# Patient Record
Sex: Male | Born: 1950
Health system: Southern US, Community
[De-identification: ages and names within clinical notes are randomized; demographics above are authoritative.]

## PROBLEM LIST (undated history)

## (undated) DIAGNOSIS — F329 Major depressive disorder, single episode, unspecified: Secondary | ICD-10-CM

## (undated) DIAGNOSIS — I10 Essential (primary) hypertension: Secondary | ICD-10-CM

## (undated) DIAGNOSIS — C61 Malignant neoplasm of prostate: Secondary | ICD-10-CM

## (undated) DIAGNOSIS — F32A Depression, unspecified: Secondary | ICD-10-CM

## (undated) DIAGNOSIS — E7800 Pure hypercholesterolemia, unspecified: Secondary | ICD-10-CM

## (undated) DIAGNOSIS — G35 Multiple sclerosis: Secondary | ICD-10-CM

## (undated) HISTORY — DX: Essential (primary) hypertension: I10

## (undated) HISTORY — DX: Multiple sclerosis: G35

## (undated) HISTORY — DX: Pure hypercholesterolemia, unspecified: E78.00

## (undated) HISTORY — PX: KNEE SURGERY: SHX244

## (undated) HISTORY — DX: Malignant neoplasm of prostate: C61

## (undated) HISTORY — DX: Major depressive disorder, single episode, unspecified: F32.9

## (undated) HISTORY — DX: Depression, unspecified: F32.A

---

## 2006-07-31 ENCOUNTER — Ambulatory Visit: Payer: Self-pay | Admitting: Unknown Physician Specialty

## 2006-10-16 ENCOUNTER — Ambulatory Visit: Payer: Self-pay | Admitting: Unknown Physician Specialty

## 2007-06-02 ENCOUNTER — Emergency Department: Payer: Self-pay | Admitting: Emergency Medicine

## 2009-02-14 ENCOUNTER — Ambulatory Visit: Payer: Self-pay

## 2009-05-04 ENCOUNTER — Ambulatory Visit: Payer: Self-pay | Admitting: Urology

## 2009-05-24 ENCOUNTER — Ambulatory Visit: Payer: Self-pay | Admitting: Radiation Oncology

## 2009-06-10 ENCOUNTER — Ambulatory Visit: Payer: Self-pay | Admitting: Radiation Oncology

## 2009-06-24 ENCOUNTER — Ambulatory Visit: Payer: Self-pay | Admitting: Radiation Oncology

## 2009-07-05 ENCOUNTER — Ambulatory Visit: Payer: Self-pay | Admitting: Radiation Oncology

## 2009-07-12 ENCOUNTER — Ambulatory Visit: Payer: Self-pay | Admitting: Radiation Oncology

## 2009-07-24 ENCOUNTER — Ambulatory Visit: Payer: Self-pay | Admitting: Radiation Oncology

## 2009-07-26 ENCOUNTER — Ambulatory Visit: Payer: Self-pay | Admitting: Urology

## 2009-08-24 ENCOUNTER — Ambulatory Visit: Payer: Self-pay | Admitting: Radiation Oncology

## 2009-09-23 ENCOUNTER — Ambulatory Visit: Payer: Self-pay | Admitting: Radiation Oncology

## 2010-06-28 ENCOUNTER — Ambulatory Visit: Payer: Self-pay | Admitting: Internal Medicine

## 2010-12-07 DIAGNOSIS — M21969 Unspecified acquired deformity of unspecified lower leg: Secondary | ICD-10-CM | POA: Insufficient documentation

## 2011-03-01 ENCOUNTER — Ambulatory Visit: Payer: Self-pay | Admitting: Internal Medicine

## 2011-03-28 ENCOUNTER — Ambulatory Visit: Payer: Self-pay | Admitting: Internal Medicine

## 2011-03-28 DIAGNOSIS — C61 Malignant neoplasm of prostate: Secondary | ICD-10-CM | POA: Diagnosis not present

## 2011-04-04 ENCOUNTER — Ambulatory Visit: Payer: Self-pay | Admitting: Oncology

## 2011-04-10 ENCOUNTER — Ambulatory Visit: Payer: Self-pay | Admitting: Oncology

## 2011-04-10 DIAGNOSIS — M899 Disorder of bone, unspecified: Secondary | ICD-10-CM | POA: Diagnosis not present

## 2011-04-10 DIAGNOSIS — F329 Major depressive disorder, single episode, unspecified: Secondary | ICD-10-CM | POA: Diagnosis not present

## 2011-04-10 DIAGNOSIS — G35 Multiple sclerosis: Secondary | ICD-10-CM | POA: Diagnosis not present

## 2011-04-10 DIAGNOSIS — Z79899 Other long term (current) drug therapy: Secondary | ICD-10-CM | POA: Diagnosis not present

## 2011-04-10 DIAGNOSIS — I10 Essential (primary) hypertension: Secondary | ICD-10-CM | POA: Diagnosis not present

## 2011-04-10 DIAGNOSIS — Z8546 Personal history of malignant neoplasm of prostate: Secondary | ICD-10-CM | POA: Diagnosis not present

## 2011-04-10 LAB — COMPREHENSIVE METABOLIC PANEL
Alkaline Phosphatase: 101 U/L (ref 50–136)
Anion Gap: 7 (ref 7–16)
Bilirubin,Total: 0.5 mg/dL (ref 0.2–1.0)
Calcium, Total: 9.2 mg/dL (ref 8.5–10.1)
Chloride: 101 mmol/L (ref 98–107)
Co2: 31 mmol/L (ref 21–32)
Creatinine: 1.33 mg/dL — ABNORMAL HIGH (ref 0.60–1.30)
EGFR (African American): >60
EGFR (Non-African Amer.): 58 — ABNORMAL LOW
Glucose: 87 mg/dL (ref 65–99)
Osmolality: 277 (ref 275–301)
Sodium: 139 mmol/L (ref 136–145)
Total Protein: 7.1 g/dL (ref 6.4–8.2)

## 2011-04-10 LAB — CBC CANCER CENTER
Basophil #: 0 x10 3/mm (ref 0.0–0.1)
Eosinophil #: 0 x10 3/mm (ref 0.0–0.7)
Eosinophil %: 0.3 %
Lymphocyte #: 1 x10 3/mm (ref 1.0–3.6)
Lymphocyte %: 16.7 %
MCHC: 34.4 g/dL (ref 32.0–36.0)
MCV: 89 fL (ref 80–100)
Monocyte %: 10.7 %
Neutrophil %: 72.3 %
Platelet: 230 x10 3/mm (ref 150–440)
RBC: 4.78 10*6/uL (ref 4.40–5.90)
RDW: 13 % (ref 11.5–14.5)
WBC: 5.8 x10 3/mm (ref 3.8–10.6)

## 2011-04-11 LAB — PSA: PSA: 2.6 ng/mL (ref 0.0–4.0)

## 2011-04-27 ENCOUNTER — Ambulatory Visit: Payer: Self-pay | Admitting: Oncology

## 2011-05-02 DIAGNOSIS — E7800 Pure hypercholesterolemia, unspecified: Secondary | ICD-10-CM | POA: Diagnosis not present

## 2011-05-02 DIAGNOSIS — I10 Essential (primary) hypertension: Secondary | ICD-10-CM | POA: Diagnosis not present

## 2011-05-02 DIAGNOSIS — R634 Abnormal weight loss: Secondary | ICD-10-CM | POA: Diagnosis not present

## 2011-05-02 DIAGNOSIS — R5381 Other malaise: Secondary | ICD-10-CM | POA: Diagnosis not present

## 2011-05-02 DIAGNOSIS — D510 Vitamin B12 deficiency anemia due to intrinsic factor deficiency: Secondary | ICD-10-CM | POA: Diagnosis not present

## 2011-06-04 DIAGNOSIS — C61 Malignant neoplasm of prostate: Secondary | ICD-10-CM | POA: Diagnosis not present

## 2011-06-04 DIAGNOSIS — R339 Retention of urine, unspecified: Secondary | ICD-10-CM | POA: Diagnosis not present

## 2011-06-04 DIAGNOSIS — N318 Other neuromuscular dysfunction of bladder: Secondary | ICD-10-CM | POA: Diagnosis not present

## 2011-06-04 DIAGNOSIS — N139 Obstructive and reflux uropathy, unspecified: Secondary | ICD-10-CM | POA: Diagnosis not present

## 2011-08-15 ENCOUNTER — Ambulatory Visit: Payer: Self-pay | Admitting: Oncology

## 2011-08-15 DIAGNOSIS — Z8546 Personal history of malignant neoplasm of prostate: Secondary | ICD-10-CM | POA: Diagnosis not present

## 2011-08-15 DIAGNOSIS — Z79899 Other long term (current) drug therapy: Secondary | ICD-10-CM | POA: Diagnosis not present

## 2011-08-15 DIAGNOSIS — G35 Multiple sclerosis: Secondary | ICD-10-CM | POA: Diagnosis not present

## 2011-08-15 DIAGNOSIS — M899 Disorder of bone, unspecified: Secondary | ICD-10-CM | POA: Diagnosis not present

## 2011-08-15 DIAGNOSIS — M949 Disorder of cartilage, unspecified: Secondary | ICD-10-CM | POA: Diagnosis not present

## 2011-08-15 DIAGNOSIS — F329 Major depressive disorder, single episode, unspecified: Secondary | ICD-10-CM | POA: Diagnosis not present

## 2011-08-15 DIAGNOSIS — I10 Essential (primary) hypertension: Secondary | ICD-10-CM | POA: Diagnosis not present

## 2011-08-15 LAB — CBC CANCER CENTER
Basophil %: 0.5 %
Eosinophil %: 0.3 %
HCT: 42.8 % (ref 40.0–52.0)
HGB: 14.2 g/dL (ref 13.0–18.0)
Lymphocyte #: 0.8 x10 3/mm — ABNORMAL LOW (ref 1.0–3.6)
MCHC: 33.1 g/dL (ref 32.0–36.0)
MCV: 90 fL (ref 80–100)
Monocyte #: 0.6 x10 3/mm (ref 0.2–1.0)
Monocyte %: 9.8 %
Neutrophil #: 4.2 x10 3/mm (ref 1.4–6.5)
Neutrophil %: 74.7 %
Platelet: 220 x10 3/mm (ref 150–440)
RBC: 4.76 10*6/uL (ref 4.40–5.90)
RDW: 13 % (ref 11.5–14.5)
WBC: 5.7 x10 3/mm (ref 3.8–10.6)

## 2011-08-15 LAB — COMPREHENSIVE METABOLIC PANEL
Albumin: 3.5 g/dL (ref 3.4–5.0)
Alkaline Phosphatase: 92 U/L (ref 50–136)
Anion Gap: 9 (ref 7–16)
BUN: 14 mg/dL (ref 7–18)
Bilirubin,Total: 0.4 mg/dL (ref 0.2–1.0)
Calcium, Total: 9 mg/dL (ref 8.5–10.1)
Co2: 30 mmol/L (ref 21–32)
EGFR (African American): >60
Glucose: 69 mg/dL (ref 65–99)
Osmolality: 282 (ref 275–301)
Potassium: 3.3 mmol/L — ABNORMAL LOW (ref 3.5–5.1)
SGPT (ALT): 20 U/L
Sodium: 142 mmol/L (ref 136–145)
Total Protein: 7.1 g/dL (ref 6.4–8.2)

## 2011-08-16 LAB — PSA: PSA: 1.3 ng/mL (ref 0.0–4.0)

## 2011-08-22 DIAGNOSIS — Z79899 Other long term (current) drug therapy: Secondary | ICD-10-CM | POA: Diagnosis not present

## 2011-08-22 DIAGNOSIS — E7800 Pure hypercholesterolemia, unspecified: Secondary | ICD-10-CM | POA: Diagnosis not present

## 2011-08-22 DIAGNOSIS — I10 Essential (primary) hypertension: Secondary | ICD-10-CM | POA: Diagnosis not present

## 2011-08-25 ENCOUNTER — Ambulatory Visit: Payer: Self-pay | Admitting: Oncology

## 2011-08-29 DIAGNOSIS — E7800 Pure hypercholesterolemia, unspecified: Secondary | ICD-10-CM | POA: Diagnosis not present

## 2011-08-29 DIAGNOSIS — D510 Vitamin B12 deficiency anemia due to intrinsic factor deficiency: Secondary | ICD-10-CM | POA: Diagnosis not present

## 2011-08-29 DIAGNOSIS — I10 Essential (primary) hypertension: Secondary | ICD-10-CM | POA: Diagnosis not present

## 2011-10-09 ENCOUNTER — Emergency Department: Payer: Self-pay | Admitting: Internal Medicine

## 2011-10-09 DIAGNOSIS — R5383 Other fatigue: Secondary | ICD-10-CM | POA: Diagnosis not present

## 2011-10-09 DIAGNOSIS — T6591XA Toxic effect of unspecified substance, accidental (unintentional), initial encounter: Secondary | ICD-10-CM | POA: Diagnosis not present

## 2011-10-09 DIAGNOSIS — Z79899 Other long term (current) drug therapy: Secondary | ICD-10-CM | POA: Diagnosis not present

## 2011-10-09 DIAGNOSIS — I10 Essential (primary) hypertension: Secondary | ICD-10-CM | POA: Diagnosis not present

## 2011-10-09 DIAGNOSIS — G35 Multiple sclerosis: Secondary | ICD-10-CM | POA: Diagnosis not present

## 2011-10-09 DIAGNOSIS — R4789 Other speech disturbances: Secondary | ICD-10-CM | POA: Diagnosis not present

## 2011-10-09 DIAGNOSIS — R6889 Other general symptoms and signs: Secondary | ICD-10-CM | POA: Diagnosis not present

## 2011-10-09 DIAGNOSIS — T887XXA Unspecified adverse effect of drug or medicament, initial encounter: Secondary | ICD-10-CM | POA: Diagnosis not present

## 2011-10-09 LAB — URINALYSIS, COMPLETE
Bilirubin,UR: NEGATIVE
Blood: NEGATIVE
Glucose,UR: NEGATIVE mg/dL (ref 0–75)
Ketone: NEGATIVE
Nitrite: NEGATIVE
Ph: 7 (ref 4.5–8.0)
Specific Gravity: 1.016 (ref 1.003–1.030)
Squamous Epithelial: <1
WBC UR: <1 /HPF (ref 0–5)

## 2011-10-09 LAB — COMPREHENSIVE METABOLIC PANEL
Anion Gap: 7 (ref 7–16)
BUN: 12 mg/dL (ref 7–18)
Bilirubin,Total: 0.6 mg/dL (ref 0.2–1.0)
Calcium, Total: 9 mg/dL (ref 8.5–10.1)
Chloride: 101 mmol/L (ref 98–107)
Co2: 31 mmol/L (ref 21–32)
Creatinine: 1.2 mg/dL (ref 0.60–1.30)
Glucose: 85 mg/dL (ref 65–99)
Osmolality: 277 (ref 275–301)
Potassium: 3.9 mmol/L (ref 3.5–5.1)
Sodium: 139 mmol/L (ref 136–145)

## 2011-10-09 LAB — CBC
MCH: 32.5 pg (ref 26.0–34.0)
MCV: 93 fL (ref 80–100)
Platelet: 226 10*3/uL (ref 150–440)
RDW: 13.2 % (ref 11.5–14.5)
WBC: 5.1 10*3/uL (ref 3.8–10.6)

## 2011-10-09 LAB — DRUG SCREEN, URINE
Amphetamines, Ur Screen: NEGATIVE (ref ?–1000)
Barbiturates, Ur Screen: NEGATIVE (ref ?–200)
Cannabinoid 50 Ng, Ur ~~LOC~~: NEGATIVE (ref ?–50)
Cocaine Metabolite,Ur ~~LOC~~: NEGATIVE (ref ?–300)
Methadone, Ur Screen: NEGATIVE (ref ?–300)
Opiate, Ur Screen: NEGATIVE (ref ?–300)
Tricyclic, Ur Screen: NEGATIVE (ref ?–1000)

## 2011-10-09 LAB — CK TOTAL AND CKMB (NOT AT ARMC)
CK, Total: 107 U/L (ref 35–232)
CK-MB: 1.8 ng/mL (ref 0.5–3.6)

## 2011-10-09 LAB — TROPONIN I: Troponin-I: <0.02 ng/mL

## 2011-10-10 DIAGNOSIS — R339 Retention of urine, unspecified: Secondary | ICD-10-CM | POA: Diagnosis not present

## 2011-10-10 DIAGNOSIS — G35 Multiple sclerosis: Secondary | ICD-10-CM | POA: Diagnosis not present

## 2011-10-10 DIAGNOSIS — N318 Other neuromuscular dysfunction of bladder: Secondary | ICD-10-CM | POA: Diagnosis not present

## 2011-10-10 DIAGNOSIS — C61 Malignant neoplasm of prostate: Secondary | ICD-10-CM | POA: Diagnosis not present

## 2011-11-29 DIAGNOSIS — G35 Multiple sclerosis: Secondary | ICD-10-CM | POA: Diagnosis not present

## 2011-12-28 ENCOUNTER — Encounter: Payer: Self-pay | Admitting: Internal Medicine

## 2011-12-28 ENCOUNTER — Ambulatory Visit (INDEPENDENT_AMBULATORY_CARE_PROVIDER_SITE_OTHER): Payer: Medicare Other | Admitting: Internal Medicine

## 2011-12-28 VITALS — BP 141/88 | HR 80 | Temp 97.8°F | Ht 68.5 in | Wt 139.8 lb

## 2011-12-28 DIAGNOSIS — I10 Essential (primary) hypertension: Secondary | ICD-10-CM | POA: Diagnosis not present

## 2011-12-28 DIAGNOSIS — Z23 Encounter for immunization: Secondary | ICD-10-CM | POA: Diagnosis not present

## 2011-12-28 DIAGNOSIS — E7800 Pure hypercholesterolemia, unspecified: Secondary | ICD-10-CM | POA: Diagnosis not present

## 2011-12-28 DIAGNOSIS — C61 Malignant neoplasm of prostate: Secondary | ICD-10-CM | POA: Diagnosis not present

## 2011-12-28 DIAGNOSIS — E538 Deficiency of other specified B group vitamins: Secondary | ICD-10-CM | POA: Diagnosis not present

## 2011-12-28 DIAGNOSIS — G35 Multiple sclerosis: Secondary | ICD-10-CM | POA: Insufficient documentation

## 2011-12-28 MED ORDER — CYANOCOBALAMIN 1000 MCG/ML IJ SOLN
1000.0000 ug | Freq: Once | INTRAMUSCULAR | Status: AC
  Administered 2011-12-28: 1000 ug via INTRAMUSCULAR

## 2011-12-28 NOTE — Assessment & Plan Note (Signed)
Continues monthly B12 injections.    

## 2011-12-28 NOTE — Patient Instructions (Signed)
It was good seeing you today.  Blood pressure on recheck - looked great.  We will schedule your follow up labs with your next B12 shot.

## 2011-12-28 NOTE — Assessment & Plan Note (Signed)
Blood pressure on recheck - looks good.  Continue same meds.  Will check met b with his next labs.    

## 2011-12-28 NOTE — Assessment & Plan Note (Signed)
Sees Dr Emily Pharr.  Continues with physical therapy.  

## 2011-12-28 NOTE — Assessment & Plan Note (Signed)
Followed by Dr Achilles Dunk and Dr Doylene Canning.  Dr Achilles Dunk follows his PSA.  Has follow up this month.

## 2011-12-28 NOTE — Assessment & Plan Note (Signed)
Continue Zetia.  Unable to take statins.  Low cholesterol diet.  Check lipid profile and liver panel - next labs.   

## 2011-12-28 NOTE — Progress Notes (Signed)
  Subjective:    Patient ID: Philip Gordon, male    DOB: 22-May-1950, 61 y.o.   MRN: [id number]  HPI 61 year old male with past history of multiple sclerosis, hypertension and hypercholesterolemia.  He comes in today for a scheduled follow up.  He has been doing relatively well.  Eating better.  He reports his weight has increased.  Has some issues with right posterior shoulder pain.  Seeing neurology and going to PT.  Has muscle relaxer - takes regularly.  Never started the Prozac that his neurologist prescribed.  Desires not to start.  States she prescribed this for his pain.  He plans to discuss this with her at his next appt.    Past Medical History  Diagnosis Date  . Hypertension   . Hypercholesterolemia   . Multiple sclerosis     Sees Dr Harlen [facility])  . Prostate cancer     Followed by Dr Achilles Dunk and Dr Doylene Canning  . Depression     Review of Systems Patient denies any headache, lightheadedness or dizziness.  No chest pain, tightness or palpatations. No increased shortness of breath, cough or congestion.  No nausea or vomiting.  No abdominal pain or cramping.  No bowel change, such as diarrhea, constipation, BRBPR or melana.  No urine change. Only drinking one to two beers per week now.  Feels better.        Objective:   Physical Exam Filed Vitals:   12/28/11 1318  BP: 141/88  Pulse: 80  Temp: 97.8 F (36.6 C)   Blood pressure recheck:  105/13  61 year old male in no acute distress.   HEENT:  Nares - clear.  OP- without lesions or erythema.  NECK:  Supple, nontender.  No audible carotid bruit.   HEART:  Appears to be regular. LUNGS:  Without crackles or wheezing audible.  Respirations even and unlabored.   RADIAL PULSE:  Equal bilaterally.  ABDOMEN:  Soft, nontender.   EXTREMITIES:  No increased edema to be present.                    Assessment & Plan:

## 2012-01-02 ENCOUNTER — Ambulatory Visit: Payer: Self-pay | Admitting: Internal Medicine

## 2012-01-07 ENCOUNTER — Ambulatory Visit: Payer: Self-pay | Admitting: Unknown Physician Specialty

## 2012-01-07 DIAGNOSIS — Z8546 Personal history of malignant neoplasm of prostate: Secondary | ICD-10-CM | POA: Diagnosis not present

## 2012-01-07 DIAGNOSIS — F17200 Nicotine dependence, unspecified, uncomplicated: Secondary | ICD-10-CM | POA: Diagnosis not present

## 2012-01-07 DIAGNOSIS — Z860100 Personal history of colon polyps, unspecified: Secondary | ICD-10-CM | POA: Diagnosis not present

## 2012-01-07 DIAGNOSIS — D126 Benign neoplasm of colon, unspecified: Secondary | ICD-10-CM | POA: Diagnosis not present

## 2012-01-07 DIAGNOSIS — I10 Essential (primary) hypertension: Secondary | ICD-10-CM | POA: Diagnosis not present

## 2012-01-07 DIAGNOSIS — Z09 Encounter for follow-up examination after completed treatment for conditions other than malignant neoplasm: Secondary | ICD-10-CM | POA: Diagnosis not present

## 2012-01-07 DIAGNOSIS — Z79899 Other long term (current) drug therapy: Secondary | ICD-10-CM | POA: Diagnosis not present

## 2012-01-07 DIAGNOSIS — K648 Other hemorrhoids: Secondary | ICD-10-CM | POA: Diagnosis not present

## 2012-01-09 LAB — PATHOLOGY REPORT

## 2012-01-16 DIAGNOSIS — G35 Multiple sclerosis: Secondary | ICD-10-CM | POA: Insufficient documentation

## 2012-01-16 DIAGNOSIS — C61 Malignant neoplasm of prostate: Secondary | ICD-10-CM | POA: Insufficient documentation

## 2012-01-16 DIAGNOSIS — Z8546 Personal history of malignant neoplasm of prostate: Secondary | ICD-10-CM | POA: Insufficient documentation

## 2012-01-16 DIAGNOSIS — R339 Retention of urine, unspecified: Secondary | ICD-10-CM | POA: Insufficient documentation

## 2012-01-16 DIAGNOSIS — N319 Neuromuscular dysfunction of bladder, unspecified: Secondary | ICD-10-CM | POA: Insufficient documentation

## 2012-03-12 DIAGNOSIS — Z85828 Personal history of other malignant neoplasm of skin: Secondary | ICD-10-CM | POA: Diagnosis not present

## 2012-03-12 DIAGNOSIS — L723 Sebaceous cyst: Secondary | ICD-10-CM | POA: Diagnosis not present

## 2012-03-12 DIAGNOSIS — D485 Neoplasm of uncertain behavior of skin: Secondary | ICD-10-CM | POA: Diagnosis not present

## 2012-03-31 DIAGNOSIS — L570 Actinic keratosis: Secondary | ICD-10-CM | POA: Diagnosis not present

## 2012-03-31 DIAGNOSIS — L723 Sebaceous cyst: Secondary | ICD-10-CM | POA: Diagnosis not present

## 2012-03-31 DIAGNOSIS — Z0189 Encounter for other specified special examinations: Secondary | ICD-10-CM | POA: Diagnosis not present

## 2012-03-31 DIAGNOSIS — L819 Disorder of pigmentation, unspecified: Secondary | ICD-10-CM | POA: Diagnosis not present

## 2012-03-31 DIAGNOSIS — L821 Other seborrheic keratosis: Secondary | ICD-10-CM | POA: Diagnosis not present

## 2012-04-16 DIAGNOSIS — N139 Obstructive and reflux uropathy, unspecified: Secondary | ICD-10-CM | POA: Diagnosis not present

## 2012-04-16 DIAGNOSIS — N318 Other neuromuscular dysfunction of bladder: Secondary | ICD-10-CM | POA: Diagnosis not present

## 2012-04-16 DIAGNOSIS — C61 Malignant neoplasm of prostate: Secondary | ICD-10-CM | POA: Diagnosis not present

## 2012-04-16 DIAGNOSIS — R339 Retention of urine, unspecified: Secondary | ICD-10-CM | POA: Diagnosis not present

## 2012-04-28 ENCOUNTER — Other Ambulatory Visit: Payer: Self-pay | Admitting: Internal Medicine

## 2012-04-28 MED ORDER — EZETIMIBE 10 MG PO TABS: 10.0000 mg | ORAL_TABLET | Freq: Every day | ORAL | Status: DC

## 2012-04-28 MED ORDER — AMLODIPINE BESYLATE 5 MG PO TABS: 5.0000 mg | ORAL_TABLET | Freq: Every day | ORAL | Status: DC

## 2012-04-28 MED ORDER — HYDROCHLOROTHIAZIDE 12.5 MG PO CAPS: 12.5000 mg | ORAL_CAPSULE | Freq: Every day | ORAL | Status: DC

## 2012-04-28 NOTE — Telephone Encounter (Signed)
Sent in to pharmacy.  

## 2012-04-28 NOTE — Telephone Encounter (Signed)
Pt is needing refill on Amlodipine, Zetia and Hydrochlorot. He uses [employer] 3 month supply

## 2012-04-29 ENCOUNTER — Ambulatory Visit: Payer: Medicare Other | Admitting: Internal Medicine

## 2012-04-30 ENCOUNTER — Other Ambulatory Visit: Payer: Self-pay | Admitting: *Deleted

## 2012-04-30 NOTE — Telephone Encounter (Signed)
Refill Request  Hydrochlorot tab 12.5mg   Take 1 tablet once daily

## 2012-05-01 NOTE — Telephone Encounter (Signed)
Already sent in to pharmacy. 

## 2012-05-06 ENCOUNTER — Telehealth: Payer: Self-pay | Admitting: Internal Medicine

## 2012-05-06 NOTE — Telephone Encounter (Signed)
Pt would like to get meds today if possible

## 2012-05-06 NOTE — Telephone Encounter (Signed)
Pt called he would like the [facility]lopine [facility] to [facility] [address]  He needs enough till the mail order comes in.  Pt has 3 days left

## 2012-05-07 MED ORDER — AMLODIPINE BESYLATE 5 MG PO TABS: 5.0000 mg | ORAL_TABLET | Freq: Every day | ORAL | Status: DC

## 2012-05-07 NOTE — Telephone Encounter (Signed)
Sent in to pharmacy.  

## 2012-05-08 ENCOUNTER — Ambulatory Visit: Payer: Medicare Other | Admitting: Internal Medicine

## 2012-05-15 ENCOUNTER — Ambulatory Visit (INDEPENDENT_AMBULATORY_CARE_PROVIDER_SITE_OTHER): Payer: Medicare Other | Admitting: Internal Medicine

## 2012-05-15 ENCOUNTER — Encounter: Payer: Self-pay | Admitting: Internal Medicine

## 2012-05-15 VITALS — BP 140/80 | HR 59 | Temp 98.1°F | Ht 68.5 in | Wt 137.0 lb

## 2012-05-15 DIAGNOSIS — E538 Deficiency of other specified B group vitamins: Secondary | ICD-10-CM | POA: Diagnosis not present

## 2012-05-15 DIAGNOSIS — I10 Essential (primary) hypertension: Secondary | ICD-10-CM | POA: Diagnosis not present

## 2012-05-15 DIAGNOSIS — E7800 Pure hypercholesterolemia, unspecified: Secondary | ICD-10-CM | POA: Diagnosis not present

## 2012-05-15 DIAGNOSIS — C61 Malignant neoplasm of prostate: Secondary | ICD-10-CM

## 2012-05-15 DIAGNOSIS — G35 Multiple sclerosis: Secondary | ICD-10-CM

## 2012-05-15 LAB — LIPID PANEL
Cholesterol: 214 mg/dL — ABNORMAL HIGH (ref 0–200)
HDL: 56.4 mg/dL (ref >39.00)
Triglycerides: 132 mg/dL (ref 0.0–149.0)

## 2012-05-15 LAB — HEPATIC FUNCTION PANEL
ALT: 14 U/L (ref 0–53)
Albumin: 4.1 g/dL (ref 3.5–5.2)
Total Protein: 7.3 g/dL (ref 6.0–8.3)

## 2012-05-15 MED ORDER — AMLODIPINE BESYLATE 5 MG PO TABS: 5.0000 mg | ORAL_TABLET | Freq: Every day | ORAL | Status: DC

## 2012-05-16 ENCOUNTER — Encounter: Payer: Self-pay | Admitting: Internal Medicine

## 2012-05-16 LAB — BASIC METABOLIC PANEL WITH GFR
CO2: 28 mEq/L (ref 19–32)
Calcium: 9.5 mg/dL (ref 8.4–10.5)
Creat: 1.06 mg/dL (ref 0.50–1.35)
GFR, Est African American: 86 mL/min
Glucose, Bld: 76 mg/dL (ref 70–99)

## 2012-05-25 ENCOUNTER — Encounter: Payer: Self-pay | Admitting: Internal Medicine

## 2012-05-25 NOTE — Assessment & Plan Note (Signed)
Continues monthly B12 injections.

## 2012-05-25 NOTE — Assessment & Plan Note (Signed)
Blood pressure on recheck - looks good.  Continue same meds.  Will check met b with his next labs.

## 2012-05-25 NOTE — Assessment & Plan Note (Signed)
Sees Dr [facility].  Continues with physical therapy.

## 2012-05-25 NOTE — Assessment & Plan Note (Signed)
Continue Zetia.  Unable to take statins.  Low cholesterol diet.  Check lipid profile and liver panel - next labs.   

## 2012-05-25 NOTE — Assessment & Plan Note (Signed)
Followed by Dr Achilles Dunk and Dr Doylene Canning.  Dr Achilles Dunk follows his PSA.  Last psa .3.

## 2012-05-25 NOTE — Progress Notes (Signed)
  Subjective:    Patient ID: Philip Gordon, male    DOB: November 21, 1950, 62 y.o.   MRN: [id number]  HPI 62 year old male with past history of multiple sclerosis, hypertension and hypercholesterolemia.  He comes in today for a scheduled follow up.  He has been doing relatively well.  Eating better.  He reports his weight has increased.  Has some issues with right posterior shoulder pain.  Seeing neurology and going to PT.  Has muscle relaxer - takes regularly.  Seeing Dr Achilles Dunk.  PSA .3.  Breathing stable.  No drinking.  Bowels stable.    Past Medical History  Diagnosis Date  . Hypertension   . Hypercholesterolemia   . Multiple sclerosis     Sees Dr [facility]len [facility])  . Prostate cancer     Followed by Dr Achilles Dunk and Dr Doylene Canning  . Depression     Current Outpatient Prescriptions on File Prior to Visit  Medication Sig Dispense Refill  . ALPRAZolam (XANAX) 0.5 MG tablet Take 0.5 mg by mouth at bedtime as needed.      . dalfampridine (AMPYRA) 10 MG TB12 Take 10 mg by mouth 2 (two) times daily.       Marland Kitchen ezetimibe (ZETIA) 10 MG tablet Take 1 tablet (10 mg total) by mouth daily.  90 tablet  1  . finasteride (PROSCAR) 5 MG tablet Take 5 mg by mouth daily.      . hydrochlorothiazide (MICROZIDE) 12.5 MG capsule Take 1 capsule (12.5 mg total) by mouth daily.  90 capsule  1  . tiZANidine (ZANAFLEX) 4 MG capsule Take 4 mg by mouth at bedtime.       Marland Kitchen FLUoxetine (PROZAC) 20 MG tablet Take 20 mg by mouth daily.       No current facility-administered medications on file prior to visit.    Review of Systems Patient denies any headache, lightheadedness or dizziness.  No sinus or allergy symptoms.  No chest pain, tightness or palpitations. No increased shortness of breath, cough or congestion.  No nausea or vomiting.  No acid reflux. No abdominal pain or cramping.  No bowel change, such as diarrhea, constipation, BRBPR or melana.  No urine change.   Feels better.        Objective:   Physical Exam  Filed  Vitals:   05/15/12 1111  BP: 140/80  Pulse: 59  Temp: 98.1 F (36.7 C)   Blood pressure recheck:  98/1  62 year old male in no acute distress.   HEENT:  Nares - clear.  OP- without lesions or erythema.  NECK:  Supple, nontender.  No audible carotid bruit.   HEART:  Appears to be regular. LUNGS:  Without crackles or wheezing audible.  Respirations even and unlabored.   RADIAL PULSE:  Equal bilaterally.  ABDOMEN:  Soft, nontender.   EXTREMITIES:  No increased edema to be present.                   Assessment & Plan:  HEALTH MAINTENANCE.  Need to get outside records.  Keep up to date with his physicals.  Prostate (psa) being followed by Dr Achilles Dunk.

## 2012-07-02 DIAGNOSIS — H2510 Age-related nuclear cataract, unspecified eye: Secondary | ICD-10-CM | POA: Diagnosis not present

## 2012-07-09 NOTE — Telephone Encounter (Signed)
Please close encounter

## 2012-07-16 ENCOUNTER — Telehealth: Payer: Self-pay | Admitting: Internal Medicine

## 2012-07-16 NOTE — Telephone Encounter (Signed)
Pt dropped of [employer] forms to be filled out  In box

## 2012-07-21 ENCOUNTER — Telehealth: Payer: Self-pay | Admitting: *Deleted

## 2012-07-21 NOTE — Telephone Encounter (Signed)
Left message for patient to pick up forms at the front desk (there is also a note on forms for pt to have Neurologist complete the remainder of the forms)

## 2012-07-24 ENCOUNTER — Ambulatory Visit (INDEPENDENT_AMBULATORY_CARE_PROVIDER_SITE_OTHER): Payer: Medicare Other | Admitting: *Deleted

## 2012-07-24 DIAGNOSIS — E538 Deficiency of other specified B group vitamins: Secondary | ICD-10-CM

## 2012-07-24 MED ORDER — CYANOCOBALAMIN 1000 MCG/ML IJ SOLN
1000.0000 ug | Freq: Once | INTRAMUSCULAR | Status: AC
  Administered 2012-07-24: 1000 ug via INTRAMUSCULAR

## 2012-07-30 DIAGNOSIS — G35 Multiple sclerosis: Secondary | ICD-10-CM | POA: Diagnosis not present

## 2012-10-15 DIAGNOSIS — N139 Obstructive and reflux uropathy, unspecified: Secondary | ICD-10-CM | POA: Diagnosis not present

## 2012-10-15 DIAGNOSIS — N320 Bladder-neck obstruction: Secondary | ICD-10-CM | POA: Insufficient documentation

## 2012-10-15 DIAGNOSIS — C61 Malignant neoplasm of prostate: Secondary | ICD-10-CM | POA: Diagnosis not present

## 2012-10-15 DIAGNOSIS — R339 Retention of urine, unspecified: Secondary | ICD-10-CM | POA: Diagnosis not present

## 2012-10-15 DIAGNOSIS — N318 Other neuromuscular dysfunction of bladder: Secondary | ICD-10-CM | POA: Diagnosis not present

## 2012-10-27 ENCOUNTER — Other Ambulatory Visit: Payer: Self-pay | Admitting: Internal Medicine

## 2012-11-13 ENCOUNTER — Encounter: Payer: Medicare Other | Admitting: Internal Medicine

## 2012-12-01 ENCOUNTER — Other Ambulatory Visit: Payer: Self-pay | Admitting: *Deleted

## 2012-12-01 MED ORDER — HYDROCHLOROTHIAZIDE 12.5 MG PO CAPS: 12.5000 mg | ORAL_CAPSULE | Freq: Every day | ORAL | Status: DC

## 2012-12-01 NOTE — Telephone Encounter (Signed)
Spoke with pt- [facility] to [facility] [address].

## 2012-12-29 ENCOUNTER — Encounter: Payer: Self-pay | Admitting: Internal Medicine

## 2012-12-29 ENCOUNTER — Ambulatory Visit (INDEPENDENT_AMBULATORY_CARE_PROVIDER_SITE_OTHER): Payer: Medicare Other | Admitting: Internal Medicine

## 2012-12-29 VITALS — BP 120/80 | HR 73 | Temp 97.7°F | Ht 69.0 in | Wt 131.5 lb

## 2012-12-29 DIAGNOSIS — G35 Multiple sclerosis: Secondary | ICD-10-CM

## 2012-12-29 DIAGNOSIS — Z23 Encounter for immunization: Secondary | ICD-10-CM

## 2012-12-29 DIAGNOSIS — C61 Malignant neoplasm of prostate: Secondary | ICD-10-CM | POA: Diagnosis not present

## 2012-12-29 DIAGNOSIS — R634 Abnormal weight loss: Secondary | ICD-10-CM

## 2012-12-29 DIAGNOSIS — E7800 Pure hypercholesterolemia, unspecified: Secondary | ICD-10-CM

## 2012-12-29 DIAGNOSIS — E538 Deficiency of other specified B group vitamins: Secondary | ICD-10-CM | POA: Diagnosis not present

## 2012-12-29 DIAGNOSIS — I10 Essential (primary) hypertension: Secondary | ICD-10-CM

## 2012-12-29 MED ORDER — CYANOCOBALAMIN 1000 MCG/ML IJ SOLN
1000.0000 ug | Freq: Once | INTRAMUSCULAR | Status: AC
  Administered 2012-12-29: 1000 ug via INTRAMUSCULAR

## 2012-12-31 ENCOUNTER — Encounter: Payer: Self-pay | Admitting: Internal Medicine

## 2012-12-31 NOTE — Assessment & Plan Note (Signed)
Continue Zetia.  Unable to take statins.  Low cholesterol diet.  Check lipid profile and liver panel - next labs.   

## 2012-12-31 NOTE — Assessment & Plan Note (Signed)
Sees Dr [facility].  Continues with physical therapy.

## 2012-12-31 NOTE — Assessment & Plan Note (Signed)
Followed by Dr Achilles Dunk and Dr Doylene Canning.  Dr Achilles Dunk follows his PSA.

## 2012-12-31 NOTE — Assessment & Plan Note (Signed)
Blood pressure as outlined.  Doing well.  Same medication regimen.  Follow.   

## 2012-12-31 NOTE — Assessment & Plan Note (Signed)
Continue B12 injections.  Given today.  Stressed he needs to come regularly.     

## 2012-12-31 NOTE — Progress Notes (Signed)
Subjective:    Patient ID: Philip Gordon, male    DOB: January 25, 1951, 62 y.o.   MRN: [id number]  HPI 62 year old male with past history of multiple sclerosis, hypertension and hypercholesterolemia.  He comes in today to follow up on these issues as well as for a complete physical exam.  He has been doing relatively well.  Weight is down more.  States he eats regular meals.  Do not feel he is eating as well since his mother passed away.   Has some issues with right posterior shoulder pain.  Seeing neurology and going to PT.  Has muscle relaxer - takes regularly.  Seeing Dr Achilles Dunk.  He is following him regarding his history of prostate cancer.  He is following psa.   Breathing stable.  Denies any alcohol intake.   Bowels stable.    Past Medical History  Diagnosis Date  . Hypertension   . Hypercholesterolemia   . Multiple sclerosis     Sees Dr Harlen [facility])  . Prostate cancer     Followed by Dr Achilles Dunk and Dr Doylene Canning  . Depression     Current Outpatient Prescriptions on File Prior to Visit  Medication Sig Dispense Refill  . ALPRAZolam (XANAX) 0.5 MG tablet Take 0.5 mg by mouth 3 (three) times daily as needed.       Marland Kitchen amLODipine (NORVASC) 5 MG tablet Take 1 tablet (5 mg total) by mouth daily.  14 tablet  0  . dalfampridine (AMPYRA) 10 MG TB12 Take 10 mg by mouth 2 (two) times daily.       . finasteride (PROSCAR) 5 MG tablet Take 5 mg by mouth daily.      . hydrochlorothiazide (MICROZIDE) 12.5 MG capsule Take 1 capsule (12.5 mg total) by mouth daily.  90 capsule  1  . tiZANidine (ZANAFLEX) 4 MG capsule Take 4 mg by mouth at bedtime.       Marland Kitchen ZETIA 10 MG tablet TAKE 1 TABLET DAILY  90 tablet  1  . cyanocobalamin (,VITAMIN B-12,) 1000 MCG/ML injection Inject 1,000 mcg into the muscle once.       No current facility-administered medications on file prior to visit.    Review of Systems Patient denies any headache, lightheadedness or dizziness.  No sinus or allergy symptoms.  No chest pain,  tightness or palpitations. No increased shortness of breath, cough or congestion.  No nausea or vomiting.  No acid reflux. No abdominal pain or cramping.  No bowel change, such as diarrhea, constipation, BRBPR or melana.  No urine change.   Feels overall things are stable.  Seeing neurology.          Objective:   Physical Exam  Filed Vitals:   12/29/12 1003  BP: 120/80  Pulse: 73  Temp: 97.7 F (36.5 C)   Blood pressure recheck:  118/78, pulse 22  62 year old male in no acute distress.  HEENT:  Nares - clear.  Oropharynx - without lesions. NECK:  Supple.  Nontender.  No audible carotid bruit.  HEART:  Appears to be regular.   LUNGS:  No crackles or wheezing audible.  Respirations even and unlabored.   RADIAL PULSE:  Equal bilaterally.  ABDOMEN:  Soft.  Nontender.  Bowel sounds present and normal.  No audible abdominal bruit.  GU:  Performed by urology.  EXTREMITIES:  No increased edema present.  DP pulses palpable and equal bilaterally.        Assessment & Plan:  HEALTH MAINTENANCE.  Physical today.  Needs colonoscopy.   Prostate (psa) being followed by Dr Achilles Dunk.

## 2013-01-05 ENCOUNTER — Encounter: Payer: Self-pay | Admitting: Internal Medicine

## 2013-01-06 ENCOUNTER — Encounter: Payer: Self-pay | Admitting: Internal Medicine

## 2013-01-12 ENCOUNTER — Encounter: Payer: Self-pay | Admitting: Internal Medicine

## 2013-01-30 ENCOUNTER — Ambulatory Visit (INDEPENDENT_AMBULATORY_CARE_PROVIDER_SITE_OTHER): Payer: Medicare Other | Admitting: *Deleted

## 2013-01-30 ENCOUNTER — Other Ambulatory Visit (INDEPENDENT_AMBULATORY_CARE_PROVIDER_SITE_OTHER): Payer: Medicare Other

## 2013-01-30 DIAGNOSIS — E7800 Pure hypercholesterolemia, unspecified: Secondary | ICD-10-CM | POA: Diagnosis not present

## 2013-01-30 DIAGNOSIS — I10 Essential (primary) hypertension: Secondary | ICD-10-CM | POA: Diagnosis not present

## 2013-01-30 DIAGNOSIS — E538 Deficiency of other specified B group vitamins: Secondary | ICD-10-CM

## 2013-01-30 DIAGNOSIS — R634 Abnormal weight loss: Secondary | ICD-10-CM

## 2013-01-30 LAB — LIPID PANEL
Cholesterol: 205 mg/dL — ABNORMAL HIGH (ref 0–200)
Total CHOL/HDL Ratio: 4
Triglycerides: 96 mg/dL (ref 0.0–149.0)
VLDL: 19.2 mg/dL (ref 0.0–40.0)

## 2013-01-30 LAB — CBC WITH DIFFERENTIAL/PLATELET
Basophils Absolute: 0 10*3/uL (ref 0.0–0.1)
Basophils Relative: 0.5 % (ref 0.0–3.0)
Eosinophils Absolute: 0 10*3/uL (ref 0.0–0.7)
Eosinophils Relative: 0.6 % (ref 0.0–5.0)
Hemoglobin: 15.5 g/dL (ref 13.0–17.0)
MCHC: 34.2 g/dL (ref 30.0–36.0)
MCV: 90.2 fl (ref 78.0–100.0)
Monocytes Absolute: 0.8 10*3/uL (ref 0.1–1.0)
Neutro Abs: 4.1 10*3/uL (ref 1.4–7.7)
Neutrophils Relative %: 69.1 % (ref 43.0–77.0)
RBC: 5.02 Mil/uL (ref 4.22–5.81)
RDW: 12.5 % (ref 11.5–14.6)
WBC: 5.9 10*3/uL (ref 4.5–10.5)

## 2013-01-30 LAB — BASIC METABOLIC PANEL
CO2: 32 mEq/L (ref 19–32)
Calcium: 9.5 mg/dL (ref 8.4–10.5)
Creatinine, Ser: 1.2 mg/dL (ref 0.4–1.5)
Glucose, Bld: 78 mg/dL (ref 70–99)
Potassium: 3.6 mEq/L (ref 3.5–5.1)
Sodium: 137 mEq/L (ref 135–145)

## 2013-01-30 LAB — HEPATIC FUNCTION PANEL
Alkaline Phosphatase: 70 U/L (ref 39–117)
Bilirubin, Direct: 0.1 mg/dL (ref 0.0–0.3)
Total Bilirubin: 0.9 mg/dL (ref 0.3–1.2)
Total Protein: 7 g/dL (ref 6.0–8.3)

## 2013-01-30 LAB — TSH: TSH: 1.78 u[IU]/mL (ref 0.35–5.50)

## 2013-01-30 LAB — LDL CHOLESTEROL, DIRECT: Direct LDL: 129.6 mg/dL

## 2013-01-30 MED ORDER — CYANOCOBALAMIN 1000 MCG/ML IJ SOLN
1000.0000 ug | Freq: Once | INTRAMUSCULAR | Status: AC
  Administered 2013-01-30: 1000 ug via INTRAMUSCULAR

## 2013-02-02 ENCOUNTER — Encounter: Payer: Self-pay | Admitting: *Deleted

## 2013-02-25 ENCOUNTER — Other Ambulatory Visit: Payer: Self-pay | Admitting: *Deleted

## 2013-02-25 MED ORDER — HYDROCHLOROTHIAZIDE 12.5 MG PO CAPS: 12.5000 mg | ORAL_CAPSULE | Freq: Every day | ORAL | Status: DC

## 2013-04-02 ENCOUNTER — Ambulatory Visit: Payer: Medicare Other | Admitting: Internal Medicine

## 2013-04-20 DIAGNOSIS — G35 Multiple sclerosis: Secondary | ICD-10-CM | POA: Diagnosis not present

## 2013-05-21 ENCOUNTER — Ambulatory Visit: Payer: Medicare Other | Admitting: Internal Medicine

## 2013-05-22 ENCOUNTER — Telehealth: Payer: Self-pay | Admitting: Emergency Medicine

## 2013-05-22 NOTE — Telephone Encounter (Signed)
Please schedule for Monday at 10:00 - block 63minutes.

## 2013-05-22 NOTE — Telephone Encounter (Signed)
Spoke with pt he needs afternoon  appointment

## 2013-05-22 NOTE — Telephone Encounter (Signed)
Pt was scheduled for f/u apt yesterday but we were out. Where can we place him without it being April?

## 2013-05-23 NOTE — Telephone Encounter (Signed)
I can see him on 06/23/13 at 11:45.

## 2013-05-25 ENCOUNTER — Ambulatory Visit: Payer: Medicare Other | Admitting: Internal Medicine

## 2013-05-25 NOTE — Telephone Encounter (Signed)
Conflict with 6/50 appointment  R/s to 07/06/13  Pt aware of appointment date and time change

## 2013-05-28 ENCOUNTER — Other Ambulatory Visit: Payer: Self-pay | Admitting: Internal Medicine

## 2013-06-23 ENCOUNTER — Ambulatory Visit: Payer: Medicare Other | Admitting: Internal Medicine

## 2013-06-23 DIAGNOSIS — N318 Other neuromuscular dysfunction of bladder: Secondary | ICD-10-CM | POA: Diagnosis not present

## 2013-06-23 DIAGNOSIS — G35 Multiple sclerosis: Secondary | ICD-10-CM | POA: Diagnosis not present

## 2013-06-23 DIAGNOSIS — R339 Retention of urine, unspecified: Secondary | ICD-10-CM | POA: Diagnosis not present

## 2013-06-23 DIAGNOSIS — C61 Malignant neoplasm of prostate: Secondary | ICD-10-CM | POA: Diagnosis not present

## 2013-06-30 ENCOUNTER — Telehealth: Payer: Self-pay | Admitting: Internal Medicine

## 2013-06-30 NOTE — Telephone Encounter (Signed)
Pt called asking for prescription refills.  Pt does not know specific medication names.  States he just needs all of them.  States he ran out of his last pill today.  States he needs 90 day supplies.  [facility] [address]

## 2013-07-01 NOTE — Telephone Encounter (Signed)
Not sure which medications needs refills. Pt will need to contact his pharmacy to see what refills are needed. I am showing that he has refills available on some of them. Patient must also keep his upcoming appointment for further refills. Left detailed voicemail on home phone

## 2013-07-02 DIAGNOSIS — H2510 Age-related nuclear cataract, unspecified eye: Secondary | ICD-10-CM | POA: Diagnosis not present

## 2013-07-06 ENCOUNTER — Encounter: Payer: Self-pay | Admitting: Internal Medicine

## 2013-07-06 ENCOUNTER — Ambulatory Visit (INDEPENDENT_AMBULATORY_CARE_PROVIDER_SITE_OTHER): Payer: Medicare Other | Admitting: Internal Medicine

## 2013-07-06 VITALS — BP 132/82 | HR 60 | Temp 97.7°F | Wt 140.5 lb

## 2013-07-06 DIAGNOSIS — E538 Deficiency of other specified B group vitamins: Secondary | ICD-10-CM

## 2013-07-06 DIAGNOSIS — E7800 Pure hypercholesterolemia, unspecified: Secondary | ICD-10-CM | POA: Diagnosis not present

## 2013-07-06 DIAGNOSIS — C61 Malignant neoplasm of prostate: Secondary | ICD-10-CM | POA: Diagnosis not present

## 2013-07-06 DIAGNOSIS — I10 Essential (primary) hypertension: Secondary | ICD-10-CM | POA: Diagnosis not present

## 2013-07-06 DIAGNOSIS — G35 Multiple sclerosis: Secondary | ICD-10-CM

## 2013-07-06 MED ORDER — CYANOCOBALAMIN 1000 MCG/ML IJ SOLN
1000.0000 ug | Freq: Once | INTRAMUSCULAR | Status: AC
  Administered 2013-07-06: 1000 ug via INTRAMUSCULAR

## 2013-07-06 MED ORDER — EZETIMIBE 10 MG PO TABS: 10.0000 mg | ORAL_TABLET | Freq: Every day | ORAL | Status: DC

## 2013-07-06 MED ORDER — AMLODIPINE BESYLATE 5 MG PO TABS: 5.0000 mg | ORAL_TABLET | Freq: Every day | ORAL | Status: DC

## 2013-07-06 NOTE — Progress Notes (Signed)
Pre visit review using our clinic review tool, if applicable. No additional management support is needed unless otherwise documented below in the visit note. 

## 2013-07-12 ENCOUNTER — Encounter: Payer: Self-pay | Admitting: Internal Medicine

## 2013-07-12 NOTE — Assessment & Plan Note (Signed)
Sees Dr Ala Bent.  Continues with physical therapy exercises.

## 2013-07-12 NOTE — Assessment & Plan Note (Signed)
Followed by Dr [name] and Dr [name].  Dr [name] follows his PSA.  Just checked and stable.

## 2013-07-12 NOTE — Assessment & Plan Note (Signed)
Continue B12 injections.  Given today.  Stressed he needs to come regularly.

## 2013-07-12 NOTE — Assessment & Plan Note (Signed)
Blood pressure as outlined.  Doing well.  Same medication regimen.  Follow.

## 2013-07-12 NOTE — Progress Notes (Signed)
Subjective:    Patient ID: Philip Gordon, male    DOB: Jan 08, 1951, 63 y.o.   MRN: [id number]  HPI 63 year old male with past history of multiple sclerosis, hypertension and hypercholesterolemia.  He comes in today to follow up on these issues as well as for a complete physical exam.  He has been doing relatively well.  Weight is up from last check.  States he eats regular meals.   Has some issues with right posterior shoulder pain.  Seeing neurology and going to PT.  Has muscle relaxer - takes regularly.  Seeing Dr [name].  He is following him regarding his history of prostate cancer.  He is following psa.   Just evaluated.  Stable.  Breathing stable.  Denies any alcohol intake.   Bowels stable.  Taking his medication regularly.     Past Medical History  Diagnosis Date  . Hypertension   . Hypercholesterolemia   . Multiple sclerosis     Sees Dr Ala Bent [facility])  . Prostate cancer     Followed by Dr [name] and Dr [name]  . Depression     Current Outpatient Prescriptions on File Prior to Visit  Medication Sig Dispense Refill  . ALPRAZolam (XANAX) 0.5 MG tablet Take 0.5 mg by mouth 3 (three) times daily as needed.       . cyanocobalamin (,VITAMIN B-12,) 1000 MCG/ML injection Inject 1,000 mcg into the muscle once.      Marland Kitchen dalfampridine (AMPYRA) 10 MG TB12 Take 10 mg by mouth 2 (two) times daily.       . finasteride (PROSCAR) 5 MG tablet Take 5 mg by mouth daily.      . hydrochlorothiazide (MICROZIDE) 12.5 MG capsule TAKE 1 CAPSULE (12.5 MG TOTAL) BY MOUTH DAILY.  90 capsule  1  . tiZANidine (ZANAFLEX) 4 MG capsule Take 4 mg by mouth at bedtime.        No current facility-administered medications on file prior to visit.    Review of Systems Patient denies any headache, lightheadedness or dizziness.  No sinus or allergy symptoms.  No chest pain, tightness or palpitations. No increased shortness of breath, cough or congestion.  No nausea or vomiting.  No acid reflux. No abdominal pain or  cramping.  No bowel change, such as diarrhea, constipation, BRBPR or melana.  No urine change.   Feels overall things are stable.  Seeing neurology for his MS and urology for prostate cancer.           Objective:   Physical Exam  Filed Vitals:   07/06/13 1157  BP: 132/82  Pulse: 60  Temp: 97.7 F (79.35 C)   63 year old male in no acute distress.  HEENT:  Nares - clear.  Oropharynx - without lesions. NECK:  Supple.  Nontender.  No audible carotid bruit.  HEART:  Appears to be regular.   LUNGS:  No crackles or wheezing audible.  Respirations even and unlabored.   RADIAL PULSE:  Equal bilaterally.  ABDOMEN:  Soft.  Nontender.  Bowel sounds present and normal.  No audible abdominal bruit.  GU:  Performed by urology.  EXTREMITIES:  No increased edema present.  DP pulses palpable and equal bilaterally.        Assessment & Plan:  HEALTH MAINTENANCE.  Physical today.  Needs colonoscopy.   Prostate (psa) being followed by Dr [name].    I spent 25 minutes with the patient and more than 50% of the time was spent in consultation regarding  the above.

## 2013-07-12 NOTE — Assessment & Plan Note (Signed)
Continue Zetia.  Unable to take statins.  Low cholesterol diet.  Check lipid profile and liver panel - next labs.

## 2013-08-05 ENCOUNTER — Ambulatory Visit (INDEPENDENT_AMBULATORY_CARE_PROVIDER_SITE_OTHER): Payer: Medicare Other | Admitting: *Deleted

## 2013-08-05 ENCOUNTER — Other Ambulatory Visit (INDEPENDENT_AMBULATORY_CARE_PROVIDER_SITE_OTHER): Payer: Medicare Other

## 2013-08-05 DIAGNOSIS — E7800 Pure hypercholesterolemia, unspecified: Secondary | ICD-10-CM | POA: Diagnosis not present

## 2013-08-05 DIAGNOSIS — I10 Essential (primary) hypertension: Secondary | ICD-10-CM

## 2013-08-05 DIAGNOSIS — E538 Deficiency of other specified B group vitamins: Secondary | ICD-10-CM | POA: Diagnosis not present

## 2013-08-05 LAB — LIPID PANEL
CHOLESTEROL: 205 mg/dL — AB (ref 0–200)
HDL: 45.8 mg/dL (ref >39.00)
LDL Cholesterol: 134 mg/dL — ABNORMAL HIGH (ref 0–99)
Total CHOL/HDL Ratio: 4
Triglycerides: 128 mg/dL (ref 0.0–149.0)
VLDL: 25.6 mg/dL (ref 0.0–40.0)

## 2013-08-05 LAB — BASIC METABOLIC PANEL
BUN: 17 mg/dL (ref 6–23)
CHLORIDE: 102 meq/L (ref 96–112)
CO2: 29 mEq/L (ref 19–32)
Calcium: 9.4 mg/dL (ref 8.4–10.5)
Creatinine, Ser: 1.3 mg/dL (ref 0.4–1.5)
GFR: 61.61 mL/min (ref >60.00)
Glucose, Bld: 83 mg/dL (ref 70–99)
Potassium: 3.2 mEq/L — ABNORMAL LOW (ref 3.5–5.1)
SODIUM: 138 meq/L (ref 135–145)

## 2013-08-05 LAB — HEPATIC FUNCTION PANEL
ALT: 11 U/L (ref 0–53)
AST: 16 U/L (ref 0–37)
Albumin: 3.9 g/dL (ref 3.5–5.2)
Alkaline Phosphatase: 61 U/L (ref 39–117)
BILIRUBIN TOTAL: 0.9 mg/dL (ref 0.2–1.2)
Bilirubin, Direct: 0.1 mg/dL (ref 0.0–0.3)
TOTAL PROTEIN: 6.8 g/dL (ref 6.0–8.3)

## 2013-08-05 MED ORDER — CYANOCOBALAMIN 1000 MCG/ML IJ SOLN
1000.0000 ug | Freq: Once | INTRAMUSCULAR | Status: AC
  Administered 2013-08-05: 1000 ug via INTRAMUSCULAR

## 2013-08-06 ENCOUNTER — Other Ambulatory Visit: Payer: Self-pay | Admitting: Internal Medicine

## 2013-08-06 DIAGNOSIS — E876 Hypokalemia: Secondary | ICD-10-CM

## 2013-08-06 NOTE — Progress Notes (Signed)
Order placed for f/u labs.  

## 2013-08-12 ENCOUNTER — Encounter (INDEPENDENT_AMBULATORY_CARE_PROVIDER_SITE_OTHER): Payer: Self-pay

## 2013-08-12 ENCOUNTER — Other Ambulatory Visit (INDEPENDENT_AMBULATORY_CARE_PROVIDER_SITE_OTHER): Payer: Medicare Other

## 2013-08-12 DIAGNOSIS — E876 Hypokalemia: Secondary | ICD-10-CM | POA: Diagnosis not present

## 2013-08-13 LAB — POTASSIUM: POTASSIUM: 3.9 meq/L (ref 3.5–5.1)

## 2013-08-13 LAB — CREATININE, SERUM: CREATININE: 1.2 mg/dL (ref 0.4–1.5)

## 2013-08-14 ENCOUNTER — Encounter: Payer: Self-pay | Admitting: *Deleted

## 2013-10-28 DIAGNOSIS — G9389 Other specified disorders of brain: Secondary | ICD-10-CM | POA: Diagnosis not present

## 2013-10-28 DIAGNOSIS — G35 Multiple sclerosis: Secondary | ICD-10-CM | POA: Diagnosis not present

## 2013-10-28 DIAGNOSIS — G936 Cerebral edema: Secondary | ICD-10-CM | POA: Diagnosis not present

## 2013-10-30 ENCOUNTER — Telehealth: Payer: Self-pay | Admitting: Internal Medicine

## 2013-10-30 NOTE — Telephone Encounter (Signed)
Spoke with patient & notified him that Philip Gordon needs to be added to his DPR. Pt notified me of an abnormal MRI that was ordered by Dr. Ala Bent (Neurology) in [facility]. Pt would like to get a referral to see Dr. [name]. I notified pt that we will request his records from Dr. Shelia Media on Monday & work on the referral.

## 2013-10-30 NOTE — Telephone Encounter (Signed)
[name] as a contact person stopped by and would like Dr.Scott to give him a call. Stated pt had prostate cancer a few years ago and was wanting to get a referral to Dr. [name] but didn't know if he needed an appt.

## 2013-10-30 NOTE — Telephone Encounter (Signed)
Please advise 

## 2013-10-30 NOTE — Telephone Encounter (Signed)
I am not sure if this is someone we have permission to talk to.  It was my impression that Philip Gordon was being followed by Dr [name] and Dr [name].  Need to confirm with pt.  (he was being seen by both of them).

## 2013-10-30 NOTE — Telephone Encounter (Signed)
Agree.  Once we get records, I can send info for referral.

## 2013-11-02 NOTE — Telephone Encounter (Signed)
Records requested via fax 

## 2013-11-04 ENCOUNTER — Telehealth: Payer: Self-pay | Admitting: Internal Medicine

## 2013-11-04 DIAGNOSIS — R9089 Other abnormal findings on diagnostic imaging of central nervous system: Secondary | ICD-10-CM

## 2013-11-04 DIAGNOSIS — C61 Malignant neoplasm of prostate: Secondary | ICD-10-CM

## 2013-11-04 NOTE — Telephone Encounter (Signed)
Noted.  Will call cancer center.

## 2013-11-04 NOTE — Telephone Encounter (Signed)
Spoke to Dr [name].  Notified of MRI brain results and concern over possible metastatic lesion.  Schedule appt with Dr [name].  Order placed for referral.

## 2013-11-04 NOTE — Telephone Encounter (Signed)
Records received & placed in your folder for review

## 2013-11-05 ENCOUNTER — Ambulatory Visit: Payer: Self-pay | Admitting: Oncology

## 2013-11-10 ENCOUNTER — Ambulatory Visit: Payer: Self-pay | Admitting: Oncology

## 2013-11-10 DIAGNOSIS — Z79899 Other long term (current) drug therapy: Secondary | ICD-10-CM | POA: Diagnosis not present

## 2013-11-10 DIAGNOSIS — F329 Major depressive disorder, single episode, unspecified: Secondary | ICD-10-CM | POA: Diagnosis not present

## 2013-11-10 DIAGNOSIS — F3289 Other specified depressive episodes: Secondary | ICD-10-CM | POA: Diagnosis not present

## 2013-11-10 DIAGNOSIS — G35 Multiple sclerosis: Secondary | ICD-10-CM | POA: Diagnosis not present

## 2013-11-10 DIAGNOSIS — Z8546 Personal history of malignant neoplasm of prostate: Secondary | ICD-10-CM | POA: Diagnosis not present

## 2013-11-10 DIAGNOSIS — R978 Other abnormal tumor markers: Secondary | ICD-10-CM | POA: Diagnosis not present

## 2013-11-10 DIAGNOSIS — I10 Essential (primary) hypertension: Secondary | ICD-10-CM | POA: Diagnosis not present

## 2013-11-10 DIAGNOSIS — G9389 Other specified disorders of brain: Secondary | ICD-10-CM | POA: Diagnosis not present

## 2013-11-10 DIAGNOSIS — M949 Disorder of cartilage, unspecified: Secondary | ICD-10-CM | POA: Diagnosis not present

## 2013-11-10 DIAGNOSIS — M899 Disorder of bone, unspecified: Secondary | ICD-10-CM | POA: Diagnosis not present

## 2013-11-10 DIAGNOSIS — R911 Solitary pulmonary nodule: Secondary | ICD-10-CM | POA: Diagnosis not present

## 2013-11-10 LAB — CBC CANCER CENTER
Basophil #: 0 x10 3/mm (ref 0.0–0.1)
Basophil %: 0.4 %
EOS PCT: 0.7 %
Eosinophil #: 0 x10 3/mm (ref 0.0–0.7)
HCT: 46 % (ref 40.0–52.0)
HGB: 15.9 g/dL (ref 13.0–18.0)
LYMPHS ABS: 1.1 x10 3/mm (ref 1.0–3.6)
LYMPHS PCT: 18.7 %
MCH: 31.9 pg (ref 26.0–34.0)
MCHC: 34.4 g/dL (ref 32.0–36.0)
MCV: 93 fL (ref 80–100)
MONO ABS: 0.6 x10 3/mm (ref 0.2–1.0)
MONOS PCT: 10.7 %
NEUTROS ABS: 3.9 x10 3/mm (ref 1.4–6.5)
Neutrophil %: 69.5 %
PLATELETS: 236 x10 3/mm (ref 150–440)
RBC: 4.97 10*6/uL (ref 4.40–5.90)
RDW: 12.9 % (ref 11.5–14.5)
WBC: 5.6 x10 3/mm (ref 3.8–10.6)

## 2013-11-10 LAB — COMPREHENSIVE METABOLIC PANEL
ALT: 19 U/L
ANION GAP: 9 (ref 7–16)
Albumin: 3.9 g/dL (ref 3.4–5.0)
Alkaline Phosphatase: 87 U/L
BUN: 10 mg/dL (ref 7–18)
Bilirubin,Total: 0.6 mg/dL (ref 0.2–1.0)
CALCIUM: 8.9 mg/dL (ref 8.5–10.1)
CHLORIDE: 100 mmol/L (ref 98–107)
CREATININE: 1.32 mg/dL — AB (ref 0.60–1.30)
Co2: 29 mmol/L (ref 21–32)
GFR CALC NON AF AMER: 56 — AB
Glucose: 87 mg/dL (ref 65–99)
OSMOLALITY: 274 (ref 275–301)
Potassium: 4.4 mmol/L (ref 3.5–5.1)
SGOT(AST): 14 U/L — ABNORMAL LOW (ref 15–37)
Sodium: 138 mmol/L (ref 136–145)
TOTAL PROTEIN: 7.6 g/dL (ref 6.4–8.2)

## 2013-11-12 DIAGNOSIS — Z79899 Other long term (current) drug therapy: Secondary | ICD-10-CM | POA: Diagnosis not present

## 2013-11-12 DIAGNOSIS — M899 Disorder of bone, unspecified: Secondary | ICD-10-CM | POA: Diagnosis not present

## 2013-11-12 DIAGNOSIS — M949 Disorder of cartilage, unspecified: Secondary | ICD-10-CM | POA: Diagnosis not present

## 2013-11-12 DIAGNOSIS — Z8546 Personal history of malignant neoplasm of prostate: Secondary | ICD-10-CM | POA: Diagnosis not present

## 2013-11-12 DIAGNOSIS — I700 Atherosclerosis of aorta: Secondary | ICD-10-CM | POA: Diagnosis not present

## 2013-11-12 DIAGNOSIS — R911 Solitary pulmonary nodule: Secondary | ICD-10-CM | POA: Diagnosis not present

## 2013-11-12 DIAGNOSIS — E7889 Other lipoprotein metabolism disorders: Secondary | ICD-10-CM | POA: Diagnosis not present

## 2013-11-12 DIAGNOSIS — G9389 Other specified disorders of brain: Secondary | ICD-10-CM | POA: Diagnosis not present

## 2013-11-12 DIAGNOSIS — G35 Multiple sclerosis: Secondary | ICD-10-CM | POA: Diagnosis not present

## 2013-11-12 DIAGNOSIS — I10 Essential (primary) hypertension: Secondary | ICD-10-CM | POA: Diagnosis not present

## 2013-11-12 LAB — CEA: CEA: 1.5 ng/mL (ref 0.0–4.7)

## 2013-11-16 DIAGNOSIS — G9389 Other specified disorders of brain: Secondary | ICD-10-CM | POA: Diagnosis not present

## 2013-11-16 DIAGNOSIS — G35 Multiple sclerosis: Secondary | ICD-10-CM | POA: Diagnosis not present

## 2013-11-16 DIAGNOSIS — Z79899 Other long term (current) drug therapy: Secondary | ICD-10-CM | POA: Diagnosis not present

## 2013-11-16 DIAGNOSIS — I10 Essential (primary) hypertension: Secondary | ICD-10-CM | POA: Diagnosis not present

## 2013-11-16 DIAGNOSIS — M899 Disorder of bone, unspecified: Secondary | ICD-10-CM | POA: Diagnosis not present

## 2013-11-16 DIAGNOSIS — Z8546 Personal history of malignant neoplasm of prostate: Secondary | ICD-10-CM | POA: Diagnosis not present

## 2013-11-24 ENCOUNTER — Ambulatory Visit: Payer: Self-pay | Admitting: Oncology

## 2013-11-24 DIAGNOSIS — M899 Disorder of bone, unspecified: Secondary | ICD-10-CM | POA: Diagnosis not present

## 2013-11-24 DIAGNOSIS — Z79899 Other long term (current) drug therapy: Secondary | ICD-10-CM | POA: Diagnosis not present

## 2013-11-24 DIAGNOSIS — N289 Disorder of kidney and ureter, unspecified: Secondary | ICD-10-CM | POA: Diagnosis not present

## 2013-11-24 DIAGNOSIS — Z8546 Personal history of malignant neoplasm of prostate: Secondary | ICD-10-CM | POA: Diagnosis not present

## 2013-11-24 DIAGNOSIS — I10 Essential (primary) hypertension: Secondary | ICD-10-CM | POA: Diagnosis not present

## 2013-11-24 DIAGNOSIS — F329 Major depressive disorder, single episode, unspecified: Secondary | ICD-10-CM | POA: Diagnosis not present

## 2013-11-24 DIAGNOSIS — M949 Disorder of cartilage, unspecified: Secondary | ICD-10-CM | POA: Diagnosis not present

## 2013-11-24 DIAGNOSIS — F3289 Other specified depressive episodes: Secondary | ICD-10-CM | POA: Diagnosis not present

## 2013-11-24 DIAGNOSIS — G9389 Other specified disorders of brain: Secondary | ICD-10-CM | POA: Diagnosis not present

## 2013-11-24 DIAGNOSIS — G35 Multiple sclerosis: Secondary | ICD-10-CM | POA: Diagnosis not present

## 2013-12-17 DIAGNOSIS — C7931 Secondary malignant neoplasm of brain: Secondary | ICD-10-CM | POA: Diagnosis not present

## 2013-12-17 DIAGNOSIS — C61 Malignant neoplasm of prostate: Secondary | ICD-10-CM | POA: Diagnosis not present

## 2013-12-17 DIAGNOSIS — C7949 Secondary malignant neoplasm of other parts of nervous system: Secondary | ICD-10-CM | POA: Diagnosis not present

## 2013-12-17 DIAGNOSIS — N281 Cyst of kidney, acquired: Secondary | ICD-10-CM | POA: Diagnosis not present

## 2013-12-24 ENCOUNTER — Ambulatory Visit: Payer: Self-pay | Admitting: Oncology

## 2014-01-07 ENCOUNTER — Ambulatory Visit (INDEPENDENT_AMBULATORY_CARE_PROVIDER_SITE_OTHER): Payer: Medicare Other | Admitting: Internal Medicine

## 2014-01-07 ENCOUNTER — Encounter: Payer: Self-pay | Admitting: Internal Medicine

## 2014-01-07 VITALS — BP 120/70 | HR 55 | Temp 97.8°F | Wt 136.0 lb

## 2014-01-07 DIAGNOSIS — Z23 Encounter for immunization: Secondary | ICD-10-CM

## 2014-01-07 DIAGNOSIS — G35 Multiple sclerosis: Secondary | ICD-10-CM

## 2014-01-07 DIAGNOSIS — C61 Malignant neoplasm of prostate: Secondary | ICD-10-CM

## 2014-01-07 DIAGNOSIS — E538 Deficiency of other specified B group vitamins: Secondary | ICD-10-CM | POA: Diagnosis not present

## 2014-01-07 DIAGNOSIS — R2681 Unsteadiness on feet: Secondary | ICD-10-CM

## 2014-01-07 DIAGNOSIS — I10 Essential (primary) hypertension: Secondary | ICD-10-CM | POA: Diagnosis not present

## 2014-01-07 DIAGNOSIS — E7800 Pure hypercholesterolemia, unspecified: Secondary | ICD-10-CM

## 2014-01-07 DIAGNOSIS — G939 Disorder of brain, unspecified: Secondary | ICD-10-CM

## 2014-01-07 LAB — BASIC METABOLIC PANEL
BUN: 12 mg/dL (ref 6–23)
CALCIUM: 9.3 mg/dL (ref 8.4–10.5)
CO2: 32 meq/L (ref 19–32)
Chloride: 97 mEq/L (ref 96–112)
Creatinine, Ser: 1.2 mg/dL (ref 0.4–1.5)
GFR: 67.74 mL/min (ref >60.00)
Glucose, Bld: 78 mg/dL (ref 70–99)
Potassium: 3.7 mEq/L (ref 3.5–5.1)
SODIUM: 134 meq/L — AB (ref 135–145)

## 2014-01-07 LAB — HEPATIC FUNCTION PANEL
ALT: 14 U/L (ref 0–53)
AST: 15 U/L (ref 0–37)
Albumin: 3.6 g/dL (ref 3.5–5.2)
Alkaline Phosphatase: 68 U/L (ref 39–117)
BILIRUBIN DIRECT: 0.2 mg/dL (ref 0.0–0.3)
BILIRUBIN TOTAL: 1.1 mg/dL (ref 0.2–1.2)
Total Protein: 7.1 g/dL (ref 6.0–8.3)

## 2014-01-07 LAB — PSA, MEDICARE: PSA: 0.08 ng/ml — ABNORMAL LOW (ref 0.10–4.00)

## 2014-01-07 MED ORDER — CYANOCOBALAMIN 1000 MCG/ML IJ SOLN
1000.0000 ug | Freq: Once | INTRAMUSCULAR | Status: AC
  Administered 2014-01-07: 1000 ug via INTRAMUSCULAR

## 2014-01-07 NOTE — Progress Notes (Signed)
Pre visit review using our clinic review tool, if applicable. No additional management support is needed unless otherwise documented below in the visit note. 

## 2014-01-08 ENCOUNTER — Other Ambulatory Visit: Payer: Self-pay | Admitting: *Deleted

## 2014-01-08 ENCOUNTER — Other Ambulatory Visit: Payer: Self-pay | Admitting: Internal Medicine

## 2014-01-08 DIAGNOSIS — E871 Hypo-osmolality and hyponatremia: Secondary | ICD-10-CM

## 2014-01-08 MED ORDER — HYDROCHLOROTHIAZIDE 12.5 MG PO CAPS: ORAL_CAPSULE | ORAL | Status: DC

## 2014-01-08 NOTE — Progress Notes (Signed)
Order placed for f/u sodium.  ?

## 2014-01-15 ENCOUNTER — Other Ambulatory Visit (INDEPENDENT_AMBULATORY_CARE_PROVIDER_SITE_OTHER): Payer: Medicare Other

## 2014-01-15 DIAGNOSIS — E871 Hypo-osmolality and hyponatremia: Secondary | ICD-10-CM

## 2014-01-15 LAB — SODIUM: SODIUM: 138 meq/L (ref 135–145)

## 2014-01-16 ENCOUNTER — Encounter: Payer: Self-pay | Admitting: Internal Medicine

## 2014-01-16 NOTE — Progress Notes (Signed)
Subjective:    Patient ID: Philip Gordon, male    DOB: 24-Jan-1951, 63 y.o.   MRN: [id number]  HPI 63 year old male with past history of multiple sclerosis, hypertension and hypercholesterolemia.  He comes in today to follow up on these issues as well as for a complete physical exam.   Has previously had some issues with right posterior shoulder pain.  Seeing neurology.  Not able to get out and go to PT now.  Needs physical therapy.  Needs for his shoulder and also for his gait.   More unsteady.  Seeing Dr [name].  He is following him regarding his history of prostate cancer.  He is following psa.   Just evaluated.   Breathing stable.  Denies any alcohol intake.   Bowels stable.  Taking his medication regularly. Recenlyt found to have a brain tumor.  Seeing Dr [name].  See his note for details.  Primary brain tumur not ruled out.  Had questions about his medication.  zetia too expensive.    Past Medical History  Diagnosis Date  . Hypertension   . Hypercholesterolemia   . Multiple sclerosis     Sees Dr Ala Bent [facility])  . Prostate cancer     Followed by Dr [name] and Dr [name]  . Depression     Current Outpatient Prescriptions on File Prior to Visit  Medication Sig Dispense Refill  . ALPRAZolam (XANAX) 0.5 MG tablet Take 0.5 mg by mouth 3 (three) times daily as needed.       Marland Kitchen amLODipine (NORVASC) 5 MG tablet Take 1 tablet (5 mg total) by mouth daily.  90 tablet  3  . cyanocobalamin (,VITAMIN B-12,) 1000 MCG/ML injection Inject 1,000 mcg into the muscle once.      Marland Kitchen dalfampridine (AMPYRA) 10 MG TB12 Take 10 mg by mouth 2 (two) times daily.       Marland Kitchen ezetimibe (ZETIA) 10 MG tablet Take 1 tablet (10 mg total) by mouth daily.  90 tablet  1  . finasteride (PROSCAR) 5 MG tablet Take 5 mg by mouth daily.      Marland Kitchen tiZANidine (ZANAFLEX) 4 MG capsule Take 4 mg by mouth at bedtime.        No current facility-administered medications on file prior to visit.    Review of Systems Patient denies any  headache, lightheadedness or dizziness.  No sinus or allergy symptoms.  No chest pain, tightness or palpitations. No increased shortness of breath, cough or congestion.  No nausea or vomiting.  No acid reflux. No abdominal pain or cramping.  No bowel change, such as diarrhea, constipation, BRBPR or melana.  No urine change.  Seeing neurology for his MS and urology for prostate cancer.  Now seeing Dr [name] for evaluation of a recent brain lesion.  Contemplating treatment.           Objective:   Physical Exam  Filed Vitals:   01/07/14 1344  BP: 120/70  Pulse: 55  Temp: 97.8 F (36.6 C)   blood pressure recheck:  63/51  63 year old male in no acute distress.  HEENT:  Nares - clear.  Oropharynx - without lesions. NECK:  Supple.  Nontender.  No audible carotid bruit.  HEART:  Appears to be regular.   LUNGS:  No crackles or wheezing audible.  Respirations even and unlabored.   RADIAL PULSE:  Equal bilaterally.  ABDOMEN:  Soft.  Nontender.  Bowel sounds present and normal.  No audible abdominal bruit.  GU:  Performed by urology.   EXTREMITIES:  No increased edema present.  DP pulses palpable and equal bilaterally.        Assessment & Plan:  HEALTH MAINTENANCE.  Physical today.   Prostate (psa) being followed by Dr [name].    DISPOSITION.  He is living at home by himself.   Niece and her husband are involved.  Will arrange Home Health for help with ADLs and with physiical therapy and for evaluation of other needs.    Problem List Items Addressed This Visit   B12 deficiency     Continue B12 injections.      Relevant Medications      cyanocobalamin ((VITAMIN B-12)) injection 1,000 mcg (Completed)   Brain lesion     Seeing Dr [name].  W/up in progress.  Unclear if primary brain lesion.       Hypercholesteremia     On Zetia.  Unable to take statins (per neurology).   Low cholesterol diet.  Check lipid profile with next fasting labs.  Stop zetia.        Hypertension     Blood  pressure under good control.  Same medication regimen.  Follow metabolic panel.       Relevant Orders      Basic metabolic panel (Completed)   Multiple sclerosis     Sees Dr Ala Bent.   Gait - more unsteady.  Needs therapy.  Arrange home health physical therapy.       Prostate cancer     Followed by Dr [name] and Dr [name].  Dr [name] follows his PSA.  His POA Georgina Quint accompanies him today.  Wants psa checked today and sent to Dr [name].  Spero Curb (POA) [phone] and [name] [facility]).         Relevant Orders      PSA, Medicare (Completed)    Other Visit Diagnoses   Hypercholesterolemia    -  Primary    Relevant Orders       Hepatic function panel (Completed)    Encounter for immunization          I spent 25 minutes with the patient and more than 50% of the time was spent in consultation regarding the above.

## 2014-01-17 ENCOUNTER — Encounter: Payer: Self-pay | Admitting: Internal Medicine

## 2014-01-17 DIAGNOSIS — G939 Disorder of brain, unspecified: Secondary | ICD-10-CM | POA: Insufficient documentation

## 2014-01-17 NOTE — Assessment & Plan Note (Signed)
Seeing Dr [name].  W/up in progress.  Unclear if primary brain lesion.

## 2014-01-17 NOTE — Assessment & Plan Note (Signed)
Blood pressure under good control.  Same medication regimen.  Follow metabolic panel.   

## 2014-01-17 NOTE — Assessment & Plan Note (Signed)
On Zetia.  Unable to take statins (per neurology).   Low cholesterol diet.  Check lipid profile with next fasting labs.  Stop zetia.

## 2014-01-17 NOTE — Assessment & Plan Note (Signed)
Followed by Dr [name] and Dr [name].  Dr [name] follows his PSA.  His POA Georgina Quint accompanies him today.  Wants psa checked today and sent to Dr [name].  Spero Curb (POA) [phone] and [name] [facility]).

## 2014-01-17 NOTE — Assessment & Plan Note (Signed)
Sees Dr Ala Bent.   Gait - more unsteady.  Needs therapy.  Arrange home health physical therapy.

## 2014-01-17 NOTE — Assessment & Plan Note (Signed)
Continue B12 injections.   

## 2014-01-18 ENCOUNTER — Encounter: Payer: Self-pay | Admitting: *Deleted

## 2014-01-20 DIAGNOSIS — F329 Major depressive disorder, single episode, unspecified: Secondary | ICD-10-CM | POA: Diagnosis not present

## 2014-01-20 DIAGNOSIS — C61 Malignant neoplasm of prostate: Secondary | ICD-10-CM | POA: Diagnosis not present

## 2014-01-20 DIAGNOSIS — I10 Essential (primary) hypertension: Secondary | ICD-10-CM | POA: Diagnosis not present

## 2014-01-20 DIAGNOSIS — Z9181 History of falling: Secondary | ICD-10-CM | POA: Diagnosis not present

## 2014-01-20 DIAGNOSIS — G939 Disorder of brain, unspecified: Secondary | ICD-10-CM | POA: Diagnosis not present

## 2014-01-20 DIAGNOSIS — G35 Multiple sclerosis: Secondary | ICD-10-CM | POA: Diagnosis not present

## 2014-01-20 DIAGNOSIS — G9389 Other specified disorders of brain: Secondary | ICD-10-CM | POA: Diagnosis not present

## 2014-01-24 DIAGNOSIS — Z7689 Persons encountering health services in other specified circumstances: Secondary | ICD-10-CM

## 2014-01-25 ENCOUNTER — Telehealth: Payer: Self-pay | Admitting: Internal Medicine

## 2014-01-25 DIAGNOSIS — G35 Multiple sclerosis: Secondary | ICD-10-CM | POA: Diagnosis not present

## 2014-01-25 DIAGNOSIS — I10 Essential (primary) hypertension: Secondary | ICD-10-CM | POA: Diagnosis not present

## 2014-01-25 DIAGNOSIS — F329 Major depressive disorder, single episode, unspecified: Secondary | ICD-10-CM | POA: Diagnosis not present

## 2014-01-25 DIAGNOSIS — G939 Disorder of brain, unspecified: Secondary | ICD-10-CM | POA: Diagnosis not present

## 2014-01-25 DIAGNOSIS — Z9181 History of falling: Secondary | ICD-10-CM | POA: Diagnosis not present

## 2014-01-25 DIAGNOSIS — C61 Malignant neoplasm of prostate: Secondary | ICD-10-CM | POA: Diagnosis not present

## 2014-01-25 NOTE — Telephone Encounter (Signed)
Please advise on status

## 2014-01-25 NOTE — Telephone Encounter (Signed)
Philip Gordon called wondering if Dr. Nicki Reaper has filled out the paperwork he dropped off last week. He said he dropped off paperwork that needs to be filled out by Dr. Nicki Reaper so he can continue driving. He has to take it to the [facility]. Philip Gordon said there are two other doctors he has to send the paperwork to within the next two weeks. Please call the patient. Pt ph# [phone] Thank you.

## 2014-01-26 DIAGNOSIS — Z9181 History of falling: Secondary | ICD-10-CM | POA: Diagnosis not present

## 2014-01-26 DIAGNOSIS — G939 Disorder of brain, unspecified: Secondary | ICD-10-CM | POA: Diagnosis not present

## 2014-01-26 DIAGNOSIS — I10 Essential (primary) hypertension: Secondary | ICD-10-CM | POA: Diagnosis not present

## 2014-01-26 DIAGNOSIS — C61 Malignant neoplasm of prostate: Secondary | ICD-10-CM | POA: Diagnosis not present

## 2014-01-26 DIAGNOSIS — G35 Multiple sclerosis: Secondary | ICD-10-CM | POA: Diagnosis not present

## 2014-01-26 DIAGNOSIS — F329 Major depressive disorder, single episode, unspecified: Secondary | ICD-10-CM | POA: Diagnosis not present

## 2014-01-26 NOTE — Telephone Encounter (Signed)
Duplicate. See other note.

## 2014-01-26 NOTE — Telephone Encounter (Signed)
I have completed my portion of the driving form.  The disability form and the driving form will have to be completed by his neurologist.  There is also a section for opthalmology.  In your basket.

## 2014-01-26 NOTE — Telephone Encounter (Signed)
Pt notified ready for pickup. Copy sent to scan and to billing

## 2014-01-28 DIAGNOSIS — G35 Multiple sclerosis: Secondary | ICD-10-CM | POA: Diagnosis not present

## 2014-01-28 DIAGNOSIS — Z9181 History of falling: Secondary | ICD-10-CM | POA: Diagnosis not present

## 2014-01-28 DIAGNOSIS — I10 Essential (primary) hypertension: Secondary | ICD-10-CM | POA: Diagnosis not present

## 2014-01-28 DIAGNOSIS — F329 Major depressive disorder, single episode, unspecified: Secondary | ICD-10-CM | POA: Diagnosis not present

## 2014-01-28 DIAGNOSIS — G939 Disorder of brain, unspecified: Secondary | ICD-10-CM | POA: Diagnosis not present

## 2014-01-28 DIAGNOSIS — C61 Malignant neoplasm of prostate: Secondary | ICD-10-CM | POA: Diagnosis not present

## 2014-01-29 DIAGNOSIS — Z9181 History of falling: Secondary | ICD-10-CM | POA: Diagnosis not present

## 2014-01-29 DIAGNOSIS — F329 Major depressive disorder, single episode, unspecified: Secondary | ICD-10-CM | POA: Diagnosis not present

## 2014-01-29 DIAGNOSIS — I10 Essential (primary) hypertension: Secondary | ICD-10-CM | POA: Diagnosis not present

## 2014-01-29 DIAGNOSIS — G35 Multiple sclerosis: Secondary | ICD-10-CM | POA: Diagnosis not present

## 2014-01-29 DIAGNOSIS — G939 Disorder of brain, unspecified: Secondary | ICD-10-CM | POA: Diagnosis not present

## 2014-01-29 DIAGNOSIS — C61 Malignant neoplasm of prostate: Secondary | ICD-10-CM | POA: Diagnosis not present

## 2014-02-01 DIAGNOSIS — F329 Major depressive disorder, single episode, unspecified: Secondary | ICD-10-CM | POA: Diagnosis not present

## 2014-02-01 DIAGNOSIS — C61 Malignant neoplasm of prostate: Secondary | ICD-10-CM | POA: Diagnosis not present

## 2014-02-01 DIAGNOSIS — Z9181 History of falling: Secondary | ICD-10-CM | POA: Diagnosis not present

## 2014-02-01 DIAGNOSIS — G35 Multiple sclerosis: Secondary | ICD-10-CM | POA: Diagnosis not present

## 2014-02-01 DIAGNOSIS — I10 Essential (primary) hypertension: Secondary | ICD-10-CM | POA: Diagnosis not present

## 2014-02-01 DIAGNOSIS — G939 Disorder of brain, unspecified: Secondary | ICD-10-CM | POA: Diagnosis not present

## 2014-02-02 DIAGNOSIS — G939 Disorder of brain, unspecified: Secondary | ICD-10-CM | POA: Diagnosis not present

## 2014-02-02 DIAGNOSIS — F329 Major depressive disorder, single episode, unspecified: Secondary | ICD-10-CM | POA: Diagnosis not present

## 2014-02-02 DIAGNOSIS — C61 Malignant neoplasm of prostate: Secondary | ICD-10-CM | POA: Diagnosis not present

## 2014-02-02 DIAGNOSIS — G35 Multiple sclerosis: Secondary | ICD-10-CM | POA: Diagnosis not present

## 2014-02-02 DIAGNOSIS — I10 Essential (primary) hypertension: Secondary | ICD-10-CM | POA: Diagnosis not present

## 2014-02-02 DIAGNOSIS — Z9181 History of falling: Secondary | ICD-10-CM | POA: Diagnosis not present

## 2014-02-03 DIAGNOSIS — G939 Disorder of brain, unspecified: Secondary | ICD-10-CM | POA: Diagnosis not present

## 2014-02-03 DIAGNOSIS — G35 Multiple sclerosis: Secondary | ICD-10-CM | POA: Diagnosis not present

## 2014-02-03 DIAGNOSIS — I10 Essential (primary) hypertension: Secondary | ICD-10-CM | POA: Diagnosis not present

## 2014-02-03 DIAGNOSIS — Z9181 History of falling: Secondary | ICD-10-CM | POA: Diagnosis not present

## 2014-02-03 DIAGNOSIS — C61 Malignant neoplasm of prostate: Secondary | ICD-10-CM | POA: Diagnosis not present

## 2014-02-03 DIAGNOSIS — F329 Major depressive disorder, single episode, unspecified: Secondary | ICD-10-CM | POA: Diagnosis not present

## 2014-02-04 DIAGNOSIS — Z9181 History of falling: Secondary | ICD-10-CM | POA: Diagnosis not present

## 2014-02-04 DIAGNOSIS — G939 Disorder of brain, unspecified: Secondary | ICD-10-CM | POA: Diagnosis not present

## 2014-02-04 DIAGNOSIS — I10 Essential (primary) hypertension: Secondary | ICD-10-CM | POA: Diagnosis not present

## 2014-02-04 DIAGNOSIS — G35 Multiple sclerosis: Secondary | ICD-10-CM | POA: Diagnosis not present

## 2014-02-04 DIAGNOSIS — C61 Malignant neoplasm of prostate: Secondary | ICD-10-CM | POA: Diagnosis not present

## 2014-02-04 DIAGNOSIS — F329 Major depressive disorder, single episode, unspecified: Secondary | ICD-10-CM | POA: Diagnosis not present

## 2014-02-09 DIAGNOSIS — Z9181 History of falling: Secondary | ICD-10-CM | POA: Diagnosis not present

## 2014-02-09 DIAGNOSIS — I10 Essential (primary) hypertension: Secondary | ICD-10-CM | POA: Diagnosis not present

## 2014-02-09 DIAGNOSIS — C61 Malignant neoplasm of prostate: Secondary | ICD-10-CM | POA: Diagnosis not present

## 2014-02-09 DIAGNOSIS — F329 Major depressive disorder, single episode, unspecified: Secondary | ICD-10-CM | POA: Diagnosis not present

## 2014-02-09 DIAGNOSIS — G939 Disorder of brain, unspecified: Secondary | ICD-10-CM | POA: Diagnosis not present

## 2014-02-09 DIAGNOSIS — G35 Multiple sclerosis: Secondary | ICD-10-CM | POA: Diagnosis not present

## 2014-02-11 DIAGNOSIS — G939 Disorder of brain, unspecified: Secondary | ICD-10-CM | POA: Diagnosis not present

## 2014-02-11 DIAGNOSIS — C61 Malignant neoplasm of prostate: Secondary | ICD-10-CM | POA: Diagnosis not present

## 2014-02-11 DIAGNOSIS — I10 Essential (primary) hypertension: Secondary | ICD-10-CM | POA: Diagnosis not present

## 2014-02-11 DIAGNOSIS — F329 Major depressive disorder, single episode, unspecified: Secondary | ICD-10-CM | POA: Diagnosis not present

## 2014-02-11 DIAGNOSIS — Z9181 History of falling: Secondary | ICD-10-CM | POA: Diagnosis not present

## 2014-02-11 DIAGNOSIS — G35 Multiple sclerosis: Secondary | ICD-10-CM | POA: Diagnosis not present

## 2014-02-15 ENCOUNTER — Ambulatory Visit: Payer: Self-pay | Admitting: Oncology

## 2014-02-15 DIAGNOSIS — D432 Neoplasm of uncertain behavior of brain, unspecified: Secondary | ICD-10-CM | POA: Diagnosis not present

## 2014-02-15 DIAGNOSIS — C61 Malignant neoplasm of prostate: Secondary | ICD-10-CM | POA: Diagnosis not present

## 2014-02-17 DIAGNOSIS — C61 Malignant neoplasm of prostate: Secondary | ICD-10-CM

## 2014-02-17 DIAGNOSIS — G35 Multiple sclerosis: Secondary | ICD-10-CM

## 2014-02-17 DIAGNOSIS — G939 Disorder of brain, unspecified: Secondary | ICD-10-CM

## 2014-02-17 DIAGNOSIS — I110 Hypertensive heart disease with heart failure: Secondary | ICD-10-CM

## 2014-02-22 ENCOUNTER — Ambulatory Visit: Payer: Self-pay | Admitting: Oncology

## 2014-02-22 ENCOUNTER — Ambulatory Visit: Payer: Medicare Other

## 2014-02-22 DIAGNOSIS — G35 Multiple sclerosis: Secondary | ICD-10-CM | POA: Diagnosis not present

## 2014-02-22 DIAGNOSIS — M899 Disorder of bone, unspecified: Secondary | ICD-10-CM | POA: Diagnosis not present

## 2014-02-22 DIAGNOSIS — I10 Essential (primary) hypertension: Secondary | ICD-10-CM | POA: Diagnosis not present

## 2014-02-22 DIAGNOSIS — D72829 Elevated white blood cell count, unspecified: Secondary | ICD-10-CM | POA: Diagnosis not present

## 2014-02-22 DIAGNOSIS — Z79899 Other long term (current) drug therapy: Secondary | ICD-10-CM | POA: Diagnosis not present

## 2014-02-22 DIAGNOSIS — C61 Malignant neoplasm of prostate: Secondary | ICD-10-CM | POA: Diagnosis not present

## 2014-02-22 LAB — CBC CANCER CENTER
BASOS ABS: 0.1 x10 3/mm (ref 0.0–0.1)
Basophil %: 0.7 %
EOS ABS: 0 x10 3/mm (ref 0.0–0.7)
Eosinophil %: 0 %
HCT: 43.8 % (ref 40.0–52.0)
HGB: 14.5 g/dL (ref 13.0–18.0)
LYMPHS PCT: 7.8 %
Lymphocyte #: 1.1 x10 3/mm (ref 1.0–3.6)
MCH: 30.2 pg (ref 26.0–34.0)
MCHC: 33.2 g/dL (ref 32.0–36.0)
MCV: 91 fL (ref 80–100)
Monocyte #: 1.8 x10 3/mm — ABNORMAL HIGH (ref 0.2–1.0)
Monocyte %: 13 %
Neutrophil #: 10.9 x10 3/mm — ABNORMAL HIGH (ref 1.4–6.5)
Neutrophil %: 78.5 %
Platelet: 208 x10 3/mm (ref 150–440)
RBC: 4.8 10*6/uL (ref 4.40–5.90)
RDW: 13.3 % (ref 11.5–14.5)
WBC: 13.9 x10 3/mm — ABNORMAL HIGH (ref 3.8–10.6)

## 2014-02-22 LAB — BASIC METABOLIC PANEL
ANION GAP: 13 (ref 7–16)
BUN: 25 mg/dL — AB (ref 7–18)
CALCIUM: 9.3 mg/dL (ref 8.5–10.1)
CO2: 28 mmol/L (ref 21–32)
Chloride: 101 mmol/L (ref 98–107)
Creatinine: 2.25 mg/dL — ABNORMAL HIGH (ref 0.60–1.30)
EGFR (African American): 38 — ABNORMAL LOW
GFR CALC NON AF AMER: 31 — AB
Glucose: 95 mg/dL (ref 65–99)
OSMOLALITY: 287 (ref 275–301)
POTASSIUM: 3.5 mmol/L (ref 3.5–5.1)
Sodium: 142 mmol/L (ref 136–145)

## 2014-02-23 ENCOUNTER — Ambulatory Visit: Payer: Self-pay | Admitting: Oncology

## 2014-02-23 ENCOUNTER — Ambulatory Visit: Payer: Medicare Other

## 2014-02-23 DIAGNOSIS — Z8546 Personal history of malignant neoplasm of prostate: Secondary | ICD-10-CM | POA: Diagnosis not present

## 2014-02-23 DIAGNOSIS — Z79899 Other long term (current) drug therapy: Secondary | ICD-10-CM | POA: Diagnosis not present

## 2014-02-23 DIAGNOSIS — I10 Essential (primary) hypertension: Secondary | ICD-10-CM | POA: Diagnosis not present

## 2014-02-23 DIAGNOSIS — G35 Multiple sclerosis: Secondary | ICD-10-CM | POA: Diagnosis not present

## 2014-02-25 DIAGNOSIS — R296 Repeated falls: Secondary | ICD-10-CM | POA: Diagnosis not present

## 2014-02-25 DIAGNOSIS — E780 Pure hypercholesterolemia: Secondary | ICD-10-CM | POA: Diagnosis not present

## 2014-02-25 DIAGNOSIS — D519 Vitamin B12 deficiency anemia, unspecified: Secondary | ICD-10-CM | POA: Diagnosis not present

## 2014-02-25 DIAGNOSIS — Z87891 Personal history of nicotine dependence: Secondary | ICD-10-CM | POA: Diagnosis not present

## 2014-02-25 DIAGNOSIS — G35 Multiple sclerosis: Secondary | ICD-10-CM | POA: Diagnosis not present

## 2014-02-25 DIAGNOSIS — Z8546 Personal history of malignant neoplasm of prostate: Secondary | ICD-10-CM | POA: Diagnosis not present

## 2014-02-25 DIAGNOSIS — G939 Disorder of brain, unspecified: Secondary | ICD-10-CM | POA: Diagnosis not present

## 2014-02-25 DIAGNOSIS — I10 Essential (primary) hypertension: Secondary | ICD-10-CM | POA: Diagnosis not present

## 2014-02-26 DIAGNOSIS — Z8546 Personal history of malignant neoplasm of prostate: Secondary | ICD-10-CM | POA: Diagnosis not present

## 2014-02-26 DIAGNOSIS — G939 Disorder of brain, unspecified: Secondary | ICD-10-CM | POA: Diagnosis not present

## 2014-02-26 DIAGNOSIS — R296 Repeated falls: Secondary | ICD-10-CM | POA: Diagnosis not present

## 2014-02-26 DIAGNOSIS — E780 Pure hypercholesterolemia: Secondary | ICD-10-CM | POA: Diagnosis not present

## 2014-02-26 DIAGNOSIS — G35 Multiple sclerosis: Secondary | ICD-10-CM | POA: Diagnosis not present

## 2014-02-26 DIAGNOSIS — I10 Essential (primary) hypertension: Secondary | ICD-10-CM | POA: Diagnosis not present

## 2014-03-01 DIAGNOSIS — Z8546 Personal history of malignant neoplasm of prostate: Secondary | ICD-10-CM | POA: Diagnosis not present

## 2014-03-01 DIAGNOSIS — Z79899 Other long term (current) drug therapy: Secondary | ICD-10-CM | POA: Diagnosis not present

## 2014-03-01 DIAGNOSIS — G35 Multiple sclerosis: Secondary | ICD-10-CM | POA: Diagnosis not present

## 2014-03-01 LAB — CBC CANCER CENTER
BASOS ABS: 0 x10 3/mm (ref 0.0–0.1)
BASOS PCT: 0.2 %
Eosinophil #: 0 x10 3/mm (ref 0.0–0.7)
Eosinophil %: 0.1 %
HCT: 42.2 % (ref 40.0–52.0)
HGB: 14.3 g/dL (ref 13.0–18.0)
LYMPHS ABS: 1.1 x10 3/mm (ref 1.0–3.6)
Lymphocyte %: 7.6 %
MCH: 30.4 pg (ref 26.0–34.0)
MCHC: 33.9 g/dL (ref 32.0–36.0)
MCV: 90 fL (ref 80–100)
MONO ABS: 0.7 x10 3/mm (ref 0.2–1.0)
MONOS PCT: 5 %
Neutrophil #: 12.5 x10 3/mm — ABNORMAL HIGH (ref 1.4–6.5)
Neutrophil %: 87.1 %
Platelet: 328 x10 3/mm (ref 150–440)
RBC: 4.71 10*6/uL (ref 4.40–5.90)
RDW: 12.9 % (ref 11.5–14.5)
WBC: 14.4 x10 3/mm — ABNORMAL HIGH (ref 3.8–10.6)

## 2014-03-01 LAB — COMPREHENSIVE METABOLIC PANEL
ALBUMIN: 3.1 g/dL — AB (ref 3.4–5.0)
ALK PHOS: 86 U/L
ALT: 38 U/L
ANION GAP: 7 (ref 7–16)
BILIRUBIN TOTAL: 0.3 mg/dL (ref 0.2–1.0)
BUN: 23 mg/dL — ABNORMAL HIGH (ref 7–18)
CALCIUM: 8.9 mg/dL (ref 8.5–10.1)
CO2: 30 mmol/L (ref 21–32)
CREATININE: 1.45 mg/dL — AB (ref 0.60–1.30)
Chloride: 101 mmol/L (ref 98–107)
EGFR (Non-African Amer.): 52 — ABNORMAL LOW
Glucose: 99 mg/dL (ref 65–99)
Osmolality: 279 (ref 275–301)
Potassium: 3.3 mmol/L — ABNORMAL LOW (ref 3.5–5.1)
SGOT(AST): 26 U/L (ref 15–37)
Sodium: 138 mmol/L (ref 136–145)
TOTAL PROTEIN: 6.6 g/dL (ref 6.4–8.2)

## 2014-03-02 DIAGNOSIS — G939 Disorder of brain, unspecified: Secondary | ICD-10-CM | POA: Diagnosis not present

## 2014-03-02 DIAGNOSIS — E780 Pure hypercholesterolemia: Secondary | ICD-10-CM | POA: Diagnosis not present

## 2014-03-02 DIAGNOSIS — R296 Repeated falls: Secondary | ICD-10-CM | POA: Diagnosis not present

## 2014-03-02 DIAGNOSIS — I10 Essential (primary) hypertension: Secondary | ICD-10-CM | POA: Diagnosis not present

## 2014-03-02 DIAGNOSIS — G35 Multiple sclerosis: Secondary | ICD-10-CM | POA: Diagnosis not present

## 2014-03-02 DIAGNOSIS — Z8546 Personal history of malignant neoplasm of prostate: Secondary | ICD-10-CM | POA: Diagnosis not present

## 2014-03-03 DIAGNOSIS — I10 Essential (primary) hypertension: Secondary | ICD-10-CM | POA: Diagnosis not present

## 2014-03-03 DIAGNOSIS — E780 Pure hypercholesterolemia: Secondary | ICD-10-CM | POA: Diagnosis not present

## 2014-03-03 DIAGNOSIS — G35 Multiple sclerosis: Secondary | ICD-10-CM | POA: Diagnosis not present

## 2014-03-03 DIAGNOSIS — R296 Repeated falls: Secondary | ICD-10-CM | POA: Diagnosis not present

## 2014-03-03 DIAGNOSIS — C61 Malignant neoplasm of prostate: Secondary | ICD-10-CM | POA: Diagnosis not present

## 2014-03-03 DIAGNOSIS — G939 Disorder of brain, unspecified: Secondary | ICD-10-CM | POA: Diagnosis not present

## 2014-03-03 DIAGNOSIS — Z8546 Personal history of malignant neoplasm of prostate: Secondary | ICD-10-CM | POA: Diagnosis not present

## 2014-03-04 DIAGNOSIS — Z8546 Personal history of malignant neoplasm of prostate: Secondary | ICD-10-CM | POA: Diagnosis not present

## 2014-03-04 DIAGNOSIS — E780 Pure hypercholesterolemia: Secondary | ICD-10-CM | POA: Diagnosis not present

## 2014-03-04 DIAGNOSIS — I10 Essential (primary) hypertension: Secondary | ICD-10-CM | POA: Diagnosis not present

## 2014-03-04 DIAGNOSIS — G35 Multiple sclerosis: Secondary | ICD-10-CM | POA: Diagnosis not present

## 2014-03-04 DIAGNOSIS — G939 Disorder of brain, unspecified: Secondary | ICD-10-CM | POA: Diagnosis not present

## 2014-03-04 DIAGNOSIS — R296 Repeated falls: Secondary | ICD-10-CM | POA: Diagnosis not present

## 2014-03-05 DIAGNOSIS — G35 Multiple sclerosis: Secondary | ICD-10-CM | POA: Diagnosis not present

## 2014-03-05 DIAGNOSIS — E780 Pure hypercholesterolemia: Secondary | ICD-10-CM | POA: Diagnosis not present

## 2014-03-05 DIAGNOSIS — Z8546 Personal history of malignant neoplasm of prostate: Secondary | ICD-10-CM | POA: Diagnosis not present

## 2014-03-05 DIAGNOSIS — G939 Disorder of brain, unspecified: Secondary | ICD-10-CM | POA: Diagnosis not present

## 2014-03-05 DIAGNOSIS — R296 Repeated falls: Secondary | ICD-10-CM | POA: Diagnosis not present

## 2014-03-05 DIAGNOSIS — I10 Essential (primary) hypertension: Secondary | ICD-10-CM | POA: Diagnosis not present

## 2014-03-08 DIAGNOSIS — Z8546 Personal history of malignant neoplasm of prostate: Secondary | ICD-10-CM | POA: Diagnosis not present

## 2014-03-08 DIAGNOSIS — E780 Pure hypercholesterolemia: Secondary | ICD-10-CM | POA: Diagnosis not present

## 2014-03-08 DIAGNOSIS — I10 Essential (primary) hypertension: Secondary | ICD-10-CM | POA: Diagnosis not present

## 2014-03-08 DIAGNOSIS — G35 Multiple sclerosis: Secondary | ICD-10-CM | POA: Diagnosis not present

## 2014-03-08 DIAGNOSIS — R296 Repeated falls: Secondary | ICD-10-CM | POA: Diagnosis not present

## 2014-03-08 DIAGNOSIS — G939 Disorder of brain, unspecified: Secondary | ICD-10-CM | POA: Diagnosis not present

## 2014-03-09 DIAGNOSIS — G35 Multiple sclerosis: Secondary | ICD-10-CM | POA: Diagnosis not present

## 2014-03-09 DIAGNOSIS — R296 Repeated falls: Secondary | ICD-10-CM | POA: Diagnosis not present

## 2014-03-09 DIAGNOSIS — G939 Disorder of brain, unspecified: Secondary | ICD-10-CM | POA: Diagnosis not present

## 2014-03-09 DIAGNOSIS — Z8546 Personal history of malignant neoplasm of prostate: Secondary | ICD-10-CM | POA: Diagnosis not present

## 2014-03-09 DIAGNOSIS — E780 Pure hypercholesterolemia: Secondary | ICD-10-CM | POA: Diagnosis not present

## 2014-03-09 DIAGNOSIS — I10 Essential (primary) hypertension: Secondary | ICD-10-CM | POA: Diagnosis not present

## 2014-03-10 DIAGNOSIS — R296 Repeated falls: Secondary | ICD-10-CM | POA: Diagnosis not present

## 2014-03-10 DIAGNOSIS — Z8546 Personal history of malignant neoplasm of prostate: Secondary | ICD-10-CM | POA: Diagnosis not present

## 2014-03-10 DIAGNOSIS — G939 Disorder of brain, unspecified: Secondary | ICD-10-CM | POA: Diagnosis not present

## 2014-03-10 DIAGNOSIS — G35 Multiple sclerosis: Secondary | ICD-10-CM | POA: Diagnosis not present

## 2014-03-10 DIAGNOSIS — I10 Essential (primary) hypertension: Secondary | ICD-10-CM | POA: Diagnosis not present

## 2014-03-10 DIAGNOSIS — E780 Pure hypercholesterolemia: Secondary | ICD-10-CM | POA: Diagnosis not present

## 2014-03-11 DIAGNOSIS — Z8546 Personal history of malignant neoplasm of prostate: Secondary | ICD-10-CM | POA: Diagnosis not present

## 2014-03-11 DIAGNOSIS — G35 Multiple sclerosis: Secondary | ICD-10-CM | POA: Diagnosis not present

## 2014-03-11 DIAGNOSIS — R296 Repeated falls: Secondary | ICD-10-CM | POA: Diagnosis not present

## 2014-03-11 DIAGNOSIS — I10 Essential (primary) hypertension: Secondary | ICD-10-CM | POA: Diagnosis not present

## 2014-03-11 DIAGNOSIS — E780 Pure hypercholesterolemia: Secondary | ICD-10-CM | POA: Diagnosis not present

## 2014-03-11 DIAGNOSIS — G939 Disorder of brain, unspecified: Secondary | ICD-10-CM | POA: Diagnosis not present

## 2014-03-12 DIAGNOSIS — Z8546 Personal history of malignant neoplasm of prostate: Secondary | ICD-10-CM | POA: Diagnosis not present

## 2014-03-12 DIAGNOSIS — E780 Pure hypercholesterolemia: Secondary | ICD-10-CM | POA: Diagnosis not present

## 2014-03-12 DIAGNOSIS — I10 Essential (primary) hypertension: Secondary | ICD-10-CM | POA: Diagnosis not present

## 2014-03-12 DIAGNOSIS — G939 Disorder of brain, unspecified: Secondary | ICD-10-CM | POA: Diagnosis not present

## 2014-03-12 DIAGNOSIS — G35 Multiple sclerosis: Secondary | ICD-10-CM | POA: Diagnosis not present

## 2014-03-12 DIAGNOSIS — R296 Repeated falls: Secondary | ICD-10-CM | POA: Diagnosis not present

## 2014-03-15 DIAGNOSIS — I10 Essential (primary) hypertension: Secondary | ICD-10-CM | POA: Diagnosis not present

## 2014-03-15 DIAGNOSIS — E780 Pure hypercholesterolemia: Secondary | ICD-10-CM | POA: Diagnosis not present

## 2014-03-15 DIAGNOSIS — Z8546 Personal history of malignant neoplasm of prostate: Secondary | ICD-10-CM | POA: Diagnosis not present

## 2014-03-15 DIAGNOSIS — R296 Repeated falls: Secondary | ICD-10-CM | POA: Diagnosis not present

## 2014-03-15 DIAGNOSIS — G35 Multiple sclerosis: Secondary | ICD-10-CM | POA: Diagnosis not present

## 2014-03-15 DIAGNOSIS — G939 Disorder of brain, unspecified: Secondary | ICD-10-CM | POA: Diagnosis not present

## 2014-03-17 DIAGNOSIS — R296 Repeated falls: Secondary | ICD-10-CM | POA: Diagnosis not present

## 2014-03-17 DIAGNOSIS — I10 Essential (primary) hypertension: Secondary | ICD-10-CM | POA: Diagnosis not present

## 2014-03-17 DIAGNOSIS — G35 Multiple sclerosis: Secondary | ICD-10-CM | POA: Diagnosis not present

## 2014-03-17 DIAGNOSIS — G939 Disorder of brain, unspecified: Secondary | ICD-10-CM | POA: Diagnosis not present

## 2014-03-17 DIAGNOSIS — Z8546 Personal history of malignant neoplasm of prostate: Secondary | ICD-10-CM | POA: Diagnosis not present

## 2014-03-17 DIAGNOSIS — E780 Pure hypercholesterolemia: Secondary | ICD-10-CM | POA: Diagnosis not present

## 2014-03-18 ENCOUNTER — Encounter: Payer: Self-pay | Admitting: Internal Medicine

## 2014-03-23 DIAGNOSIS — E780 Pure hypercholesterolemia: Secondary | ICD-10-CM | POA: Diagnosis not present

## 2014-03-23 DIAGNOSIS — G35 Multiple sclerosis: Secondary | ICD-10-CM | POA: Diagnosis not present

## 2014-03-23 DIAGNOSIS — Z8546 Personal history of malignant neoplasm of prostate: Secondary | ICD-10-CM | POA: Diagnosis not present

## 2014-03-23 DIAGNOSIS — G939 Disorder of brain, unspecified: Secondary | ICD-10-CM | POA: Diagnosis not present

## 2014-03-23 DIAGNOSIS — R296 Repeated falls: Secondary | ICD-10-CM | POA: Diagnosis not present

## 2014-03-23 DIAGNOSIS — I10 Essential (primary) hypertension: Secondary | ICD-10-CM | POA: Diagnosis not present

## 2014-03-24 DIAGNOSIS — Z8546 Personal history of malignant neoplasm of prostate: Secondary | ICD-10-CM | POA: Diagnosis not present

## 2014-03-24 DIAGNOSIS — R296 Repeated falls: Secondary | ICD-10-CM | POA: Diagnosis not present

## 2014-03-24 DIAGNOSIS — E780 Pure hypercholesterolemia: Secondary | ICD-10-CM | POA: Diagnosis not present

## 2014-03-24 DIAGNOSIS — G939 Disorder of brain, unspecified: Secondary | ICD-10-CM | POA: Diagnosis not present

## 2014-03-24 DIAGNOSIS — G35 Multiple sclerosis: Secondary | ICD-10-CM | POA: Diagnosis not present

## 2014-03-24 DIAGNOSIS — I10 Essential (primary) hypertension: Secondary | ICD-10-CM | POA: Diagnosis not present

## 2014-03-26 ENCOUNTER — Ambulatory Visit: Payer: Self-pay | Admitting: Oncology

## 2014-03-26 DIAGNOSIS — Z87891 Personal history of nicotine dependence: Secondary | ICD-10-CM | POA: Diagnosis not present

## 2014-03-26 DIAGNOSIS — E780 Pure hypercholesterolemia: Secondary | ICD-10-CM | POA: Diagnosis not present

## 2014-03-26 DIAGNOSIS — G939 Disorder of brain, unspecified: Secondary | ICD-10-CM | POA: Diagnosis not present

## 2014-03-26 DIAGNOSIS — D519 Vitamin B12 deficiency anemia, unspecified: Secondary | ICD-10-CM | POA: Diagnosis not present

## 2014-03-26 DIAGNOSIS — I10 Essential (primary) hypertension: Secondary | ICD-10-CM | POA: Diagnosis not present

## 2014-03-26 DIAGNOSIS — Z8546 Personal history of malignant neoplasm of prostate: Secondary | ICD-10-CM | POA: Diagnosis not present

## 2014-03-26 DIAGNOSIS — R296 Repeated falls: Secondary | ICD-10-CM | POA: Diagnosis not present

## 2014-03-26 DIAGNOSIS — G35 Multiple sclerosis: Secondary | ICD-10-CM | POA: Diagnosis not present

## 2014-03-29 DIAGNOSIS — R296 Repeated falls: Secondary | ICD-10-CM | POA: Diagnosis not present

## 2014-03-29 DIAGNOSIS — Z8546 Personal history of malignant neoplasm of prostate: Secondary | ICD-10-CM | POA: Diagnosis not present

## 2014-03-29 DIAGNOSIS — G939 Disorder of brain, unspecified: Secondary | ICD-10-CM | POA: Diagnosis not present

## 2014-03-29 DIAGNOSIS — I10 Essential (primary) hypertension: Secondary | ICD-10-CM | POA: Diagnosis not present

## 2014-03-29 DIAGNOSIS — G35 Multiple sclerosis: Secondary | ICD-10-CM | POA: Diagnosis not present

## 2014-03-29 DIAGNOSIS — E780 Pure hypercholesterolemia: Secondary | ICD-10-CM | POA: Diagnosis not present

## 2014-03-30 DIAGNOSIS — I10 Essential (primary) hypertension: Secondary | ICD-10-CM | POA: Diagnosis not present

## 2014-03-30 DIAGNOSIS — Z8546 Personal history of malignant neoplasm of prostate: Secondary | ICD-10-CM | POA: Diagnosis not present

## 2014-03-30 DIAGNOSIS — G35 Multiple sclerosis: Secondary | ICD-10-CM | POA: Diagnosis not present

## 2014-03-30 DIAGNOSIS — E780 Pure hypercholesterolemia: Secondary | ICD-10-CM | POA: Diagnosis not present

## 2014-03-30 DIAGNOSIS — R296 Repeated falls: Secondary | ICD-10-CM | POA: Diagnosis not present

## 2014-03-30 DIAGNOSIS — G939 Disorder of brain, unspecified: Secondary | ICD-10-CM | POA: Diagnosis not present

## 2014-03-31 DIAGNOSIS — Z8546 Personal history of malignant neoplasm of prostate: Secondary | ICD-10-CM | POA: Diagnosis not present

## 2014-03-31 DIAGNOSIS — I10 Essential (primary) hypertension: Secondary | ICD-10-CM | POA: Diagnosis not present

## 2014-03-31 DIAGNOSIS — G939 Disorder of brain, unspecified: Secondary | ICD-10-CM | POA: Diagnosis not present

## 2014-03-31 DIAGNOSIS — E780 Pure hypercholesterolemia: Secondary | ICD-10-CM | POA: Diagnosis not present

## 2014-03-31 DIAGNOSIS — R296 Repeated falls: Secondary | ICD-10-CM | POA: Diagnosis not present

## 2014-03-31 DIAGNOSIS — G35 Multiple sclerosis: Secondary | ICD-10-CM | POA: Diagnosis not present

## 2014-04-01 DIAGNOSIS — I10 Essential (primary) hypertension: Secondary | ICD-10-CM | POA: Diagnosis not present

## 2014-04-01 DIAGNOSIS — R296 Repeated falls: Secondary | ICD-10-CM | POA: Diagnosis not present

## 2014-04-01 DIAGNOSIS — Z8546 Personal history of malignant neoplasm of prostate: Secondary | ICD-10-CM | POA: Diagnosis not present

## 2014-04-01 DIAGNOSIS — G939 Disorder of brain, unspecified: Secondary | ICD-10-CM | POA: Diagnosis not present

## 2014-04-01 DIAGNOSIS — G35 Multiple sclerosis: Secondary | ICD-10-CM | POA: Diagnosis not present

## 2014-04-01 DIAGNOSIS — E780 Pure hypercholesterolemia: Secondary | ICD-10-CM | POA: Diagnosis not present

## 2014-04-02 DIAGNOSIS — Z8546 Personal history of malignant neoplasm of prostate: Secondary | ICD-10-CM | POA: Diagnosis not present

## 2014-04-02 DIAGNOSIS — I10 Essential (primary) hypertension: Secondary | ICD-10-CM | POA: Diagnosis not present

## 2014-04-02 DIAGNOSIS — G35 Multiple sclerosis: Secondary | ICD-10-CM | POA: Diagnosis not present

## 2014-04-02 DIAGNOSIS — R296 Repeated falls: Secondary | ICD-10-CM | POA: Diagnosis not present

## 2014-04-02 DIAGNOSIS — G939 Disorder of brain, unspecified: Secondary | ICD-10-CM | POA: Diagnosis not present

## 2014-04-02 DIAGNOSIS — E780 Pure hypercholesterolemia: Secondary | ICD-10-CM | POA: Diagnosis not present

## 2014-04-05 DIAGNOSIS — G939 Disorder of brain, unspecified: Secondary | ICD-10-CM | POA: Diagnosis not present

## 2014-04-05 DIAGNOSIS — I10 Essential (primary) hypertension: Secondary | ICD-10-CM | POA: Diagnosis not present

## 2014-04-05 DIAGNOSIS — E780 Pure hypercholesterolemia: Secondary | ICD-10-CM | POA: Diagnosis not present

## 2014-04-05 DIAGNOSIS — Z8546 Personal history of malignant neoplasm of prostate: Secondary | ICD-10-CM | POA: Diagnosis not present

## 2014-04-05 DIAGNOSIS — R296 Repeated falls: Secondary | ICD-10-CM | POA: Diagnosis not present

## 2014-04-05 DIAGNOSIS — G35 Multiple sclerosis: Secondary | ICD-10-CM | POA: Diagnosis not present

## 2014-04-07 DIAGNOSIS — E780 Pure hypercholesterolemia: Secondary | ICD-10-CM | POA: Diagnosis not present

## 2014-04-07 DIAGNOSIS — Z8546 Personal history of malignant neoplasm of prostate: Secondary | ICD-10-CM | POA: Diagnosis not present

## 2014-04-07 DIAGNOSIS — I10 Essential (primary) hypertension: Secondary | ICD-10-CM | POA: Diagnosis not present

## 2014-04-07 DIAGNOSIS — R296 Repeated falls: Secondary | ICD-10-CM | POA: Diagnosis not present

## 2014-04-07 DIAGNOSIS — G939 Disorder of brain, unspecified: Secondary | ICD-10-CM | POA: Diagnosis not present

## 2014-04-07 DIAGNOSIS — G35 Multiple sclerosis: Secondary | ICD-10-CM | POA: Diagnosis not present

## 2014-04-08 DIAGNOSIS — G939 Disorder of brain, unspecified: Secondary | ICD-10-CM | POA: Diagnosis not present

## 2014-04-08 DIAGNOSIS — G35 Multiple sclerosis: Secondary | ICD-10-CM | POA: Diagnosis not present

## 2014-04-08 DIAGNOSIS — R296 Repeated falls: Secondary | ICD-10-CM | POA: Diagnosis not present

## 2014-04-08 DIAGNOSIS — E780 Pure hypercholesterolemia: Secondary | ICD-10-CM | POA: Diagnosis not present

## 2014-04-08 DIAGNOSIS — I10 Essential (primary) hypertension: Secondary | ICD-10-CM | POA: Diagnosis not present

## 2014-04-08 DIAGNOSIS — Z8546 Personal history of malignant neoplasm of prostate: Secondary | ICD-10-CM | POA: Diagnosis not present

## 2014-04-09 DIAGNOSIS — E780 Pure hypercholesterolemia: Secondary | ICD-10-CM | POA: Diagnosis not present

## 2014-04-09 DIAGNOSIS — G939 Disorder of brain, unspecified: Secondary | ICD-10-CM | POA: Diagnosis not present

## 2014-04-09 DIAGNOSIS — R296 Repeated falls: Secondary | ICD-10-CM | POA: Diagnosis not present

## 2014-04-09 DIAGNOSIS — Z8546 Personal history of malignant neoplasm of prostate: Secondary | ICD-10-CM | POA: Diagnosis not present

## 2014-04-09 DIAGNOSIS — G35 Multiple sclerosis: Secondary | ICD-10-CM | POA: Diagnosis not present

## 2014-04-09 DIAGNOSIS — I10 Essential (primary) hypertension: Secondary | ICD-10-CM | POA: Diagnosis not present

## 2014-04-12 DIAGNOSIS — R296 Repeated falls: Secondary | ICD-10-CM | POA: Diagnosis not present

## 2014-04-12 DIAGNOSIS — G939 Disorder of brain, unspecified: Secondary | ICD-10-CM | POA: Diagnosis not present

## 2014-04-12 DIAGNOSIS — E780 Pure hypercholesterolemia: Secondary | ICD-10-CM | POA: Diagnosis not present

## 2014-04-12 DIAGNOSIS — Z8546 Personal history of malignant neoplasm of prostate: Secondary | ICD-10-CM | POA: Diagnosis not present

## 2014-04-12 DIAGNOSIS — G35 Multiple sclerosis: Secondary | ICD-10-CM | POA: Diagnosis not present

## 2014-04-12 DIAGNOSIS — I10 Essential (primary) hypertension: Secondary | ICD-10-CM | POA: Diagnosis not present

## 2014-04-13 DIAGNOSIS — Z8546 Personal history of malignant neoplasm of prostate: Secondary | ICD-10-CM | POA: Diagnosis not present

## 2014-04-13 DIAGNOSIS — E780 Pure hypercholesterolemia: Secondary | ICD-10-CM | POA: Diagnosis not present

## 2014-04-13 DIAGNOSIS — G35 Multiple sclerosis: Secondary | ICD-10-CM | POA: Diagnosis not present

## 2014-04-13 DIAGNOSIS — R296 Repeated falls: Secondary | ICD-10-CM | POA: Diagnosis not present

## 2014-04-13 DIAGNOSIS — I10 Essential (primary) hypertension: Secondary | ICD-10-CM | POA: Diagnosis not present

## 2014-04-13 DIAGNOSIS — G939 Disorder of brain, unspecified: Secondary | ICD-10-CM | POA: Diagnosis not present

## 2014-04-14 DIAGNOSIS — I10 Essential (primary) hypertension: Secondary | ICD-10-CM | POA: Diagnosis not present

## 2014-04-14 DIAGNOSIS — E780 Pure hypercholesterolemia: Secondary | ICD-10-CM | POA: Diagnosis not present

## 2014-04-14 DIAGNOSIS — R296 Repeated falls: Secondary | ICD-10-CM | POA: Diagnosis not present

## 2014-04-14 DIAGNOSIS — Z8546 Personal history of malignant neoplasm of prostate: Secondary | ICD-10-CM | POA: Diagnosis not present

## 2014-04-14 DIAGNOSIS — G35 Multiple sclerosis: Secondary | ICD-10-CM | POA: Diagnosis not present

## 2014-04-14 DIAGNOSIS — G939 Disorder of brain, unspecified: Secondary | ICD-10-CM | POA: Diagnosis not present

## 2014-04-14 DIAGNOSIS — R948 Abnormal results of function studies of other organs and systems: Secondary | ICD-10-CM | POA: Diagnosis not present

## 2014-04-14 LAB — COMPREHENSIVE METABOLIC PANEL
ALK PHOS: 80 U/L
ALT: 24 U/L
Albumin: 3.4 g/dL (ref 3.4–5.0)
Anion Gap: 8 (ref 7–16)
BILIRUBIN TOTAL: 0.6 mg/dL (ref 0.2–1.0)
BUN: 19 mg/dL — AB (ref 7–18)
CHLORIDE: 99 mmol/L (ref 98–107)
CO2: 28 mmol/L (ref 21–32)
CREATININE: 1.29 mg/dL (ref 0.60–1.30)
Calcium, Total: 8.5 mg/dL (ref 8.5–10.1)
EGFR (African American): >60
EGFR (Non-African Amer.): 59 — ABNORMAL LOW
GLUCOSE: 109 mg/dL — AB (ref 65–99)
OSMOLALITY: 273 (ref 275–301)
POTASSIUM: 3.5 mmol/L (ref 3.5–5.1)
SGOT(AST): 12 U/L — ABNORMAL LOW (ref 15–37)
Sodium: 135 mmol/L — ABNORMAL LOW (ref 136–145)
TOTAL PROTEIN: 6.5 g/dL (ref 6.4–8.2)

## 2014-04-14 LAB — CBC CANCER CENTER
BASOS ABS: 0.1 x10 3/mm (ref 0.0–0.1)
Basophil %: 0.5 %
EOS ABS: 0 x10 3/mm (ref 0.0–0.7)
Eosinophil %: 0 %
HCT: 43.1 % (ref 40.0–52.0)
HGB: 14.4 g/dL (ref 13.0–18.0)
LYMPHS ABS: 0.8 x10 3/mm — AB (ref 1.0–3.6)
Lymphocyte %: 6.8 %
MCH: 30.5 pg (ref 26.0–34.0)
MCHC: 33.3 g/dL (ref 32.0–36.0)
MCV: 92 fL (ref 80–100)
Monocyte #: 0.3 x10 3/mm (ref 0.2–1.0)
Monocyte %: 2.8 %
Neutrophil #: 10.2 x10 3/mm — ABNORMAL HIGH (ref 1.4–6.5)
Neutrophil %: 89.9 %
Platelet: 230 x10 3/mm (ref 150–440)
RBC: 4.71 10*6/uL (ref 4.40–5.90)
RDW: 14.9 % — AB (ref 11.5–14.5)
WBC: 11.4 x10 3/mm — ABNORMAL HIGH (ref 3.8–10.6)

## 2014-04-19 DIAGNOSIS — G939 Disorder of brain, unspecified: Secondary | ICD-10-CM | POA: Diagnosis not present

## 2014-04-19 DIAGNOSIS — E780 Pure hypercholesterolemia: Secondary | ICD-10-CM | POA: Diagnosis not present

## 2014-04-19 DIAGNOSIS — G35 Multiple sclerosis: Secondary | ICD-10-CM | POA: Diagnosis not present

## 2014-04-19 DIAGNOSIS — R296 Repeated falls: Secondary | ICD-10-CM | POA: Diagnosis not present

## 2014-04-19 DIAGNOSIS — I10 Essential (primary) hypertension: Secondary | ICD-10-CM | POA: Diagnosis not present

## 2014-04-19 DIAGNOSIS — Z8546 Personal history of malignant neoplasm of prostate: Secondary | ICD-10-CM | POA: Diagnosis not present

## 2014-04-20 DIAGNOSIS — I10 Essential (primary) hypertension: Secondary | ICD-10-CM | POA: Diagnosis not present

## 2014-04-20 DIAGNOSIS — G939 Disorder of brain, unspecified: Secondary | ICD-10-CM | POA: Diagnosis not present

## 2014-04-20 DIAGNOSIS — E780 Pure hypercholesterolemia: Secondary | ICD-10-CM | POA: Diagnosis not present

## 2014-04-20 DIAGNOSIS — Z8546 Personal history of malignant neoplasm of prostate: Secondary | ICD-10-CM | POA: Diagnosis not present

## 2014-04-20 DIAGNOSIS — R296 Repeated falls: Secondary | ICD-10-CM | POA: Diagnosis not present

## 2014-04-20 DIAGNOSIS — G35 Multiple sclerosis: Secondary | ICD-10-CM | POA: Diagnosis not present

## 2014-04-21 DIAGNOSIS — R296 Repeated falls: Secondary | ICD-10-CM | POA: Diagnosis not present

## 2014-04-21 DIAGNOSIS — G939 Disorder of brain, unspecified: Secondary | ICD-10-CM | POA: Diagnosis not present

## 2014-04-21 DIAGNOSIS — G35 Multiple sclerosis: Secondary | ICD-10-CM | POA: Diagnosis not present

## 2014-04-21 DIAGNOSIS — Z8546 Personal history of malignant neoplasm of prostate: Secondary | ICD-10-CM | POA: Diagnosis not present

## 2014-04-21 DIAGNOSIS — I10 Essential (primary) hypertension: Secondary | ICD-10-CM | POA: Diagnosis not present

## 2014-04-21 DIAGNOSIS — E780 Pure hypercholesterolemia: Secondary | ICD-10-CM | POA: Diagnosis not present

## 2014-04-23 DIAGNOSIS — I10 Essential (primary) hypertension: Secondary | ICD-10-CM | POA: Diagnosis not present

## 2014-04-23 DIAGNOSIS — G939 Disorder of brain, unspecified: Secondary | ICD-10-CM | POA: Diagnosis not present

## 2014-04-23 DIAGNOSIS — G35 Multiple sclerosis: Secondary | ICD-10-CM | POA: Diagnosis not present

## 2014-04-23 DIAGNOSIS — Z8546 Personal history of malignant neoplasm of prostate: Secondary | ICD-10-CM | POA: Diagnosis not present

## 2014-04-23 DIAGNOSIS — R296 Repeated falls: Secondary | ICD-10-CM | POA: Diagnosis not present

## 2014-04-23 DIAGNOSIS — E780 Pure hypercholesterolemia: Secondary | ICD-10-CM | POA: Diagnosis not present

## 2014-04-26 ENCOUNTER — Ambulatory Visit: Payer: Self-pay | Admitting: Oncology

## 2014-04-26 ENCOUNTER — Ambulatory Visit: Payer: Self-pay | Admitting: Internal Medicine

## 2014-04-26 DIAGNOSIS — D519 Vitamin B12 deficiency anemia, unspecified: Secondary | ICD-10-CM | POA: Diagnosis not present

## 2014-04-26 DIAGNOSIS — G939 Disorder of brain, unspecified: Secondary | ICD-10-CM | POA: Diagnosis not present

## 2014-04-26 DIAGNOSIS — I10 Essential (primary) hypertension: Secondary | ICD-10-CM | POA: Diagnosis not present

## 2014-04-26 DIAGNOSIS — Z87891 Personal history of nicotine dependence: Secondary | ICD-10-CM | POA: Diagnosis not present

## 2014-04-26 DIAGNOSIS — R296 Repeated falls: Secondary | ICD-10-CM | POA: Diagnosis not present

## 2014-04-26 DIAGNOSIS — E780 Pure hypercholesterolemia: Secondary | ICD-10-CM | POA: Diagnosis not present

## 2014-04-26 DIAGNOSIS — G35 Multiple sclerosis: Secondary | ICD-10-CM | POA: Diagnosis not present

## 2014-04-26 DIAGNOSIS — Z8546 Personal history of malignant neoplasm of prostate: Secondary | ICD-10-CM | POA: Diagnosis not present

## 2014-04-27 DIAGNOSIS — I10 Essential (primary) hypertension: Secondary | ICD-10-CM | POA: Diagnosis not present

## 2014-04-27 DIAGNOSIS — R296 Repeated falls: Secondary | ICD-10-CM | POA: Diagnosis not present

## 2014-04-27 DIAGNOSIS — E780 Pure hypercholesterolemia: Secondary | ICD-10-CM | POA: Diagnosis not present

## 2014-04-27 DIAGNOSIS — G939 Disorder of brain, unspecified: Secondary | ICD-10-CM | POA: Diagnosis not present

## 2014-04-27 DIAGNOSIS — Z8546 Personal history of malignant neoplasm of prostate: Secondary | ICD-10-CM | POA: Diagnosis not present

## 2014-04-27 DIAGNOSIS — G35 Multiple sclerosis: Secondary | ICD-10-CM | POA: Diagnosis not present

## 2014-04-28 DIAGNOSIS — Z8546 Personal history of malignant neoplasm of prostate: Secondary | ICD-10-CM | POA: Diagnosis not present

## 2014-04-28 DIAGNOSIS — R296 Repeated falls: Secondary | ICD-10-CM | POA: Diagnosis not present

## 2014-04-28 DIAGNOSIS — I10 Essential (primary) hypertension: Secondary | ICD-10-CM | POA: Diagnosis not present

## 2014-04-28 DIAGNOSIS — E780 Pure hypercholesterolemia: Secondary | ICD-10-CM | POA: Diagnosis not present

## 2014-04-28 DIAGNOSIS — G939 Disorder of brain, unspecified: Secondary | ICD-10-CM | POA: Diagnosis not present

## 2014-04-28 DIAGNOSIS — G35 Multiple sclerosis: Secondary | ICD-10-CM | POA: Diagnosis not present

## 2014-04-30 DIAGNOSIS — I10 Essential (primary) hypertension: Secondary | ICD-10-CM | POA: Diagnosis not present

## 2014-04-30 DIAGNOSIS — Z8546 Personal history of malignant neoplasm of prostate: Secondary | ICD-10-CM | POA: Diagnosis not present

## 2014-04-30 DIAGNOSIS — R296 Repeated falls: Secondary | ICD-10-CM | POA: Diagnosis not present

## 2014-04-30 DIAGNOSIS — G939 Disorder of brain, unspecified: Secondary | ICD-10-CM | POA: Diagnosis not present

## 2014-04-30 DIAGNOSIS — E780 Pure hypercholesterolemia: Secondary | ICD-10-CM | POA: Diagnosis not present

## 2014-04-30 DIAGNOSIS — G35 Multiple sclerosis: Secondary | ICD-10-CM | POA: Diagnosis not present

## 2014-05-03 DIAGNOSIS — G35 Multiple sclerosis: Secondary | ICD-10-CM | POA: Diagnosis not present

## 2014-05-03 DIAGNOSIS — R296 Repeated falls: Secondary | ICD-10-CM | POA: Diagnosis not present

## 2014-05-03 DIAGNOSIS — E780 Pure hypercholesterolemia: Secondary | ICD-10-CM | POA: Diagnosis not present

## 2014-05-03 DIAGNOSIS — Z8546 Personal history of malignant neoplasm of prostate: Secondary | ICD-10-CM | POA: Diagnosis not present

## 2014-05-03 DIAGNOSIS — G939 Disorder of brain, unspecified: Secondary | ICD-10-CM | POA: Diagnosis not present

## 2014-05-03 DIAGNOSIS — I10 Essential (primary) hypertension: Secondary | ICD-10-CM | POA: Diagnosis not present

## 2014-05-04 ENCOUNTER — Telehealth: Payer: Self-pay | Admitting: *Deleted

## 2014-05-04 DIAGNOSIS — I10 Essential (primary) hypertension: Secondary | ICD-10-CM | POA: Diagnosis not present

## 2014-05-04 DIAGNOSIS — G939 Disorder of brain, unspecified: Secondary | ICD-10-CM | POA: Diagnosis not present

## 2014-05-04 DIAGNOSIS — R296 Repeated falls: Secondary | ICD-10-CM | POA: Diagnosis not present

## 2014-05-04 DIAGNOSIS — G35 Multiple sclerosis: Secondary | ICD-10-CM | POA: Diagnosis not present

## 2014-05-04 DIAGNOSIS — Z8546 Personal history of malignant neoplasm of prostate: Secondary | ICD-10-CM | POA: Diagnosis not present

## 2014-05-04 DIAGNOSIS — E780 Pure hypercholesterolemia: Secondary | ICD-10-CM | POA: Diagnosis not present

## 2014-05-04 MED ORDER — CYANOCOBALAMIN 1000 MCG/ML IJ SOLN
1000.0000 ug | Freq: Once | INTRAMUSCULAR | Status: DC
Start: 2014-05-04 — End: 2014-09-21

## 2014-05-04 NOTE — Telephone Encounter (Signed)
Spoke with Seth Bake, she states she is an Therapist, sports with [facility].  Please advise

## 2014-05-04 NOTE — Telephone Encounter (Signed)
Philip Gordon called states spt is getting weaker and is unable to come into the office for B12 injections.  Philip Gordon is requesting a Rx be sent to [facility] on [address] to be administered by her in the home.  Please advise

## 2014-05-04 NOTE — Telephone Encounter (Signed)
I do not mind sending in rx for B12, but need to make sure she knows proper way to give B12 injection.

## 2014-05-04 NOTE — Telephone Encounter (Signed)
Sent in rx for B12.  Please notify.

## 2014-05-05 DIAGNOSIS — G939 Disorder of brain, unspecified: Secondary | ICD-10-CM | POA: Diagnosis not present

## 2014-05-05 DIAGNOSIS — R296 Repeated falls: Secondary | ICD-10-CM | POA: Diagnosis not present

## 2014-05-05 DIAGNOSIS — G35 Multiple sclerosis: Secondary | ICD-10-CM | POA: Diagnosis not present

## 2014-05-05 DIAGNOSIS — Z8546 Personal history of malignant neoplasm of prostate: Secondary | ICD-10-CM | POA: Diagnosis not present

## 2014-05-05 DIAGNOSIS — E780 Pure hypercholesterolemia: Secondary | ICD-10-CM | POA: Diagnosis not present

## 2014-05-05 DIAGNOSIS — I10 Essential (primary) hypertension: Secondary | ICD-10-CM | POA: Diagnosis not present

## 2014-05-05 NOTE — Telephone Encounter (Signed)
Spoke with Seth Bake, advised her Rx sent to pharmacy

## 2014-05-07 DIAGNOSIS — R296 Repeated falls: Secondary | ICD-10-CM | POA: Diagnosis not present

## 2014-05-07 DIAGNOSIS — I10 Essential (primary) hypertension: Secondary | ICD-10-CM | POA: Diagnosis not present

## 2014-05-07 DIAGNOSIS — Z8546 Personal history of malignant neoplasm of prostate: Secondary | ICD-10-CM | POA: Diagnosis not present

## 2014-05-07 DIAGNOSIS — E780 Pure hypercholesterolemia: Secondary | ICD-10-CM | POA: Diagnosis not present

## 2014-05-07 DIAGNOSIS — G939 Disorder of brain, unspecified: Secondary | ICD-10-CM | POA: Diagnosis not present

## 2014-05-07 DIAGNOSIS — G35 Multiple sclerosis: Secondary | ICD-10-CM | POA: Diagnosis not present

## 2014-05-10 ENCOUNTER — Ambulatory Visit: Payer: Medicare Other | Admitting: Internal Medicine

## 2014-05-10 DIAGNOSIS — Z8546 Personal history of malignant neoplasm of prostate: Secondary | ICD-10-CM | POA: Diagnosis not present

## 2014-05-10 DIAGNOSIS — R296 Repeated falls: Secondary | ICD-10-CM | POA: Diagnosis not present

## 2014-05-10 DIAGNOSIS — I10 Essential (primary) hypertension: Secondary | ICD-10-CM | POA: Diagnosis not present

## 2014-05-10 DIAGNOSIS — G35 Multiple sclerosis: Secondary | ICD-10-CM | POA: Diagnosis not present

## 2014-05-10 DIAGNOSIS — E780 Pure hypercholesterolemia: Secondary | ICD-10-CM | POA: Diagnosis not present

## 2014-05-10 DIAGNOSIS — G939 Disorder of brain, unspecified: Secondary | ICD-10-CM | POA: Diagnosis not present

## 2014-05-11 DIAGNOSIS — G35 Multiple sclerosis: Secondary | ICD-10-CM | POA: Diagnosis not present

## 2014-05-11 DIAGNOSIS — R296 Repeated falls: Secondary | ICD-10-CM | POA: Diagnosis not present

## 2014-05-11 DIAGNOSIS — Z8546 Personal history of malignant neoplasm of prostate: Secondary | ICD-10-CM | POA: Diagnosis not present

## 2014-05-11 DIAGNOSIS — G939 Disorder of brain, unspecified: Secondary | ICD-10-CM | POA: Diagnosis not present

## 2014-05-11 DIAGNOSIS — I10 Essential (primary) hypertension: Secondary | ICD-10-CM | POA: Diagnosis not present

## 2014-05-11 DIAGNOSIS — E780 Pure hypercholesterolemia: Secondary | ICD-10-CM | POA: Diagnosis not present

## 2014-05-12 DIAGNOSIS — R296 Repeated falls: Secondary | ICD-10-CM | POA: Diagnosis not present

## 2014-05-12 DIAGNOSIS — G35 Multiple sclerosis: Secondary | ICD-10-CM | POA: Diagnosis not present

## 2014-05-12 DIAGNOSIS — Z8546 Personal history of malignant neoplasm of prostate: Secondary | ICD-10-CM | POA: Diagnosis not present

## 2014-05-12 DIAGNOSIS — E780 Pure hypercholesterolemia: Secondary | ICD-10-CM | POA: Diagnosis not present

## 2014-05-12 DIAGNOSIS — G939 Disorder of brain, unspecified: Secondary | ICD-10-CM | POA: Diagnosis not present

## 2014-05-12 DIAGNOSIS — I10 Essential (primary) hypertension: Secondary | ICD-10-CM | POA: Diagnosis not present

## 2014-05-14 DIAGNOSIS — I10 Essential (primary) hypertension: Secondary | ICD-10-CM | POA: Diagnosis not present

## 2014-05-14 DIAGNOSIS — R296 Repeated falls: Secondary | ICD-10-CM | POA: Diagnosis not present

## 2014-05-14 DIAGNOSIS — G939 Disorder of brain, unspecified: Secondary | ICD-10-CM | POA: Diagnosis not present

## 2014-05-14 DIAGNOSIS — G35 Multiple sclerosis: Secondary | ICD-10-CM | POA: Diagnosis not present

## 2014-05-14 DIAGNOSIS — Z8546 Personal history of malignant neoplasm of prostate: Secondary | ICD-10-CM | POA: Diagnosis not present

## 2014-05-14 DIAGNOSIS — E780 Pure hypercholesterolemia: Secondary | ICD-10-CM | POA: Diagnosis not present

## 2014-05-15 DIAGNOSIS — G35 Multiple sclerosis: Secondary | ICD-10-CM | POA: Diagnosis not present

## 2014-05-15 DIAGNOSIS — G939 Disorder of brain, unspecified: Secondary | ICD-10-CM | POA: Diagnosis not present

## 2014-05-15 DIAGNOSIS — I10 Essential (primary) hypertension: Secondary | ICD-10-CM | POA: Diagnosis not present

## 2014-05-15 DIAGNOSIS — Z8546 Personal history of malignant neoplasm of prostate: Secondary | ICD-10-CM | POA: Diagnosis not present

## 2014-05-15 DIAGNOSIS — E780 Pure hypercholesterolemia: Secondary | ICD-10-CM | POA: Diagnosis not present

## 2014-05-15 DIAGNOSIS — R296 Repeated falls: Secondary | ICD-10-CM | POA: Diagnosis not present

## 2014-05-17 DIAGNOSIS — G35 Multiple sclerosis: Secondary | ICD-10-CM | POA: Diagnosis not present

## 2014-05-17 DIAGNOSIS — E780 Pure hypercholesterolemia: Secondary | ICD-10-CM | POA: Diagnosis not present

## 2014-05-17 DIAGNOSIS — I10 Essential (primary) hypertension: Secondary | ICD-10-CM | POA: Diagnosis not present

## 2014-05-17 DIAGNOSIS — R296 Repeated falls: Secondary | ICD-10-CM | POA: Diagnosis not present

## 2014-05-17 DIAGNOSIS — G939 Disorder of brain, unspecified: Secondary | ICD-10-CM | POA: Diagnosis not present

## 2014-05-17 DIAGNOSIS — Z8546 Personal history of malignant neoplasm of prostate: Secondary | ICD-10-CM | POA: Diagnosis not present

## 2014-05-18 DIAGNOSIS — Z8546 Personal history of malignant neoplasm of prostate: Secondary | ICD-10-CM | POA: Diagnosis not present

## 2014-05-18 DIAGNOSIS — G35 Multiple sclerosis: Secondary | ICD-10-CM | POA: Diagnosis not present

## 2014-05-18 DIAGNOSIS — E780 Pure hypercholesterolemia: Secondary | ICD-10-CM | POA: Diagnosis not present

## 2014-05-18 DIAGNOSIS — R296 Repeated falls: Secondary | ICD-10-CM | POA: Diagnosis not present

## 2014-05-18 DIAGNOSIS — I10 Essential (primary) hypertension: Secondary | ICD-10-CM | POA: Diagnosis not present

## 2014-05-18 DIAGNOSIS — G939 Disorder of brain, unspecified: Secondary | ICD-10-CM | POA: Diagnosis not present

## 2014-05-19 DIAGNOSIS — E780 Pure hypercholesterolemia: Secondary | ICD-10-CM | POA: Diagnosis not present

## 2014-05-19 DIAGNOSIS — G939 Disorder of brain, unspecified: Secondary | ICD-10-CM | POA: Diagnosis not present

## 2014-05-19 DIAGNOSIS — I10 Essential (primary) hypertension: Secondary | ICD-10-CM | POA: Diagnosis not present

## 2014-05-19 DIAGNOSIS — R296 Repeated falls: Secondary | ICD-10-CM | POA: Diagnosis not present

## 2014-05-19 DIAGNOSIS — Z8546 Personal history of malignant neoplasm of prostate: Secondary | ICD-10-CM | POA: Diagnosis not present

## 2014-05-19 DIAGNOSIS — G35 Multiple sclerosis: Secondary | ICD-10-CM | POA: Diagnosis not present

## 2014-05-21 DIAGNOSIS — G939 Disorder of brain, unspecified: Secondary | ICD-10-CM | POA: Diagnosis not present

## 2014-05-21 DIAGNOSIS — Z8546 Personal history of malignant neoplasm of prostate: Secondary | ICD-10-CM | POA: Diagnosis not present

## 2014-05-21 DIAGNOSIS — E780 Pure hypercholesterolemia: Secondary | ICD-10-CM | POA: Diagnosis not present

## 2014-05-21 DIAGNOSIS — R296 Repeated falls: Secondary | ICD-10-CM | POA: Diagnosis not present

## 2014-05-21 DIAGNOSIS — G35 Multiple sclerosis: Secondary | ICD-10-CM | POA: Diagnosis not present

## 2014-05-21 DIAGNOSIS — I10 Essential (primary) hypertension: Secondary | ICD-10-CM | POA: Diagnosis not present

## 2014-05-24 DIAGNOSIS — R296 Repeated falls: Secondary | ICD-10-CM | POA: Diagnosis not present

## 2014-05-24 DIAGNOSIS — G35 Multiple sclerosis: Secondary | ICD-10-CM | POA: Diagnosis not present

## 2014-05-24 DIAGNOSIS — I10 Essential (primary) hypertension: Secondary | ICD-10-CM | POA: Diagnosis not present

## 2014-05-24 DIAGNOSIS — G939 Disorder of brain, unspecified: Secondary | ICD-10-CM | POA: Diagnosis not present

## 2014-05-24 DIAGNOSIS — E780 Pure hypercholesterolemia: Secondary | ICD-10-CM | POA: Diagnosis not present

## 2014-05-24 DIAGNOSIS — Z8546 Personal history of malignant neoplasm of prostate: Secondary | ICD-10-CM | POA: Diagnosis not present

## 2014-05-25 DIAGNOSIS — G35 Multiple sclerosis: Secondary | ICD-10-CM | POA: Diagnosis not present

## 2014-05-25 DIAGNOSIS — D519 Vitamin B12 deficiency anemia, unspecified: Secondary | ICD-10-CM | POA: Diagnosis not present

## 2014-05-25 DIAGNOSIS — E780 Pure hypercholesterolemia: Secondary | ICD-10-CM | POA: Diagnosis not present

## 2014-05-25 DIAGNOSIS — I10 Essential (primary) hypertension: Secondary | ICD-10-CM | POA: Diagnosis not present

## 2014-05-25 DIAGNOSIS — Z87891 Personal history of nicotine dependence: Secondary | ICD-10-CM | POA: Diagnosis not present

## 2014-05-25 DIAGNOSIS — G939 Disorder of brain, unspecified: Secondary | ICD-10-CM | POA: Diagnosis not present

## 2014-05-25 DIAGNOSIS — R296 Repeated falls: Secondary | ICD-10-CM | POA: Diagnosis not present

## 2014-05-25 DIAGNOSIS — Z8546 Personal history of malignant neoplasm of prostate: Secondary | ICD-10-CM | POA: Diagnosis not present

## 2014-05-26 DIAGNOSIS — Z8546 Personal history of malignant neoplasm of prostate: Secondary | ICD-10-CM | POA: Diagnosis not present

## 2014-05-26 DIAGNOSIS — I10 Essential (primary) hypertension: Secondary | ICD-10-CM | POA: Diagnosis not present

## 2014-05-26 DIAGNOSIS — R296 Repeated falls: Secondary | ICD-10-CM | POA: Diagnosis not present

## 2014-05-26 DIAGNOSIS — G939 Disorder of brain, unspecified: Secondary | ICD-10-CM | POA: Diagnosis not present

## 2014-05-26 DIAGNOSIS — G35 Multiple sclerosis: Secondary | ICD-10-CM | POA: Diagnosis not present

## 2014-05-26 DIAGNOSIS — E780 Pure hypercholesterolemia: Secondary | ICD-10-CM | POA: Diagnosis not present

## 2014-05-28 DIAGNOSIS — G939 Disorder of brain, unspecified: Secondary | ICD-10-CM | POA: Diagnosis not present

## 2014-05-28 DIAGNOSIS — R296 Repeated falls: Secondary | ICD-10-CM | POA: Diagnosis not present

## 2014-05-28 DIAGNOSIS — I10 Essential (primary) hypertension: Secondary | ICD-10-CM | POA: Diagnosis not present

## 2014-05-28 DIAGNOSIS — Z8546 Personal history of malignant neoplasm of prostate: Secondary | ICD-10-CM | POA: Diagnosis not present

## 2014-05-28 DIAGNOSIS — G35 Multiple sclerosis: Secondary | ICD-10-CM | POA: Diagnosis not present

## 2014-05-28 DIAGNOSIS — E780 Pure hypercholesterolemia: Secondary | ICD-10-CM | POA: Diagnosis not present

## 2014-06-01 DIAGNOSIS — G939 Disorder of brain, unspecified: Secondary | ICD-10-CM | POA: Diagnosis not present

## 2014-06-01 DIAGNOSIS — E780 Pure hypercholesterolemia: Secondary | ICD-10-CM | POA: Diagnosis not present

## 2014-06-01 DIAGNOSIS — Z8546 Personal history of malignant neoplasm of prostate: Secondary | ICD-10-CM | POA: Diagnosis not present

## 2014-06-01 DIAGNOSIS — R296 Repeated falls: Secondary | ICD-10-CM | POA: Diagnosis not present

## 2014-06-01 DIAGNOSIS — I10 Essential (primary) hypertension: Secondary | ICD-10-CM | POA: Diagnosis not present

## 2014-06-01 DIAGNOSIS — G35 Multiple sclerosis: Secondary | ICD-10-CM | POA: Diagnosis not present

## 2014-06-02 DIAGNOSIS — G35 Multiple sclerosis: Secondary | ICD-10-CM | POA: Diagnosis not present

## 2014-06-02 DIAGNOSIS — E780 Pure hypercholesterolemia: Secondary | ICD-10-CM | POA: Diagnosis not present

## 2014-06-02 DIAGNOSIS — R296 Repeated falls: Secondary | ICD-10-CM | POA: Diagnosis not present

## 2014-06-02 DIAGNOSIS — Z8546 Personal history of malignant neoplasm of prostate: Secondary | ICD-10-CM | POA: Diagnosis not present

## 2014-06-02 DIAGNOSIS — I10 Essential (primary) hypertension: Secondary | ICD-10-CM | POA: Diagnosis not present

## 2014-06-02 DIAGNOSIS — G939 Disorder of brain, unspecified: Secondary | ICD-10-CM | POA: Diagnosis not present

## 2014-06-04 DIAGNOSIS — E780 Pure hypercholesterolemia: Secondary | ICD-10-CM | POA: Diagnosis not present

## 2014-06-04 DIAGNOSIS — G35 Multiple sclerosis: Secondary | ICD-10-CM | POA: Diagnosis not present

## 2014-06-04 DIAGNOSIS — G939 Disorder of brain, unspecified: Secondary | ICD-10-CM | POA: Diagnosis not present

## 2014-06-04 DIAGNOSIS — R296 Repeated falls: Secondary | ICD-10-CM | POA: Diagnosis not present

## 2014-06-04 DIAGNOSIS — Z8546 Personal history of malignant neoplasm of prostate: Secondary | ICD-10-CM | POA: Diagnosis not present

## 2014-06-04 DIAGNOSIS — I10 Essential (primary) hypertension: Secondary | ICD-10-CM | POA: Diagnosis not present

## 2014-06-07 DIAGNOSIS — G35 Multiple sclerosis: Secondary | ICD-10-CM | POA: Diagnosis not present

## 2014-06-07 DIAGNOSIS — Z8546 Personal history of malignant neoplasm of prostate: Secondary | ICD-10-CM | POA: Diagnosis not present

## 2014-06-07 DIAGNOSIS — I10 Essential (primary) hypertension: Secondary | ICD-10-CM | POA: Diagnosis not present

## 2014-06-07 DIAGNOSIS — R296 Repeated falls: Secondary | ICD-10-CM | POA: Diagnosis not present

## 2014-06-07 DIAGNOSIS — E780 Pure hypercholesterolemia: Secondary | ICD-10-CM | POA: Diagnosis not present

## 2014-06-07 DIAGNOSIS — G939 Disorder of brain, unspecified: Secondary | ICD-10-CM | POA: Diagnosis not present

## 2014-06-08 ENCOUNTER — Ambulatory Visit: Payer: Self-pay | Admitting: Oncology

## 2014-06-08 DIAGNOSIS — G35 Multiple sclerosis: Secondary | ICD-10-CM | POA: Diagnosis not present

## 2014-06-08 DIAGNOSIS — R296 Repeated falls: Secondary | ICD-10-CM | POA: Diagnosis not present

## 2014-06-08 DIAGNOSIS — E780 Pure hypercholesterolemia: Secondary | ICD-10-CM | POA: Diagnosis not present

## 2014-06-08 DIAGNOSIS — C61 Malignant neoplasm of prostate: Secondary | ICD-10-CM | POA: Diagnosis not present

## 2014-06-08 DIAGNOSIS — Z8546 Personal history of malignant neoplasm of prostate: Secondary | ICD-10-CM | POA: Diagnosis not present

## 2014-06-08 DIAGNOSIS — G9389 Other specified disorders of brain: Secondary | ICD-10-CM | POA: Diagnosis not present

## 2014-06-08 DIAGNOSIS — I10 Essential (primary) hypertension: Secondary | ICD-10-CM | POA: Diagnosis not present

## 2014-06-08 DIAGNOSIS — R4182 Altered mental status, unspecified: Secondary | ICD-10-CM | POA: Diagnosis not present

## 2014-06-08 DIAGNOSIS — G939 Disorder of brain, unspecified: Secondary | ICD-10-CM | POA: Diagnosis not present

## 2014-06-09 DIAGNOSIS — R296 Repeated falls: Secondary | ICD-10-CM | POA: Diagnosis not present

## 2014-06-09 DIAGNOSIS — G35 Multiple sclerosis: Secondary | ICD-10-CM | POA: Diagnosis not present

## 2014-06-09 DIAGNOSIS — I10 Essential (primary) hypertension: Secondary | ICD-10-CM | POA: Diagnosis not present

## 2014-06-09 DIAGNOSIS — G939 Disorder of brain, unspecified: Secondary | ICD-10-CM | POA: Diagnosis not present

## 2014-06-09 DIAGNOSIS — E780 Pure hypercholesterolemia: Secondary | ICD-10-CM | POA: Diagnosis not present

## 2014-06-09 DIAGNOSIS — Z8546 Personal history of malignant neoplasm of prostate: Secondary | ICD-10-CM | POA: Diagnosis not present

## 2014-06-10 ENCOUNTER — Ambulatory Visit
Admit: 2014-06-10 | Disposition: A | Discharge status: Home / Self Care | Payer: Self-pay | Attending: Oncology | Admitting: Oncology

## 2014-06-10 DIAGNOSIS — R262 Difficulty in walking, not elsewhere classified: Secondary | ICD-10-CM | POA: Diagnosis not present

## 2014-06-10 DIAGNOSIS — G939 Disorder of brain, unspecified: Secondary | ICD-10-CM | POA: Diagnosis not present

## 2014-06-10 DIAGNOSIS — Z8546 Personal history of malignant neoplasm of prostate: Secondary | ICD-10-CM | POA: Diagnosis not present

## 2014-06-10 DIAGNOSIS — R4781 Slurred speech: Secondary | ICD-10-CM | POA: Diagnosis not present

## 2014-06-11 DIAGNOSIS — E780 Pure hypercholesterolemia: Secondary | ICD-10-CM | POA: Diagnosis not present

## 2014-06-11 DIAGNOSIS — R296 Repeated falls: Secondary | ICD-10-CM | POA: Diagnosis not present

## 2014-06-11 DIAGNOSIS — I10 Essential (primary) hypertension: Secondary | ICD-10-CM | POA: Diagnosis not present

## 2014-06-11 DIAGNOSIS — G35 Multiple sclerosis: Secondary | ICD-10-CM | POA: Diagnosis not present

## 2014-06-11 DIAGNOSIS — Z8546 Personal history of malignant neoplasm of prostate: Secondary | ICD-10-CM | POA: Diagnosis not present

## 2014-06-11 DIAGNOSIS — G939 Disorder of brain, unspecified: Secondary | ICD-10-CM | POA: Diagnosis not present

## 2014-06-13 DIAGNOSIS — E780 Pure hypercholesterolemia: Secondary | ICD-10-CM | POA: Diagnosis not present

## 2014-06-13 DIAGNOSIS — R296 Repeated falls: Secondary | ICD-10-CM | POA: Diagnosis not present

## 2014-06-13 DIAGNOSIS — G939 Disorder of brain, unspecified: Secondary | ICD-10-CM | POA: Diagnosis not present

## 2014-06-13 DIAGNOSIS — G35 Multiple sclerosis: Secondary | ICD-10-CM | POA: Diagnosis not present

## 2014-06-13 DIAGNOSIS — I10 Essential (primary) hypertension: Secondary | ICD-10-CM | POA: Diagnosis not present

## 2014-06-13 DIAGNOSIS — Z8546 Personal history of malignant neoplasm of prostate: Secondary | ICD-10-CM | POA: Diagnosis not present

## 2014-06-14 DIAGNOSIS — E780 Pure hypercholesterolemia: Secondary | ICD-10-CM | POA: Diagnosis not present

## 2014-06-14 DIAGNOSIS — G939 Disorder of brain, unspecified: Secondary | ICD-10-CM | POA: Diagnosis not present

## 2014-06-14 DIAGNOSIS — Z8546 Personal history of malignant neoplasm of prostate: Secondary | ICD-10-CM | POA: Diagnosis not present

## 2014-06-14 DIAGNOSIS — G35 Multiple sclerosis: Secondary | ICD-10-CM | POA: Diagnosis not present

## 2014-06-14 DIAGNOSIS — R296 Repeated falls: Secondary | ICD-10-CM | POA: Diagnosis not present

## 2014-06-14 DIAGNOSIS — I10 Essential (primary) hypertension: Secondary | ICD-10-CM | POA: Diagnosis not present

## 2014-06-15 DIAGNOSIS — G939 Disorder of brain, unspecified: Secondary | ICD-10-CM | POA: Diagnosis not present

## 2014-06-15 DIAGNOSIS — I10 Essential (primary) hypertension: Secondary | ICD-10-CM | POA: Diagnosis not present

## 2014-06-15 DIAGNOSIS — E780 Pure hypercholesterolemia: Secondary | ICD-10-CM | POA: Diagnosis not present

## 2014-06-15 DIAGNOSIS — R296 Repeated falls: Secondary | ICD-10-CM | POA: Diagnosis not present

## 2014-06-15 DIAGNOSIS — G35 Multiple sclerosis: Secondary | ICD-10-CM | POA: Diagnosis not present

## 2014-06-15 DIAGNOSIS — Z8546 Personal history of malignant neoplasm of prostate: Secondary | ICD-10-CM | POA: Diagnosis not present

## 2014-06-21 DIAGNOSIS — I10 Essential (primary) hypertension: Secondary | ICD-10-CM | POA: Diagnosis not present

## 2014-06-21 DIAGNOSIS — G35 Multiple sclerosis: Secondary | ICD-10-CM | POA: Diagnosis not present

## 2014-06-21 DIAGNOSIS — G939 Disorder of brain, unspecified: Secondary | ICD-10-CM | POA: Diagnosis not present

## 2014-06-21 DIAGNOSIS — R296 Repeated falls: Secondary | ICD-10-CM | POA: Diagnosis not present

## 2014-06-21 DIAGNOSIS — E780 Pure hypercholesterolemia: Secondary | ICD-10-CM | POA: Diagnosis not present

## 2014-06-21 DIAGNOSIS — Z8546 Personal history of malignant neoplasm of prostate: Secondary | ICD-10-CM | POA: Diagnosis not present

## 2014-06-23 DIAGNOSIS — G35 Multiple sclerosis: Secondary | ICD-10-CM | POA: Diagnosis not present

## 2014-06-23 DIAGNOSIS — Z8546 Personal history of malignant neoplasm of prostate: Secondary | ICD-10-CM | POA: Diagnosis not present

## 2014-06-23 DIAGNOSIS — E780 Pure hypercholesterolemia: Secondary | ICD-10-CM | POA: Diagnosis not present

## 2014-06-23 DIAGNOSIS — G939 Disorder of brain, unspecified: Secondary | ICD-10-CM | POA: Diagnosis not present

## 2014-06-23 DIAGNOSIS — R296 Repeated falls: Secondary | ICD-10-CM | POA: Diagnosis not present

## 2014-06-23 DIAGNOSIS — I10 Essential (primary) hypertension: Secondary | ICD-10-CM | POA: Diagnosis not present

## 2014-06-24 DIAGNOSIS — R296 Repeated falls: Secondary | ICD-10-CM | POA: Diagnosis not present

## 2014-06-24 DIAGNOSIS — E780 Pure hypercholesterolemia: Secondary | ICD-10-CM | POA: Diagnosis not present

## 2014-06-24 DIAGNOSIS — Z8546 Personal history of malignant neoplasm of prostate: Secondary | ICD-10-CM | POA: Diagnosis not present

## 2014-06-24 DIAGNOSIS — G35 Multiple sclerosis: Secondary | ICD-10-CM | POA: Diagnosis not present

## 2014-06-24 DIAGNOSIS — I10 Essential (primary) hypertension: Secondary | ICD-10-CM | POA: Diagnosis not present

## 2014-06-24 DIAGNOSIS — G939 Disorder of brain, unspecified: Secondary | ICD-10-CM | POA: Diagnosis not present

## 2014-06-25 ENCOUNTER — Ambulatory Visit
Admit: 2014-06-25 | Disposition: A | Discharge status: Home / Self Care | Payer: Self-pay | Attending: Oncology | Admitting: Oncology

## 2014-06-25 DIAGNOSIS — Z8546 Personal history of malignant neoplasm of prostate: Secondary | ICD-10-CM | POA: Diagnosis not present

## 2014-06-25 DIAGNOSIS — D519 Vitamin B12 deficiency anemia, unspecified: Secondary | ICD-10-CM | POA: Diagnosis not present

## 2014-06-25 DIAGNOSIS — R296 Repeated falls: Secondary | ICD-10-CM | POA: Diagnosis not present

## 2014-06-25 DIAGNOSIS — I10 Essential (primary) hypertension: Secondary | ICD-10-CM | POA: Diagnosis not present

## 2014-06-25 DIAGNOSIS — G35 Multiple sclerosis: Secondary | ICD-10-CM | POA: Diagnosis not present

## 2014-06-25 DIAGNOSIS — Z87891 Personal history of nicotine dependence: Secondary | ICD-10-CM | POA: Diagnosis not present

## 2014-06-25 DIAGNOSIS — E780 Pure hypercholesterolemia: Secondary | ICD-10-CM | POA: Diagnosis not present

## 2014-06-25 DIAGNOSIS — G939 Disorder of brain, unspecified: Secondary | ICD-10-CM | POA: Diagnosis not present

## 2014-06-28 DIAGNOSIS — G35 Multiple sclerosis: Secondary | ICD-10-CM | POA: Diagnosis not present

## 2014-06-28 DIAGNOSIS — I10 Essential (primary) hypertension: Secondary | ICD-10-CM | POA: Diagnosis not present

## 2014-06-28 DIAGNOSIS — E780 Pure hypercholesterolemia: Secondary | ICD-10-CM | POA: Diagnosis not present

## 2014-06-28 DIAGNOSIS — G939 Disorder of brain, unspecified: Secondary | ICD-10-CM | POA: Diagnosis not present

## 2014-06-28 DIAGNOSIS — R296 Repeated falls: Secondary | ICD-10-CM | POA: Diagnosis not present

## 2014-06-28 DIAGNOSIS — Z8546 Personal history of malignant neoplasm of prostate: Secondary | ICD-10-CM | POA: Diagnosis not present

## 2014-06-29 DIAGNOSIS — I10 Essential (primary) hypertension: Secondary | ICD-10-CM | POA: Diagnosis not present

## 2014-06-29 DIAGNOSIS — G35 Multiple sclerosis: Secondary | ICD-10-CM | POA: Diagnosis not present

## 2014-06-29 DIAGNOSIS — Z8546 Personal history of malignant neoplasm of prostate: Secondary | ICD-10-CM | POA: Diagnosis not present

## 2014-06-29 DIAGNOSIS — E780 Pure hypercholesterolemia: Secondary | ICD-10-CM | POA: Diagnosis not present

## 2014-06-29 DIAGNOSIS — R296 Repeated falls: Secondary | ICD-10-CM | POA: Diagnosis not present

## 2014-06-29 DIAGNOSIS — G939 Disorder of brain, unspecified: Secondary | ICD-10-CM | POA: Diagnosis not present

## 2014-06-30 DIAGNOSIS — G35 Multiple sclerosis: Secondary | ICD-10-CM | POA: Diagnosis not present

## 2014-06-30 DIAGNOSIS — G939 Disorder of brain, unspecified: Secondary | ICD-10-CM | POA: Diagnosis not present

## 2014-06-30 DIAGNOSIS — Z8546 Personal history of malignant neoplasm of prostate: Secondary | ICD-10-CM | POA: Diagnosis not present

## 2014-06-30 DIAGNOSIS — E780 Pure hypercholesterolemia: Secondary | ICD-10-CM | POA: Diagnosis not present

## 2014-06-30 DIAGNOSIS — I10 Essential (primary) hypertension: Secondary | ICD-10-CM | POA: Diagnosis not present

## 2014-06-30 DIAGNOSIS — R296 Repeated falls: Secondary | ICD-10-CM | POA: Diagnosis not present

## 2014-07-02 DIAGNOSIS — R296 Repeated falls: Secondary | ICD-10-CM | POA: Diagnosis not present

## 2014-07-02 DIAGNOSIS — G939 Disorder of brain, unspecified: Secondary | ICD-10-CM | POA: Diagnosis not present

## 2014-07-02 DIAGNOSIS — E780 Pure hypercholesterolemia: Secondary | ICD-10-CM | POA: Diagnosis not present

## 2014-07-02 DIAGNOSIS — G35 Multiple sclerosis: Secondary | ICD-10-CM | POA: Diagnosis not present

## 2014-07-02 DIAGNOSIS — I10 Essential (primary) hypertension: Secondary | ICD-10-CM | POA: Diagnosis not present

## 2014-07-02 DIAGNOSIS — Z8546 Personal history of malignant neoplasm of prostate: Secondary | ICD-10-CM | POA: Diagnosis not present

## 2014-07-05 ENCOUNTER — Ambulatory Visit: Payer: Medicare Other | Admitting: Internal Medicine

## 2014-07-05 DIAGNOSIS — G35 Multiple sclerosis: Secondary | ICD-10-CM | POA: Diagnosis not present

## 2014-07-05 DIAGNOSIS — Z8546 Personal history of malignant neoplasm of prostate: Secondary | ICD-10-CM | POA: Diagnosis not present

## 2014-07-05 DIAGNOSIS — E780 Pure hypercholesterolemia: Secondary | ICD-10-CM | POA: Diagnosis not present

## 2014-07-05 DIAGNOSIS — G939 Disorder of brain, unspecified: Secondary | ICD-10-CM | POA: Diagnosis not present

## 2014-07-05 DIAGNOSIS — I10 Essential (primary) hypertension: Secondary | ICD-10-CM | POA: Diagnosis not present

## 2014-07-05 DIAGNOSIS — R296 Repeated falls: Secondary | ICD-10-CM | POA: Diagnosis not present

## 2014-07-06 DIAGNOSIS — Z8546 Personal history of malignant neoplasm of prostate: Secondary | ICD-10-CM | POA: Diagnosis not present

## 2014-07-06 DIAGNOSIS — R296 Repeated falls: Secondary | ICD-10-CM | POA: Diagnosis not present

## 2014-07-06 DIAGNOSIS — E780 Pure hypercholesterolemia: Secondary | ICD-10-CM | POA: Diagnosis not present

## 2014-07-06 DIAGNOSIS — I10 Essential (primary) hypertension: Secondary | ICD-10-CM | POA: Diagnosis not present

## 2014-07-06 DIAGNOSIS — G939 Disorder of brain, unspecified: Secondary | ICD-10-CM | POA: Diagnosis not present

## 2014-07-06 DIAGNOSIS — G35 Multiple sclerosis: Secondary | ICD-10-CM | POA: Diagnosis not present

## 2014-07-07 DIAGNOSIS — R296 Repeated falls: Secondary | ICD-10-CM | POA: Diagnosis not present

## 2014-07-07 DIAGNOSIS — G35 Multiple sclerosis: Secondary | ICD-10-CM | POA: Diagnosis not present

## 2014-07-07 DIAGNOSIS — Z8546 Personal history of malignant neoplasm of prostate: Secondary | ICD-10-CM | POA: Diagnosis not present

## 2014-07-07 DIAGNOSIS — I10 Essential (primary) hypertension: Secondary | ICD-10-CM | POA: Diagnosis not present

## 2014-07-07 DIAGNOSIS — E780 Pure hypercholesterolemia: Secondary | ICD-10-CM | POA: Diagnosis not present

## 2014-07-07 DIAGNOSIS — G939 Disorder of brain, unspecified: Secondary | ICD-10-CM | POA: Diagnosis not present

## 2014-07-09 DIAGNOSIS — G35 Multiple sclerosis: Secondary | ICD-10-CM | POA: Diagnosis not present

## 2014-07-09 DIAGNOSIS — E780 Pure hypercholesterolemia: Secondary | ICD-10-CM | POA: Diagnosis not present

## 2014-07-09 DIAGNOSIS — Z8546 Personal history of malignant neoplasm of prostate: Secondary | ICD-10-CM | POA: Diagnosis not present

## 2014-07-09 DIAGNOSIS — G939 Disorder of brain, unspecified: Secondary | ICD-10-CM | POA: Diagnosis not present

## 2014-07-09 DIAGNOSIS — I10 Essential (primary) hypertension: Secondary | ICD-10-CM | POA: Diagnosis not present

## 2014-07-09 DIAGNOSIS — R296 Repeated falls: Secondary | ICD-10-CM | POA: Diagnosis not present

## 2014-07-12 DIAGNOSIS — G939 Disorder of brain, unspecified: Secondary | ICD-10-CM | POA: Diagnosis not present

## 2014-07-12 DIAGNOSIS — Z8546 Personal history of malignant neoplasm of prostate: Secondary | ICD-10-CM | POA: Diagnosis not present

## 2014-07-12 DIAGNOSIS — G35 Multiple sclerosis: Secondary | ICD-10-CM | POA: Diagnosis not present

## 2014-07-12 DIAGNOSIS — I10 Essential (primary) hypertension: Secondary | ICD-10-CM | POA: Diagnosis not present

## 2014-07-12 DIAGNOSIS — R296 Repeated falls: Secondary | ICD-10-CM | POA: Diagnosis not present

## 2014-07-12 DIAGNOSIS — E780 Pure hypercholesterolemia: Secondary | ICD-10-CM | POA: Diagnosis not present

## 2014-07-17 NOTE — Consult Note (Signed)
Reason for Visit: This 64 year old Male patient presents to the clinic for initial evaluation of  possible brain metastases .   Referred by Dr. [name].  Diagnosis:  Chief Complaint/Diagnosis   64 year old male with known history of prostate cancer status post I-125 interstitial implant patient has multiple sclerosis progressing as well as an 8 mm enhancing lesion in the posterior parietal lobe of the brain with surrounding edema.  Imaging Report MRI scan and bone scans and CT scans reviewed   Referral Report clinical notes reviewed   Planned Treatment Regimen observation   HPI   patient is a well-known 64 year old male status post I-125 interstitial implant back in 2010 for a Gleason 6 adenocarcinoma. His PSAs in the 4.2 range. He has no multiple sclerosis. His performance status has been declining. He has had recent falls recent bone scan shows aleft hip increased uptake as well as right lateral ribs. He also had an MRI scan of his brain showing a lobulated posterior left hemisphere enhancing mass which might have originated from the dura. There is also vasogenic edema and regional mass effect. Differential diagnosis includes meningioma as well as metastasis.he has been started on a course of steroids and he is markedly improved his performance status. I been asked to evaluate him for possible palliative radiation therapy.  Past Hx:    Depression:    Hypertension:    Prostate Cancer:    Multiple Sclerosis:    Knee Surgery - Left:   Past, Family and Social History:  Past Medical History positive   Cardiovascular hypertension   Genitourinary prostate cancer with I-125 interstitial implant   Neurological/Psychiatric depression; multiple sclerosis   Family History positive   Family History Comments family history of prostate cancer lung cancer   Social History positive   Social History Comments chews tobacco occasional social EtOH use   Additional Past Medical and  Surgical History accompanied by multiple family members today   Allergies:   No Known Allergies:   Home Meds:  Home Medications: Medication Instructions Status  predniSONE 20 mg oral tablet 1 tab(s) orally once a day - to start in 5 days from today Active  dexamethasone 4 mg oral tablet 2 tab(s) orally once a day Active  amlodipine 5 mg oral tablet 1 tab(s) orally once a day Active  Ampyra 10 mg oral tablet, extended release 1 tab(s) orally every 12 hours (may take with or without food, swallow tablet whole, do not crush or chew, and store at room temperature) Active  hydrochlorothiazide 12.5 mg oral tablet 1 tab(s) orally once a day Active  Zetia 10 mg oral tablet 1 tab(s) orally once a day Active  tizanidine 4 mg oral tablet 1 tab(s) orally once a day (at bedtime) Active  mirtazapine 15 mg oral tablet  orally 1/2 tablet @ bedtime Active  lidoderm patch 5%   PRN Active   Review of Systems:  General negative   Performance Status (ECOG) 1   Skin negative   Breast negative   Ophthalmologic negative   ENMT negative   Respiratory and Thorax negative   Cardiovascular negative   Gastrointestinal negative   Genitourinary see HPI   Musculoskeletal see HPI   Neurological see HPI   Psychiatric negative   Hematology/Lymphatics negative   Endocrine negative   Allergic/Immunologic negative   Nursing Notes:  Nursing Vital Signs and Chemo Nursing Nursing Notes: *CC Vital Signs Flowsheet:   09-Dec-15 14:35  Temp Temperature 95.9  Pulse Pulse 66  Respirations Respirations  18  SBP SBP 139  DBP DBP 68  Pain Scale (0-10)  0  Current Weight (kg) (kg) 66.5   Physical Exam:  General/Skin/HEENT:  Additional PE well-developed wheelchair-bound male with obvious sequela of multiple sclerosis. No pain is elicited on range of motion of his bilateral hips. No pain is elicited on manipulation of his right and left rib cage. Crude visual fields within normal range cranial nerves II  through XII are grossly intact motor sensory and DTR levels appear equal and symmetric in upper and lower extremities. Proprioception is intact   Breasts/Resp/CV/GI/GU:  Respiratory and Thorax normal   Cardiovascular normal   Gastrointestinal normal   Genitourinary normal   MS/Neuro/Psych/Lymph:  Musculoskeletal normal   Neurological normal   Lymphatics normal   Other Results:  Radiology Results: LabUnknown:    20-Aug-15 13:36, CT Chest and Abd With Contrast  PACS Image   MRI:    23-Nov-15 11:50, MRI Brain  With/Without Contrast  MRI Brain  With/Without Contrast   REASON FOR EXAM:    brain mass FU  COMMENTS:       PROCEDURE: MR  - MR BRAIN WO/W CONTRAST  - Feb 15 2014 11:50AM     CLINICAL DATA:  64 year old male with abnormal balance. Initial  encounter. Current history of prostate cancer, patient reports  history of multiple sclerosis.    EXAM:  MRI HEAD WITHOUT AND WITH CONTRAST    TECHNIQUE:  Multiplanar, multiecho pulse sequences of the brain and surrounding  structures were obtained without and with intravenous contrast.  CONTRAST:  13 mL MultiHance    COMPARISON:  Brain MRI 02/14/2009.    FINDINGS:  Lobulated and enhancing posterior left para midline mass seems to be  arising from the posterior interhemispheric fissure. This  encompasses 36 x 18 x 37 mm (AP by transverse by CC). There is  associated widespread left parietal and occipital lobe T2 and FLAIR  hyperintensity in a pattern compatible with vasogenic edema. No  definite CSF plane visible between the enhancing mass and edematous  brain.    Associated mass effect on the left atrium of the lateral ventricle.  No midline shift. Lobular restricted diffusion along the margins of  the mass compatible with hypercellularity.    Bone marrow signal overlying the lesion is within normal limits.  There is mild posterior dural thickening tracking inferiorly from  the level of the above lesion (series 17,  image 40). The superior  sagittal sinus remains patent. No other abnormal intracranial  enhancement.    Bone marrow signal elsewhere about calvarium is within normal  limits, however, there is a 12 mm solidly enhancing scalp soft  tissue nodule along the right parietal convexity (series 17, image  47). Bone marrow signal underlying this area also is within normal  limits.    Stable anterior frontal lobe volume loss. No ventriculomegaly. No  restricted diffusion or evidence of acute infarction. No acute  intracranial hemorrhage identified. Outside of the right hemisphere,  patchy cerebral and basal ganglia white matter T2 and FLAIR  hyperintensity have not significantly changed. Brainstem and  cerebellum within normal limits. Visible internal auditory  structures appear normal. Trace right mastoid fluid. Mild to  moderate paranasal sinus mucosal thickening. Visualized orbit soft  tissues are within normal limits. Grossly negative visualized  cervical spine.     IMPRESSION:  1. Lobulated posterior left hemisphere enhancing mass, might have  originated from the dura of the interhemispheric fissure, but is  inseparable from the adjacent  medial brain where there is vasogenic  edema and regional mass effect. Differential considerations in this  setting include meningioma and metastasis. No overlying osseous  lesion.  2. No other evidence of brain metastasis.  3. Right parietal scalp 12 mm enhancing soft tissue nodule is  nonspecific. Also no bone metastasis underlying this lesion.      Electronically Signed    By: Lars Pinks M.D.    On: 02/15/2014 13:34         Verified By: Gwenyth Bender. HALL, M.D.,  CT:    20-Aug-15 13:36, CT Chest and Abd With Contrast  CT Chest and Abd With Contrast   REASON FOR EXAM:    Shortness of breath Cough Brain mets  COMMENTS:       PROCEDURE: CT  - CT CHEST AND ABDOMEN W  - Nov 12 2013  1:36PM     CLINICAL DATA:  History of prostate cancer with seed  implants in  2011. Evaluate for metastasis.    EXAM:  CTCHEST AND ABDOMEN WITH CONTRAST    TECHNIQUE:  Multidetector CT imaging of the chest and abdomen was performed  during bolus administration of intravenous contrast.  CONTRAST:  One hundred cc Isovue 370    COMPARISON:  Chest CT 03/01/2011. No prior abdominal pelvic imaging.  Bone scan of 03/28/2011.    FINDINGS:  CT CHEST FINDINGS    Lungs/Pleura: Superior segment left lower lobe 4 mm nodule on image  30. Not definitely present on the prior exam. Clear right lung. No  pleural fluid.    Heart/Mediastinum: No supraclavicular adenopathy. A probable  sebaceous cyst about the anterior right chest wall measures 1.2 cm  on image 23. This is enlarged since the prior exam. Pre sternal  presumed sebaceous cyst is similar at less than a cm.    Tortuous thoracic aorta with atherosclerosis within. Normal heart  size, without pericardial effusion. No central pulmonary embolism,  on this non-dedicated study. No mediastinal or hilar adenopathy.  Tiny hiatal hernia.    CT ABDOMEN FINDINGS    Abdomen: Focal steatosis adjacent the falciform ligament. Normal  spleen, distal stomach, pancreas. Partially contracted gallbladder,  without acute cholecystitis or biliary ductal dilatation.    Normal right adrenal gland. Mild left adrenal thickening is felt to  be similar.  Too small to characterize lesions in both kidneys.    Aortic and branch vessel atherosclerosis. No retroperitoneal or  retrocrural adenopathy. Normal abdominal bowel loops, without  ascites.    Dystrophic calcifications about the anterior abdominal wall.    Bones/Musculoskeletal: Remote posterior medial left eleventh rib  trauma. This is new since the prior CT. There is also more subtle  posterior lateral lower left rib remote trauma.    A right eleventh rib sclerotic focus is unchanged on image 52. Mild  L2 compression deformity. A sclerotic focus in the right-sided  L2  vertebral body is similar back to 2012.   IMPRESSION:  CT CHEST IMPRESSION    1. No evidence of extraosseous metastasis within the chest.  2. A nonspecific 4 mm left lower lobe lung nodule. Not definitely  present on the prior exam (some images are missing from that study).  Consider followup chest CT at 6-12 months.    CT ABDOMEN IMPRESSION    1. No acute process or evidence of extraosseous metastatic disease  in the abdomen.  2. Foci of sclerosis within the L2 vertebral body and right eleventh  rib. Given stability since 2012 and lack of  activity on the 2013  bone scan, favor bone islands.  3. Remote left rib trauma, likely the etiology of the bone scan  abnormality on 03/28/2011.      Electronically Signed    By: Abigail Miyamoto M.D.    On: 11/12/2013 15:18         Verified By: Areta Haber, M.D.,  Nuclear Med:    02-Jan-13 14:32, Bone Scan Whole Body (Part 2 of 2)  Bone Scan Whole Body (Part 2 of 2)   REASON FOR EXAM:    prostate ca abd chest CT  COMMENTS:       PROCEDURE: NM  - NM BONE WB 3 HR 2 OF 2  - Mar 28 2011  2:32PM     RESULT: Following intravenous administration of 22.43 mCi technetium 24m   MDP, whole-body bone scan was performed. Thereare multiple abnormal   focal areas of increased tracer activity involving the eighth through the   twelfth left ribs. The findings are not specific but are suspicious for   metastatic disease. There is also noted increased tracer activity at the   left femoral neck, suspicious for metastatic disease. The distribution of   tracer activity throughout the skeletal system otherwise is normal.   Tracer activity is seen in both kidneys and in the urinary bladder.    IMPRESSION:   There are focal areasof increased tracer activity involving the left   lower ribs and the left femoral neck. The findings are not specific but   are suspicious for metastatic disease.    Thank you for the opportunity to contribute to the care of  your patient.           Verified By: Dionne Ano WALL, M.D., MD   Relevent Results:   Relevant Scans and Labs MRI scan bone scan and CT scans reviewed   Assessment and Plan: Impression:   enhancing lesion of the posterior brain of unknown etiology in patient with known history of prostate cancer apparently in a good biochemical control with abnormal bone scan. Plan:   I believe the abnormal bone scan could be really related to his history of falls since the unilateral right rib lesions. There is also a lesion in the left hip greater greater trochanter region may need plain imaging. The lesion in his brain at this time I would plan on observing with a repeat MRI scan in 3-4 months since I'm not convinced based on his history of prostate cancer that this is brain metastasis. This certainly may be a meningioma. He is responded extremely well to steroid therapy and will remain on that under medical oncology's direction for the time being. I will reevaluate him in 3-4 months along with medical oncology should MRI scan show progression of disease. I believe at that tim may need to institute palliative radiation. Family and patient are all in agreement and her treatment plan. I discussed the case personally with medical oncology.  I would like to take this opportunity for allowing me to participate in the care of your patient..  Fax to Physician:  Phys[facility] To Recieve Fax: Einar Pheasant, MD - [phone].  Electronic Signatures: Denice Cardon, Roda Shutters (MD)  (Signed 09-Dec-15 15:31)  Authored: HPI, Diagnosis, Past Hx, PFSH, Allergies, Home Meds, ROS, Nursing Notes, Physical Exam, Other Results, Relevent Results, Encounter Assessment and Plan, Fax to Physician   Last Updated: 09-Dec-15 15:31 by Armstead Peaks (MD)

## 2014-07-21 ENCOUNTER — Other Ambulatory Visit: Payer: Self-pay | Admitting: Internal Medicine

## 2014-07-21 NOTE — Telephone Encounter (Signed)
Spoke to pt, states previously Rx'd by Dr. Shelia Media. Takes every night for sleep. Refill to mail order?

## 2014-07-21 NOTE — Telephone Encounter (Signed)
Sorry, I accidentally hit to refill.  If he is still seeing Dr Shelia Media (his neurologist) - he needs to get refilled through her office.  Thanks

## 2014-07-21 NOTE — Telephone Encounter (Signed)
Pt notified he would need to get Rx from Dr. Shelia Media. Called [facility] to cancel refill.

## 2014-07-22 ENCOUNTER — Other Ambulatory Visit: Payer: Self-pay | Admitting: *Deleted

## 2014-07-22 MED ORDER — AMLODIPINE BESYLATE 5 MG PO TABS: 5.0000 mg | ORAL_TABLET | Freq: Every day | ORAL | Status: DC

## 2014-09-15 ENCOUNTER — Other Ambulatory Visit: Payer: Medicare Other

## 2014-09-15 ENCOUNTER — Ambulatory Visit: Payer: Medicare Other | Admitting: Oncology

## 2014-09-20 ENCOUNTER — Ambulatory Visit (INDEPENDENT_AMBULATORY_CARE_PROVIDER_SITE_OTHER): Payer: Medicare Other | Admitting: Internal Medicine

## 2014-09-20 ENCOUNTER — Encounter: Payer: Self-pay | Admitting: Internal Medicine

## 2014-09-20 VITALS — BP 130/80 | HR 73 | Temp 97.7°F

## 2014-09-20 DIAGNOSIS — G35 Multiple sclerosis: Secondary | ICD-10-CM

## 2014-09-20 DIAGNOSIS — C61 Malignant neoplasm of prostate: Secondary | ICD-10-CM

## 2014-09-20 DIAGNOSIS — E538 Deficiency of other specified B group vitamins: Secondary | ICD-10-CM | POA: Diagnosis not present

## 2014-09-20 DIAGNOSIS — G939 Disorder of brain, unspecified: Secondary | ICD-10-CM

## 2014-09-20 DIAGNOSIS — E7800 Pure hypercholesterolemia, unspecified: Secondary | ICD-10-CM

## 2014-09-20 DIAGNOSIS — I10 Essential (primary) hypertension: Secondary | ICD-10-CM

## 2014-09-20 NOTE — Progress Notes (Signed)
Pre visit review using our clinic review tool, if applicable. No additional management support is needed unless otherwise documented below in the visit note. 

## 2014-09-20 NOTE — Progress Notes (Signed)
Patient ID: Philip Gordon, male   DOB: 08-17-1950, 64 y.o.   MRN: [id number]   Subjective:    Patient ID: Philip Gordon, male    DOB: 09-30-1950, 64 y.o.   MRN: [id number]  HPI  Patient here for a scheduled follow up.  Being followed by Dr [name] for prostate cancer and for brain mass.  On last mri - improved.  Continues follow up with oncology.  He is accompanied by his uncle.  History obtained from both of them.  Has been followed by Dr [name] for prostate cancer.  They wish to have his psa followed here.  States just having this followed now.  Also planning to follow up with Dr [name] for his prostate cancer.  Still having some shoulder pain.  Living in his apartment.  Has an aid.  Uncle makes sure he is eating better.  Strength is better.  On prednisone.  Currently sees neurology in [place].  Discussed being followed locally.  Prefers to change to local neurologist - for convenience.  Needs to restart B12 injections.  No nausea or vomiting.  Bowels stable.  Breathing stable.    Past Medical History  Diagnosis Date  . Hypertension   . Hypercholesterolemia   . Multiple sclerosis     Sees Dr Ala Bent [facility])  . Prostate cancer     Followed by Dr [name] and Dr [name]  . Depression     Current Outpatient Prescriptions on File Prior to Visit  Medication Sig Dispense Refill  . dalfampridine (AMPYRA) 10 MG TB12 Take 10 mg by mouth 2 (two) times daily.     Marland Kitchen ezetimibe (ZETIA) 10 MG tablet Take 1 tablet (10 mg total) by mouth daily. 90 tablet 1  . tiZANidine (ZANAFLEX) 4 MG tablet TAKE 1 BY MOUTH NIGHTLY 90 tablet 0  . ALPRAZolam (XANAX) 0.5 MG tablet Take 0.5 mg by mouth 3 (three) times daily as needed.      No current facility-administered medications on file prior to visit.    Review of Systems  Constitutional: Negative for appetite change (eating better.) and unexpected weight change (weight is better.  ).  HENT: Negative for congestion and sinus pressure.   Respiratory: Negative  for cough, chest tightness and shortness of breath.   Cardiovascular: Negative for chest pain, palpitations and leg swelling.  Gastrointestinal: Negative for nausea, vomiting, abdominal pain and diarrhea.  Genitourinary: Negative for dysuria and difficulty urinating.  Musculoskeletal:       Persistent right shoulder pain as outlined.    Skin: Negative for color change and rash.  Neurological: Negative for dizziness, light-headedness and headaches.  Psychiatric/Behavioral: Negative for dysphoric mood and agitation.       Objective:    Physical Exam  Constitutional: No distress.  HENT:  Nose: Nose normal.  Mouth/Throat: Oropharynx is clear and moist.  Neck: Neck supple. No thyromegaly present.  Cardiovascular: Normal rate and regular rhythm.   Pulmonary/Chest: Effort normal and breath sounds normal. No respiratory distress.  Abdominal: Soft. Bowel sounds are normal. There is no tenderness.  Musculoskeletal: He exhibits no edema or tenderness.  Lymphadenopathy:    He has no cervical adenopathy.  Skin: No rash noted. No erythema.  Psychiatric: He has a normal mood and affect. His behavior is normal.    BP 130/80 mmHg  Pulse 73  Temp(Src) 97.7 F (36.5 C) (Oral)  Ht   Wt   SpO2 98% Wt Readings from Last 3 Encounters:  01/07/14 136 lb (61.689 kg)  07/06/13 140 lb 8 oz (63.73 kg)  12/29/12 131 lb 8 oz (59.648 kg)     Lab Results  Component Value Date   WBC 11.4* 04/14/2014   HGB 14.4 04/14/2014   HCT 43.1 04/14/2014   PLT 230 04/14/2014   GLUCOSE 109* 04/14/2014   CHOL 205* 08/05/2013   TRIG 128.0 08/05/2013   HDL 45.80 08/05/2013   LDLDIRECT 129.6 01/30/2013   LDLCALC 134* 08/05/2013   ALT 24 04/14/2014   AST 12* 04/14/2014   NA 135* 04/14/2014   K 3.5 04/14/2014   CL 99 04/14/2014   CREATININE 1.29 04/14/2014   BUN 19* 04/14/2014   CO2 28 04/14/2014   TSH 1.78 01/30/2013   PSA 0.08* 01/07/2014      Assessment & Plan:   Problem List Items Addressed This  Visit    B12 deficiency    Continue B12 injections.       Brain lesion    Improved on last check.  Continue to f/u with Dr [name].        Hypercholesteremia    Off zetia.  Low cholesterol diet and exercsie.  Follow lipid panel.        Relevant Medications   amLODipine (NORVASC) 5 MG tablet   hydrochlorothiazide (MICROZIDE) 12.5 MG capsule   Hypertension - Primary    Blood pressure doing well on current regimen.  Follow pressures.  Will get labs through oncology.  rx for labs given to pt.        Relevant Medications   amLODipine (NORVASC) 5 MG tablet   hydrochlorothiazide (MICROZIDE) 12.5 MG capsule   Multiple sclerosis    Has been seeing Dr Ala Bent.  Desires local neurologist for convenience.  Schedule appt with Dr Manuella Ghazi.  He desires local neurology to take over following him for his MS and his current medications and treatment regimen.        Prostate cancer    Will check psa with next lab at Dr [name].        Relevant Medications   LYRICA 25 MG capsule     I spent 25 minutes with the patient and more than 50% of the time was spent in consultation regarding the above.     Philip Pheasant, MD

## 2014-09-21 ENCOUNTER — Encounter: Payer: Self-pay | Admitting: Internal Medicine

## 2014-09-21 MED ORDER — AMLODIPINE BESYLATE 5 MG PO TABS: 5.0000 mg | ORAL_TABLET | Freq: Every day | ORAL | Status: DC

## 2014-09-21 MED ORDER — CYANOCOBALAMIN 1000 MCG/ML IJ SOLN: 1000.0000 ug | Freq: Once | INTRAMUSCULAR | Status: DC

## 2014-09-21 MED ORDER — FINASTERIDE 5 MG PO TABS: 5.0000 mg | ORAL_TABLET | Freq: Every day | ORAL | Status: DC

## 2014-09-21 MED ORDER — HYDROCHLOROTHIAZIDE 12.5 MG PO CAPS: ORAL_CAPSULE | ORAL | Status: DC

## 2014-09-21 NOTE — Assessment & Plan Note (Signed)
Off zetia.  Low cholesterol diet and exercsie.  Follow lipid panel.

## 2014-09-21 NOTE — Assessment & Plan Note (Signed)
Improved on last check.  Continue to f/u with Dr [name].

## 2014-09-21 NOTE — Assessment & Plan Note (Signed)
Will check psa with next lab at Dr [name].

## 2014-09-21 NOTE — Assessment & Plan Note (Signed)
Continue B12 injections.   

## 2014-09-21 NOTE — Assessment & Plan Note (Signed)
Blood pressure doing well on current regimen.  Follow pressures.  Will get labs through oncology.  rx for labs given to pt.

## 2014-09-21 NOTE — Assessment & Plan Note (Signed)
Has been seeing Dr Ala Bent.  Desires local neurologist for convenience.  Schedule appt with Dr Manuella Ghazi.  He desires local neurology to take over following him for his MS and his current medications and treatment regimen.

## 2014-10-08 ENCOUNTER — Other Ambulatory Visit: Payer: Self-pay | Admitting: *Deleted

## 2014-10-08 DIAGNOSIS — G939 Disorder of brain, unspecified: Secondary | ICD-10-CM

## 2014-10-12 ENCOUNTER — Inpatient Hospital Stay: Payer: Medicare Other

## 2014-10-12 ENCOUNTER — Inpatient Hospital Stay: Payer: Medicare Other | Attending: Oncology | Admitting: Oncology

## 2014-10-12 ENCOUNTER — Encounter: Payer: Self-pay | Admitting: Oncology

## 2014-10-12 VITALS — BP 135/70 | HR 77 | Temp 95.7°F

## 2014-10-12 DIAGNOSIS — G35 Multiple sclerosis: Secondary | ICD-10-CM

## 2014-10-12 DIAGNOSIS — I10 Essential (primary) hypertension: Secondary | ICD-10-CM

## 2014-10-12 DIAGNOSIS — G939 Disorder of brain, unspecified: Secondary | ICD-10-CM

## 2014-10-12 DIAGNOSIS — E780 Pure hypercholesterolemia: Secondary | ICD-10-CM | POA: Diagnosis not present

## 2014-10-12 DIAGNOSIS — E876 Hypokalemia: Secondary | ICD-10-CM

## 2014-10-12 DIAGNOSIS — C61 Malignant neoplasm of prostate: Secondary | ICD-10-CM

## 2014-10-12 DIAGNOSIS — Z1329 Encounter for screening for other suspected endocrine disorder: Secondary | ICD-10-CM

## 2014-10-12 DIAGNOSIS — Z8546 Personal history of malignant neoplasm of prostate: Secondary | ICD-10-CM | POA: Insufficient documentation

## 2014-10-12 DIAGNOSIS — Z79899 Other long term (current) drug therapy: Secondary | ICD-10-CM | POA: Insufficient documentation

## 2014-10-12 LAB — COMPREHENSIVE METABOLIC PANEL
ALT: 12 U/L — ABNORMAL LOW (ref 17–63)
AST: 20 U/L (ref 15–41)
Albumin: 4.1 g/dL (ref 3.5–5.0)
Alkaline Phosphatase: 65 U/L (ref 38–126)
Anion gap: 9 (ref 5–15)
BUN: 22 mg/dL — ABNORMAL HIGH (ref 6–20)
CALCIUM: 8.8 mg/dL — AB (ref 8.9–10.3)
CHLORIDE: 103 mmol/L (ref 101–111)
CO2: 26 mmol/L (ref 22–32)
CREATININE: 1.27 mg/dL — AB (ref 0.61–1.24)
GFR calc Af Amer: >60 mL/min (ref >60)
GFR calc non Af Amer: 57 mL/min — ABNORMAL LOW (ref >60)
GLUCOSE: 81 mg/dL (ref 65–99)
Potassium: 3 mmol/L — ABNORMAL LOW (ref 3.5–5.1)
Sodium: 138 mmol/L (ref 135–145)
Total Bilirubin: 1.1 mg/dL (ref 0.3–1.2)
Total Protein: 6.5 g/dL (ref 6.5–8.1)

## 2014-10-12 LAB — CBC WITH DIFFERENTIAL/PLATELET
Basophils Absolute: 0 10*3/uL (ref 0–0.1)
Basophils Relative: 1 %
EOS PCT: 1 %
Eosinophils Absolute: 0 10*3/uL (ref 0–0.7)
HCT: 43.3 % (ref 40.0–52.0)
HEMOGLOBIN: 14.5 g/dL (ref 13.0–18.0)
Lymphocytes Relative: 21 %
Lymphs Abs: 1.6 10*3/uL (ref 1.0–3.6)
MCH: 30.3 pg (ref 26.0–34.0)
MCHC: 33.5 g/dL (ref 32.0–36.0)
MCV: 90.5 fL (ref 80.0–100.0)
Monocytes Absolute: 1 10*3/uL (ref 0.2–1.0)
Monocytes Relative: 14 %
NEUTROS PCT: 63 %
Neutro Abs: 4.8 10*3/uL (ref 1.4–6.5)
Platelets: 243 10*3/uL (ref 150–440)
RBC: 4.78 MIL/uL (ref 4.40–5.90)
RDW: 13.6 % (ref 11.5–14.5)
WBC: 7.5 10*3/uL (ref 3.8–10.6)

## 2014-10-12 LAB — TSH: TSH: 1.071 u[IU]/mL (ref 0.350–4.500)

## 2014-10-12 LAB — PSA: PSA: 0.05 ng/mL (ref 0.00–4.00)

## 2014-10-12 MED ORDER — POTASSIUM CHLORIDE ER 10 MEQ PO TBCR
10.0000 meq | EXTENDED_RELEASE_TABLET | Freq: Every day | ORAL | Status: DC
Start: 2014-10-12 — End: 2015-06-07

## 2014-10-12 MED ORDER — PREDNISONE 5 MG PO TABS: 5.0000 mg | ORAL_TABLET | ORAL | Status: DC

## 2014-10-12 MED ORDER — CYANOCOBALAMIN 1000 MCG/ML IJ SOLN
1000.0000 ug | Freq: Once | INTRAMUSCULAR | Status: AC
  Administered 2014-10-12: 1000 ug via INTRAMUSCULAR
  Filled 2014-10-12: qty 1

## 2014-10-12 NOTE — Progress Notes (Signed)
[place] @ [facility] [phone]  Fax:([phone] [place]: [birth year]  MR#: [id number]  LKT#:[id number]  Patient Care Team: Einar Pheasant, MD as PCP - General (Internal Medicine)  CHIEF COMPLAINT:  Chief Complaint  Patient presents with  . Follow-up    Oncology History   64 year old male with known history of prostate cancer status post I-125 interstitial implant patient has multiple sclerosis progressing as well as an 8 mm enhancing lesion in the posterior parietal lobe of the brain with surrounding edema. HPI:        CA of prostate   01/16/2012 Initial Diagnosis CA of prostate    Oncology Flowsheet 07/24/2012 12/29/2012 01/30/2013 07/06/2013 08/05/2013 01/07/2014 10/12/2014  cyanocobalamin ((VITAMIN B-12)) IM 1,000 mcg 1,000 mcg 1,000 mcg 1,000 mcg 1,000 mcg 1,000 mcg 1,000 mcg    INTERVAL HISTORY:  64 year old gentleman with a previous history of multiple sclerosis patient is in wheelchair.  Patient had previous history of carcinoma prostate as well as abnormal brain scan Patient was on prednisone gradually improved MRI scan which was repeated revealed abnormality almost resolved. Appetite is improved.  Patient is gaining weight.  Right now prednisone 10 mg daily REVIEW OF SYSTEMS:   Gen. status: Patient is in wheelchair. Gaining weight.   Slurred Speech due to multiple sclerosis GI no nausea no vomiting no diarrhea Musculoskeletal system no bony pains. Neurological system received dual neurological deficit due to multiple sclerosis Lower extremity no edema Skin: No rash Cardiac no chest pain Lungs shortness of breath on exertion As per HPI. Otherwise, a complete review of systems is negatve.  PAST MEDICAL HISTORY: Past Medical History  Diagnosis Date  . Hypertension   . Hypercholesterolemia   . Multiple sclerosis     Sees Dr Ala Bent [facility])  . Prostate cancer     Followed by Dr [name] and Dr [name]  . Depression     PAST  SURGICAL HISTORY: Past Surgical History  Procedure Laterality Date  . Knee surgery      FAMILY HISTORY Family History  Problem Relation Age of Onset  . Hypertension Mother   . Stroke Father   . Diabetes Maternal Grandmother   . Diabetes Maternal Grandfather   . Leukemia Sister     died - age 64    ADVANCED DIRECTIVES:  No flowsheet data found.  HEALTH MAINTENANCE: History  Substance Use Topics  . Smoking status: Never Smoker   . Smokeless tobacco: Current User    Types: Chew     Comment: Is going to try and cut down on the amount.   . Alcohol Use: No      Not on File  Current Outpatient Prescriptions  Medication Sig Dispense Refill  . amLODipine (NORVASC) 5 MG tablet Take 1 tablet (5 mg total) by mouth daily. 90 tablet 1  . ezetimibe (ZETIA) 10 MG tablet Take 1 tablet (10 mg total) by mouth daily. 90 tablet 1  . finasteride (PROSCAR) 5 MG tablet Take 1 tablet (5 mg total) by mouth daily. 90 tablet 1  . hydrochlorothiazide (MICROZIDE) 12.5 MG capsule TAKE 1 CAPSULE (12.5 MG TOTAL) BY MOUTH DAILY. 90 capsule 1  . LYRICA 25 MG capsule Take 25 mg by mouth 2 (two) times daily.  4  . mirtazapine (REMERON) 15 MG tablet Take 7.5 mg by mouth at bedtime.    Marland Kitchen tiZANidine (ZANAFLEX) 4 MG tablet TAKE 1 BY MOUTH NIGHTLY 90 tablet 0  . ALPRAZolam (XANAX) 0.5 MG tablet Take  0.5 mg by mouth 3 (three) times daily as needed.     . cyanocobalamin (,VITAMIN B-12,) 1000 MCG/ML injection Inject 1 mL (1,000 mcg total) into the muscle once. q month (Patient not taking: Reported on 10/12/2014) 10 mL 1  . dalfampridine (AMPYRA) 10 MG TB12 Take 10 mg by mouth 2 (two) times daily.     . potassium chloride (K-DUR) 10 MEQ tablet Take 1 tablet (10 mEq total) by mouth daily. 30 tablet 3  . predniSONE (DELTASONE) 5 MG tablet Take 1 tablet (5 mg total) by mouth as directed. 60 tablet 0   No current facility-administered medications for this visit.    OBJECTIVE:  Filed Vitals:   10/12/14 1231  BP:  135/70  Pulse: 77  Temp: 95.7 F (35.4 C)     There is no weight on file to calculate BMI.    ECOG FS:2 - Symptomatic, <50% confined to bed  PHYSICAL EXAM: General  status: Performance status is 2 Patient has not lost significant weight recent is in wheelchair because of multiple sclerosis HEENT: No evidence of stomatitis. Sclera and conjunctivae :: No jaundice.   pale looking. Lungs: Air  entry equal on both sides.  No rhonchi.  No rales.  Cardiac: Heart sounds are normal.  No pericardial rub.  No murmur. Lymphatic system: Cervical, axillary, inguinal, lymph nodes not palpable GI: Abdomen is soft.  No ascites.  Liver spleen not palpable.  No tenderness.  Bowel sounds are within normal limit Lower extremity: No edema Neurological system: Higher functions, neurological deficit due to multiple sclerosis Skin: No rash.  No ecchymosis.Marland Kitchen  LAB RESULTS: All lab data has been reviewed     ASSESSMENT: Cancer of prostate PSA is pending.  Presently not on any active therapy 2.  Multiple sclerosissclerosis not on any active therapy patient has a significant residual effect   MEDICAL DECISION MAKING:  Abnormal MRI scan.  Repeat MRI scan has shown that abnormality is completely resolved. Suspected lymphoma versus multiple sclerosis Taper prednisone to 5 mg daily for one month followed by 5 mg every other day for a month and then discontinue. Patient and will be followed on regular basis by primary care physician I will be available to see patient intake there is any significant problem related to malignancy. Situation has been discussed with the patient and caregiver. Total duration of visit was 35 minutes.  50% or more time was spent in counseling patient and family regarding prognosis and options of treatment and available resources.    Pat ient expressed understanding and was in agreement with this plan. He also understands that He can call clinic at any time with any questions, concerns, or  complaints.    No matching staging information was found for the patient.  Forest Gleason, MD   10/12/2014 1:26 PM

## 2014-10-12 NOTE — Progress Notes (Signed)
Patient does have living will. Never smoked. 

## 2014-10-15 ENCOUNTER — Telehealth: Payer: Self-pay | Admitting: *Deleted

## 2014-10-15 NOTE — Telephone Encounter (Signed)
Noted  

## 2014-10-15 NOTE — Telephone Encounter (Signed)
Received a fax from cancer center with PSA results of 0.05 (in epic) & TSH of 1.071.  It was also noted that they were unable to draw a fasting lipid profile because he was not fasting at time of draw.

## 2014-11-05 ENCOUNTER — Emergency Department
Admission: EM | Admit: 2014-11-05 | Discharge: 2014-11-06 | Disposition: A | Discharge status: Home / Self Care | Payer: Medicare Other | Attending: Emergency Medicine | Admitting: Emergency Medicine

## 2014-11-05 ENCOUNTER — Telehealth: Payer: Self-pay

## 2014-11-05 ENCOUNTER — Other Ambulatory Visit: Payer: Self-pay

## 2014-11-05 ENCOUNTER — Encounter: Payer: Self-pay | Admitting: Emergency Medicine

## 2014-11-05 DIAGNOSIS — G35 Multiple sclerosis: Secondary | ICD-10-CM | POA: Diagnosis not present

## 2014-11-05 DIAGNOSIS — M6281 Muscle weakness (generalized): Secondary | ICD-10-CM | POA: Diagnosis present

## 2014-11-05 DIAGNOSIS — R0981 Nasal congestion: Secondary | ICD-10-CM | POA: Diagnosis not present

## 2014-11-05 DIAGNOSIS — Z79899 Other long term (current) drug therapy: Secondary | ICD-10-CM | POA: Insufficient documentation

## 2014-11-05 DIAGNOSIS — R531 Weakness: Secondary | ICD-10-CM

## 2014-11-05 DIAGNOSIS — I10 Essential (primary) hypertension: Secondary | ICD-10-CM | POA: Insufficient documentation

## 2014-11-05 DIAGNOSIS — M7989 Other specified soft tissue disorders: Secondary | ICD-10-CM | POA: Diagnosis not present

## 2014-11-05 LAB — COMPREHENSIVE METABOLIC PANEL
ALBUMIN: 3.8 g/dL (ref 3.5–5.0)
ALT: 12 U/L — ABNORMAL LOW (ref 17–63)
ANION GAP: 10 (ref 5–15)
AST: 21 U/L (ref 15–41)
Alkaline Phosphatase: 79 U/L (ref 38–126)
BILIRUBIN TOTAL: 1.1 mg/dL (ref 0.3–1.2)
BUN: 20 mg/dL (ref 6–20)
CALCIUM: 8.8 mg/dL — AB (ref 8.9–10.3)
CHLORIDE: 101 mmol/L (ref 101–111)
CO2: 26 mmol/L (ref 22–32)
CREATININE: 1.4 mg/dL — AB (ref 0.61–1.24)
GFR calc Af Amer: 59 mL/min — ABNORMAL LOW (ref >60)
GFR, EST NON AFRICAN AMERICAN: 51 mL/min — AB (ref >60)
Glucose, Bld: 99 mg/dL (ref 65–99)
Potassium: 3.5 mmol/L (ref 3.5–5.1)
SODIUM: 137 mmol/L (ref 135–145)
Total Protein: 7 g/dL (ref 6.5–8.1)

## 2014-11-05 LAB — CBC WITH DIFFERENTIAL/PLATELET
Basophils Absolute: 0.1 10*3/uL (ref 0–0.1)
Basophils Relative: 1 %
EOS ABS: 0 10*3/uL (ref 0–0.7)
Eosinophils Relative: 0 %
HCT: 41.1 % (ref 40.0–52.0)
HEMOGLOBIN: 13.9 g/dL (ref 13.0–18.0)
LYMPHS ABS: 1.1 10*3/uL (ref 1.0–3.6)
LYMPHS PCT: 9 %
MCH: 30.8 pg (ref 26.0–34.0)
MCHC: 33.9 g/dL (ref 32.0–36.0)
MCV: 91 fL (ref 80.0–100.0)
MONOS PCT: 13 %
Monocytes Absolute: 1.5 10*3/uL — ABNORMAL HIGH (ref 0.2–1.0)
NEUTROS PCT: 77 %
Neutro Abs: 9.3 10*3/uL — ABNORMAL HIGH (ref 1.4–6.5)
Platelets: 241 10*3/uL (ref 150–440)
RBC: 4.51 MIL/uL (ref 4.40–5.90)
RDW: 14.3 % (ref 11.5–14.5)
WBC: 12.1 10*3/uL — AB (ref 3.8–10.6)

## 2014-11-05 MED ORDER — SODIUM CHLORIDE 0.9 % IV BOLUS (SEPSIS)
500.0000 mL | Freq: Once | INTRAVENOUS | Status: AC
  Administered 2014-11-05: 500 mL via INTRAVENOUS

## 2014-11-05 NOTE — Telephone Encounter (Signed)
Pt was persistent that you send in meds for him all the time when something like this happens.  Will inform pt of your recommendation.   LVM for pt to call back as soon as possible here at the [facility] location.

## 2014-11-05 NOTE — ED Notes (Signed)
1L of NS (started by EMS) completed.

## 2014-11-05 NOTE — ED Provider Notes (Signed)
[facility] Emergency Department Provider Note  ____________________________________________  Time seen: 11:10 PM  I have reviewed the triage vital signs and the nursing notes.   HISTORY  Chief Complaint Extremity Weakness general weakness    HPI Philip Gordon is a 64 y.o. male with multiple sclerosis who reports that he started feeling weak earlier today and had an acute episode this evening where he felt he couldn't move his legs very well. He reports that he has had episodes like this in the past when he has dehydrated and he has felt better with IV fluids.  The patient reports she has not been drinking much water lately. He also reports he has a "sinus infection". He has taken Mucinex and a decongestant. He believes these medicines have raise his blood pressure and asked about this issue as well.  The symptoms of this "sinus infection" began 2 days ago. He reports drainage from his nose and congestion. This appears to continue in the emergency department.  He denies any fever, headache, chest pain, shortness of breath, or abdominal pain.  On exam, he has some mild edema in his legs, worse in the left leg. When asked about this, he denies awareness of any prior swelling in his legs. His daughter is present as well and she is unaware of any history of swelling.   Past Medical History  Diagnosis Date  . Hypertension   . Hypercholesterolemia   . Multiple sclerosis     Sees Dr Ala Bent [facility])  . Prostate cancer     Followed by Dr [name] and Dr [name]  . Depression     Patient Active Problem List   Diagnosis Date Noted  . Brain lesion 01/17/2014  . Obstruction of urinary tract 10/15/2012  . Disorder of bladder function 01/16/2012  . Incomplete bladder emptying 01/16/2012  . CA of prostate 01/16/2012  . DS (disseminated sclerosis) 01/16/2012  . Hypertension 12/28/2011  . Hypercholesteremia 12/28/2011  . Multiple sclerosis 12/28/2011  .  B12 deficiency 12/28/2011  . Acquired ankle/foot deformity 12/07/2010    Past Surgical History  Procedure Laterality Date  . Knee surgery      Current Outpatient Rx  Name  Route  Sig  Dispense  Refill  . amLODipine (NORVASC) 5 MG tablet   Oral   Take 1 tablet (5 mg total) by mouth daily.   90 tablet   1   . cyanocobalamin (,VITAMIN B-12,) 1000 MCG/ML injection   Intramuscular   Inject 1 mL (1,000 mcg total) into the muscle once. q month Patient taking differently: Inject 1,000 mcg into the muscle every 30 (thirty) days.    10 mL   1   . dalfampridine (AMPYRA) 10 MG TB12   Oral   Take 10 mg by mouth 2 (two) times daily.          . finasteride (PROSCAR) 5 MG tablet   Oral   Take 1 tablet (5 mg total) by mouth daily.   90 tablet   1   . hydrochlorothiazide (MICROZIDE) 12.5 MG capsule      TAKE 1 CAPSULE (12.5 MG TOTAL) BY MOUTH DAILY.   90 capsule   1   . mirtazapine (REMERON) 15 MG tablet   Oral   Take 15 mg by mouth at bedtime.         . predniSONE (DELTASONE) 10 MG tablet   Oral   Take 10 mg by mouth daily.         Marland Kitchen  pregabalin (LYRICA) 25 MG capsule   Oral   Take 25 mg by mouth 2 (two) times daily.         Marland Kitchen tiZANidine (ZANAFLEX) 4 MG tablet   Oral   Take 4 mg by mouth at bedtime.         . potassium chloride (K-DUR) 10 MEQ tablet   Oral   Take 1 tablet (10 mEq total) by mouth daily. Patient not taking: Reported on 11/05/2014   30 tablet   3   . predniSONE (DELTASONE) 20 MG tablet   Oral   Take 1 tablet (20 mg total) by mouth daily.   5 tablet   0   . predniSONE (DELTASONE) 5 MG tablet   Oral   Take 1 tablet (5 mg total) by mouth as directed. Patient not taking: Reported on 11/05/2014   60 tablet   0     Allergies Review of patient's allergies indicates no known allergies.  Family History  Problem Relation Age of Onset  . Hypertension Mother   . Stroke Father   . Diabetes Maternal Grandmother   . Diabetes Maternal  Grandfather   . Leukemia Sister     died - age 66    Social History Social History  Substance Use Topics  . Smoking status: Never Smoker   . Smokeless tobacco: Current User    Types: Chew     Comment: Is going to try and cut down on the amount.   . Alcohol Use: No    Review of Systems  Constitutional: Negative for fever. ENT: "sinus infection" for 2 days. See history of present illness Cardiovascular: Negative for chest pain. Respiratory: Negative for shortness of breath. Gastrointestinal: Negative for abdominal pain, vomiting and diarrhea. Genitourinary: Negative for dysuria. Musculoskeletal: weakness in his legs this evening. See history of present illness. Skin: Negative for rash. Neurological: history of multiple sclerosis. History of weakness in his legs this evening.   10-point ROS otherwise negative.  ____________________________________________   PHYSICAL EXAM:  VITAL SIGNS: ED Triage Vitals  Enc Vitals Group     BP 11/05/14 2149 166/89 mmHg     Pulse Rate 11/05/14 2149 90     Resp 11/05/14 2149 16     Temp 11/05/14 2149 98.3 F (36.8 C)     Temp Source 11/05/14 2149 Oral     SpO2 11/05/14 2149 100 %     Weight 11/05/14 2149 150 lb (68.04 kg)     Height 11/05/14 2149 5\' 9"  (1.753 m)     Head Cir --      Peak Flow --      Pain Score 11/05/14 2150 0     Pain Loc --      Pain Edu? --      Excl. in [place]? --     Constitutional: Alert and oriented. Has some congestion and nasal drainage noted. Appears slightly generally weak. ENT   Head: Normocephalic and atraumatic.   Nose:positive congestion/rhinnorhea.   Mouth/Throat: Mucous membranes are moist. Cardiovascular: Normal rate, regular rhythm, no murmur noted Respiratory:  Normal respiratory effort, no tachypnea.    Breath sounds are clear and equal bilaterally.  Gastrointestinal: Soft and nontender. No distention.  Back: No muscle spasm, no tenderness, no CVA tenderness. Musculoskeletal: No  deformity noted. Nontender with normal range of motion in all extremities. Noted 1-2+ pitting edema in both lower extremities, however the left leg is larger. Neurologic:  Normal speech and language. No gross focal neurologic deficits are  appreciated. 5 over 5 strength in all 4 extremities, although he appears to have less than full strength with little bit of a waiver in his legs as he raised some. Skin:  Skin is warm, dry. No rash noted. Psychiatric: Mood and affect are normal. Speech and behavior are normal.  ____________________________________________    LABS (pertinent positives/negatives)  Labs Reviewed  CBC WITH DIFFERENTIAL/PLATELET - Abnormal; Notable for the following:    WBC 12.1 (*)    Neutro Abs 9.3 (*)    Monocytes Absolute 1.5 (*)    All other components within normal limits  COMPREHENSIVE METABOLIC PANEL - Abnormal; Notable for the following:    Creatinine, Ser 1.40 (*)    Calcium 8.8 (*)    ALT 12 (*)    GFR calc non Af Amer 51 (*)    GFR calc Af Amer 59 (*)    All other components within normal limits  URINALYSIS COMPLETEWITH MICROSCOPIC ([facility] ONLY) - Abnormal; Notable for the following:    Color, Urine STRAW (*)    APPearance CLEAR (*)    Glucose, UA 50 (*)    Ketones, ur TRACE (*)    All other components within normal limits     ____________________________________________   EKG  ED ECG REPORT I, Latrena Benegas W, the attending physician, personally viewed and interpreted this ECG.   Date: 11/05/2014  EKG Time: 2151  Rate: 92  Rhythm: Normal sinus rhythm with left anterior fascicular block and incomplete right bundle branch block.  Axis: left axis deviation  Intervals: Normal  ST&T Change: None noted   ____________________________________________    RADIOLOGY  Ultrasound, left lower extremity. Impression: No evidence of deep vein thrombosis  ____________________________________________   INITIAL IMPRESSION / ASSESSMENT AND PLAN / ED  COURSE  Pertinent labs & imaging results that were available during my care of the patient were reviewed by me and considered in my medical decision making (see chart for details).  Evaluation of this patient feels a little bit limited. While he is alert and communicative, he is being a bit presumptuous saying that he thinks dehydration is his only problem. He then goes into the concern he has for his "sinus infection" and his use of cold medicines.  He says he is feeling a little bit better after having received IV fluids already, but I'm still a little concern for him. We will order blood tests. Due to the noted swelling of the left lower leg, we will do an ultrasound.   ----------------------------------------- 3:03 AM on 11/06/2014 -----------------------------------------  The patient's ultrasound does not show sign of deep vein thrombosis. His white count is slightly elevated. His electrolytes are overall unremarkable. Reassessment at this time finds the patient overall comfortable. I have offered admission hospital for possible multiple sclerosis flare. The patient prefers to go home. We've spoken about treating with steroids. He is on prednisone 10 mg a day currently. We will bump this to 40 mg this evening and then 20 mg a day for the next 4 days. He has a neurologist in [place]. I have asked him and his daughter to call the neurologist on Monday to arrange follow-up.  ____________________________________________   FINAL CLINICAL IMPRESSION(S) / ED DIAGNOSES  Final diagnoses:  Multiple sclerosis  Weakness      Ahmed Prima, MD 11/06/14 [phone]

## 2014-11-05 NOTE — Telephone Encounter (Signed)
Unsure what treating without him being seen.  He will need to be evaluated.  If getting a ride is a problem, acute care open until 7:00 and [place] urgent [facility] open until 8:00 - can see if someone can take him when get off work.  Need to make sure what is going on.

## 2014-11-05 NOTE — ED Notes (Signed)
Pt to rm 13 via EMS.  EMS reports pt c/o weakness/malaise x 2 days.  EMS pt was found slumped over in kitchen.  Pt hx of MS, reports this has happened before when he was dehydrated.  EMS gave NS about 250cc en route and pt reports feeling better.  Pt NAD upon arrival

## 2014-11-05 NOTE — Telephone Encounter (Signed)
Thanks.  With all of his issues, I do not recommend treatment without him being seen.

## 2014-11-05 NOTE — Telephone Encounter (Signed)
Pt called and stated that his head is hurting and his teeth hurt. Wanted to know if you could call in an abx. Offered an appt but pt stated that he did not have a way into the office.

## 2014-11-06 ENCOUNTER — Emergency Department: Payer: Medicare Other

## 2014-11-06 DIAGNOSIS — G35 Multiple sclerosis: Secondary | ICD-10-CM | POA: Diagnosis not present

## 2014-11-06 DIAGNOSIS — M7989 Other specified soft tissue disorders: Secondary | ICD-10-CM | POA: Diagnosis not present

## 2014-11-06 LAB — URINALYSIS COMPLETE WITH MICROSCOPIC (ARMC ONLY)
BACTERIA UA: NONE SEEN
Bilirubin Urine: NEGATIVE
GLUCOSE, UA: 50 mg/dL — AB
HGB URINE DIPSTICK: NEGATIVE
LEUKOCYTES UA: NEGATIVE
Nitrite: NEGATIVE
PH: 6 (ref 5.0–8.0)
Protein, ur: NEGATIVE mg/dL
SPECIFIC GRAVITY, URINE: 1.013 (ref 1.005–1.030)
Squamous Epithelial / LPF: NONE SEEN
WBC, UA: NONE SEEN WBC/hpf (ref 0–5)

## 2014-11-06 MED ORDER — PREDNISONE 20 MG PO TABS: 20.0000 mg | ORAL_TABLET | Freq: Every day | ORAL | Status: DC

## 2014-11-06 MED ORDER — PREDNISONE 20 MG PO TABS
40.0000 mg | ORAL_TABLET | Freq: Once | ORAL | Status: AC
Start: 2014-11-06 — End: 2014-11-06
  Administered 2014-11-06: 40 mg via ORAL
  Filled 2014-11-06: qty 2

## 2014-11-06 NOTE — Discharge Instructions (Signed)
Your blood tests are overall okay. An ultrasound of your left leg did not show any sign of blood clot. The weakness you have been experiencing may be a flare of your multiple sclerosis. Follow-up with your neurologist this coming week. Follow-up with your regular doctor as well. Increase your prednisone dose as discussed and prescribed. Return to the emergency department if you feel further weakness, or if you have other urgent concerns.  Multiple Sclerosis Multiple sclerosis (MS) is a disease of the central nervous system. It leads to the loss of the insulating covering of the nerves (myelin sheath) of your brain. When this happens, brain signals do not get sent properly or may not get sent at all. The age of onset of MS varies.  CAUSES The cause of MS is unknown. However, it is more common in the [place] than in the [place]. RISK FACTORS There is a higher number of women with MS than men. MS is not an illness that is passed down to you from your family members (inherited). However, your risk of MS is higher if you have a relative with MS. SIGNS AND SYMPTOMS  The symptoms of MS occur in episodes or attacks. These attacks may last weeks to months. There may be long periods of almost no symptoms between attacks. The symptoms of MS vary. This is because of the many different ways it affects the central nervous system. The main symptoms of MS include:  Vision problems and eye pain.  Numbness.  Weakness.  Inability to move your arms, hands, feet, or legs (paralysis).  Balance problems.  Tremors. DIAGNOSIS  Your health care provider can diagnose MS with the help of imaging exams and lab tests. These may include specialized X-ray exams and spinal fluid tests. The best imaging exam to confirm a diagnosis of MS is an MRI. TREATMENT  There is no known cure for MS, but there are medicines that can decrease the number and frequency of attacks. Steroids are often used for  short-term relief. Physical and occupational therapy may also help. There are also many new alternative or complementary treatments available to help control the symptoms of MS. Ask your health care provider if any of these other options are right for you. HOME CARE INSTRUCTIONS   Take medicines as directed by your health care provider.  Exercise as directed by your health care provider. SEEK MEDICAL CARE IF: You begin to feel depressed. SEEK IMMEDIATE MEDICAL CARE IF:  You develop paralysis.  You have problems with bladder, bowel, or sexual function.  You develop mental changes, such as forgetfulness or mood swings.  You have a period of uncontrolled movements (seizure). Document Released: 03/09/2000 Document Revised: 03/17/2013 Document Reviewed: 11/17/2012 [facility] Patient Information 2015 [place], [place]. This information is not intended to replace advice given to you by your health care provider. Make sure you discuss any questions you have with your health care provider.

## 2014-11-09 NOTE — Telephone Encounter (Signed)
Pt was seen in ED on 11/05/14

## 2014-11-15 ENCOUNTER — Ambulatory Visit (INDEPENDENT_AMBULATORY_CARE_PROVIDER_SITE_OTHER): Payer: Medicare Other | Admitting: Family Medicine

## 2014-11-15 ENCOUNTER — Ambulatory Visit
Admission: RE | Admit: 2014-11-15 | Discharge: 2014-11-15 | Disposition: A | Discharge status: Home / Self Care | Payer: Medicare Other | Source: Ambulatory Visit | Attending: Family Medicine | Admitting: Family Medicine

## 2014-11-15 ENCOUNTER — Encounter: Payer: Self-pay | Admitting: Family Medicine

## 2014-11-15 ENCOUNTER — Telehealth: Payer: Self-pay | Admitting: Family Medicine

## 2014-11-15 VITALS — BP 140/72 | HR 86 | Temp 97.7°F

## 2014-11-15 DIAGNOSIS — R51 Headache: Secondary | ICD-10-CM | POA: Insufficient documentation

## 2014-11-15 DIAGNOSIS — K047 Periapical abscess without sinus: Secondary | ICD-10-CM | POA: Diagnosis not present

## 2014-11-15 DIAGNOSIS — R220 Localized swelling, mass and lump, head: Secondary | ICD-10-CM

## 2014-11-15 DIAGNOSIS — S0990XA Unspecified injury of head, initial encounter: Secondary | ICD-10-CM | POA: Diagnosis not present

## 2014-11-15 DIAGNOSIS — J0110 Acute frontal sinusitis, unspecified: Secondary | ICD-10-CM | POA: Diagnosis not present

## 2014-11-15 DIAGNOSIS — R519 Headache, unspecified: Secondary | ICD-10-CM

## 2014-11-15 DIAGNOSIS — J320 Chronic maxillary sinusitis: Secondary | ICD-10-CM | POA: Diagnosis not present

## 2014-11-15 DIAGNOSIS — J329 Chronic sinusitis, unspecified: Secondary | ICD-10-CM | POA: Insufficient documentation

## 2014-11-15 DIAGNOSIS — S0993XA Unspecified injury of face, initial encounter: Secondary | ICD-10-CM | POA: Diagnosis not present

## 2014-11-15 MED ORDER — AMOXICILLIN-POT CLAVULANATE 500-125 MG PO TABS: 500.0000 mg | ORAL_TABLET | Freq: Two times a day (BID) | ORAL | Status: DC

## 2014-11-15 NOTE — Progress Notes (Addendum)
Patient ID: Philip Gordon, male   DOB: Dec 18, 1950, 64 y.o.   MRN: [id number]  Philip Rumps, MD Phone: [phone]  Philip Gordon is a 64 y.o. male who presents today for same day appointment.  Sinus pressure: patient reports 1-2 weeks for frontal and maxillary sinus congestion and pressure. He notes dull frontal HA with this. He notes a single episode of dull discomfort over his right eye yesterday. Notes rhinorrhea and sore throat with this as well. Denies fevers. No hearing changes, vision changes, photphobia, phonophobia, neck pain, numbness, or weakness. No sick contacts. Does not he fell on 11/05/14 due to leg weakness relating to his MS. Was evaluated in the ED for this. Denies hitting his head and denies injury to head or body. No pain noted in body. Not on blood thinners.  Right jaw swelling: reports onset of right mandibular swelling today. Notes no discomfort in the area. No erythema or fevers. No trouble swallowing. Notes he last saw his dentist 2 months ago. Has not had this swelling previously. States he feels well overall.  Past Medical History  Diagnosis Date  . Hypertension   . Hypercholesterolemia   . Multiple sclerosis     Sees Dr Ala Bent [facility])  . Prostate cancer     Followed by Dr [name] and Dr [name]  . Depression     ROS see HPI  Objective  Physical Exam Filed Vitals:   11/15/14 1310  BP: 140/72  Pulse: 86  Temp: 97.7 F (36.5 C)    Physical Exam  Constitutional: No distress.  HENT:  Head: Normocephalic and atraumatic.  Right Ear: External ear normal.  Left Ear: External ear normal.  Mouth/Throat: Oropharynx is clear and moist. No oropharyngeal exudate.  Right mid mandible with ~2 cm diameter area of swelling with no erythema, tenderness, or fluctuance, there is mild induration in the central portion, bony border of mandible palpable, no swelling of throat or neck, no palpable neck masses Poor dentition throughout, no tooth tenderness,  no masses palpated under tongue or in cheek, no elevation of floor of mouth, no drainage or gingival swelling or erythema No OP swelling or erythema, no swelling noted in mouth  Eyes: Conjunctivae are normal. Pupils are equal, round, and reactive to light.  Neck: Neck supple.  No submandibular swelling or LAD  Cardiovascular: Normal rate, regular rhythm and normal heart sounds.  Exam reveals no gallop and no friction rub.   No murmur heard. Pulmonary/Chest: Effort normal and breath sounds normal. No respiratory distress. He has no wheezes. He has no rales.  Lymphadenopathy:    He has no cervical adenopathy.  Neurological: He is alert.  CN 2-12 intact, 5/5 strength in bilateral biceps, triceps, grip, quads, hamstrings, plantar and dorsiflexion, sensation to light touch intact in bilateral UE and LE, 2+ patellar reflexes  Skin: Skin is warm and dry. He is not diaphoretic.     Assessment/Plan: Please see individual problem list.  Sinus infection Patients sinus pressure and congestion most consistent with sinus infection, though patient with recent fall and has noted headache since that time making other causes possible. He appears neurologically intact at this time and is not on anticoagulant making intracranial bleed unlikely, though discussed obtaining CT scan to rule out chronic subdural and evaluate for any lesions given history of possible brain mass (though they report this has resolved per the last MRI) and patients daughter and patient opted for this. Will additionally start patient on renally dosed augmentin (discussed dosing  with pharmacist at [facility]). Given return precautions.   Right mandibular swelling Patient with a small area of swelling in his right mandible with no fluctuance or erythema or tenderness. Given patients poor dentition, suspect this is related to a dental infection, though discussed with patient and daughter that it could relate to another cause and they opted for  further investigation with CT maxillofacial to fully characterize the lesion. Discussed that patient will need to follow-up with a dentist for this issue tomorrow. Patients vitals are stable and he has no systemic signs of illness, so patient is stable for treatment with PO augmentin renally dosed and follow-up with dentist as an outpatient for further evaluation of likely dental infection. Discussed that if he were to have increased swelling, fevers, trouble swallowing, trouble breathing, pain, redness, chills, or if he felt ill he needed to seek immediate medical attention.     Orders Placed This Encounter  Procedures  . CT Head Wo Contrast    Standing Status: Future     Number of Occurrences: 1     Standing Expiration Date: 02/15/2016    Order Specific Question:  Reason for Exam (SYMPTOM  OR DIAGNOSIS REQUIRED)    Answer:  headache, s/p fall    Order Specific Question:  Preferred imaging location?    Answer:  [facility]  . CT Maxillofacial WO CM    Standing Status: Future     Number of Occurrences: 1     Standing Expiration Date: 02/15/2016    Order Specific Question:  Reason for Exam (SYMPTOM  OR DIAGNOSIS REQUIRED)    Answer:  right mandibular mass    Order Specific Question:  Preferred imaging location?    Answer:  [facility] ordered this encounter  Medications  . amoxicillin-clavulanate (AUGMENTIN) 500-125 MG per tablet    Sig: Take 1 tablet (500 mg total) by mouth every 12 (twelve) hours.    Dispense:  14 tablet    Refill:  0    Philip Gordon

## 2014-11-15 NOTE — Progress Notes (Signed)
Pre visit review using our clinic review tool, if applicable. No additional management support is needed unless otherwise documented below in the visit note. 

## 2014-11-15 NOTE — Addendum Note (Signed)
Addended by: Leone Haven on: 11/15/2014 05:32 PM   Modules accepted: Orders

## 2014-11-15 NOTE — Patient Instructions (Signed)
Nice to meet you. Please go get the CT scan of your head.  We will treat you for a sinus infection with augmentin. This will cover possible dental infection as well.  You need to see a dentist as soon as possible.  If the area of swelling worsens, becomes red, becomes painful, you have difficulty swallowing, develop fevers, chills, nausea, vomiting, numbness, weakness, worsening headache, change in vision, or any neurological changes please seek medical attention.

## 2014-11-15 NOTE — Telephone Encounter (Signed)
Called and spoke with the patient regarding the results of his CT scans. Advised of findings on CT head. No acute findings. Discussed findings of dental and odontogenic abscess on CT scan. Advised patient that this would need to be drained. Patient did not have any signs of systemic infection in the office today and was well appearing. Given his stable vitals and well appearance we will continue treatment with PO augmentin renally dosed and have the patient follow-up with his dentist in the morning. Advised on the importance of follow-up with dentist for removal and drainage. Advised that if he develop fever, chills, pain, increased swelling, trouble swallowing, sweats, or begins to feel ill he should go to the ED for further evaluation. He voiced understanding.

## 2014-11-15 NOTE — Assessment & Plan Note (Addendum)
Patient with a small area of swelling in his right mandible with no fluctuance or erythema or tenderness. Given patients poor dentition, suspect this is related to a dental infection, though discussed with patient and daughter that it could relate to another cause and they opted for further investigation with CT maxillofacial to fully characterize the lesion. Discussed that patient will need to follow-up with a dentist for this issue tomorrow. Patients vitals are stable and he has no systemic signs of illness, so patient is stable for treatment with PO augmentin renally dosed and follow-up with dentist as an outpatient for further evaluation of likely dental infection. Discussed that if he were to have increased swelling, fevers, trouble swallowing, trouble breathing, pain, redness, chills, or if he felt ill he needed to seek immediate medical attention.

## 2014-11-15 NOTE — Assessment & Plan Note (Signed)
Patients sinus pressure and congestion most consistent with sinus infection, though patient with recent fall and has noted headache since that time making other causes possible. He appears neurologically intact at this time and is not on anticoagulant making intracranial bleed unlikely, though discussed obtaining CT scan to rule out chronic subdural and evaluate for any lesions given history of possible brain mass (though they report this has resolved per the last MRI) and patients daughter and patient opted for this. Will additionally start patient on renally dosed augmentin (discussed dosing with pharmacist at [facility]). Given return precautions.

## 2014-11-15 NOTE — Telephone Encounter (Signed)
Called patient back to discuss possible referral to OMFS given apical dental and odontogenic abscess, though patient has already spoken to his dentist who will see him in the morning to further evaluate this issue and patient would prefer to see his dentist first. Advised that if his dentist is unable to treat the issue to let us know and we will refer him to an oral surgeon for drainage. He voiced understanding.

## 2014-11-16 ENCOUNTER — Telehealth: Payer: Self-pay | Admitting: Family Medicine

## 2014-11-16 NOTE — Telephone Encounter (Signed)
Called and spoke with the patient. He states he has an appointment with the dentist at 12:30. He reports the area has not gotten any bigger. He denies fevers and chills. Will await dentist intervention. I asked that he let us know what steps the dentist takes to resolve this issue.

## 2014-11-16 NOTE — Telephone Encounter (Signed)
Attempted to call the patient to f/u on whether he was able to see his dentist this morning. No answer. Left message asking patient to call back to the office. Will have nursing place call this afternoon to follow-up on this.

## 2014-12-09 ENCOUNTER — Telehealth: Payer: Self-pay | Admitting: *Deleted

## 2014-12-09 DIAGNOSIS — G35 Multiple sclerosis: Secondary | ICD-10-CM

## 2014-12-09 NOTE — Telephone Encounter (Signed)
Pt called states he was to believe he was going to be referred to Dr Manuella Ghazi.  Please advise

## 2014-12-10 ENCOUNTER — Other Ambulatory Visit: Payer: Self-pay | Admitting: Oncology

## 2014-12-10 NOTE — Telephone Encounter (Signed)
Notify pt that I have placed the order.  Someone should be calling him with an appt.

## 2014-12-20 ENCOUNTER — Other Ambulatory Visit: Payer: Self-pay | Admitting: Oncology

## 2015-01-12 ENCOUNTER — Inpatient Hospital Stay (HOSPITAL_BASED_OUTPATIENT_CLINIC_OR_DEPARTMENT_OTHER): Payer: Medicare Other | Admitting: Oncology

## 2015-01-12 ENCOUNTER — Inpatient Hospital Stay: Payer: Medicare Other | Attending: Oncology

## 2015-01-12 VITALS — BP 110/57 | HR 62 | Temp 97.5°F | Ht 67.0 in | Wt 148.8 lb

## 2015-01-12 DIAGNOSIS — Z23 Encounter for immunization: Secondary | ICD-10-CM | POA: Diagnosis not present

## 2015-01-12 DIAGNOSIS — G35 Multiple sclerosis: Secondary | ICD-10-CM | POA: Diagnosis not present

## 2015-01-12 DIAGNOSIS — F329 Major depressive disorder, single episode, unspecified: Secondary | ICD-10-CM | POA: Insufficient documentation

## 2015-01-12 DIAGNOSIS — G939 Disorder of brain, unspecified: Secondary | ICD-10-CM | POA: Diagnosis not present

## 2015-01-12 DIAGNOSIS — I10 Essential (primary) hypertension: Secondary | ICD-10-CM | POA: Diagnosis not present

## 2015-01-12 DIAGNOSIS — Z8546 Personal history of malignant neoplasm of prostate: Secondary | ICD-10-CM | POA: Insufficient documentation

## 2015-01-12 DIAGNOSIS — Z79899 Other long term (current) drug therapy: Secondary | ICD-10-CM

## 2015-01-12 DIAGNOSIS — C61 Malignant neoplasm of prostate: Secondary | ICD-10-CM

## 2015-01-12 DIAGNOSIS — E7800 Pure hypercholesterolemia, unspecified: Secondary | ICD-10-CM | POA: Insufficient documentation

## 2015-01-12 DIAGNOSIS — E538 Deficiency of other specified B group vitamins: Secondary | ICD-10-CM | POA: Diagnosis not present

## 2015-01-12 LAB — COMPREHENSIVE METABOLIC PANEL
ALBUMIN: 4.1 g/dL (ref 3.5–5.0)
ALK PHOS: 77 U/L (ref 38–126)
ALT: 10 U/L — ABNORMAL LOW (ref 17–63)
ANION GAP: 5 (ref 5–15)
AST: 19 U/L (ref 15–41)
BILIRUBIN TOTAL: 1.1 mg/dL (ref 0.3–1.2)
BUN: 17 mg/dL (ref 6–20)
CO2: 28 mmol/L (ref 22–32)
Calcium: 8.6 mg/dL — ABNORMAL LOW (ref 8.9–10.3)
Chloride: 101 mmol/L (ref 101–111)
Creatinine, Ser: 1.42 mg/dL — ABNORMAL HIGH (ref 0.61–1.24)
GFR, EST AFRICAN AMERICAN: 58 mL/min — AB (ref >60)
GFR, EST NON AFRICAN AMERICAN: 50 mL/min — AB (ref >60)
Glucose, Bld: 82 mg/dL (ref 65–99)
POTASSIUM: 3.7 mmol/L (ref 3.5–5.1)
Sodium: 134 mmol/L — ABNORMAL LOW (ref 135–145)
TOTAL PROTEIN: 7.1 g/dL (ref 6.5–8.1)

## 2015-01-12 LAB — CBC WITH DIFFERENTIAL/PLATELET
BASOS PCT: 1 %
Basophils Absolute: 0 10*3/uL (ref 0–0.1)
Eosinophils Absolute: 0.1 10*3/uL (ref 0–0.7)
Eosinophils Relative: 1 %
HEMATOCRIT: 42.9 % (ref 40.0–52.0)
Hemoglobin: 14.8 g/dL (ref 13.0–18.0)
LYMPHS ABS: 0.8 10*3/uL — AB (ref 1.0–3.6)
Lymphocytes Relative: 17 %
MCH: 30.9 pg (ref 26.0–34.0)
MCHC: 34.5 g/dL (ref 32.0–36.0)
MCV: 89.6 fL (ref 80.0–100.0)
MONO ABS: 0.6 10*3/uL (ref 0.2–1.0)
MONOS PCT: 13 %
NEUTROS ABS: 3.3 10*3/uL (ref 1.4–6.5)
Neutrophils Relative %: 68 %
Platelets: 261 10*3/uL (ref 150–440)
RBC: 4.79 MIL/uL (ref 4.40–5.90)
RDW: 12.9 % (ref 11.5–14.5)
WBC: 4.8 10*3/uL (ref 3.8–10.6)

## 2015-01-12 LAB — PSA: PSA: 0.04 ng/mL (ref 0.00–4.00)

## 2015-01-12 MED ORDER — CYANOCOBALAMIN 1000 MCG/ML IJ SOLN
1000.0000 ug | Freq: Once | INTRAMUSCULAR | Status: AC
  Administered 2015-01-12: 1000 ug via INTRAMUSCULAR
  Filled 2015-01-12: qty 1

## 2015-01-12 MED ORDER — INFLUENZA VAC SPLIT QUAD 0.5 ML IM SUSY
0.5000 mL | PREFILLED_SYRINGE | Freq: Once | INTRAMUSCULAR | Status: AC
  Administered 2015-01-12: 0.5 mL via INTRAMUSCULAR
  Filled 2015-01-12: qty 0.5

## 2015-01-13 ENCOUNTER — Telehealth: Payer: Self-pay | Admitting: *Deleted

## 2015-01-13 NOTE — Telephone Encounter (Signed)
Attempted to call patient with PSA results.  No answer and no option to leave message.

## 2015-01-13 NOTE — Telephone Encounter (Signed)
-----   Message from Forest Gleason, MD sent at 01/13/2015  8:40 AM EDT ----- Regarding: psa Patient is interested in knowing that his PSA 0.4

## 2015-01-15 ENCOUNTER — Encounter: Payer: Self-pay | Admitting: Oncology

## 2015-01-15 NOTE — Progress Notes (Signed)
[facility] @ [facility] Telephone:([phone]  Fax:([phone]) [place]: Apr 10, 1950  MR#: [id number]  WHQ#:[id number]  Patient Care Team: Einar Pheasant, MD as PCP - General (Internal Medicine)  CHIEF COMPLAINT:  Chief Complaint  Patient presents with  . Multiple Myeloma    no complaints, follow up    Oncology History   63 year old male with known history of prostate cancer status post I-125 interstitial implant patient has multiple sclerosis progressing as well as an 8 mm enhancing lesion in the posterior parietal lobe of the brain with surrounding edema. HPI:        CA of prostate ([employer])   01/16/2012 Initial Diagnosis CA of prostate    Oncology Flowsheet 01/30/2013 07/06/2013 08/05/2013 01/07/2014 10/12/2014 11/06/2014 01/12/2015  cyanocobalamin ((VITAMIN B-12)) IM 1,000 mcg 1,000 mcg 1,000 mcg 1,000 mcg 1,000 mcg - 1,000 mcg  predniSONE (DELTASONE) PO - - - - - 40 mg -    INTERVAL HISTORY:  63 year old gentleman with a previous history of multiple sclerosis patient is in wheelchair.  Patient had previous history of carcinoma prostate as well as abnormal brain scan Patient was on prednisone gradually improved MRI scan which was repeated revealed abnormality almost resolved. Appetite is improved.  Patient is gaining weight.    Patient is off prednisone now for more than a month.  Appetite remains stable.  No headache.  Patient is here for ongoing evaluation regarding carcinoma prostate and abnormal CT scan of brain REVIEW OF SYSTEMS:   Gen. status: Patient is in wheelchair. Gaining weight.   Slurred Speech due to multiple sclerosis GI no nausea no vomiting no diarrhea Musculoskeletal system no bony pains. Neurological system received dual neurological deficit due to multiple sclerosis Lower extremity no edema Skin: No rash Cardiac no chest pain Lungs shortness of breath on exertion As per HPI. Otherwise, a complete review of systems is negatve.  PAST  MEDICAL HISTORY: Past Medical History  Diagnosis Date  . Hypertension   . Hypercholesterolemia   . Multiple sclerosis (HCC)     Sees Dr Ala Bent [facility])  . Prostate cancer [facility])     Followed by Dr [name] and Dr [name]  . Depression     PAST SURGICAL HISTORY: Past Surgical History  Procedure Laterality Date  . Knee surgery      FAMILY HISTORY Family History  Problem Relation Age of Onset  . Hypertension Mother   . Stroke Father   . Diabetes Maternal Grandmother   . Diabetes Maternal Grandfather   . Leukemia Sister     died - age 2    ADVANCED DIRECTIVES:  No flowsheet data found.  HEALTH MAINTENANCE: Social History  Substance Use Topics  . Smoking status: Never Smoker   . Smokeless tobacco: Current User    Types: Chew     Comment: Is going to try and cut down on the amount.   . Alcohol Use: No      No Known Allergies  Current Outpatient Prescriptions  Medication Sig Dispense Refill  . amLODipine (NORVASC) 5 MG tablet Take 1 tablet (5 mg total) by mouth daily. 90 tablet 1  . cyanocobalamin (,VITAMIN B-12,) 1000 MCG/ML injection Inject 1 mL (1,000 mcg total) into the muscle once. q month (Patient taking differently: Inject 1,000 mcg into the muscle every 30 (thirty) days. ) 10 mL 1  . finasteride (PROSCAR) 5 MG tablet Take 1 tablet (5 mg total) by mouth daily. 90 tablet 1  . hydrochlorothiazide (MICROZIDE) 12.5  MG capsule TAKE 1 CAPSULE (12.5 MG TOTAL) BY MOUTH DAILY. 90 capsule 1  . LYRICA 25 MG capsule TAKE ONE CAPSULE BY MOUTH TWICE A DAY 60 capsule 0  . mirtazapine (REMERON) 15 MG tablet Take 15 mg by mouth at bedtime.    . potassium chloride (K-DUR) 10 MEQ tablet Take 1 tablet (10 mEq total) by mouth daily. 30 tablet 3  . tiZANidine (ZANAFLEX) 4 MG tablet TAKE 1 TABLET BY MOUTH NIGHTLY 30 tablet 4   No current facility-administered medications for this visit.    OBJECTIVE:  Filed Vitals:   01/12/15 1136  BP: 110/57  Pulse: 62  Temp: 97.5 F (36.4  C)     Body mass index is 23.3 kg/(m^2).    ECOG FS:2 - Symptomatic, <50% confined to bed  PHYSICAL EXAM: General  status: Performance status is 2 Patient has not lost significant weight recent is in wheelchair because of multiple sclerosis HEENT: No evidence of stomatitis. Sclera and conjunctivae :: No jaundice.   pale looking. Lungs: Air  entry equal on both sides.  No rhonchi.  No rales.  Cardiac: Heart sounds are normal.  No pericardial rub.  No murmur. Lymphatic system: Cervical, axillary, inguinal, lymph nodes not palpable GI: Abdomen is soft.  No ascites.  Liver spleen not palpable.  No tenderness.  Bowel sounds are within normal limit Lower extremity: No edema Neurological system: Higher functions, neurological deficit due to multiple sclerosis Skin: No rash.  No ecchymosis.Marland Kitchen  LAB RESULTS: All lab data has been reviewed    PSA is 0.04 ASSESSMENT: Cancer of prostate PSA is pending.  Presently not on any active therapy 2.  Multiple sclerosissclerosis not on any active therapy patient has a significant residual effect   MEDICAL DECISION MAKING:  Abnormal MRI scan.  Repeat MRI scan has shown that abnormality is completely resolved. Suspected lymphoma versus multiple sclerosis Patient is off prednisone therapy doing very well without any significant neurological symptoms. Patient was advised to be off prednisone Patient has been discharged from my care will have MRI scan done once a year If that abnormality reoccurs then further evaluation would be planned.  Lymphoma remains is suspicious diagnosis. PSA is less than 0.04 which is stable no further intervention needed At this point in time patient will be followed by primary care physician with repeat MRI scan of brain once a year or before if patient has any new neurological symptoms  Pat ient expressed understanding and was in agreement with this plan. He also understands that He can call clinic at any time with any questions,  concerns, or complaints.    No matching staging information was found for the patient.  Forest Gleason, MD   01/15/2015 10:43 AM

## 2015-02-01 ENCOUNTER — Other Ambulatory Visit: Payer: Self-pay | Admitting: Oncology

## 2015-02-04 ENCOUNTER — Other Ambulatory Visit: Payer: Self-pay | Admitting: Oncology

## 2015-02-04 DIAGNOSIS — M21371 Foot drop, right foot: Secondary | ICD-10-CM | POA: Insufficient documentation

## 2015-02-04 DIAGNOSIS — M21372 Foot drop, left foot: Secondary | ICD-10-CM | POA: Diagnosis not present

## 2015-02-04 DIAGNOSIS — G35 Multiple sclerosis: Secondary | ICD-10-CM | POA: Diagnosis not present

## 2015-02-04 DIAGNOSIS — R270 Ataxia, unspecified: Secondary | ICD-10-CM | POA: Diagnosis not present

## 2015-02-04 DIAGNOSIS — R471 Dysarthria and anarthria: Secondary | ICD-10-CM | POA: Diagnosis not present

## 2015-02-07 ENCOUNTER — Other Ambulatory Visit: Payer: Self-pay | Admitting: *Deleted

## 2015-02-07 ENCOUNTER — Other Ambulatory Visit: Payer: Self-pay | Admitting: Oncology

## 2015-02-07 MED ORDER — PREGABALIN 25 MG PO CAPS: 25.0000 mg | ORAL_CAPSULE | Freq: Two times a day (BID) | ORAL | Status: DC

## 2015-02-07 NOTE — Telephone Encounter (Signed)
faxed

## 2015-02-10 ENCOUNTER — Other Ambulatory Visit: Payer: Self-pay | Admitting: Neurology

## 2015-02-10 DIAGNOSIS — C8589 Other specified types of non-Hodgkin lymphoma, extranodal and solid organ sites: Secondary | ICD-10-CM

## 2015-02-10 DIAGNOSIS — C8339 Primary central nervous system lymphoma: Secondary | ICD-10-CM

## 2015-03-03 ENCOUNTER — Ambulatory Visit
Admission: RE | Admit: 2015-03-03 | Discharge: 2015-03-03 | Disposition: A | Discharge status: Home / Self Care | Payer: Medicare Other | Source: Ambulatory Visit | Attending: Neurology | Admitting: Neurology

## 2015-03-03 DIAGNOSIS — C8589 Other specified types of non-Hodgkin lymphoma, extranodal and solid organ sites: Secondary | ICD-10-CM | POA: Diagnosis present

## 2015-03-03 DIAGNOSIS — R531 Weakness: Secondary | ICD-10-CM | POA: Insufficient documentation

## 2015-03-03 DIAGNOSIS — G35 Multiple sclerosis: Secondary | ICD-10-CM | POA: Diagnosis not present

## 2015-03-03 DIAGNOSIS — R200 Anesthesia of skin: Secondary | ICD-10-CM | POA: Diagnosis not present

## 2015-03-03 MED ORDER — GADOBENATE DIMEGLUMINE 529 MG/ML IV SOLN
15.0000 mL | Freq: Once | INTRAVENOUS | Status: AC | PRN
  Administered 2015-03-03: 13 mL via INTRAVENOUS

## 2015-03-23 ENCOUNTER — Encounter: Payer: Self-pay | Admitting: *Deleted

## 2015-03-24 ENCOUNTER — Ambulatory Visit: Payer: Medicare Other | Admitting: Internal Medicine

## 2015-04-05 ENCOUNTER — Ambulatory Visit: Payer: Medicare Other | Admitting: Internal Medicine

## 2015-05-11 ENCOUNTER — Other Ambulatory Visit: Payer: Self-pay | Admitting: Oncology

## 2015-05-14 ENCOUNTER — Other Ambulatory Visit: Payer: Self-pay | Admitting: Oncology

## 2015-05-17 ENCOUNTER — Telehealth: Payer: Self-pay | Admitting: *Deleted

## 2015-05-17 NOTE — Telephone Encounter (Signed)
Requesting refill for Lyrica.  Called Philip Gordon back and informed her that patient is no longer being followed by our clinic and all medications will have to be filled by patient's PCP.  Verbalized understanding.

## 2015-05-19 ENCOUNTER — Other Ambulatory Visit: Payer: Self-pay | Admitting: Oncology

## 2015-06-07 ENCOUNTER — Encounter: Payer: Self-pay | Admitting: Internal Medicine

## 2015-06-07 ENCOUNTER — Ambulatory Visit (INDEPENDENT_AMBULATORY_CARE_PROVIDER_SITE_OTHER): Payer: Medicare Other | Admitting: Internal Medicine

## 2015-06-07 ENCOUNTER — Telehealth: Payer: Self-pay | Admitting: Internal Medicine

## 2015-06-07 VITALS — BP 140/80 | HR 69 | Temp 97.6°F | Resp 18 | Ht 67.0 in | Wt 148.8 lb

## 2015-06-07 DIAGNOSIS — I10 Essential (primary) hypertension: Secondary | ICD-10-CM | POA: Diagnosis not present

## 2015-06-07 DIAGNOSIS — G35 Multiple sclerosis: Secondary | ICD-10-CM | POA: Diagnosis not present

## 2015-06-07 DIAGNOSIS — E538 Deficiency of other specified B group vitamins: Secondary | ICD-10-CM

## 2015-06-07 DIAGNOSIS — E7800 Pure hypercholesterolemia, unspecified: Secondary | ICD-10-CM | POA: Diagnosis not present

## 2015-06-07 DIAGNOSIS — G939 Disorder of brain, unspecified: Secondary | ICD-10-CM

## 2015-06-07 DIAGNOSIS — C61 Malignant neoplasm of prostate: Secondary | ICD-10-CM

## 2015-06-07 MED ORDER — HYDROCHLOROTHIAZIDE 12.5 MG PO CAPS: ORAL_CAPSULE | ORAL | Status: DC

## 2015-06-07 MED ORDER — CYANOCOBALAMIN 1000 MCG/ML IJ SOLN
1000.0000 ug | Freq: Once | INTRAMUSCULAR | Status: AC
  Administered 2015-06-07: 1000 ug via INTRAMUSCULAR

## 2015-06-07 MED ORDER — AMLODIPINE BESYLATE 5 MG PO TABS: 5.0000 mg | ORAL_TABLET | Freq: Every day | ORAL | Status: DC

## 2015-06-07 NOTE — Telephone Encounter (Signed)
Pt's POA called. Wanted Dr. Nicki Reaper to know that pt is taking aricept prescribed by [name].

## 2015-06-07 NOTE — Progress Notes (Signed)
Patient ID: Philip Gordon, male   DOB: 01/10/1951, 65 y.o.   MRN: [id number]   Subjective:    Patient ID: Philip Gordon, male    DOB: Jun 26, 1950, 65 y.o.   MRN: [id number]  HPI  Patient here for a scheduled follow up.  Has a history of hypercholesterolemia, hypertension, prostate cancer and MS.  He is accompanied by his friend.  History obtained from both of them.  He is going to the gym 3 days per week. Has someone come to his house three hours per day for three days per week.  Receives meals on wheels.  Is eating.  Saw neurology.  Started him on aricept.  No abdominal pain or cramping.  Bowels stable.     Past Medical History  Diagnosis Date  . Hypertension   . Hypercholesterolemia   . Multiple sclerosis (HCC)     Sees Dr Ala Bent [facility])  . Prostate cancer [facility])     Followed by Dr [name] and Dr [name]  . Depression    Past Surgical History  Procedure Laterality Date  . Knee surgery     Family History  Problem Relation Age of Onset  . Hypertension Mother   . Stroke Father   . Diabetes Maternal Grandmother   . Diabetes Maternal Grandfather   . Leukemia Sister     died - age 57   Social History   Social History  . Marital Status: Divorced    Spouse Name: N/A  . Number of Children: N/A  . Years of Education: N/A   Social History Main Topics  . Smoking status: Never Smoker   . Smokeless tobacco: Current User    Types: Chew     Comment: Is going to try and cut down on the amount.   . Alcohol Use: No  . Drug Use: No  . Sexual Activity: Not Asked   Other Topics Concern  . None   Social History Narrative    Outpatient Encounter Prescriptions as of 06/07/2015  Medication Sig  . amLODipine (NORVASC) 5 MG tablet Take 1 tablet (5 mg total) by mouth daily.  . cyanocobalamin (,VITAMIN B-12,) 1000 MCG/ML injection Inject 1 mL (1,000 mcg total) into the muscle once. q month (Patient taking differently: Inject 1,000 mcg into the muscle every 30 (thirty) days. )  .  donepezil (ARICEPT) 10 MG tablet TAKE 1/2 TABLET DAILY FOR THEN FIRST MONTH THEN TAKE ONE TABLET DAILY  . finasteride (PROSCAR) 5 MG tablet Take 1 tablet (5 mg total) by mouth daily.  . hydrochlorothiazide (MICROZIDE) 12.5 MG capsule TAKE 1 CAPSULE (12.5 MG TOTAL) BY MOUTH DAILY.  Marland Kitchen LYRICA 25 MG capsule TAKE ONE CAPSULE BY MOUTH TWICE DAILY  . mirtazapine (REMERON) 15 MG tablet TAKE 1/2 TABLET BY MOUTH AT BEDTIME  . tiZANidine (ZANAFLEX) 4 MG tablet TAKE 1 TABLET BY MOUTH NIGHTLY  . [DISCONTINUED] amLODipine (NORVASC) 5 MG tablet Take 1 tablet (5 mg total) by mouth daily.  . [DISCONTINUED] hydrochlorothiazide (MICROZIDE) 12.5 MG capsule TAKE 1 CAPSULE (12.5 MG TOTAL) BY MOUTH DAILY.  . [DISCONTINUED] potassium chloride (K-DUR) 10 MEQ tablet Take 1 tablet (10 mEq total) by mouth daily.  . [EXPIRED] cyanocobalamin ((VITAMIN B-12)) injection 1,000 mcg    No facility-administered encounter medications on file as of 06/07/2015.    Review of Systems  Constitutional: Negative for appetite change and unexpected weight change.  HENT: Negative for congestion and sinus pressure.   Respiratory: Negative for cough, chest tightness and shortness of breath.  Cardiovascular: Negative for chest pain, palpitations and leg swelling.  Gastrointestinal: Negative for nausea, vomiting, abdominal pain and diarrhea.  Genitourinary: Negative for dysuria and difficulty urinating.  Musculoskeletal: Negative for back pain and joint swelling.       Some posterior shoulder discomfort.  Stable.    Skin: Negative for color change and rash.  Neurological: Negative for dizziness, light-headedness and headaches.  Psychiatric/Behavioral: Negative for dysphoric mood and agitation.       Objective:    Physical Exam  Constitutional: He appears well-developed and well-nourished. No distress.  HENT:  Nose: Nose normal.  Mouth/Throat: Oropharynx is clear and moist.  Neck: Neck supple. No thyromegaly present.    Cardiovascular: Normal rate and regular rhythm.   Pulmonary/Chest: Effort normal and breath sounds normal. No respiratory distress.  Abdominal: Soft. Bowel sounds are normal. There is no tenderness.  Musculoskeletal: He exhibits no edema or tenderness.  Lymphadenopathy:    He has no cervical adenopathy.  Skin: No rash noted. No erythema.  Psychiatric: He has a normal mood and affect. His behavior is normal.    BP 140/80 mmHg  Pulse 69  Temp(Src) 97.6 F (36.4 C) (Oral)  Resp 18  Ht 5\' 7"  (1.702 m)  Wt 148 lb 12 oz (67.473 kg)  BMI 23.29 kg/m2  SpO2 99% Wt Readings from Last 3 Encounters:  06/07/15 148 lb 12 oz (67.473 kg)  01/12/15 148 lb 13 oz (67.5 kg)  11/05/14 150 lb (68.04 kg)     Lab Results  Component Value Date   WBC 5.9 06/08/2015   HGB 14.9 06/08/2015   HCT 30.0* 06/08/2015   PLT 316 06/08/2015   GLUCOSE 81 06/07/2015   CHOL 205* 08/05/2013   TRIG 128.0 08/05/2013   HDL 45.80 08/05/2013   LDLDIRECT 129.6 01/30/2013   LDLCALC 134* 08/05/2013   ALT 15 06/07/2015   AST 18 06/07/2015   NA 139 06/07/2015   K 3.8 06/07/2015   CL 101 06/07/2015   CREATININE 1.39 06/07/2015   BUN 13 06/07/2015   CO2 31 06/07/2015   TSH 1.30 06/07/2015   PSA 0.05* 06/07/2015    Mr Brain W Wo Contrast  03/03/2015  CLINICAL DATA:  CNS lymphoma.  Multiple sclerosis. EXAM: MRI HEAD WITHOUT AND WITH CONTRAST TECHNIQUE: Multiplanar, multiecho pulse sequences of the brain and surrounding structures were obtained without and with intravenous contrast. CONTRAST:  7mL MULTIHANCE GADOBENATE DIMEGLUMINE 529 MG/ML IV SOLN COMPARISON:  Head CT 11/15/2014 and MRI 06/08/2014 FINDINGS: There is no evidence of acute infarct, midline shift, or extra-axial fluid collection. Moderate cerebral atrophy is again seen. A focus of chronic microhemorrhage in the high posterior medial left frontal lobe is unchanged. Susceptibility artifacts/blood products along the medial left parietal lobe are much less  conspicuous. A tiny developmental venous anomaly in the high posterior medial right frontal lobe is unchanged. The residual enhancement in the medial left parietal lobe on the prior study has now completely resolved. There is unchanged, minimal residual cortical/ subcortical T2 hyperintensity in this region. No new enhancing brain lesions are identified. Foci of T2 hyperintensity elsewhere in the cerebral white matter bilaterally are unchanged, greatest in the periventricular regions and with involvement of the splenium of the corpus callosum, compatible with history of multiple sclerosis. Small focus of enhancement in the right parietal scalp is unchanged. Orbits are unremarkable on this nondedicated examination. Chronic left sphenoid sinusitis is noted. Mastoid air cells are clear. IMPRESSION: Complete resolution of enhancement in the medial left parietal lobe with  unchanged, minimal residual T2 hyperintensity/gliosis. No new intracranial abnormality. Electronically Signed   By: Logan Bores M.D.   On: 03/03/2015 14:47       Assessment & Plan:   Problem List Items Addressed This Visit    B12 deficiency    Continue b12 injections.        Relevant Medications   cyanocobalamin ((VITAMIN B-12)) injection 1,000 mcg (Completed)   Brain lesion    Resolution of brain enhancing lesion.  Off prednisone.  Dr [name] has released.  Recommended f/u MRI brain once a year.        CA of prostate ([place])   Relevant Orders   PSA (Completed)   Hypercholesteremia    Low cholesterol diet and exercise.  Follow lipid panel.        Relevant Medications   amLODipine (NORVASC) 5 MG tablet   hydrochlorothiazide (MICROZIDE) 12.5 MG capsule   Hypertension - Primary    Blood pressure under reasonable control.  Same medication regimen.  Follow metabolic panel.        Relevant Medications   amLODipine (NORVASC) 5 MG tablet   hydrochlorothiazide (MICROZIDE) 12.5 MG capsule   Other Relevant Orders   TSH (Completed)     Malignant neoplasm of prostate ([place])    Has been being followed at the cancer center.  Check psa.        Multiple sclerosis ([place])    Seeing Dr Manuella Ghazi.  See his note for details.  On aricept.  Follow.  Appears to be stable.        Relevant Orders   Comprehensive metabolic panel (Completed)   CBC with Differential/Platelet       Einar Pheasant, MD

## 2015-06-07 NOTE — Telephone Encounter (Signed)
Added medication to patients med list

## 2015-06-07 NOTE — Progress Notes (Signed)
Pre-visit discussion using our clinic review tool. No additional management support is needed unless otherwise documented below in the visit note.  

## 2015-06-08 ENCOUNTER — Encounter: Payer: Self-pay | Admitting: *Deleted

## 2015-06-08 ENCOUNTER — Other Ambulatory Visit: Payer: Medicare Other

## 2015-06-08 DIAGNOSIS — G35 Multiple sclerosis: Secondary | ICD-10-CM

## 2015-06-08 LAB — CBC WITH DIFFERENTIAL/PLATELET
Basophils Absolute: 0.1 10*3/uL (ref 0.0–0.1)
Basophils Relative: 1 % (ref 0–1)
Eosinophils Absolute: 0.1 10*3/uL (ref 0.0–0.7)
Eosinophils Relative: 2 % (ref 0–5)
HCT: 30 % — ABNORMAL LOW (ref 39.0–52.0)
Hemoglobin: 14.9 g/dL (ref 13.0–17.0)
Lymphocytes Relative: 23 % (ref 12–46)
Lymphs Abs: 1.4 10*3/uL (ref 0.7–4.0)
MCH: 46.4 pg — ABNORMAL HIGH (ref 26.0–34.0)
MCHC: 34.9 g/dL (ref 30.0–36.0)
MCV: 93.5 fL (ref 78.0–100.0)
MPV: 10.1 fL (ref 8.6–12.4)
Monocytes Absolute: 0.6 10*3/uL (ref 0.1–1.0)
Monocytes Relative: 11 % (ref 3–12)
Neutro Abs: 3.7 10*3/uL (ref 1.7–7.7)
Neutrophils Relative %: 63 % (ref 43–77)
Platelets: 316 10*3/uL (ref 150–400)
RBC: 3.21 MIL/uL — ABNORMAL LOW (ref 4.22–5.81)
RDW: 13.9 % (ref 11.5–15.5)
WBC: 5.9 10*3/uL (ref 4.0–10.5)

## 2015-06-08 LAB — COMPREHENSIVE METABOLIC PANEL
ALBUMIN: 4.1 g/dL (ref 3.5–5.2)
ALK PHOS: 108 U/L (ref 39–117)
ALT: 15 U/L (ref 0–53)
AST: 18 U/L (ref 0–37)
BUN: 13 mg/dL (ref 6–23)
CO2: 31 mEq/L (ref 19–32)
CREATININE: 1.39 mg/dL (ref 0.40–1.50)
Calcium: 9.5 mg/dL (ref 8.4–10.5)
Chloride: 101 mEq/L (ref 96–112)
GFR: 54.2 mL/min — ABNORMAL LOW (ref >60.00)
GLUCOSE: 81 mg/dL (ref 70–99)
Potassium: 3.8 mEq/L (ref 3.5–5.1)
SODIUM: 139 meq/L (ref 135–145)
TOTAL PROTEIN: 6.6 g/dL (ref 6.0–8.3)
Total Bilirubin: 0.6 mg/dL (ref 0.2–1.2)

## 2015-06-08 LAB — PSA: PSA: 0.05 ng/mL — ABNORMAL LOW (ref 0.10–4.00)

## 2015-06-08 LAB — TSH: TSH: 1.3 u[IU]/mL (ref 0.35–4.50)

## 2015-06-18 ENCOUNTER — Encounter: Payer: Self-pay | Admitting: Internal Medicine

## 2015-06-18 NOTE — Assessment & Plan Note (Signed)
Has been being followed at the cancer center.  Check psa.

## 2015-06-18 NOTE — Assessment & Plan Note (Signed)
Resolution of brain enhancing lesion.  Off prednisone.  Dr [name] has released.  Recommended f/u MRI brain once a year.

## 2015-06-18 NOTE — Assessment & Plan Note (Signed)
Low cholesterol diet and exercise.  Follow lipid panel.   

## 2015-06-18 NOTE — Assessment & Plan Note (Signed)
Blood pressure under reasonable control.  Same medication regimen.  Follow metabolic panel.

## 2015-06-18 NOTE — Assessment & Plan Note (Signed)
Seeing Dr Manuella Ghazi.  See his note for details.  On aricept.  Follow.  Appears to be stable.

## 2015-06-18 NOTE — Assessment & Plan Note (Signed)
Continue b12 injections.  

## 2015-07-18 DIAGNOSIS — G35 Multiple sclerosis: Secondary | ICD-10-CM | POA: Diagnosis not present

## 2015-08-10 ENCOUNTER — Other Ambulatory Visit: Payer: Self-pay | Admitting: Oncology

## 2015-08-31 ENCOUNTER — Telehealth: Payer: Self-pay | Admitting: Internal Medicine

## 2015-08-31 NOTE — Telephone Encounter (Signed)
Pt wants to speak to dr. Bary Leriche nurse. That is all he put on the vm. Please cb

## 2015-08-31 NOTE — Telephone Encounter (Signed)
Patient usually gets his refill of finasteride from Dr [name] (urologist) in [place].  He has not been there in over a year and can't make an appointment at this time to see him.  He would like to know if Dr Nicki Reaper can refill this medication for him?  He uses mail order for his refills.

## 2015-09-01 NOTE — Telephone Encounter (Signed)
This was sent back to me.  See my attached note.

## 2015-09-01 NOTE — Telephone Encounter (Signed)
Patient is aware and will call back if refill is needed.

## 2015-09-01 NOTE — Telephone Encounter (Signed)
He has a history of prostate cancer.  He needs to f/u with Dr [name].  Please see if he can schedule an appt in the future and see if they will refill until appt.  If a problem, let me know and will see about refill until he can get in.

## 2015-10-18 ENCOUNTER — Encounter: Payer: Self-pay | Admitting: Internal Medicine

## 2015-10-18 ENCOUNTER — Ambulatory Visit (INDEPENDENT_AMBULATORY_CARE_PROVIDER_SITE_OTHER): Payer: Medicare Other | Admitting: Internal Medicine

## 2015-10-18 VITALS — BP 118/72 | HR 52 | Wt 147.0 lb

## 2015-10-18 DIAGNOSIS — C61 Malignant neoplasm of prostate: Secondary | ICD-10-CM | POA: Diagnosis not present

## 2015-10-18 DIAGNOSIS — E538 Deficiency of other specified B group vitamins: Secondary | ICD-10-CM

## 2015-10-18 DIAGNOSIS — E7800 Pure hypercholesterolemia, unspecified: Secondary | ICD-10-CM

## 2015-10-18 DIAGNOSIS — I10 Essential (primary) hypertension: Secondary | ICD-10-CM

## 2015-10-18 DIAGNOSIS — G35 Multiple sclerosis: Secondary | ICD-10-CM

## 2015-10-18 LAB — BASIC METABOLIC PANEL
BUN: 15 mg/dL (ref 6–23)
CALCIUM: 9.8 mg/dL (ref 8.4–10.5)
CHLORIDE: 101 meq/L (ref 96–112)
CO2: 31 meq/L (ref 19–32)
Creatinine, Ser: 1.18 mg/dL (ref 0.40–1.50)
GFR: 65.4 mL/min (ref >60.00)
Glucose, Bld: 72 mg/dL (ref 70–99)
Potassium: 4.6 mEq/L (ref 3.5–5.1)
SODIUM: 138 meq/L (ref 135–145)

## 2015-10-18 LAB — LIPID PANEL
CHOL/HDL RATIO: 4
Cholesterol: 279 mg/dL — ABNORMAL HIGH (ref 0–200)
HDL: 62.4 mg/dL (ref >39.00)
LDL Cholesterol: 183 mg/dL — ABNORMAL HIGH (ref 0–99)
NONHDL: 216.54
Triglycerides: 166 mg/dL — ABNORMAL HIGH (ref 0.0–149.0)
VLDL: 33.2 mg/dL (ref 0.0–40.0)

## 2015-10-18 LAB — VITAMIN B12: VITAMIN B 12: 299 pg/mL (ref 211–911)

## 2015-10-18 LAB — HEPATIC FUNCTION PANEL
ALBUMIN: 4.4 g/dL (ref 3.5–5.2)
ALT: 21 U/L (ref 0–53)
AST: 19 U/L (ref 0–37)
Alkaline Phosphatase: 108 U/L (ref 39–117)
Bilirubin, Direct: 0.1 mg/dL (ref 0.0–0.3)
Total Bilirubin: 0.9 mg/dL (ref 0.2–1.2)
Total Protein: 6.7 g/dL (ref 6.0–8.3)

## 2015-10-18 LAB — PSA, MEDICARE: PSA: 0.04 ng/mL — AB (ref 0.10–4.00)

## 2015-10-18 MED ORDER — FINASTERIDE 5 MG PO TABS: 5.0000 mg | ORAL_TABLET | Freq: Every day | ORAL | 1 refills | Status: DC

## 2015-10-18 MED ORDER — AZELASTINE HCL 0.1 % NA SOLN: 1.0000 | Freq: Two times a day (BID) | NASAL | 1 refills | Status: DC

## 2015-10-18 NOTE — Progress Notes (Signed)
Patient ID: Philip Gordon, male   DOB: 11-28-1950, 65 y.o.   MRN: [id number]   Subjective:    Patient ID: Philip Gordon, male    DOB: November 11, 1950, 65 y.o.   MRN: [id number]  HPI  Patient with past history of hypercholesterolemia, prostate cancer and MS.  He is accompanied by his niece.  History obtained from both of them.  His MS limits his activity.  He has caretakers in his home now.  Gets meals on wheels.  Had a previous fall.  No residual problem from the fall.  Persistent pain posterior shoulder. Has tried therapy on multiple occasions.  Given medication by neurology.  Eating.  No nausea or vomiting.  No abdominal pain or cramping.  Bowels stable.   Needs form completed for [employer].    Past Medical History:  Diagnosis Date  . Depression   . Hypercholesterolemia   . Hypertension   . Multiple sclerosis (HCC)    Sees Dr Ala Bent [facility])  . Prostate cancer [facility])    Followed by Dr [name] and Dr [name]   Past Surgical History:  Procedure Laterality Date  . KNEE SURGERY     Family History  Problem Relation Age of Onset  . Hypertension Mother   . Stroke Father   . Diabetes Maternal Grandmother   . Diabetes Maternal Grandfather   . Leukemia Sister     died - age 79   Social History   Social History  . Marital status: Divorced    Spouse name: N/A  . Number of children: N/A  . Years of education: N/A   Social History Main Topics  . Smoking status: Never Smoker  . Smokeless tobacco: Current User    Types: Chew     Comment: Is going to try and cut down on the amount.   . Alcohol use No  . Drug use: No  . Sexual activity: Not Asked   Other Topics Concern  . None   Social History Narrative  . None    Outpatient Encounter Prescriptions as of 10/18/2015  Medication Sig  . amLODipine (NORVASC) 5 MG tablet Take 1 tablet (5 mg total) by mouth daily.  . cyanocobalamin (,VITAMIN B-12,) 1000 MCG/ML injection Inject 1 mL (1,000 mcg total) into the muscle once. q  month (Patient taking differently: Inject 1,000 mcg into the muscle every 30 (thirty) days. )  . donepezil (ARICEPT) 10 MG tablet TAKE 1/2 TABLET DAILY FOR THEN FIRST MONTH THEN TAKE ONE TABLET DAILY  . hydrochlorothiazide (MICROZIDE) 12.5 MG capsule TAKE 1 CAPSULE (12.5 MG TOTAL) BY MOUTH DAILY.  . mirtazapine (REMERON) 15 MG tablet TAKE 1/2 TABLET BY MOUTH AT BEDTIME  . tiZANidine (ZANAFLEX) 4 MG tablet TAKE 1 TABLET BY MOUTH NIGHTLY  . azelastine (ASTELIN) 0.1 % nasal spray Place 1 spray into both nostrils 2 (two) times daily. Use in each nostril as directed  . finasteride (PROSCAR) 5 MG tablet Take 1 tablet (5 mg total) by mouth daily.  . [DISCONTINUED] finasteride (PROSCAR) 5 MG tablet Take 1 tablet (5 mg total) by mouth daily. (Patient not taking: Reported on 10/18/2015)  . [DISCONTINUED] LYRICA 25 MG capsule TAKE ONE CAPSULE BY MOUTH TWICE DAILY (Patient not taking: Reported on 10/18/2015)   No facility-administered encounter medications on file as of 10/18/2015.     Review of Systems  Constitutional: Negative for appetite change and unexpected weight change.  HENT: Negative for congestion and sinus pressure.   Respiratory: Negative for cough, chest tightness  and shortness of breath.   Cardiovascular: Negative for chest pain, palpitations and leg swelling.  Gastrointestinal: Negative for abdominal pain, diarrhea, nausea and vomiting.  Genitourinary: Negative for difficulty urinating and dysuria.  Musculoskeletal: Negative for joint swelling.       Posterior shoulder pain as outlined.    Skin: Negative for color change and rash.  Neurological: Negative for dizziness, light-headedness and headaches.  Psychiatric/Behavioral: Negative for agitation and dysphoric mood.       Objective:    Physical Exam  Constitutional: He appears well-developed and well-nourished. No distress.  Neck: Neck supple. No thyromegaly present.  Cardiovascular: Normal rate and regular rhythm.     Pulmonary/Chest: Effort normal and breath sounds normal. No respiratory distress.  Abdominal: Soft. Bowel sounds are normal. There is no tenderness.  Musculoskeletal: He exhibits no edema or tenderness.  Some increased tenderness to palpation over his posterior shoulder - right.    Lymphadenopathy:    He has no cervical adenopathy.  Skin: No rash noted. No erythema.  Psychiatric: He has a normal mood and affect. His behavior is normal.    BP 118/72 (BP Location: Left Arm, Patient Position: Sitting)   Pulse (!) 52   Wt 147 lb (66.7 kg)   SpO2 99%   BMI 23.02 kg/m  Wt Readings from Last 3 Encounters:  10/18/15 147 lb (66.7 kg)  06/07/15 148 lb 12 oz (67.5 kg)  01/12/15 148 lb 13 oz (67.5 kg)     Lab Results  Component Value Date   WBC 5.9 06/08/2015   HGB 14.9 06/08/2015   HCT 30.0 (L) 06/08/2015   PLT 316 06/08/2015   GLUCOSE 72 10/18/2015   CHOL 279 (H) 10/18/2015   TRIG 166.0 (H) 10/18/2015   HDL 62.40 10/18/2015   LDLDIRECT 129.6 01/30/2013   LDLCALC 183 (H) 10/18/2015   ALT 21 10/18/2015   AST 19 10/18/2015   NA 138 10/18/2015   K 4.6 10/18/2015   CL 101 10/18/2015   CREATININE 1.18 10/18/2015   BUN 15 10/18/2015   CO2 31 10/18/2015   TSH 1.30 06/07/2015   PSA 0.04 (L) 10/18/2015    Mr Brain W Wo Contrast  Result Date: 03/03/2015 CLINICAL DATA:  CNS lymphoma.  Multiple sclerosis. EXAM: MRI HEAD WITHOUT AND WITH CONTRAST TECHNIQUE: Multiplanar, multiecho pulse sequences of the brain and surrounding structures were obtained without and with intravenous contrast. CONTRAST:  45mL MULTIHANCE GADOBENATE DIMEGLUMINE 529 MG/ML IV SOLN COMPARISON:  Head CT 11/15/2014 and MRI 06/08/2014 FINDINGS: There is no evidence of acute infarct, midline shift, or extra-axial fluid collection. Moderate cerebral atrophy is again seen. A focus of chronic microhemorrhage in the high posterior medial left frontal lobe is unchanged. Susceptibility artifacts/blood products along the medial  left parietal lobe are much less conspicuous. A tiny developmental venous anomaly in the high posterior medial right frontal lobe is unchanged. The residual enhancement in the medial left parietal lobe on the prior study has now completely resolved. There is unchanged, minimal residual cortical/ subcortical T2 hyperintensity in this region. No new enhancing brain lesions are identified. Foci of T2 hyperintensity elsewhere in the cerebral white matter bilaterally are unchanged, greatest in the periventricular regions and with involvement of the splenium of the corpus callosum, compatible with history of multiple sclerosis. Small focus of enhancement in the right parietal scalp is unchanged. Orbits are unremarkable on this nondedicated examination. Chronic left sphenoid sinusitis is noted. Mastoid air cells are clear. IMPRESSION: Complete resolution of enhancement in the medial left  parietal lobe with unchanged, minimal residual T2 hyperintensity/gliosis. No new intracranial abnormality. Electronically Signed   By: Logan Bores M.D.   On: 03/03/2015 14:47       Assessment & Plan:   Problem List Items Addressed This Visit    B12 deficiency    Has been receiving B12 injections intermittently.  Hard to get here.  Check B12 level.  Start oral B12 if level ok.       Relevant Orders   Vitamin B12 (Completed)   CA of prostate ([place])    Will check psa today.  Schedule f/u with Dr [name].        Hypercholesteremia    Was on zetia.  Off now.  Low cholesterol diet and exercise.  Check lipid panel.  Unable to take statins.        Relevant Orders   Lipid panel (Completed)   Hepatic function panel (Completed)   Hypertension - Primary    Blood pressure under good control.  Continue same medication regimen.  Follow pressures.  Follow metabolic panel.        Relevant Orders   Basic metabolic panel (Completed)   Malignant neoplasm of prostate ([place])   Relevant Orders   PSA, Medicare (Completed)   Multiple  sclerosis ([place])    Seeing Dr Manuella Ghazi.  Has caretakers coming in the home.  See neurology's note.         Other Visit Diagnoses   None.      Einar Pheasant, MD

## 2015-10-22 ENCOUNTER — Encounter: Payer: Self-pay | Admitting: Internal Medicine

## 2015-10-22 NOTE — Assessment & Plan Note (Signed)
Was on zetia.  Off now.  Low cholesterol diet and exercise.  Check lipid panel.  Unable to take statins.

## 2015-10-22 NOTE — Assessment & Plan Note (Signed)
Has been receiving B12 injections intermittently.  Hard to get here.  Check B12 level.  Start oral B12 if level ok.

## 2015-10-22 NOTE — Assessment & Plan Note (Signed)
Will check psa today.  Schedule f/u with Dr [name].

## 2015-10-22 NOTE — Assessment & Plan Note (Signed)
Seeing Dr Manuella Ghazi.  Has caretakers coming in the home.  See neurology's note.

## 2015-10-22 NOTE — Assessment & Plan Note (Signed)
Blood pressure under good control.  Continue same medication regimen.  Follow pressures.  Follow metabolic panel.   

## 2015-10-25 ENCOUNTER — Other Ambulatory Visit: Payer: Self-pay | Admitting: *Deleted

## 2015-10-25 ENCOUNTER — Telehealth: Payer: Self-pay | Admitting: *Deleted

## 2015-10-25 MED ORDER — VITAMIN B-12 1000 MCG PO TABS: 1000.0000 ug | ORAL_TABLET | Freq: Every day | ORAL | 3 refills | Status: AC

## 2015-10-25 MED ORDER — EZETIMIBE 10 MG PO TABS: 10.0000 mg | ORAL_TABLET | Freq: Every day | ORAL | 3 refills | Status: DC

## 2015-10-25 NOTE — Telephone Encounter (Signed)
Medications sent to pharmacy

## 2015-10-25 NOTE — Telephone Encounter (Signed)
Patient has requested to have his Zetia to be sent to [facility], due to cost.

## 2015-10-26 MED ORDER — EZETIMIBE 10 MG PO TABS: 10.0000 mg | ORAL_TABLET | Freq: Every day | ORAL | 3 refills | Status: DC

## 2015-10-26 NOTE — Telephone Encounter (Signed)
rx sent thanks 

## 2015-10-28 ENCOUNTER — Telehealth: Payer: Self-pay | Admitting: *Deleted

## 2015-10-28 MED ORDER — CYANOCOBALAMIN 1000 MCG/ML IJ SOLN: 1000.0000 ug | Freq: Once | INTRAMUSCULAR | 1 refills | Status: AC

## 2015-10-28 NOTE — Telephone Encounter (Signed)
Patient case manager called on behalf of patient and states that a RX was called in for vitamin b 12 and it was sent to [facility].  That Rx needs to be sent to [facility] mail order instead. Thanks

## 2015-10-28 NOTE — Telephone Encounter (Signed)
rx resent to [facility]

## 2015-12-20 DIAGNOSIS — G35 Multiple sclerosis: Secondary | ICD-10-CM | POA: Diagnosis not present

## 2015-12-20 DIAGNOSIS — N3281 Overactive bladder: Secondary | ICD-10-CM | POA: Diagnosis not present

## 2015-12-20 DIAGNOSIS — Z6820 Body mass index (BMI) 20.0-20.9, adult: Secondary | ICD-10-CM | POA: Diagnosis not present

## 2015-12-20 DIAGNOSIS — N320 Bladder-neck obstruction: Secondary | ICD-10-CM | POA: Diagnosis not present

## 2015-12-20 DIAGNOSIS — R339 Retention of urine, unspecified: Secondary | ICD-10-CM | POA: Diagnosis not present

## 2015-12-20 DIAGNOSIS — C61 Malignant neoplasm of prostate: Secondary | ICD-10-CM | POA: Diagnosis not present

## 2016-01-02 DIAGNOSIS — G35 Multiple sclerosis: Secondary | ICD-10-CM | POA: Diagnosis not present

## 2016-01-02 DIAGNOSIS — R296 Repeated falls: Secondary | ICD-10-CM | POA: Diagnosis not present

## 2016-01-02 DIAGNOSIS — Z23 Encounter for immunization: Secondary | ICD-10-CM | POA: Diagnosis not present

## 2016-01-19 DIAGNOSIS — I10 Essential (primary) hypertension: Secondary | ICD-10-CM | POA: Diagnosis not present

## 2016-01-19 DIAGNOSIS — R413 Other amnesia: Secondary | ICD-10-CM | POA: Diagnosis not present

## 2016-01-19 DIAGNOSIS — G35 Multiple sclerosis: Secondary | ICD-10-CM | POA: Diagnosis not present

## 2016-01-19 DIAGNOSIS — C61 Malignant neoplasm of prostate: Secondary | ICD-10-CM | POA: Diagnosis not present

## 2016-01-19 DIAGNOSIS — Z9181 History of falling: Secondary | ICD-10-CM | POA: Diagnosis not present

## 2016-01-24 DIAGNOSIS — R413 Other amnesia: Secondary | ICD-10-CM | POA: Diagnosis not present

## 2016-01-24 DIAGNOSIS — Z9181 History of falling: Secondary | ICD-10-CM | POA: Diagnosis not present

## 2016-01-24 DIAGNOSIS — I10 Essential (primary) hypertension: Secondary | ICD-10-CM | POA: Diagnosis not present

## 2016-01-24 DIAGNOSIS — C61 Malignant neoplasm of prostate: Secondary | ICD-10-CM | POA: Diagnosis not present

## 2016-01-24 DIAGNOSIS — G35 Multiple sclerosis: Secondary | ICD-10-CM | POA: Diagnosis not present

## 2016-01-26 DIAGNOSIS — R413 Other amnesia: Secondary | ICD-10-CM | POA: Diagnosis not present

## 2016-01-26 DIAGNOSIS — C61 Malignant neoplasm of prostate: Secondary | ICD-10-CM | POA: Diagnosis not present

## 2016-01-26 DIAGNOSIS — G35 Multiple sclerosis: Secondary | ICD-10-CM | POA: Diagnosis not present

## 2016-01-26 DIAGNOSIS — I10 Essential (primary) hypertension: Secondary | ICD-10-CM | POA: Diagnosis not present

## 2016-01-26 DIAGNOSIS — Z9181 History of falling: Secondary | ICD-10-CM | POA: Diagnosis not present

## 2016-01-31 DIAGNOSIS — Z9181 History of falling: Secondary | ICD-10-CM | POA: Diagnosis not present

## 2016-01-31 DIAGNOSIS — R413 Other amnesia: Secondary | ICD-10-CM | POA: Diagnosis not present

## 2016-01-31 DIAGNOSIS — I10 Essential (primary) hypertension: Secondary | ICD-10-CM | POA: Diagnosis not present

## 2016-01-31 DIAGNOSIS — G35 Multiple sclerosis: Secondary | ICD-10-CM | POA: Diagnosis not present

## 2016-01-31 DIAGNOSIS — C61 Malignant neoplasm of prostate: Secondary | ICD-10-CM | POA: Diagnosis not present

## 2016-02-02 DIAGNOSIS — C61 Malignant neoplasm of prostate: Secondary | ICD-10-CM | POA: Diagnosis not present

## 2016-02-02 DIAGNOSIS — Z9181 History of falling: Secondary | ICD-10-CM | POA: Diagnosis not present

## 2016-02-02 DIAGNOSIS — I10 Essential (primary) hypertension: Secondary | ICD-10-CM | POA: Diagnosis not present

## 2016-02-02 DIAGNOSIS — G35 Multiple sclerosis: Secondary | ICD-10-CM | POA: Diagnosis not present

## 2016-02-02 DIAGNOSIS — R413 Other amnesia: Secondary | ICD-10-CM | POA: Diagnosis not present

## 2016-02-07 DIAGNOSIS — C61 Malignant neoplasm of prostate: Secondary | ICD-10-CM | POA: Diagnosis not present

## 2016-02-07 DIAGNOSIS — R413 Other amnesia: Secondary | ICD-10-CM | POA: Diagnosis not present

## 2016-02-07 DIAGNOSIS — I10 Essential (primary) hypertension: Secondary | ICD-10-CM | POA: Diagnosis not present

## 2016-02-07 DIAGNOSIS — G35 Multiple sclerosis: Secondary | ICD-10-CM | POA: Diagnosis not present

## 2016-02-07 DIAGNOSIS — Z9181 History of falling: Secondary | ICD-10-CM | POA: Diagnosis not present

## 2016-02-09 DIAGNOSIS — C61 Malignant neoplasm of prostate: Secondary | ICD-10-CM | POA: Diagnosis not present

## 2016-02-09 DIAGNOSIS — I10 Essential (primary) hypertension: Secondary | ICD-10-CM | POA: Diagnosis not present

## 2016-02-09 DIAGNOSIS — Z9181 History of falling: Secondary | ICD-10-CM | POA: Diagnosis not present

## 2016-02-09 DIAGNOSIS — R413 Other amnesia: Secondary | ICD-10-CM | POA: Diagnosis not present

## 2016-02-09 DIAGNOSIS — G35 Multiple sclerosis: Secondary | ICD-10-CM | POA: Diagnosis not present

## 2016-02-14 DIAGNOSIS — Z9181 History of falling: Secondary | ICD-10-CM | POA: Diagnosis not present

## 2016-02-14 DIAGNOSIS — I10 Essential (primary) hypertension: Secondary | ICD-10-CM | POA: Diagnosis not present

## 2016-02-14 DIAGNOSIS — R413 Other amnesia: Secondary | ICD-10-CM | POA: Diagnosis not present

## 2016-02-14 DIAGNOSIS — G35 Multiple sclerosis: Secondary | ICD-10-CM | POA: Diagnosis not present

## 2016-02-14 DIAGNOSIS — C61 Malignant neoplasm of prostate: Secondary | ICD-10-CM | POA: Diagnosis not present

## 2016-02-15 DIAGNOSIS — C61 Malignant neoplasm of prostate: Secondary | ICD-10-CM | POA: Diagnosis not present

## 2016-02-15 DIAGNOSIS — R413 Other amnesia: Secondary | ICD-10-CM | POA: Diagnosis not present

## 2016-02-15 DIAGNOSIS — Z9181 History of falling: Secondary | ICD-10-CM | POA: Diagnosis not present

## 2016-02-15 DIAGNOSIS — G35 Multiple sclerosis: Secondary | ICD-10-CM | POA: Diagnosis not present

## 2016-02-15 DIAGNOSIS — I10 Essential (primary) hypertension: Secondary | ICD-10-CM | POA: Diagnosis not present

## 2016-02-21 ENCOUNTER — Telehealth: Payer: Self-pay | Admitting: Internal Medicine

## 2016-02-21 DIAGNOSIS — I10 Essential (primary) hypertension: Secondary | ICD-10-CM | POA: Diagnosis not present

## 2016-02-21 DIAGNOSIS — G35 Multiple sclerosis: Secondary | ICD-10-CM | POA: Diagnosis not present

## 2016-02-21 DIAGNOSIS — R413 Other amnesia: Secondary | ICD-10-CM | POA: Diagnosis not present

## 2016-02-21 DIAGNOSIS — C61 Malignant neoplasm of prostate: Secondary | ICD-10-CM | POA: Diagnosis not present

## 2016-02-21 DIAGNOSIS — Z9181 History of falling: Secondary | ICD-10-CM | POA: Diagnosis not present

## 2016-02-21 NOTE — Telephone Encounter (Signed)
Pt states it is hard for him to get to the AWV appt. Thank you!

## 2016-02-23 DIAGNOSIS — Z9181 History of falling: Secondary | ICD-10-CM | POA: Diagnosis not present

## 2016-02-23 DIAGNOSIS — G35 Multiple sclerosis: Secondary | ICD-10-CM | POA: Diagnosis not present

## 2016-02-23 DIAGNOSIS — C61 Malignant neoplasm of prostate: Secondary | ICD-10-CM | POA: Diagnosis not present

## 2016-02-23 DIAGNOSIS — R413 Other amnesia: Secondary | ICD-10-CM | POA: Diagnosis not present

## 2016-02-23 DIAGNOSIS — I10 Essential (primary) hypertension: Secondary | ICD-10-CM | POA: Diagnosis not present

## 2016-03-01 ENCOUNTER — Other Ambulatory Visit: Payer: Self-pay

## 2016-03-28 ENCOUNTER — Other Ambulatory Visit: Payer: Self-pay | Admitting: Surgical

## 2016-03-28 MED ORDER — FINASTERIDE 5 MG PO TABS: 5.0000 mg | ORAL_TABLET | Freq: Every day | ORAL | 0 refills | Status: DC

## 2016-04-19 ENCOUNTER — Ambulatory Visit: Payer: Medicare Other | Admitting: Internal Medicine

## 2016-05-30 IMAGING — CT CT MAXILLOFACIAL W/O CM
3 of 5 series · 15 of 47 positions shown, 18 images · non-contrast
Comparison: MR brain dated 06/08/2014

CLINICAL DATA: Fall 1 week ago, swelling lower right mandible

EXAM:
CT HEAD WITHOUT CONTRAST
CT MAXILLOFACIAL WITHOUT CONTRAST
TECHNIQUE: Multidetector CT imaging of the head and maxillofacial structures
were performed using the standard protocol without intravenous
contrast. Multiplanar CT image reconstructions of the maxillofacial
structures were also generated.

[Series 5: head bone · axial · 0.44mm/px · z∈[-82,+56]mm · 9 of 72 slices shown, 12 images]
[im 8/72  brain]
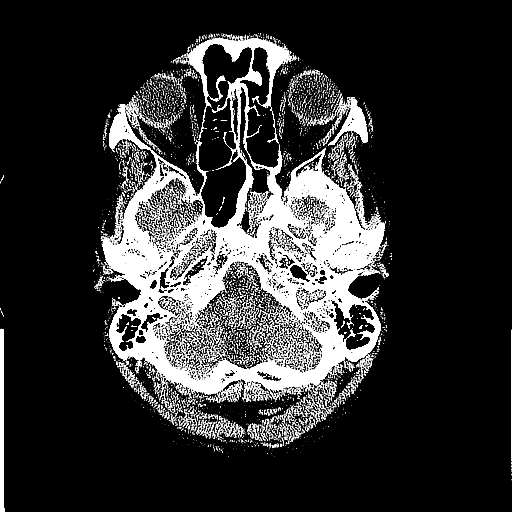
[im 8/72  bone]
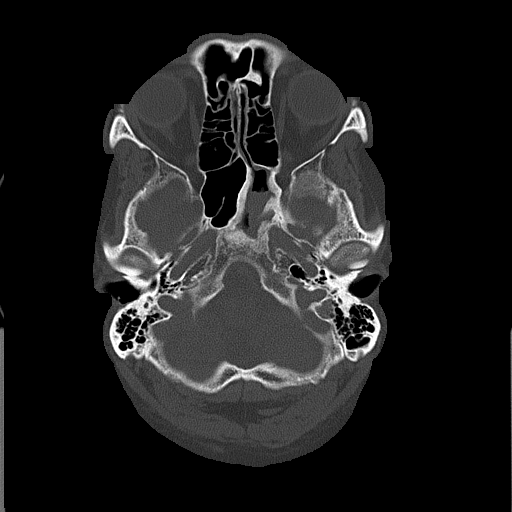
[im 15/72  bone]
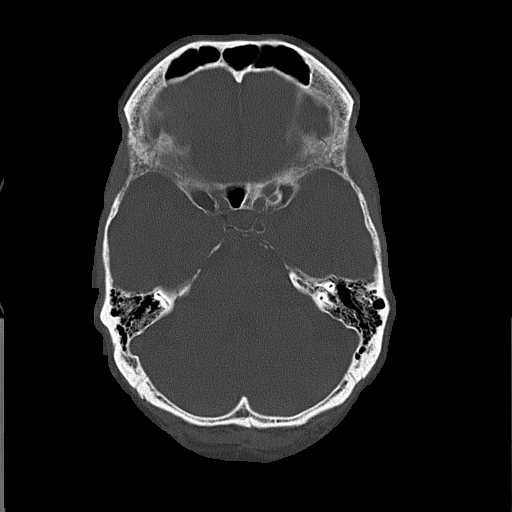
[im 22/72  bone]
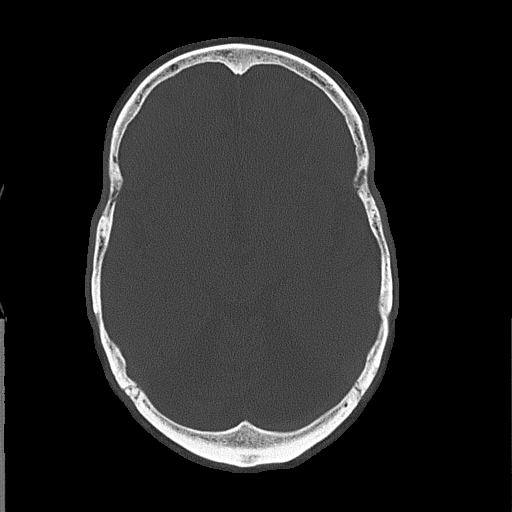
[im 29/72  bone]
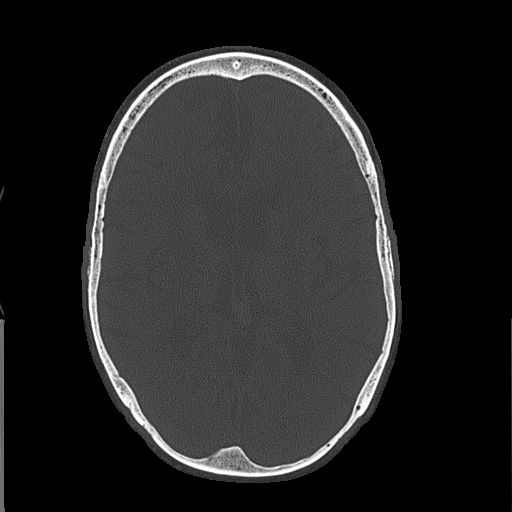
[im 36/72  brain]
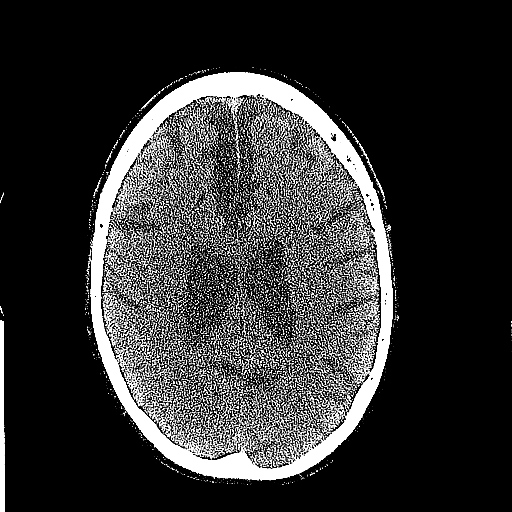
[im 36/72  bone]
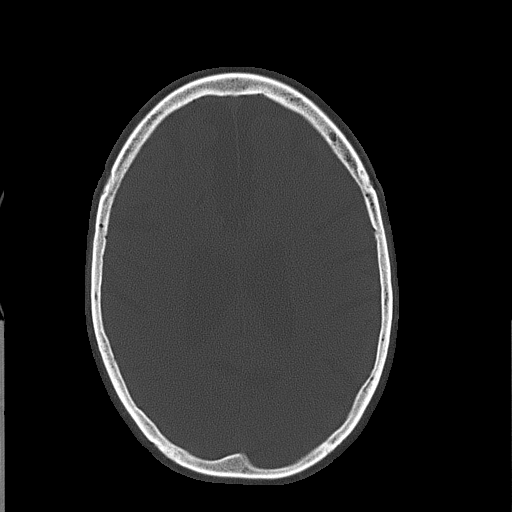
[im 43/72  bone]
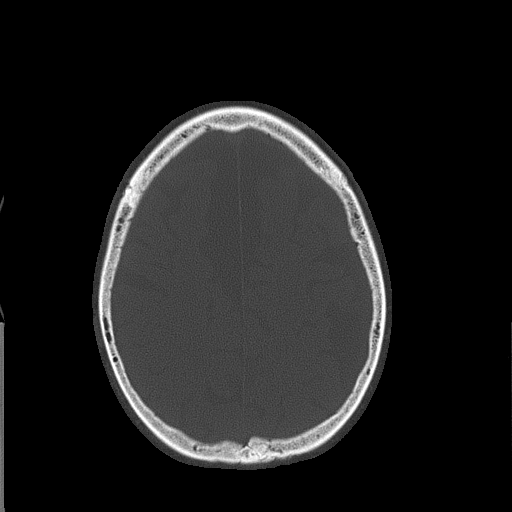
[im 50/72  bone]
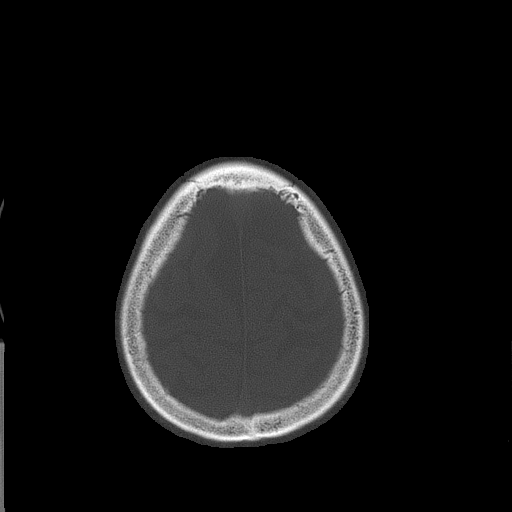
[im 57/72  bone]
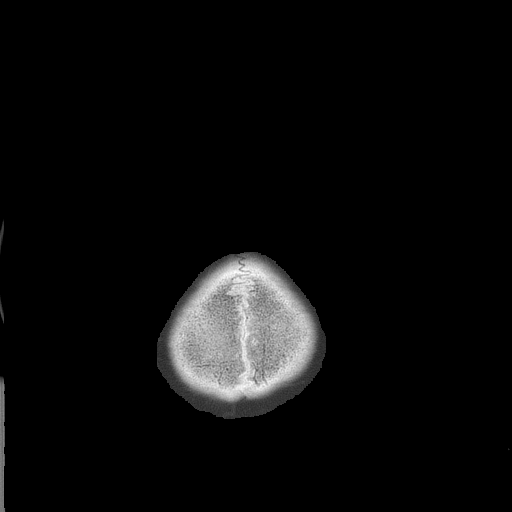
[im 64/72  brain]
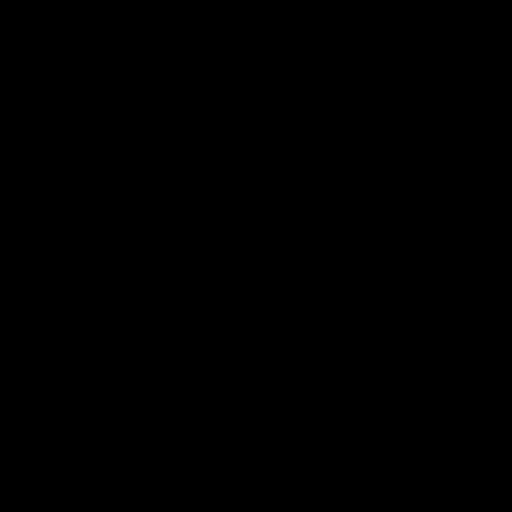
[im 64/72  bone]
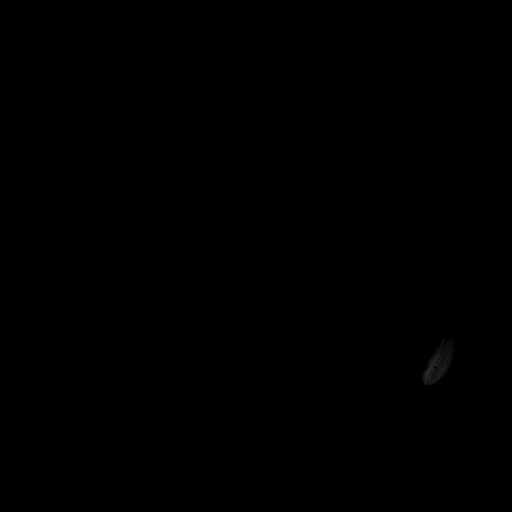

[Series 6: coronal soft · coronal · 0.34mm/px · 3 of 84 slices shown]
[im 28/84  bone]
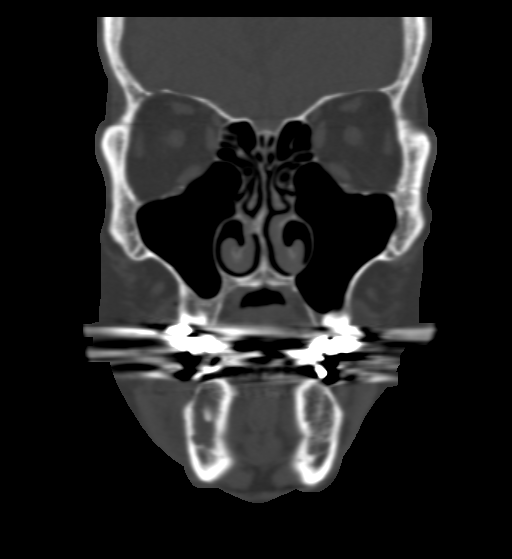
[im 37/84  bone]
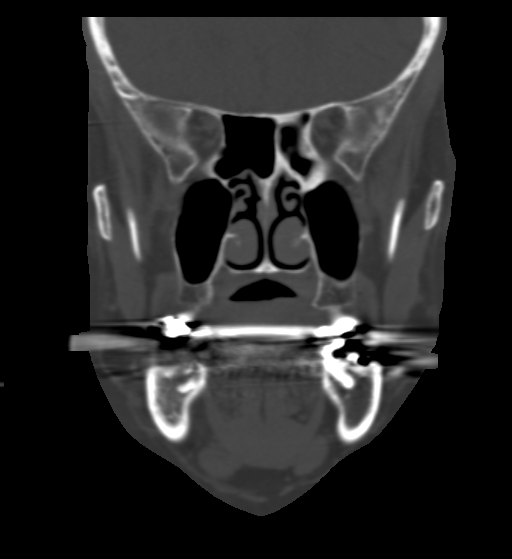
[im 47/84  bone]
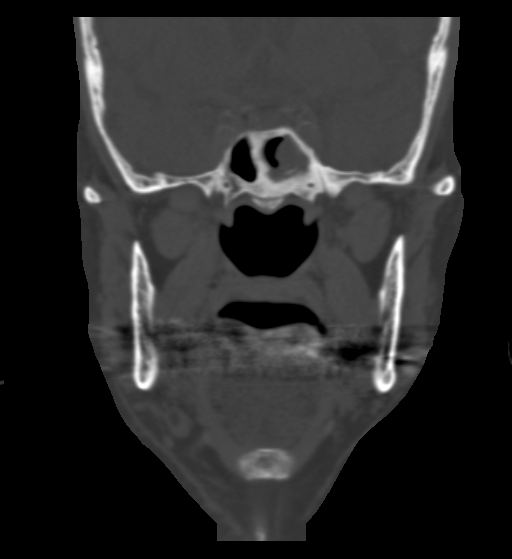

[Series 7: sagittal soft · sagittal · 0.36mm/px · 3 of 74 slices shown]
[im 25/74  bone]
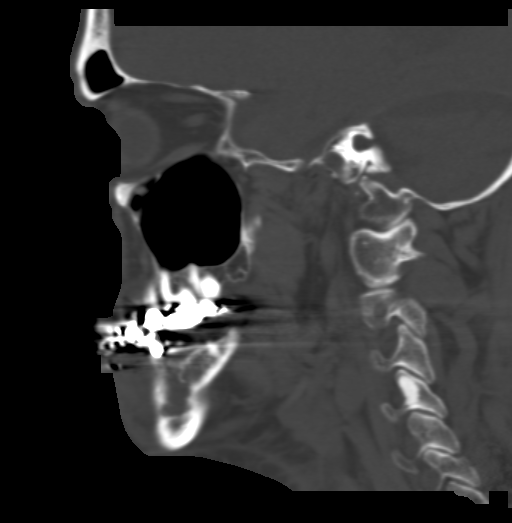
[im 37/74  bone]
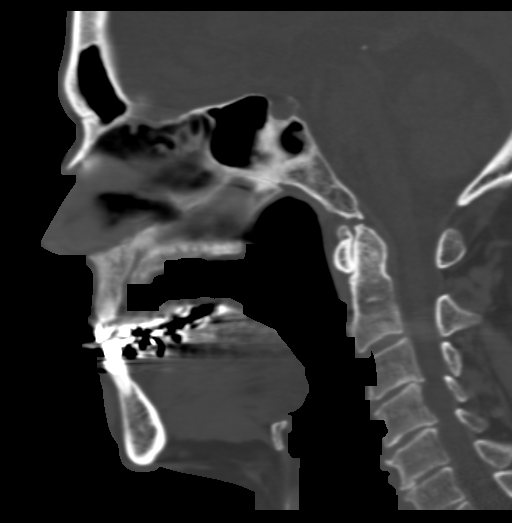
[im 49/74  bone]
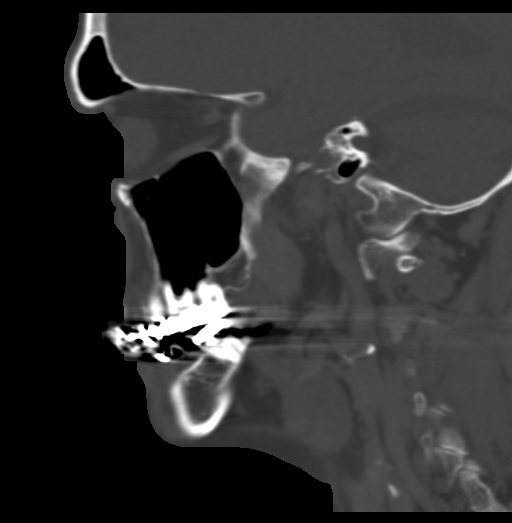

[15 of 47 positions shown; findings below may reference images not displayed]

FINDINGS: CT HEAD FINDINGS

No evidence of parenchymal hemorrhage or extra-axial fluid
collection. No mass lesion, mass effect, or midline shift.No CT
evidence of acute infarction.

Old bilateral basal ganglia lacunar infarcts.

Subcortical white matter and periventricular small vessel ischemic
changes.

Partial opacification of the left sphenoid sinus with associated
chronic mucosal thickening. Visualized paranasal sinuses are
otherwise clear. Bilateral mastoid air cells are clear.

Global cortical atrophy.  No ventriculomegaly.

No evidence of calvarial fracture.

CT MAXILLOFACIAL FINDINGS

Partial opacification of the left maxillary sinus with associated
chronic mucosal thickening. Visualized paranasal sinuses and mastoid
air cells are otherwise clear.

The bilateral orbits, including the globes and retroconal soft
tissues, are within normal limits.

No evidence of maxillofacial fracture.

The mandible is intact. Bilateral mandibular condyles are
well-seated in the TMJs.

Apical lucency/root abscess associated with the right lower 2nd
bicuspid (series 4/image 33; tooth #29). Overlying cortical
disruption along the lateral margin of the mandible.

Associated 8 x 19 mm odontogenic abscess (series 3/image 31).
Overlying soft tissue swelling/cellulitis.

Degenerative changes of the cervical spine to the bottom of C6.
IMPRESSION: Apical lucency/root abscess associated with the right lower 2nd
bicuspid (tooth #29). Associated 8 x 19 mm odontogenic abscess with
overlying soft tissue swelling/cellulitis.

No evidence of maxillofacial fracture. Chronic left maxillary sinus
disease.

No evidence of acute intracranial abnormality. Atrophy with small
vessel ischemic changes and old bilateral basal ganglia lacunar
infarcts.

## 2016-06-19 ENCOUNTER — Telehealth: Payer: Self-pay | Admitting: Internal Medicine

## 2016-06-19 NOTE — Telephone Encounter (Signed)
Pharmacy called requesting a refill on pt's finasteride (PROSCAR) 5 MG tablet. Please advise, thank you!  [place], [place]

## 2016-06-20 MED ORDER — FINASTERIDE 5 MG PO TABS: 5.0000 mg | ORAL_TABLET | Freq: Every day | ORAL | 0 refills | Status: DC

## 2016-06-28 ENCOUNTER — Ambulatory Visit: Payer: Medicare Other | Admitting: Internal Medicine

## 2016-07-17 DIAGNOSIS — G35 Multiple sclerosis: Secondary | ICD-10-CM | POA: Diagnosis not present

## 2016-07-17 DIAGNOSIS — G3184 Mild cognitive impairment, so stated: Secondary | ICD-10-CM | POA: Diagnosis not present

## 2016-07-19 DIAGNOSIS — I10 Essential (primary) hypertension: Secondary | ICD-10-CM | POA: Diagnosis not present

## 2016-07-19 DIAGNOSIS — F801 Expressive language disorder: Secondary | ICD-10-CM | POA: Diagnosis not present

## 2016-07-19 DIAGNOSIS — R498 Other voice and resonance disorders: Secondary | ICD-10-CM | POA: Diagnosis not present

## 2016-07-19 DIAGNOSIS — M6281 Muscle weakness (generalized): Secondary | ICD-10-CM | POA: Diagnosis not present

## 2016-07-19 DIAGNOSIS — G35 Multiple sclerosis: Secondary | ICD-10-CM | POA: Diagnosis not present

## 2016-07-19 DIAGNOSIS — R471 Dysarthria and anarthria: Secondary | ICD-10-CM | POA: Diagnosis not present

## 2016-07-24 ENCOUNTER — Other Ambulatory Visit: Payer: Self-pay

## 2016-07-24 DIAGNOSIS — R471 Dysarthria and anarthria: Secondary | ICD-10-CM | POA: Diagnosis not present

## 2016-07-24 DIAGNOSIS — R498 Other voice and resonance disorders: Secondary | ICD-10-CM | POA: Diagnosis not present

## 2016-07-24 DIAGNOSIS — G35 Multiple sclerosis: Secondary | ICD-10-CM | POA: Diagnosis not present

## 2016-07-24 DIAGNOSIS — F801 Expressive language disorder: Secondary | ICD-10-CM | POA: Diagnosis not present

## 2016-07-24 DIAGNOSIS — I10 Essential (primary) hypertension: Secondary | ICD-10-CM | POA: Diagnosis not present

## 2016-07-24 DIAGNOSIS — M6281 Muscle weakness (generalized): Secondary | ICD-10-CM | POA: Diagnosis not present

## 2016-07-24 MED ORDER — HYDROCHLOROTHIAZIDE 12.5 MG PO CAPS: ORAL_CAPSULE | ORAL | 0 refills | Status: DC

## 2016-07-24 MED ORDER — AMLODIPINE BESYLATE 5 MG PO TABS: 5.0000 mg | ORAL_TABLET | Freq: Every day | ORAL | 0 refills | Status: DC

## 2016-07-26 DIAGNOSIS — F801 Expressive language disorder: Secondary | ICD-10-CM | POA: Diagnosis not present

## 2016-07-26 DIAGNOSIS — R498 Other voice and resonance disorders: Secondary | ICD-10-CM | POA: Diagnosis not present

## 2016-07-26 DIAGNOSIS — I10 Essential (primary) hypertension: Secondary | ICD-10-CM | POA: Diagnosis not present

## 2016-07-26 DIAGNOSIS — G35 Multiple sclerosis: Secondary | ICD-10-CM | POA: Diagnosis not present

## 2016-07-26 DIAGNOSIS — M6281 Muscle weakness (generalized): Secondary | ICD-10-CM | POA: Diagnosis not present

## 2016-07-26 DIAGNOSIS — R471 Dysarthria and anarthria: Secondary | ICD-10-CM | POA: Diagnosis not present

## 2016-07-27 DIAGNOSIS — I10 Essential (primary) hypertension: Secondary | ICD-10-CM | POA: Diagnosis not present

## 2016-07-27 DIAGNOSIS — R498 Other voice and resonance disorders: Secondary | ICD-10-CM | POA: Diagnosis not present

## 2016-07-27 DIAGNOSIS — G35 Multiple sclerosis: Secondary | ICD-10-CM | POA: Diagnosis not present

## 2016-07-27 DIAGNOSIS — R471 Dysarthria and anarthria: Secondary | ICD-10-CM | POA: Diagnosis not present

## 2016-07-27 DIAGNOSIS — F801 Expressive language disorder: Secondary | ICD-10-CM | POA: Diagnosis not present

## 2016-07-27 DIAGNOSIS — M6281 Muscle weakness (generalized): Secondary | ICD-10-CM | POA: Diagnosis not present

## 2016-07-31 DIAGNOSIS — F801 Expressive language disorder: Secondary | ICD-10-CM | POA: Diagnosis not present

## 2016-07-31 DIAGNOSIS — R471 Dysarthria and anarthria: Secondary | ICD-10-CM | POA: Diagnosis not present

## 2016-07-31 DIAGNOSIS — I10 Essential (primary) hypertension: Secondary | ICD-10-CM | POA: Diagnosis not present

## 2016-07-31 DIAGNOSIS — R498 Other voice and resonance disorders: Secondary | ICD-10-CM | POA: Diagnosis not present

## 2016-07-31 DIAGNOSIS — G35 Multiple sclerosis: Secondary | ICD-10-CM | POA: Diagnosis not present

## 2016-07-31 DIAGNOSIS — M6281 Muscle weakness (generalized): Secondary | ICD-10-CM | POA: Diagnosis not present

## 2016-08-01 DIAGNOSIS — R471 Dysarthria and anarthria: Secondary | ICD-10-CM | POA: Diagnosis not present

## 2016-08-01 DIAGNOSIS — R498 Other voice and resonance disorders: Secondary | ICD-10-CM | POA: Diagnosis not present

## 2016-08-01 DIAGNOSIS — M6281 Muscle weakness (generalized): Secondary | ICD-10-CM | POA: Diagnosis not present

## 2016-08-01 DIAGNOSIS — F801 Expressive language disorder: Secondary | ICD-10-CM | POA: Diagnosis not present

## 2016-08-01 DIAGNOSIS — G35 Multiple sclerosis: Secondary | ICD-10-CM | POA: Diagnosis not present

## 2016-08-01 DIAGNOSIS — I10 Essential (primary) hypertension: Secondary | ICD-10-CM | POA: Diagnosis not present

## 2016-08-02 DIAGNOSIS — I10 Essential (primary) hypertension: Secondary | ICD-10-CM | POA: Diagnosis not present

## 2016-08-02 DIAGNOSIS — R471 Dysarthria and anarthria: Secondary | ICD-10-CM | POA: Diagnosis not present

## 2016-08-02 DIAGNOSIS — R498 Other voice and resonance disorders: Secondary | ICD-10-CM | POA: Diagnosis not present

## 2016-08-02 DIAGNOSIS — F801 Expressive language disorder: Secondary | ICD-10-CM | POA: Diagnosis not present

## 2016-08-02 DIAGNOSIS — M6281 Muscle weakness (generalized): Secondary | ICD-10-CM | POA: Diagnosis not present

## 2016-08-02 DIAGNOSIS — G35 Multiple sclerosis: Secondary | ICD-10-CM | POA: Diagnosis not present

## 2016-08-06 ENCOUNTER — Other Ambulatory Visit: Payer: Self-pay

## 2016-08-06 DIAGNOSIS — I10 Essential (primary) hypertension: Secondary | ICD-10-CM | POA: Diagnosis not present

## 2016-08-06 DIAGNOSIS — R471 Dysarthria and anarthria: Secondary | ICD-10-CM | POA: Diagnosis not present

## 2016-08-06 DIAGNOSIS — G35 Multiple sclerosis: Secondary | ICD-10-CM | POA: Diagnosis not present

## 2016-08-06 DIAGNOSIS — F801 Expressive language disorder: Secondary | ICD-10-CM | POA: Diagnosis not present

## 2016-08-06 DIAGNOSIS — R498 Other voice and resonance disorders: Secondary | ICD-10-CM | POA: Diagnosis not present

## 2016-08-06 DIAGNOSIS — M6281 Muscle weakness (generalized): Secondary | ICD-10-CM | POA: Diagnosis not present

## 2016-08-06 MED ORDER — EZETIMIBE 10 MG PO TABS: 10.0000 mg | ORAL_TABLET | Freq: Every day | ORAL | 0 refills | Status: DC

## 2016-08-08 DIAGNOSIS — R471 Dysarthria and anarthria: Secondary | ICD-10-CM | POA: Diagnosis not present

## 2016-08-08 DIAGNOSIS — M6281 Muscle weakness (generalized): Secondary | ICD-10-CM | POA: Diagnosis not present

## 2016-08-08 DIAGNOSIS — R498 Other voice and resonance disorders: Secondary | ICD-10-CM | POA: Diagnosis not present

## 2016-08-08 DIAGNOSIS — G35 Multiple sclerosis: Secondary | ICD-10-CM | POA: Diagnosis not present

## 2016-08-08 DIAGNOSIS — F801 Expressive language disorder: Secondary | ICD-10-CM | POA: Diagnosis not present

## 2016-08-08 DIAGNOSIS — I10 Essential (primary) hypertension: Secondary | ICD-10-CM | POA: Diagnosis not present

## 2016-08-09 DIAGNOSIS — R498 Other voice and resonance disorders: Secondary | ICD-10-CM | POA: Diagnosis not present

## 2016-08-09 DIAGNOSIS — G35 Multiple sclerosis: Secondary | ICD-10-CM | POA: Diagnosis not present

## 2016-08-09 DIAGNOSIS — R471 Dysarthria and anarthria: Secondary | ICD-10-CM | POA: Diagnosis not present

## 2016-08-09 DIAGNOSIS — F801 Expressive language disorder: Secondary | ICD-10-CM | POA: Diagnosis not present

## 2016-08-09 DIAGNOSIS — I10 Essential (primary) hypertension: Secondary | ICD-10-CM | POA: Diagnosis not present

## 2016-08-09 DIAGNOSIS — M6281 Muscle weakness (generalized): Secondary | ICD-10-CM | POA: Diagnosis not present

## 2016-08-10 DIAGNOSIS — G35 Multiple sclerosis: Secondary | ICD-10-CM | POA: Diagnosis not present

## 2016-08-10 DIAGNOSIS — F801 Expressive language disorder: Secondary | ICD-10-CM | POA: Diagnosis not present

## 2016-08-10 DIAGNOSIS — I10 Essential (primary) hypertension: Secondary | ICD-10-CM | POA: Diagnosis not present

## 2016-08-10 DIAGNOSIS — M6281 Muscle weakness (generalized): Secondary | ICD-10-CM | POA: Diagnosis not present

## 2016-08-10 DIAGNOSIS — R471 Dysarthria and anarthria: Secondary | ICD-10-CM | POA: Diagnosis not present

## 2016-08-10 DIAGNOSIS — R498 Other voice and resonance disorders: Secondary | ICD-10-CM | POA: Diagnosis not present

## 2016-08-14 DIAGNOSIS — G35 Multiple sclerosis: Secondary | ICD-10-CM | POA: Diagnosis not present

## 2016-08-14 DIAGNOSIS — F801 Expressive language disorder: Secondary | ICD-10-CM | POA: Diagnosis not present

## 2016-08-14 DIAGNOSIS — M6281 Muscle weakness (generalized): Secondary | ICD-10-CM | POA: Diagnosis not present

## 2016-08-14 DIAGNOSIS — R498 Other voice and resonance disorders: Secondary | ICD-10-CM | POA: Diagnosis not present

## 2016-08-14 DIAGNOSIS — I10 Essential (primary) hypertension: Secondary | ICD-10-CM | POA: Diagnosis not present

## 2016-08-14 DIAGNOSIS — R471 Dysarthria and anarthria: Secondary | ICD-10-CM | POA: Diagnosis not present

## 2016-08-16 DIAGNOSIS — G35 Multiple sclerosis: Secondary | ICD-10-CM | POA: Diagnosis not present

## 2016-08-16 DIAGNOSIS — F801 Expressive language disorder: Secondary | ICD-10-CM | POA: Diagnosis not present

## 2016-08-16 DIAGNOSIS — R471 Dysarthria and anarthria: Secondary | ICD-10-CM | POA: Diagnosis not present

## 2016-08-16 DIAGNOSIS — I10 Essential (primary) hypertension: Secondary | ICD-10-CM | POA: Diagnosis not present

## 2016-08-16 DIAGNOSIS — R498 Other voice and resonance disorders: Secondary | ICD-10-CM | POA: Diagnosis not present

## 2016-08-16 DIAGNOSIS — M6281 Muscle weakness (generalized): Secondary | ICD-10-CM | POA: Diagnosis not present

## 2016-08-21 DIAGNOSIS — G35 Multiple sclerosis: Secondary | ICD-10-CM | POA: Diagnosis not present

## 2016-08-21 DIAGNOSIS — M6281 Muscle weakness (generalized): Secondary | ICD-10-CM | POA: Diagnosis not present

## 2016-08-21 DIAGNOSIS — F801 Expressive language disorder: Secondary | ICD-10-CM | POA: Diagnosis not present

## 2016-08-21 DIAGNOSIS — R498 Other voice and resonance disorders: Secondary | ICD-10-CM | POA: Diagnosis not present

## 2016-08-21 DIAGNOSIS — R471 Dysarthria and anarthria: Secondary | ICD-10-CM | POA: Diagnosis not present

## 2016-08-21 DIAGNOSIS — I10 Essential (primary) hypertension: Secondary | ICD-10-CM | POA: Diagnosis not present

## 2016-08-23 DIAGNOSIS — R498 Other voice and resonance disorders: Secondary | ICD-10-CM | POA: Diagnosis not present

## 2016-08-23 DIAGNOSIS — G35 Multiple sclerosis: Secondary | ICD-10-CM | POA: Diagnosis not present

## 2016-08-23 DIAGNOSIS — I10 Essential (primary) hypertension: Secondary | ICD-10-CM | POA: Diagnosis not present

## 2016-08-23 DIAGNOSIS — R471 Dysarthria and anarthria: Secondary | ICD-10-CM | POA: Diagnosis not present

## 2016-08-23 DIAGNOSIS — F801 Expressive language disorder: Secondary | ICD-10-CM | POA: Diagnosis not present

## 2016-08-23 DIAGNOSIS — M6281 Muscle weakness (generalized): Secondary | ICD-10-CM | POA: Diagnosis not present

## 2016-08-29 ENCOUNTER — Telehealth: Payer: Self-pay

## 2016-08-29 DIAGNOSIS — R471 Dysarthria and anarthria: Secondary | ICD-10-CM | POA: Diagnosis not present

## 2016-08-29 DIAGNOSIS — I10 Essential (primary) hypertension: Secondary | ICD-10-CM | POA: Diagnosis not present

## 2016-08-29 DIAGNOSIS — G35 Multiple sclerosis: Secondary | ICD-10-CM | POA: Diagnosis not present

## 2016-08-29 DIAGNOSIS — R498 Other voice and resonance disorders: Secondary | ICD-10-CM | POA: Diagnosis not present

## 2016-08-29 DIAGNOSIS — M6281 Muscle weakness (generalized): Secondary | ICD-10-CM | POA: Diagnosis not present

## 2016-08-29 DIAGNOSIS — F801 Expressive language disorder: Secondary | ICD-10-CM | POA: Diagnosis not present

## 2016-08-29 NOTE — Telephone Encounter (Signed)
Left a message to call Philip Gordon and schedule AWV.  Ok to schedule on 09/03/16.

## 2016-08-31 DIAGNOSIS — I10 Essential (primary) hypertension: Secondary | ICD-10-CM | POA: Diagnosis not present

## 2016-08-31 DIAGNOSIS — G35 Multiple sclerosis: Secondary | ICD-10-CM | POA: Diagnosis not present

## 2016-08-31 DIAGNOSIS — R498 Other voice and resonance disorders: Secondary | ICD-10-CM | POA: Diagnosis not present

## 2016-08-31 DIAGNOSIS — F801 Expressive language disorder: Secondary | ICD-10-CM | POA: Diagnosis not present

## 2016-08-31 DIAGNOSIS — R471 Dysarthria and anarthria: Secondary | ICD-10-CM | POA: Diagnosis not present

## 2016-08-31 DIAGNOSIS — M6281 Muscle weakness (generalized): Secondary | ICD-10-CM | POA: Diagnosis not present

## 2016-09-03 DIAGNOSIS — R498 Other voice and resonance disorders: Secondary | ICD-10-CM | POA: Diagnosis not present

## 2016-09-03 DIAGNOSIS — F801 Expressive language disorder: Secondary | ICD-10-CM | POA: Diagnosis not present

## 2016-09-03 DIAGNOSIS — M6281 Muscle weakness (generalized): Secondary | ICD-10-CM | POA: Diagnosis not present

## 2016-09-03 DIAGNOSIS — R471 Dysarthria and anarthria: Secondary | ICD-10-CM | POA: Diagnosis not present

## 2016-09-03 DIAGNOSIS — I10 Essential (primary) hypertension: Secondary | ICD-10-CM | POA: Diagnosis not present

## 2016-09-03 DIAGNOSIS — G35 Multiple sclerosis: Secondary | ICD-10-CM | POA: Diagnosis not present

## 2016-09-04 ENCOUNTER — Ambulatory Visit (INDEPENDENT_AMBULATORY_CARE_PROVIDER_SITE_OTHER): Payer: Medicare Other | Admitting: Internal Medicine

## 2016-09-04 ENCOUNTER — Encounter: Payer: Self-pay | Admitting: Internal Medicine

## 2016-09-04 VITALS — BP 118/70 | HR 65 | Temp 98.6°F | Resp 12

## 2016-09-04 DIAGNOSIS — E538 Deficiency of other specified B group vitamins: Secondary | ICD-10-CM | POA: Diagnosis not present

## 2016-09-04 DIAGNOSIS — I10 Essential (primary) hypertension: Secondary | ICD-10-CM

## 2016-09-04 DIAGNOSIS — Z125 Encounter for screening for malignant neoplasm of prostate: Secondary | ICD-10-CM

## 2016-09-04 DIAGNOSIS — E7800 Pure hypercholesterolemia, unspecified: Secondary | ICD-10-CM | POA: Diagnosis not present

## 2016-09-04 DIAGNOSIS — G35 Multiple sclerosis: Secondary | ICD-10-CM

## 2016-09-04 DIAGNOSIS — G35D Multiple sclerosis, unspecified: Secondary | ICD-10-CM

## 2016-09-04 DIAGNOSIS — C61 Malignant neoplasm of prostate: Secondary | ICD-10-CM | POA: Diagnosis not present

## 2016-09-04 MED ORDER — HYDROCHLOROTHIAZIDE 12.5 MG PO CAPS: ORAL_CAPSULE | ORAL | 0 refills | Status: DC

## 2016-09-04 MED ORDER — AMLODIPINE BESYLATE 5 MG PO TABS: 5.0000 mg | ORAL_TABLET | Freq: Every day | ORAL | 0 refills | Status: DC

## 2016-09-04 NOTE — Progress Notes (Signed)
Patient ID: Philip Gordon, male   DOB: January 13, 1951, 66 y.o.   MRN: [id number]   Subjective:    Patient ID: Philip Gordon, male    DOB: [birth year], 66 y.o.   MRN: [id number]  HPI  Patient here for a scheduled follow up.  He is accompanied by his niece.  History obtained from both of them.  He sees Dr Manuella Ghazi now for MS.  Note reviewed.  Progressing.  Recently placed on prednisone taper.  Felt better on the prednisone.  Taking aricipt.  Physical therapy working with pt.  No chest pain.  Breathing stable. No acid reflux reported.  No abdominal pain.  Bowels moving.  Discussed assisted living.  He declines.  Sees Dr [name].  On finastaride.  Following psa.     Past Medical History:  Diagnosis Date  . Depression   . Hypercholesterolemia   . Hypertension   . Multiple sclerosis (HCC)    Sees Dr Ala [facility])  . Prostate cancer [facility])    Followed by Dr [name] and Dr [name]   Past Surgical History:  Procedure Laterality Date  . KNEE SURGERY     Family History  Problem Relation Age of Onset  . Hypertension Mother   . Stroke Father   . Diabetes Maternal Grandmother   . Diabetes Maternal Grandfather   . Leukemia Sister        died - age 67   Social History   Social History  . Marital status: Divorced    Spouse name: N/A  . Number of children: N/A  . Years of education: N/A   Social History Main Topics  . Smoking status: Never Smoker  . Smokeless tobacco: Current User    Types: Chew     Comment: Is going to try and cut down on the amount.   . Alcohol use No  . Drug use: No  . Sexual activity: Not Asked   Other Topics Concern  . None   Social History Narrative  . None    Outpatient Encounter Prescriptions as of 09/04/2016  Medication Sig  . amLODipine (NORVASC) 5 MG tablet Take 1 tablet (5 mg total) by mouth daily.  Marland Kitchen azelastine (ASTELIN) 0.1 % nasal spray Place 1 spray into both nostrils 2 (two) times daily. Use in each nostril as directed  . donepezil (ARICEPT) 10  MG tablet TAKE 1/2 TABLET DAILY FOR THEN FIRST MONTH THEN TAKE ONE TABLET DAILY  . ezetimibe (ZETIA) 10 MG tablet Take 1 tablet (10 mg total) by mouth daily.  . finasteride (PROSCAR) 5 MG tablet Take 1 tablet (5 mg total) by mouth daily.  . hydrochlorothiazide (MICROZIDE) 12.5 MG capsule TAKE 1 CAPSULE (12.5 MG TOTAL) BY MOUTH DAILY.  . mirtazapine (REMERON) 15 MG tablet TAKE 1/2 TABLET BY MOUTH AT BEDTIME  . tiZANidine (ZANAFLEX) 4 MG tablet TAKE 1 TABLET BY MOUTH NIGHTLY  . vitamin B-12 (CYANOCOBALAMIN) 1000 MCG tablet Take 1 tablet (1,000 mcg total) by mouth daily.  . [DISCONTINUED] amLODipine (NORVASC) 5 MG tablet Take 1 tablet (5 mg total) by mouth daily.  . [DISCONTINUED] hydrochlorothiazide (MICROZIDE) 12.5 MG capsule TAKE 1 CAPSULE (12.5 MG TOTAL) BY MOUTH DAILY.   No facility-administered encounter medications on file as of 09/04/2016.     Review of Systems  Constitutional: Negative for appetite change and unexpected weight change.  HENT: Negative for congestion and sinus pressure.   Respiratory: Negative for cough, chest tightness and shortness of breath.   Cardiovascular: Negative for chest pain,  palpitations and leg swelling.  Gastrointestinal: Negative for abdominal pain, diarrhea, nausea and vomiting.  Genitourinary: Negative for difficulty urinating and dysuria.  Musculoskeletal: Negative for joint swelling and myalgias.  Skin: Negative for color change and rash.  Neurological: Negative for dizziness, light-headedness and headaches.  Psychiatric/Behavioral: Negative for agitation and dysphoric mood.       Objective:    Physical Exam  Constitutional: He appears well-developed and well-nourished. No distress.  HENT:  Nose: Nose normal.  Mouth/Throat: Oropharynx is clear and moist.  Neck: Neck supple. No thyromegaly present.  Cardiovascular: Normal rate and regular rhythm.   Pulmonary/Chest: Effort normal and breath sounds normal. No respiratory distress.  Abdominal:  Soft. Bowel sounds are normal. There is no tenderness.  Musculoskeletal: He exhibits no edema or tenderness.  Braces in place.    Lymphadenopathy:    He has no cervical adenopathy.  Skin: No rash noted. No erythema.  Psychiatric: He has a normal mood and affect. His behavior is normal.    BP 118/70 (BP Location: Left Arm, Patient Position: Sitting, Cuff Size: Normal)   Pulse 65   Temp 98.6 F (37 C) (Oral)   Resp 12   SpO2 98%  Wt Readings from Last 3 Encounters:  10/18/15 147 lb (66.7 kg)  06/07/15 148 lb 12 oz (67.5 kg)  01/12/15 148 lb 13 oz (67.5 kg)     Lab Results  Component Value Date   WBC 7.9 09/04/2016   HGB 10.5 (L) 09/04/2016   HCT 30.8 (L) 09/04/2016   PLT 396 09/04/2016   GLUCOSE 91 09/04/2016   CHOL 279 (H) 10/18/2015   TRIG 166.0 (H) 10/18/2015   HDL 62.40 10/18/2015   LDLDIRECT 129.6 01/30/2013   LDLCALC 183 (H) 10/18/2015   ALT 14 09/04/2016   AST 14 09/04/2016   NA 138 09/04/2016   K 3.9 09/04/2016   CL 103 09/04/2016   CREATININE 1.18 09/04/2016   BUN 17 09/04/2016   CO2 28 09/04/2016   TSH 1.70 09/04/2016   PSA 0.01 (L) 09/04/2016    Mr Brain W Wo Contrast  Result Date: 03/03/2015 CLINICAL DATA:  CNS lymphoma.  Multiple sclerosis. EXAM: MRI HEAD WITHOUT AND WITH CONTRAST TECHNIQUE: Multiplanar, multiecho pulse sequences of the brain and surrounding structures were obtained without and with intravenous contrast. CONTRAST:  62mL MULTIHANCE GADOBENATE DIMEGLUMINE 529 MG/ML IV SOLN COMPARISON:  Head CT 11/15/2014 and MRI 06/08/2014 FINDINGS: There is no evidence of acute infarct, midline shift, or extra-axial fluid collection. Moderate cerebral atrophy is again seen. A focus of chronic microhemorrhage in the high posterior medial left frontal lobe is unchanged. Susceptibility artifacts/blood products along the medial left parietal lobe are much less conspicuous. A tiny developmental venous anomaly in the high posterior medial right frontal lobe is  unchanged. The residual enhancement in the medial left parietal lobe on the prior study has now completely resolved. There is unchanged, minimal residual cortical/ subcortical T2 hyperintensity in this region. No new enhancing brain lesions are identified. Foci of T2 hyperintensity elsewhere in the cerebral white matter bilaterally are unchanged, greatest in the periventricular regions and with involvement of the splenium of the corpus callosum, compatible with history of multiple sclerosis. Small focus of enhancement in the right parietal scalp is unchanged. Orbits are unremarkable on this nondedicated examination. Chronic left sphenoid sinusitis is noted. Mastoid air cells are clear. IMPRESSION: Complete resolution of enhancement in the medial left parietal lobe with unchanged, minimal residual T2 hyperintensity/gliosis. No new intracranial abnormality. Electronically Signed  By: Logan Bores M.D.   On: 03/03/2015 14:47       Assessment & Plan:   Problem List Items Addressed This Visit    B12 deficiency    Check B12 level.  Not receiving b12 injections.        Relevant Orders   Vitamin B12 (Completed)   CA of prostate ([place])    Followed by Dr [name].        Hypercholesteremia    Still taking zetia.  Unable to take statin medication.  Follow lipid panel.        Relevant Medications   hydrochlorothiazide (MICROZIDE) 12.5 MG capsule   amLODipine (NORVASC) 5 MG tablet   Other Relevant Orders   Hepatic function panel (Completed)   Hypertension - Primary   Relevant Medications   hydrochlorothiazide (MICROZIDE) 12.5 MG capsule   amLODipine (NORVASC) 5 MG tablet   Other Relevant Orders   Basic metabolic panel (Completed)   TSH (Completed)   CBC w/Diff (Completed)   Multiple sclerosis ([place])    Followed by Dr Manuella Ghazi.  Note reviewed.  Recent prednisone taper.  Helped.  Working with physical therapy.         Other Visit Diagnoses    Prostate cancer screening       Relevant Orders   PSA,  Medicare (Completed)       Einar Pheasant, MD

## 2016-09-04 NOTE — Progress Notes (Signed)
Pre-visit discussion using our clinic review tool. No additional management support is needed unless otherwise documented below in the visit note.  

## 2016-09-05 DIAGNOSIS — G35 Multiple sclerosis: Secondary | ICD-10-CM | POA: Diagnosis not present

## 2016-09-05 DIAGNOSIS — I10 Essential (primary) hypertension: Secondary | ICD-10-CM | POA: Diagnosis not present

## 2016-09-05 DIAGNOSIS — R498 Other voice and resonance disorders: Secondary | ICD-10-CM | POA: Diagnosis not present

## 2016-09-05 DIAGNOSIS — M6281 Muscle weakness (generalized): Secondary | ICD-10-CM | POA: Diagnosis not present

## 2016-09-05 DIAGNOSIS — R471 Dysarthria and anarthria: Secondary | ICD-10-CM | POA: Diagnosis not present

## 2016-09-05 DIAGNOSIS — F801 Expressive language disorder: Secondary | ICD-10-CM | POA: Diagnosis not present

## 2016-09-05 LAB — HEPATIC FUNCTION PANEL
ALT: 14 U/L (ref 0–53)
AST: 14 U/L (ref 0–37)
Albumin: 4.3 g/dL (ref 3.5–5.2)
Alkaline Phosphatase: 104 U/L (ref 39–117)
BILIRUBIN DIRECT: 0.3 mg/dL (ref 0.0–0.3)
TOTAL PROTEIN: 6.2 g/dL (ref 6.0–8.3)
Total Bilirubin: 2.4 mg/dL — ABNORMAL HIGH (ref 0.2–1.2)

## 2016-09-05 LAB — CBC WITH DIFFERENTIAL/PLATELET
BASOS ABS: 79 {cells}/uL (ref 0–200)
Basophils Relative: 1 %
EOS ABS: 79 {cells}/uL (ref 15–500)
Eosinophils Relative: 1 %
HEMATOCRIT: 30.8 % — AB (ref 38.5–50.0)
Hemoglobin: 10.5 g/dL — ABNORMAL LOW (ref 13.2–17.1)
LYMPHS PCT: 21 %
Lymphs Abs: 1659 cells/uL (ref 850–3900)
MCH: 33.1 pg — AB (ref 27.0–33.0)
MCHC: 34.1 g/dL (ref 32.0–36.0)
MCV: 97.2 fL (ref 80.0–100.0)
MPV: 8.9 fL (ref 7.5–12.5)
Monocytes Absolute: 948 cells/uL (ref 200–950)
Monocytes Relative: 12 %
NEUTROS PCT: 65 %
Neutro Abs: 5135 cells/uL (ref 1500–7800)
PLATELETS: 396 10*3/uL (ref 140–400)
RBC: 3.17 MIL/uL — ABNORMAL LOW (ref 4.20–5.80)
RDW: 16.1 % — AB (ref 11.0–15.0)
WBC: 7.9 10*3/uL (ref 3.8–10.8)

## 2016-09-05 LAB — BASIC METABOLIC PANEL
BUN: 17 mg/dL (ref 6–23)
CALCIUM: 9.4 mg/dL (ref 8.4–10.5)
CHLORIDE: 103 meq/L (ref 96–112)
CO2: 28 meq/L (ref 19–32)
Creatinine, Ser: 1.18 mg/dL (ref 0.40–1.50)
GFR: 65.23 mL/min (ref >60.00)
GLUCOSE: 91 mg/dL (ref 70–99)
Potassium: 3.9 mEq/L (ref 3.5–5.1)
Sodium: 138 mEq/L (ref 135–145)

## 2016-09-05 LAB — PSA, MEDICARE: PSA: 0.01 ng/ml — ABNORMAL LOW (ref 0.10–4.00)

## 2016-09-05 LAB — TSH: TSH: 1.7 u[IU]/mL (ref 0.35–4.50)

## 2016-09-05 LAB — VITAMIN B12: Vitamin B-12: 485 pg/mL (ref 211–911)

## 2016-09-06 ENCOUNTER — Encounter: Payer: Self-pay | Admitting: Internal Medicine

## 2016-09-06 NOTE — Assessment & Plan Note (Signed)
Check B12 level.  Not receiving b12 injections.

## 2016-09-06 NOTE — Assessment & Plan Note (Signed)
Followed by Dr Cope.  

## 2016-09-06 NOTE — Assessment & Plan Note (Signed)
Followed by Dr Manuella Ghazi.  Note reviewed.  Recent prednisone taper.  Helped.  Working with physical therapy.

## 2016-09-06 NOTE — Assessment & Plan Note (Signed)
Still taking zetia.  Unable to take statin medication.  Follow lipid panel.

## 2016-09-10 ENCOUNTER — Other Ambulatory Visit: Payer: Self-pay | Admitting: Internal Medicine

## 2016-09-10 ENCOUNTER — Telehealth: Payer: Self-pay | Admitting: *Deleted

## 2016-09-10 DIAGNOSIS — D649 Anemia, unspecified: Secondary | ICD-10-CM

## 2016-09-10 NOTE — Telephone Encounter (Signed)
Patient requested a call [phone]

## 2016-09-10 NOTE — Progress Notes (Signed)
Order placed for f/u lab.   

## 2016-09-10 NOTE — Telephone Encounter (Signed)
See lab note.  

## 2016-09-13 ENCOUNTER — Other Ambulatory Visit (INDEPENDENT_AMBULATORY_CARE_PROVIDER_SITE_OTHER): Payer: Medicare Other

## 2016-09-13 ENCOUNTER — Other Ambulatory Visit: Payer: Medicare Other

## 2016-09-13 DIAGNOSIS — D649 Anemia, unspecified: Secondary | ICD-10-CM | POA: Diagnosis not present

## 2016-09-13 DIAGNOSIS — R7989 Other specified abnormal findings of blood chemistry: Secondary | ICD-10-CM

## 2016-09-13 LAB — FERRITIN: FERRITIN: 175.1 ng/mL (ref 22.0–322.0)

## 2016-09-13 LAB — IBC PANEL
Iron: 205 ug/dL — ABNORMAL HIGH (ref 42–165)
Saturation Ratios: 61.5 % — ABNORMAL HIGH (ref 20.0–50.0)
Transferrin: 238 mg/dL (ref 212.0–360.0)

## 2016-09-13 LAB — HEPATIC FUNCTION PANEL
ALT: 41 U/L (ref 0–53)
AST: 31 U/L (ref 0–37)
Albumin: 4.1 g/dL (ref 3.5–5.2)
Alkaline Phosphatase: 89 U/L (ref 39–117)
BILIRUBIN DIRECT: 0.3 mg/dL (ref 0.0–0.3)
BILIRUBIN TOTAL: 2.7 mg/dL — AB (ref 0.2–1.2)
Total Protein: 6.2 g/dL (ref 6.0–8.3)

## 2016-09-14 LAB — PATHOLOGIST SMEAR REVIEW

## 2016-09-14 LAB — FOLATE RBC: RBC Folate: 955 ng/mL (ref >280)

## 2016-09-17 ENCOUNTER — Other Ambulatory Visit: Payer: Self-pay | Admitting: Internal Medicine

## 2016-09-17 DIAGNOSIS — D649 Anemia, unspecified: Secondary | ICD-10-CM

## 2016-09-17 NOTE — Progress Notes (Signed)
Order placed for hematology referral.  

## 2016-09-21 ENCOUNTER — Encounter: Payer: Self-pay | Admitting: Internal Medicine

## 2016-09-27 ENCOUNTER — Telehealth: Payer: Self-pay | Admitting: Internal Medicine

## 2016-09-27 NOTE — Telephone Encounter (Signed)
Pt called asking to speak with you in regards to results. Please advise, thank you!  Call pt @ [phone]

## 2016-09-27 NOTE — Telephone Encounter (Signed)
Called patient answered questions and mailed copy of last two sets of labs to home address.

## 2016-10-11 ENCOUNTER — Encounter: Payer: Self-pay | Admitting: Oncology

## 2016-10-11 ENCOUNTER — Other Ambulatory Visit: Payer: Self-pay

## 2016-10-11 ENCOUNTER — Inpatient Hospital Stay: Payer: Medicare Other | Attending: Oncology | Admitting: Oncology

## 2016-10-11 ENCOUNTER — Inpatient Hospital Stay: Payer: Medicare Other | Admitting: *Deleted

## 2016-10-11 VITALS — BP 128/69 | HR 71 | Temp 98.0°F | Resp 16 | Wt 146.0 lb

## 2016-10-11 DIAGNOSIS — I10 Essential (primary) hypertension: Secondary | ICD-10-CM | POA: Insufficient documentation

## 2016-10-11 DIAGNOSIS — D891 Cryoglobulinemia: Secondary | ICD-10-CM

## 2016-10-11 DIAGNOSIS — D649 Anemia, unspecified: Secondary | ICD-10-CM | POA: Insufficient documentation

## 2016-10-11 DIAGNOSIS — J329 Chronic sinusitis, unspecified: Secondary | ICD-10-CM | POA: Insufficient documentation

## 2016-10-11 DIAGNOSIS — E538 Deficiency of other specified B group vitamins: Secondary | ICD-10-CM | POA: Diagnosis not present

## 2016-10-11 DIAGNOSIS — Z8546 Personal history of malignant neoplasm of prostate: Secondary | ICD-10-CM | POA: Insufficient documentation

## 2016-10-11 DIAGNOSIS — F329 Major depressive disorder, single episode, unspecified: Secondary | ICD-10-CM | POA: Insufficient documentation

## 2016-10-11 DIAGNOSIS — E7800 Pure hypercholesterolemia, unspecified: Secondary | ICD-10-CM | POA: Diagnosis not present

## 2016-10-11 DIAGNOSIS — Z79899 Other long term (current) drug therapy: Secondary | ICD-10-CM | POA: Diagnosis not present

## 2016-10-11 DIAGNOSIS — N329 Bladder disorder, unspecified: Secondary | ICD-10-CM | POA: Insufficient documentation

## 2016-10-11 DIAGNOSIS — G939 Disorder of brain, unspecified: Secondary | ICD-10-CM | POA: Insufficient documentation

## 2016-10-11 DIAGNOSIS — G35 Multiple sclerosis: Secondary | ICD-10-CM | POA: Diagnosis not present

## 2016-10-11 LAB — CBC WITH DIFFERENTIAL/PLATELET
BAND NEUTROPHILS: 0 %
BASOS ABS: 0.1 10*3/uL (ref 0–0.1)
BASOS PCT: 1 %
BLASTS: 0 %
EOS ABS: 0.1 10*3/uL (ref 0–0.7)
EOS PCT: 2 %
HCT: 27.8 % — ABNORMAL LOW (ref 40.0–52.0)
HEMOGLOBIN: 10.1 g/dL — AB (ref 13.0–18.0)
LYMPHS ABS: 1.1 10*3/uL (ref 1.0–3.6)
LYMPHS PCT: 16 %
MCH: 32.6 pg (ref 26.0–34.0)
MCHC: 36.4 g/dL — ABNORMAL HIGH (ref 32.0–36.0)
MCV: 89.8 fL (ref 80.0–100.0)
METAMYELOCYTES PCT: 0 %
MONO ABS: 0.8 10*3/uL (ref 0.2–1.0)
MONOS PCT: 12 %
Myelocytes: 1 %
NEUTROS ABS: 4.8 10*3/uL (ref 1.4–6.5)
Neutrophils Relative %: 68 %
Other: 0 %
PLATELETS: 331 10*3/uL (ref 150–440)
Promyelocytes Absolute: 0 %
RBC: 3.09 MIL/uL — ABNORMAL LOW (ref 4.40–5.90)
RDW: 12.8 % (ref 11.5–14.5)
WBC: 6.9 10*3/uL (ref 3.8–10.6)

## 2016-10-11 LAB — COMPREHENSIVE METABOLIC PANEL
ALT: 26 U/L (ref 17–63)
ANION GAP: 7 (ref 5–15)
AST: 23 U/L (ref 15–41)
Albumin: 4.2 g/dL (ref 3.5–5.0)
Alkaline Phosphatase: 107 U/L (ref 38–126)
BUN: 17 mg/dL (ref 6–20)
CHLORIDE: 101 mmol/L (ref 101–111)
CO2: 28 mmol/L (ref 22–32)
CREATININE: 1.32 mg/dL — AB (ref 0.61–1.24)
Calcium: 9.4 mg/dL (ref 8.9–10.3)
GFR, EST NON AFRICAN AMERICAN: 54 mL/min — AB (ref >60)
Glucose, Bld: 87 mg/dL (ref 65–99)
Potassium: 3.8 mmol/L (ref 3.5–5.1)
SODIUM: 136 mmol/L (ref 135–145)
Total Bilirubin: 2.8 mg/dL — ABNORMAL HIGH (ref 0.3–1.2)
Total Protein: 7.2 g/dL (ref 6.5–8.1)

## 2016-10-11 LAB — SEDIMENTATION RATE: Sed Rate: 23 mm/hr — ABNORMAL HIGH (ref 0–20)

## 2016-10-11 LAB — IRON AND TIBC
IRON: 152 ug/dL (ref 45–182)
Saturation Ratios: 46 % — ABNORMAL HIGH (ref 17.9–39.5)
TIBC: 332 ug/dL (ref 250–450)
UIBC: 180 ug/dL

## 2016-10-11 LAB — RETICULOCYTES
RBC.: 3.09 MIL/uL — AB (ref 4.40–5.90)
RETIC COUNT ABSOLUTE: 83.4 10*3/uL (ref 19.0–183.0)
Retic Ct Pct: 2.7 % (ref 0.4–3.1)

## 2016-10-11 LAB — C-REACTIVE PROTEIN: CRP: 3.1 mg/dL — ABNORMAL HIGH (ref <1.0)

## 2016-10-11 MED ORDER — FINASTERIDE 5 MG PO TABS: 5.0000 mg | ORAL_TABLET | Freq: Every day | ORAL | 0 refills | Status: DC

## 2016-10-11 NOTE — Progress Notes (Signed)
Patient here for initial visit. He states he has been feeling tired for the past few months. He has had PT at home for 3 months.

## 2016-10-11 NOTE — Progress Notes (Signed)
Hematology/Oncology Consult note [facility] Telephone:([phone] [phone]  Patient Care Team: Einar Pheasant, MD as PCP - General (Internal Medicine)   Name of the patient: Philip Gordon  [id number]  12/08/1950    Reason for referral- anemia   Referring physician- Dr. Einar Pheasant  Date of visit: 10/11/16   History of presenting illness- patient is a 66 year old male who sees [name] for multiple sclerosis. He is currently on prednisone taper and also takes Aricept. Recent CBC from 09/04/2016 showed white count of 7.9, H&H of 10.5/30.8 with an MCV of 97.2 and a platelet count of 396. Cold agglutinin/cryoglobulin was sus recent B12, TSH and hepatic function panel was normal.  iron study showed an increased iron saturation of 61.5%. Ferritin was 175 and folate was within normal limits.   Patient has secondary progressive MS diagnosed in 1980 now gradually getting worse. He lives alone and uses walker for ambulation. He thinks he is doing well staying alone. But his guardians think otherwise and feel he is failing slowly and needs more help  ECOG PS- 3  Pain scale- 0   Review of systems- Review of Systems  Constitutional: Positive for malaise/fatigue. Negative for chills, fever and weight loss.  HENT: Negative for congestion, ear discharge and nosebleeds.   Eyes: Negative for blurred vision.  Respiratory: Negative for cough, hemoptysis, sputum production, shortness of breath and wheezing.   Cardiovascular: Negative for chest pain, palpitations, orthopnea and claudication.  Gastrointestinal: Negative for abdominal pain, blood in stool, constipation, diarrhea, heartburn, melena, nausea and vomiting.  Genitourinary: Negative for dysuria, flank pain, frequency, hematuria and urgency.  Musculoskeletal: Negative for back pain, joint pain and myalgias.  Skin: Negative for rash.  Neurological: Positive for focal weakness and weakness. Negative for  dizziness, tingling, seizures and headaches.  Endo/Heme/Allergies: Does not bruise/bleed easily.  Psychiatric/Behavioral: Negative for depression and suicidal ideas. The patient does not have insomnia.     No Known Allergies  Patient Active Problem List   Diagnosis Date Noted  . Sinus infection 11/15/2014  . Right mandibular swelling 11/15/2014  . Brain lesion 01/17/2014  . Obstruction of urinary tract 10/15/2012  . Disorder of bladder function 01/16/2012  . Incomplete bladder emptying 01/16/2012  . CA of prostate ([place]) 01/16/2012  . DS (disseminated sclerosis) ([place]) 01/16/2012  . Malignant neoplasm of prostate ([place]) 01/16/2012  . Hypertension 12/28/2011  . Hypercholesteremia 12/28/2011  . Multiple sclerosis ([place]) 12/28/2011  . B12 deficiency 12/28/2011  . Acquired ankle/foot deformity 12/07/2010     Past Medical History:  Diagnosis Date  . Depression   . Hypercholesterolemia   . Hypertension   . Multiple sclerosis (HCC)    Sees Dr Ala Bent [facility])  . Prostate cancer [facility])    Followed by Dr [name] and Dr [name]     Past Surgical History:  Procedure Laterality Date  . KNEE SURGERY      Social History   Social History  . Marital status: Divorced    Spouse name: N/A  . Number of children: N/A  . Years of education: N/A   Occupational History  . Not on file.   Social History Main Topics  . Smoking status: Never Smoker  . Smokeless tobacco: Current User    Types: Chew     Comment: Is going to try and cut down on the amount.   . Alcohol use No  . Drug use: No  . Sexual activity: Not on file   Other Topics Concern  .  Not on file   Social History Narrative  . No narrative on file     Family History  Problem Relation Age of Onset  . Hypertension Mother   . Stroke Father   . Diabetes Maternal Grandmother   . Diabetes Maternal Grandfather   . Leukemia Sister        died - age 99     Current Outpatient Prescriptions:  .  amLODipine (NORVASC) 5  MG tablet, Take 1 tablet (5 mg total) by mouth daily., Disp: 90 tablet, Rfl: 0 .  azelastine (ASTELIN) 0.1 % nasal spray, Place 1 spray into both nostrils 2 (two) times daily. Use in each nostril as directed, Disp: 30 mL, Rfl: 1 .  donepezil (ARICEPT) 10 MG tablet, TAKE 1/2 TABLET DAILY FOR THEN FIRST MONTH THEN TAKE ONE TABLET DAILY, Disp: , Rfl: 5 .  ezetimibe (ZETIA) 10 MG tablet, Take 1 tablet (10 mg total) by mouth daily., Disp: 90 tablet, Rfl: 0 .  finasteride (PROSCAR) 5 MG tablet, Take 1 tablet (5 mg total) by mouth daily., Disp: 90 tablet, Rfl: 0 .  hydrochlorothiazide (MICROZIDE) 12.5 MG capsule, TAKE 1 CAPSULE (12.5 MG TOTAL) BY MOUTH DAILY., Disp: 90 capsule, Rfl: 0 .  mirtazapine (REMERON) 15 MG tablet, TAKE 1/2 TABLET BY MOUTH AT BEDTIME, Disp: 15 tablet, Rfl: 4 .  tiZANidine (ZANAFLEX) 4 MG tablet, TAKE 1 TABLET BY MOUTH NIGHTLY, Disp: 30 tablet, Rfl: 4 .  vitamin B-12 (CYANOCOBALAMIN) 1000 MCG tablet, Take 1 tablet (1,000 mcg total) by mouth daily., Disp: 90 tablet, Rfl: 3   Physical exam:  Vitals:   10/11/16 1228  BP: 128/69  Pulse: 71  Resp: 16  Temp: 98 F (36.7 C)  TempSrc: Tympanic  Weight: 146 lb (66.2 kg)   Physical Exam  Constitutional: He is oriented to person, place, and time.  Thin gentleman sitting in a wheelchair. Appears in no acute distress  HENT:  Head: Normocephalic and atraumatic.  Eyes: Pupils are equal, round, and reactive to light. EOM are normal.  Neck: Normal range of motion.  Cardiovascular: Normal rate, regular rhythm and normal heart sounds.   Pulmonary/Chest: Effort normal and breath sounds normal.  Abdominal: Soft. Bowel sounds are normal.  Neurological: He is alert and oriented to person, place, and time.  Power 4/5 in b/l LE  Skin: Skin is warm and dry.  No rashes       CMP Latest Ref Rng & Units 09/13/2016  Glucose 70 - 99 mg/dL -  BUN 6 - 23 mg/dL -  Creatinine 0.40 - 1.50 mg/dL -  Sodium 135 - 145 mEq/L -  Potassium 3.5 -  5.1 mEq/L -  Chloride 96 - 112 mEq/L -  CO2 19 - 32 mEq/L -  Calcium 8.4 - 10.5 mg/dL -  Total Protein 6.0 - 8.3 g/dL 6.2  Total Bilirubin 0.2 - 1.2 mg/dL 2.7(H)  Alkaline Phos 39 - 117 U/L 89  AST 0 - 37 U/L 31  ALT 0 - 53 U/L 41   CBC Latest Ref Rng & Units 09/04/2016  WBC 3.8 - 10.8 K/uL 7.9  Hemoglobin 13.2 - 17.1 g/dL 10.5(L)  Hematocrit 38.5 - 50.0 % 30.8(L)  Platelets 140 - 400 K/uL 396     Assessment and plan- Patient is a 66 y.o. 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 Will check vasculitis panel, complement levels, rheumatoid factor, ESR., CRP, HIV, Hep B and Hep C testing. Test for myeloma panel, CMP and pathology smear review  Normocytic anemia- iron studies, b12, folate, TSH normal. Will check retic count haptoglobin and repeat iron studies  RTC in 2 weeks to discuss further management   Thank you for this kind referral and the opportunity to participate in the care of this patient   Visit Diagnosis 1. 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[place])   2. Normocytic anemia     Dr. Randa Evens, MD, MPH [place] [facility] Pager- [phone] 10/11/2016

## 2016-10-12 LAB — HEPATITIS C ANTIBODY: HCV Ab: <0.1 s/co ratio (ref 0.0–0.9)

## 2016-10-12 LAB — HEPATITIS B CORE ANTIBODY, TOTAL: HEP B C TOTAL AB: NEGATIVE

## 2016-10-12 LAB — ANA COMPREHENSIVE PANEL
DS DNA AB: 1 [IU]/mL (ref 0–9)
ENA SM Ab Ser-aCnc: <0.2 AI (ref 0.0–0.9)
Ribonucleic Protein: 0.8 AI (ref 0.0–0.9)
SSA (Ro) (ENA) Antibody, IgG: <0.2 AI (ref 0.0–0.9)
SSB (La) (ENA) Antibody, IgG: <0.2 AI (ref 0.0–0.9)
Scleroderma (Scl-70) (ENA) Antibody, IgG: <0.2 AI (ref 0.0–0.9)

## 2016-10-12 LAB — HEPATITIS B SURFACE ANTIGEN: HEP B S AG: NEGATIVE

## 2016-10-12 LAB — HAPTOGLOBIN

## 2016-10-12 LAB — RHEUMATOID FACTOR: Rheumatoid fact SerPl-aCnc: <10 IU/mL (ref 0.0–13.9)

## 2016-10-12 LAB — C3 COMPLEMENT: C3 Complement: 121 mg/dL (ref 82–167)

## 2016-10-12 LAB — C4 COMPLEMENT: Complement C4, Body Fluid: 16 mg/dL (ref 14–44)

## 2016-10-12 LAB — HIV ANTIBODY (ROUTINE TESTING W REFLEX): HIV Screen 4th Generation wRfx: NONREACTIVE

## 2016-10-12 LAB — HEPATITIS B SURFACE ANTIBODY, QUANTITATIVE: Hep B S AB Quant (Post): <3.1 m[IU]/mL — ABNORMAL LOW (ref >9.9)

## 2016-10-12 LAB — PATHOLOGIST SMEAR REVIEW

## 2016-10-15 LAB — MULTIPLE MYELOMA PANEL, SERUM
ALBUMIN/GLOB SERPL: 1.5 (ref 0.7–1.7)
ALPHA 1: 0.3 g/dL (ref 0.0–0.4)
ALPHA2 GLOB SERPL ELPH-MCNC: 0.5 g/dL (ref 0.4–1.0)
Albumin SerPl Elph-Mcnc: 3.8 g/dL (ref 2.9–4.4)
B-GLOBULIN SERPL ELPH-MCNC: 1 g/dL (ref 0.7–1.3)
GAMMA GLOB SERPL ELPH-MCNC: 0.8 g/dL (ref 0.4–1.8)
GLOBULIN, TOTAL: 2.6 g/dL (ref 2.2–3.9)
IGG (IMMUNOGLOBIN G), SERUM: 756 mg/dL (ref 700–1600)
IgA: 139 mg/dL (ref 61–437)
IgM, Serum: 103 mg/dL (ref 20–172)
Total Protein ELP: 6.4 g/dL (ref 6.0–8.5)

## 2016-10-16 ENCOUNTER — Other Ambulatory Visit: Payer: Self-pay | Admitting: Oncology

## 2016-10-16 ENCOUNTER — Telehealth: Payer: Self-pay | Admitting: *Deleted

## 2016-10-16 DIAGNOSIS — D5919 Other autoimmune hemolytic anemia: Secondary | ICD-10-CM

## 2016-10-16 DIAGNOSIS — D5912 Cold autoimmune hemolytic anemia: Secondary | ICD-10-CM

## 2016-10-16 DIAGNOSIS — R5383 Other fatigue: Secondary | ICD-10-CM

## 2016-10-16 DIAGNOSIS — D591 Other autoimmune hemolytic anemias: Principal | ICD-10-CM

## 2016-10-16 DIAGNOSIS — C61 Malignant neoplasm of prostate: Secondary | ICD-10-CM

## 2016-10-16 NOTE — Telephone Encounter (Signed)
Called and spoke to pt about his lab results and that based on labs that dr Janese Banks would like to have a CT scan of chest,abd, pelvis. He wanted me to call his POA and I called Mr. Tamala Julian and explained to him about the hemolytic anemia and sometimes it can be linked to lymphoma. And to determine if he has any enlarged lymph nodes we would need to do CT scan.  If we can get it this week then yes. POA is going out of town next week and can't bring him.  I also spoke to his wife Jeani Hawking and she will take pt to his scan since they are in agreement and the only place I could get it was [place] on Thursday of this week . Arrive at 10:15 for a 10:30. Nothing to eat or drink for 4 hours and pick up prep kit ahead of time. The wife has all these instructions and she will pick up prep kit here at [facility]..  Called pt back to let him know we are moving forward with CT scan  And he asked questions about drinking prep kit and I explained 1 bottle 1 hour before scan and then 1/2 bottle 30 in before scan and the radiology stafff will tell him when to drink other half. He has a hard time understanding [name] with her accent and I told him when next time he comes in I cna come in with him if he wants to help make sure he understands. He always comes in with his POA Mr. Tamala Julian.

## 2016-10-18 ENCOUNTER — Ambulatory Visit
Admission: RE | Admit: 2016-10-18 | Discharge: 2016-10-18 | Disposition: A | Discharge status: Home / Self Care | Payer: Medicare Other | Source: Ambulatory Visit | Attending: Oncology | Admitting: Oncology

## 2016-10-18 DIAGNOSIS — D591 Other autoimmune hemolytic anemias: Secondary | ICD-10-CM | POA: Insufficient documentation

## 2016-10-18 DIAGNOSIS — M4856XA Collapsed vertebra, not elsewhere classified, lumbar region, initial encounter for fracture: Secondary | ICD-10-CM | POA: Diagnosis not present

## 2016-10-18 DIAGNOSIS — R5383 Other fatigue: Secondary | ICD-10-CM | POA: Diagnosis not present

## 2016-10-18 DIAGNOSIS — C61 Malignant neoplasm of prostate: Secondary | ICD-10-CM | POA: Diagnosis not present

## 2016-10-18 DIAGNOSIS — I2510 Atherosclerotic heart disease of native coronary artery without angina pectoris: Secondary | ICD-10-CM | POA: Diagnosis not present

## 2016-10-18 DIAGNOSIS — I700 Atherosclerosis of aorta: Secondary | ICD-10-CM | POA: Diagnosis not present

## 2016-10-18 DIAGNOSIS — D5919 Other autoimmune hemolytic anemia: Secondary | ICD-10-CM

## 2016-10-18 DIAGNOSIS — N200 Calculus of kidney: Secondary | ICD-10-CM | POA: Insufficient documentation

## 2016-10-18 DIAGNOSIS — D599 Acquired hemolytic anemia, unspecified: Secondary | ICD-10-CM | POA: Diagnosis not present

## 2016-10-18 DIAGNOSIS — D5912 Cold autoimmune hemolytic anemia: Secondary | ICD-10-CM

## 2016-10-24 ENCOUNTER — Ambulatory Visit: Payer: Medicare Other

## 2016-10-30 ENCOUNTER — Inpatient Hospital Stay: Payer: Medicare Other

## 2016-10-30 ENCOUNTER — Telehealth: Payer: Self-pay | Admitting: Internal Medicine

## 2016-10-30 ENCOUNTER — Inpatient Hospital Stay: Payer: Medicare Other | Attending: Oncology | Admitting: Oncology

## 2016-10-30 VITALS — BP 112/70 | HR 71 | Temp 98.1°F | Resp 18

## 2016-10-30 DIAGNOSIS — D591 Other autoimmune hemolytic anemias: Secondary | ICD-10-CM

## 2016-10-30 DIAGNOSIS — F329 Major depressive disorder, single episode, unspecified: Secondary | ICD-10-CM | POA: Diagnosis not present

## 2016-10-30 DIAGNOSIS — Z8546 Personal history of malignant neoplasm of prostate: Secondary | ICD-10-CM | POA: Diagnosis not present

## 2016-10-30 DIAGNOSIS — L723 Sebaceous cyst: Secondary | ICD-10-CM | POA: Insufficient documentation

## 2016-10-30 DIAGNOSIS — Z79899 Other long term (current) drug therapy: Secondary | ICD-10-CM | POA: Insufficient documentation

## 2016-10-30 DIAGNOSIS — D5912 Cold autoimmune hemolytic anemia: Secondary | ICD-10-CM

## 2016-10-30 DIAGNOSIS — I700 Atherosclerosis of aorta: Secondary | ICD-10-CM | POA: Insufficient documentation

## 2016-10-30 DIAGNOSIS — I10 Essential (primary) hypertension: Secondary | ICD-10-CM | POA: Diagnosis not present

## 2016-10-30 DIAGNOSIS — N200 Calculus of kidney: Secondary | ICD-10-CM | POA: Insufficient documentation

## 2016-10-30 DIAGNOSIS — E7800 Pure hypercholesterolemia, unspecified: Secondary | ICD-10-CM | POA: Insufficient documentation

## 2016-10-30 DIAGNOSIS — G35 Multiple sclerosis: Secondary | ICD-10-CM | POA: Insufficient documentation

## 2016-10-30 LAB — CBC WITH DIFFERENTIAL/PLATELET
Basophils Absolute: 0.1 10*3/uL (ref 0–0.1)
Basophils Relative: 1 %
EOS ABS: 0.1 10*3/uL (ref 0–0.7)
Eosinophils Relative: 1 %
HCT: 27.8 % — ABNORMAL LOW (ref 40.0–52.0)
HEMOGLOBIN: 10 g/dL — AB (ref 13.0–18.0)
LYMPHS ABS: 1.2 10*3/uL (ref 1.0–3.6)
LYMPHS PCT: 18 %
MCH: 32.8 pg (ref 26.0–34.0)
MCHC: 36.1 g/dL — ABNORMAL HIGH (ref 32.0–36.0)
MCV: 90.8 fL (ref 80.0–100.0)
Monocytes Absolute: 0.8 10*3/uL (ref 0.2–1.0)
Monocytes Relative: 12 %
NEUTROS PCT: 68 %
Neutro Abs: 4.5 10*3/uL (ref 1.4–6.5)
Platelets: 364 10*3/uL (ref 150–440)
RBC: 3.06 MIL/uL — AB (ref 4.40–5.90)
RDW: 13.2 % (ref 11.5–14.5)
WBC: 6.6 10*3/uL (ref 3.8–10.6)

## 2016-10-30 LAB — RETICULOCYTES
RBC.: 2.98 MIL/uL — ABNORMAL LOW (ref 4.40–5.90)
RETIC CT PCT: 4.2 % — AB (ref 0.4–3.1)
Retic Count, Absolute: 125.2 10*3/uL (ref 19.0–183.0)

## 2016-10-30 NOTE — Progress Notes (Signed)
Hematology/Oncology Consult note [facility]  Telephone:([phone] [phone]  Patient Care Team: Einar Pheasant, MD as PCP - General (Internal Medicine)   Name of the patient: Philip Gordon  [id number]  1950/12/16   Date of visit: 10/30/16  Diagnosis- cold agglutinin hemolytic anemia  Chief complaint/ Reason for visit- discuss results of blood work and scans  Heme/Onc history: patient is a 66 year old male who sees [name] for multiple sclerosis. He is currently on prednisone taper and also takes Aricept. Recent CBC from 09/04/2016 showed white count of 7.9, H&H of 10.5/30.8 with an MCV of 97.2 and a platelet count of 396. Cold agglutinin/cryoglobulin was sus recent B12, TSH and hepatic function panel was normal.  iron study showed an increased iron saturation of 61.5%. Ferritin was 175 and folate was within normal limits.   Patient has secondary progressive MS diagnosed in 1980 now gradually getting worse. He lives alone and uses walker for ambulation. He thinks he is doing well staying alone. But his guardians think otherwise and feel he is failing slowly and needs more help  Results of bloodwork from 10/11/2016 were as follows: CBC showed white count of 6.9, H&H of 10.1/27.8 and a platelet count of 331. Iron studies were within normal limits. CMP was significant for elevated creatinine of 1.3 and elevated total bilirubin of 2.8.Marland Kitchen ANA was negative and C3 and C4 were within normal limits. HIV antibody was negative. Hepatitis B and C testing was negative. Multiple myeloma panel revealed no monoclonal protein. Rheumatoid factor was negative and ESR was mildly elevated at 23. CRP was elevated at 3.1. Haptoglobin was less than 10. Reticulocyte count was normal at 2.7%.  Review of peripheral blood smear revealed prominent RBC agglutination. 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  CT chest abdomen and pelvis without contrast did not reveal any evidence of adenopathy or malignancy.  Interval history- lives alone. Feels fatigued and cold   ECOG PS- 2-3 Pain scale- 0   Review of systems- Review of Systems  Constitutional: Positive for malaise/fatigue. Negative for chills, fever and weight loss.  HENT: Negative for congestion, ear discharge and nosebleeds.   Eyes: Negative for blurred vision.  Respiratory: Negative for cough, hemoptysis, sputum production, shortness of breath and wheezing.   Cardiovascular: Negative for chest pain, palpitations, orthopnea and claudication.  Gastrointestinal: Negative for abdominal pain, blood in stool, constipation, diarrhea, heartburn, melena, nausea and vomiting.  Genitourinary: Negative for dysuria, flank pain, frequency, hematuria and urgency.  Musculoskeletal: Negative for back pain, joint pain and myalgias.  Skin: Negative for rash.  Neurological: Negative for dizziness, tingling, focal weakness, seizures, weakness and headaches.  Endo/Heme/Allergies: Does not bruise/bleed easily.  Psychiatric/Behavioral: Negative for depression and suicidal ideas. The patient does not have insomnia.        No Known Allergies   Past Medical History:  Diagnosis Date  . Depression   . Hypercholesterolemia   . Hypertension   . Multiple sclerosis (HCC)    Sees Dr Ala Bent [facility])  . Prostate cancer [facility])    Followed by Dr [name] and Dr [name]     Past Surgical History:  Procedure Laterality Date  . KNEE SURGERY      Social History   Social History  . Marital status: Divorced    Spouse name: N/A  . Number of children: N/A  . Years of education: N/A  Occupational History  . Not on file.   Social History Main Topics  . Smoking status: Never Smoker  . Smokeless tobacco: Current User    Types: Chew     Comment: Is going to try and cut down on the  amount.   . Alcohol use No  . Drug use: No  . Sexual activity: Not on file   Other Topics Concern  . Not on file   Social History Narrative  . No narrative on file    Family History  Problem Relation Age of Onset  . Hypertension Mother   . Stroke Father   . Diabetes Maternal Grandmother   . Diabetes Maternal Grandfather   . Leukemia Sister        died - age 4     Current Outpatient Prescriptions:  .  amLODipine (NORVASC) 5 MG tablet, Take 1 tablet (5 mg total) by mouth daily., Disp: 90 tablet, Rfl: 0 .  azelastine (ASTELIN) 0.1 % nasal spray, Place 1 spray into both nostrils 2 (two) times daily. Use in each nostril as directed, Disp: 30 mL, Rfl: 1 .  donepezil (ARICEPT) 10 MG tablet, TAKE 1/2 TABLET DAILY FOR THEN FIRST MONTH THEN TAKE ONE TABLET DAILY, Disp: , Rfl: 5 .  ezetimibe (ZETIA) 10 MG tablet, Take 1 tablet (10 mg total) by mouth daily., Disp: 90 tablet, Rfl: 0 .  finasteride (PROSCAR) 5 MG tablet, Take 1 tablet (5 mg total) by mouth daily., Disp: 90 tablet, Rfl: 0 .  hydrochlorothiazide (MICROZIDE) 12.5 MG capsule, TAKE 1 CAPSULE (12.5 MG TOTAL) BY MOUTH DAILY., Disp: 90 capsule, Rfl: 0 .  mirtazapine (REMERON) 15 MG tablet, TAKE 1/2 TABLET BY MOUTH AT BEDTIME, Disp: 15 tablet, Rfl: 4 .  tiZANidine (ZANAFLEX) 4 MG tablet, TAKE 1 TABLET BY MOUTH NIGHTLY, Disp: 30 tablet, Rfl: 4 .  vitamin B-12 (CYANOCOBALAMIN) 1000 MCG tablet, Take 1 tablet (1,000 mcg total) by mouth daily., Disp: 90 tablet, Rfl: 3  Physical exam:  Vitals:   10/30/16 1338  BP: 112/70  Pulse: 71  Resp: 18  Temp: 98.1 F (36.7 C)  TempSrc: Tympanic   Physical Exam  Constitutional: He is oriented to person, place, and time.  Thin man sitting in a wheelchair. Appears fatigued  HENT:  Head: Normocephalic and atraumatic.  Eyes: Pupils are equal, round, and reactive to light. EOM are normal. Scleral icterus (mild) is present.  Neck: Normal range of motion.  Cardiovascular: Normal rate, regular  rhythm and normal heart sounds.   Pulmonary/Chest: Effort normal and breath sounds normal.  Abdominal: Soft. Bowel sounds are normal.  No palpable splenomegaly  Neurological: He is alert and oriented to person, place, and time.  Braces in place for b/l LE  Skin: Skin is warm and dry.     CMP Latest Ref Rng & Units 10/11/2016  Glucose 65 - 99 mg/dL 87  BUN 6 - 20 mg/dL 17  Creatinine 0.61 - 1.24 mg/dL 1.32(H)  Sodium 135 - 145 mmol/L 136  Potassium 3.5 - 5.1 mmol/L 3.8  Chloride 101 - 111 mmol/L 101  CO2 22 - 32 mmol/L 28  Calcium 8.9 - 10.3 mg/dL 9.4  Total Protein 6.5 - 8.1 g/dL 7.2  Total Bilirubin 0.3 - 1.2 mg/dL 2.8(H)  Alkaline Phos 38 - 126 U/L 107  AST 15 - 41 U/L 23  ALT 17 - 63 U/L 26   CBC Latest Ref Rng & Units 10/30/2016  WBC 3.8 - 10.6 K/uL 6.6  Hemoglobin 13.0 -   18.0 g/dL 10.0(L)  Hematocrit 40.0 - 52.0 % 27.8(L)  Platelets 150 - 440 K/uL 364    No images are attached to the encounter.  Ct Abdomen Pelvis Wo Contrast  Result Date: 10/18/2016 CLINICAL DATA:  Hemolytic anemia. History prostate cancer. Multiple sclerosis. Cold agglutinin disease. EXAM: CT CHEST, ABDOMEN AND PELVIS WITHOUT CONTRAST TECHNIQUE: Multidetector CT imaging of the chest, abdomen and pelvis was performed following the standard protocol without IV contrast. COMPARISON:  Multiple exams, including CT scan of 11/12/2013 FINDINGS: CT CHEST FINDINGS Cardiovascular: Coronary, aortic arch, and branch vessel atherosclerotic vascular disease. Heart size within normal limits. Low-density blood pool suggests anemia. Mediastinum/Nodes: Unremarkable Lungs/Pleura: Small likely calcified pulmonary nodule anteriorly in the left lower lobe on image 80/5 favoring old granulomatous disease. A previous left lower lobe 4 mm nodule is no longer present. No new nodule. Musculoskeletal: Old healed bilateral rib fractures. No worrisome or progressive rib lesions. Mild thoracic spondylosis. Several stable small cutaneous or  subcutaneous sebaceous cysts along the anterior chest, not changed from prior. CT ABDOMEN PELVIS FINDINGS Hepatobiliary: Contracted gallbladder.  Otherwise unremarkable. Pancreas: Unremarkable Spleen: Unremarkable Adrenals/Urinary Tract: Adrenal glands normal. Mild wall thickening in the urinary bladder although this may be due to nondistention. 1-2 mm nonobstructive right kidney upper pole calculus. 2 mm right kidney lower pole nonobstructive calculus. Stomach/Bowel: Unremarkable Vascular/Lymphatic: Aortoiliac atherosclerotic vascular disease. No pathologic adenopathy identified. Reproductive: Prostate seed implants noted. Other: No supplemental non-categorized findings. Musculoskeletal: Calcifications in the subcutaneous tissues overlying the buttocks likely are injection related. Suspected sebaceous cyst or similar benign lesion along the left lower buttock, image 126/2. Heterotopic ossification adjacent to the left femoral neck and greater trochanter posteriorly, similar appearance to 08/26/2009. There a few small benign-appearing lesions in the bony pelvis including a 9 mm sclerotic lesion in the left iliac bone on image 93/3 which is likely an enchondroma an which appears to of been present on 05/04/2009. Scattered bone islands in the pelvis and lumbar spine, suspicious for osteopoikilosis, stable when compared to prior exams. 50% compression fracture of L2, age indeterminate, with associated sclerotic 8 mm lesion, not changed from 11/12/2013. IMPRESSION: 1. No findings of adenopathy or specific indicators of lymphoma in the chest, abdomen, or pelvis. 2. No compelling findings of soft tissue metastatic prostate cancer. 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 3.  Aortic Atherosclerosis (ICD10-I70.0).  Coronary atherosclerosis. 4. Chronic 50% compression fracture of L 2. 5. Nonobstructive right nephrolithiasis. 6.  Heterotopic ossification adjacent to the left hip, chronic. Electronically Signed   By: Van Clines M.D.   On: 10/18/2016 14:30   Ct Chest Wo Contrast  Result Date: 10/18/2016 CLINICAL DATA:  Hemolytic anemia. History prostate cancer. Multiple sclerosis. Cold agglutinin disease. EXAM: CT CHEST, ABDOMEN AND PELVIS WITHOUT CONTRAST TECHNIQUE: Multidetector CT imaging of the chest, abdomen and pelvis was performed following the standard protocol without IV contrast. COMPARISON:  Multiple exams, including CT scan of 11/12/2013 FINDINGS: CT CHEST FINDINGS Cardiovascular: Coronary, aortic arch, and branch vessel atherosclerotic vascular disease. Heart size within normal limits. Low-density blood pool suggests anemia. Mediastinum/Nodes: Unremarkable Lungs/Pleura: Small likely calcified pulmonary nodule anteriorly in the left lower lobe on image 6/5 favoring old granulomatous disease. A previous left lower lobe 4 mm nodule is no longer present. No new nodule. Musculoskeletal: Old healed bilateral rib fractures. No worrisome or progressive rib lesions. Mild thoracic spondylosis. Several stable small cutaneous or subcutaneous sebaceous cysts along the  anterior chest, not changed from prior. CT ABDOMEN PELVIS FINDINGS Hepatobiliary: Contracted gallbladder.  Otherwise unremarkable. Pancreas: Unremarkable Spleen: Unremarkable Adrenals/Urinary Tract: Adrenal glands normal. Mild wall thickening in the urinary bladder although this may be due to nondistention. 1-2 mm nonobstructive right kidney upper pole calculus. 2 mm right kidney lower pole nonobstructive calculus. Stomach/Bowel: Unremarkable Vascular/Lymphatic: Aortoiliac atherosclerotic vascular disease. No pathologic adenopathy identified. Reproductive: Prostate seed implants noted. Other: No supplemental non-categorized findings. Musculoskeletal: Calcifications in the subcutaneous tissues overlying the buttocks likely are injection related. Suspected sebaceous  cyst or similar benign lesion along the left lower buttock, image 126/2. Heterotopic ossification adjacent to the left femoral neck and greater trochanter posteriorly, similar appearance to 08/26/2009. There a few small benign-appearing lesions in the bony pelvis including a 9 mm sclerotic lesion in the left iliac bone on image 93/3 which is likely an enchondroma an which appears to of been present on 05/04/2009. Scattered bone islands in the pelvis and lumbar spine, suspicious for osteopoikilosis, stable when compared to prior exams. 50% compression fracture of L2, age indeterminate, with associated sclerotic 8 mm lesion, not changed from 11/12/2013. IMPRESSION: 1. No findings of adenopathy or specific indicators of lymphoma in the chest, abdomen, or pelvis. 2. No compelling findings of soft tissue metastatic prostate cancer. 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 3.  Aortic Atherosclerosis (ICD10-I70.0).  Coronary atherosclerosis. 4. Chronic 50% compression fracture of L 2. 5. Nonobstructive right nephrolithiasis. 6. Heterotopic ossification adjacent to the left hip, chronic. Electronically Signed   By: Van Clines M.D.   On: 10/18/2016 14:30     Assessment and plan- Patient is a 66 y.o. male with cold agglutinin hemolytic anemia  Blood work consistent with cold agglutinin hemolytic anemia. He has a positive coombs test for complement but negative for IGG. retic count is elevated, haptoglobin <10 and cold agglutinin titer is 1:256. Anemia has remained stable at 10. CT scans show no malignancy or lymphoma. HIV, hep B and C testing negative. ANA negative. RF negative. No monoclonal protein noted on myeloma panel. Complement levels low normal. All this is consistent with cold agglutinin hemolytic anemia which may be due to preceding subclinical infection. Discussed etiology of CAHA with patient and his HCP.  Discussed the need to keep patient warm at all times even if it is hot inside. Increase AC temperature indoors so that patient does not feel cold indoors. Cold temperature can precipitate cold agglutinin and make anemia worse. Wear warm clothing indoors.   His anemia is not worse as compared to 2 weeks ago. Watchful monitoring is recommended. Drugs such as rituxan can be used if anemia continuies to worsen and his titers are worse. Hopefully it should resolve on its own in a few weeks to months  rtc in 3 weeks with cbc, retic count, cmp and haptoglobin. Will also check 24 hour urine free light chains. Hold off on bone marrow biopsy at this time   Visit Diagnosis 1. 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[place])      Dr. Randa Evens, MD, MPH [facility] Pager- [phone] 10/30/2016 10:59 AM

## 2016-10-30 NOTE — Telephone Encounter (Signed)
Called pt to schedule AWV. NO answer, NO VM.  NOTE* Never had AWV before

## 2016-10-30 NOTE — Progress Notes (Signed)
Patient offers no complaints today. 

## 2016-10-31 LAB — COLD AGGLUTININ TITER: Cold Agglutinin Titer: 1:256 {titer}

## 2016-10-31 LAB — DAT, POLYSPECIFIC AHG (ARMC ONLY)
DAT, IgG: NEGATIVE
DAT, complement: POSITIVE
Polyspecific AHG test: POSITIVE

## 2016-10-31 LAB — HAPTOGLOBIN: Haptoglobin: <10 mg/dL — ABNORMAL LOW (ref 34–200)

## 2016-11-01 ENCOUNTER — Encounter: Payer: Self-pay | Admitting: Oncology

## 2016-11-22 ENCOUNTER — Inpatient Hospital Stay: Payer: Medicare Other

## 2016-11-22 ENCOUNTER — Encounter: Payer: Self-pay | Admitting: Oncology

## 2016-11-22 ENCOUNTER — Inpatient Hospital Stay (HOSPITAL_BASED_OUTPATIENT_CLINIC_OR_DEPARTMENT_OTHER): Payer: Medicare Other | Admitting: Oncology

## 2016-11-22 DIAGNOSIS — D5912 Cold autoimmune hemolytic anemia: Secondary | ICD-10-CM | POA: Insufficient documentation

## 2016-11-22 DIAGNOSIS — D591 Other autoimmune hemolytic anemias: Secondary | ICD-10-CM

## 2016-11-22 DIAGNOSIS — D891 Cryoglobulinemia: Secondary | ICD-10-CM

## 2016-11-22 DIAGNOSIS — L723 Sebaceous cyst: Secondary | ICD-10-CM | POA: Diagnosis not present

## 2016-11-22 DIAGNOSIS — I700 Atherosclerosis of aorta: Secondary | ICD-10-CM | POA: Diagnosis not present

## 2016-11-22 DIAGNOSIS — D649 Anemia, unspecified: Secondary | ICD-10-CM

## 2016-11-22 DIAGNOSIS — G35 Multiple sclerosis: Secondary | ICD-10-CM | POA: Diagnosis not present

## 2016-11-22 DIAGNOSIS — E7800 Pure hypercholesterolemia, unspecified: Secondary | ICD-10-CM | POA: Diagnosis not present

## 2016-11-22 DIAGNOSIS — I10 Essential (primary) hypertension: Secondary | ICD-10-CM | POA: Diagnosis not present

## 2016-11-22 LAB — COMPREHENSIVE METABOLIC PANEL
ALBUMIN: 4 g/dL (ref 3.5–5.0)
ALK PHOS: 100 U/L (ref 38–126)
ALT: 21 U/L (ref 17–63)
ANION GAP: 5 (ref 5–15)
AST: 23 U/L (ref 15–41)
BILIRUBIN TOTAL: 2.3 mg/dL — AB (ref 0.3–1.2)
BUN: 16 mg/dL (ref 6–20)
CALCIUM: 9.1 mg/dL (ref 8.9–10.3)
CO2: 28 mmol/L (ref 22–32)
CREATININE: 1.24 mg/dL (ref 0.61–1.24)
Chloride: 103 mmol/L (ref 101–111)
GFR calc Af Amer: >60 mL/min (ref >60)
GFR calc non Af Amer: 58 mL/min — ABNORMAL LOW (ref >60)
GLUCOSE: 109 mg/dL — AB (ref 65–99)
Potassium: 3.5 mmol/L (ref 3.5–5.1)
Sodium: 136 mmol/L (ref 135–145)
TOTAL PROTEIN: 6.6 g/dL (ref 6.5–8.1)

## 2016-11-22 LAB — CBC WITH DIFFERENTIAL/PLATELET
BASOS PCT: 1 %
Basophils Absolute: 0.1 10*3/uL (ref 0.0–0.1)
Eosinophils Absolute: 0 10*3/uL (ref 0.0–0.7)
Eosinophils Relative: 0 %
HEMATOCRIT: 29.9 % — AB (ref 39.0–52.0)
HEMOGLOBIN: 11 g/dL — AB (ref 13.0–17.0)
Lymphocytes Relative: 15 %
Lymphs Abs: 1 10*3/uL (ref 0.7–4.0)
MCH: 32.9 pg (ref 26.0–34.0)
MCHC: 36.6 g/dL — AB (ref 30.0–36.0)
MCV: 90 fL (ref 78.0–100.0)
MONOS PCT: 11 %
Monocytes Absolute: 0.8 10*3/uL (ref 0.1–1.0)
NEUTROS ABS: 4.8 10*3/uL (ref 1.7–7.7)
NEUTROS PCT: 72 %
Platelets: 337 10*3/uL (ref 150–400)
RBC: 3.33 MIL/uL — AB (ref 4.22–5.81)
RDW: 13 % (ref 11.5–15.5)
WBC: 6.3 10*3/uL (ref 4.0–10.5)

## 2016-11-22 NOTE — Progress Notes (Signed)
Hematology/Oncology Consult note [facility]  Telephone:([phone] [phone]  Patient Care Team: Einar Pheasant, MD as PCP - General (Internal Medicine)   Name of the patient: Philip Gordon  [id number]  [birth year]   Date of visit: 11/22/16  Diagnosis- cold agglutinin hemolytic anemia  Chief complaint/ Reason for visit- discuss results of blood work and scans  Heme/Onc history: patient is a 66 year old male who sees [name] for multiple sclerosis. He takes aricept.   CBC from 09/04/16 showed a wbc of 7.9, H&H of 10.5/30.8 with an MCV of 97.2 and a platelet count of 396. At that time cold agglutinin/cryoglobulin was suspected as B12, TSH, and hepatic function panel was normal. Iron studies showed an increased iron saturation of 61.5%. Ferritin was 175 and folate was within normal limits.   Blood work from 10/11/2016 showed wbc of 6.9, H&H 10.1/27.8 and a platelet count of 331. Iron studies were within normal limits. CMP was significant for elevated creatinine of 1.3 and elevated total bilirubin of 2.8.Marland Kitchen ANA was negative and C3 and C4 were within normal limits. HIV antibody was negative. Hepatitis B and C testing was negative. Multiple myeloma panel revealed no monoclonal protein. Rheumatoid factor was negative and ESR was mildly elevated at 23. CRP was elevated at 3.1. Haptoglobin was less than 10. Reticulocyte count was normal at 2.7%.  Review of peripheral blood smear revealed prominent RBC agglutination. 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  Patient has secondary progressive MS diagnosed in 1980 now gradually getting worse. He sees [name] for this. He lives alone and uses walker for ambulation. He wears braces on his legs and requires on health aids for assistance with ADLs. He is accompanied by his family  members who have concerns for his gradual decline in mobility and independence. He works with therapy and he has previously declined assisted living.   Interval history- Today he is accompanied by his brother and sister in law. He continues to live alone with home assistance. He complains of feeling cold but has adjusted home thermostat to 77 degrees. Additionally he complains of fatigue which is chronic and unchanged.   ECOG PS- 2-3 Pain scale- 0   Review of systems- Review of Systems  Constitutional: Positive for malaise/fatigue. Negative for chills, fever and weight loss.  HENT: Negative for congestion, ear discharge and nosebleeds.   Eyes: Negative for blurred vision.  Respiratory: Negative for cough, hemoptysis, sputum production, shortness of breath and wheezing.   Cardiovascular: Negative for chest pain, palpitations, orthopnea and claudication.  Gastrointestinal: Negative for abdominal pain, blood in stool, constipation, diarrhea, heartburn, melena, nausea and vomiting.  Genitourinary: Negative for dysuria, flank pain, frequency, hematuria and urgency.  Musculoskeletal: Positive for myalgias. Negative for back pain and joint pain.  Skin: Negative for rash.  Neurological: Positive for weakness. Negative for dizziness, tingling, focal weakness, seizures and headaches.  Endo/Heme/Allergies: Does not bruise/bleed easily.  Psychiatric/Behavioral: Negative for depression and suicidal ideas. The patient does not have insomnia.     No Known Allergies  Past Medical History:  Diagnosis Date  . Depression   . Hypercholesterolemia   . Hypertension   . Multiple sclerosis (HCC)    Sees Dr Ala Bent [facility])  . Prostate cancer [facility])    Followed by Dr [name] and Dr [name]    Past Surgical History:  Procedure Laterality Date  . KNEE SURGERY  Social History   Social History  . Marital status: Divorced    Spouse name: N/A  . Number of children: N/A  . Years of education: N/A     Occupational History  . Not on file.   Social History Main Topics  . Smoking status: Never Smoker  . Smokeless tobacco: Current User    Types: Chew     Comment: Is going to try and cut down on the amount.   . Alcohol use No  . Drug use: No  . Sexual activity: Not on file   Other Topics Concern  . Not on file   Social History Narrative  . No narrative on file    Family History  Problem Relation Age of Onset  . Hypertension Mother   . Stroke Father   . Diabetes Maternal Grandmother   . Diabetes Maternal Grandfather   . Leukemia Sister        died - age 4    Current Outpatient Prescriptions:  .  amLODipine (NORVASC) 5 MG tablet, Take 1 tablet (5 mg total) by mouth daily., Disp: 90 tablet, Rfl: 0 .  donepezil (ARICEPT) 10 MG tablet, TAKE 1/2 TABLET DAILY FOR THEN FIRST MONTH THEN TAKE ONE TABLET DAILY, Disp: , Rfl: 5 .  finasteride (PROSCAR) 5 MG tablet, Take 1 tablet (5 mg total) by mouth daily., Disp: 90 tablet, Rfl: 0 .  hydrochlorothiazide (MICROZIDE) 12.5 MG capsule, TAKE 1 CAPSULE (12.5 MG TOTAL) BY MOUTH DAILY., Disp: 90 capsule, Rfl: 0 .  mirtazapine (REMERON) 15 MG tablet, TAKE 1/2 TABLET BY MOUTH AT BEDTIME, Disp: 15 tablet, Rfl: 4 .  tiZANidine (ZANAFLEX) 4 MG tablet, TAKE 1 TABLET BY MOUTH NIGHTLY, Disp: 30 tablet, Rfl: 4 .  vitamin B-12 (CYANOCOBALAMIN) 1000 MCG tablet, Take 1 tablet (1,000 mcg total) by mouth daily., Disp: 90 tablet, Rfl: 3 .  azelastine (ASTELIN) 0.1 % nasal spray, Place 1 spray into both nostrils 2 (two) times daily. Use in each nostril as directed (Patient not taking: Reported on 11/22/2016), Disp: 30 mL, Rfl: 1 .  ezetimibe (ZETIA) 10 MG tablet, Take 1 tablet (10 mg total) by mouth daily. (Patient not taking: Reported on 11/22/2016), Disp: 90 tablet, Rfl: 0  Physical exam:  Vitals:   11/22/16 1400  BP: (!) 144/71  Pulse: 71  Resp: 18  Temp: 97.6 F (36.4 C)  TempSrc: Tympanic  Weight: 146 lb 1.6 oz (66.3 kg)   Physical Exam   Constitutional: He is oriented to person, place, and time.  Thin, frail man sitting in a wheelchair. Appears fatigued.  HENT:  Head: Normocephalic and atraumatic.  Eyes: Pupils are equal, round, and reactive to light. EOM are normal. Scleral icterus: mild.  Neck: Normal range of motion.  Cardiovascular: Normal rate, regular rhythm and normal heart sounds.   No murmur heard. Pulmonary/Chest: Effort normal and breath sounds normal.  Abdominal: Soft. Bowel sounds are normal. There is tenderness.  No palpable splenomegaly  Lymphadenopathy:    He has no cervical adenopathy.  Neurological: He is alert and oriented to person, place, and time.  Braces in place for b/l LE. Memory impairment noted  Skin: Skin is warm and dry.     CMP Latest Ref Rng & Units 11/22/2016  Glucose 65 - 99 mg/dL 109(H)  BUN 6 - 20 mg/dL 16  Creatinine 0.61 - 1.24 mg/dL 1.24  Sodium 135 - 145 mmol/L 136  Potassium 3.5 - 5.1 mmol/L 3.5  Chloride 101 - 111 mmol/L 103  CO2   22 - 32 mmol/L 28  Calcium 8.9 - 10.3 mg/dL 9.1  Total Protein 6.5 - 8.1 g/dL 6.6  Total Bilirubin 0.3 - 1.2 mg/dL 2.3(H)  Alkaline Phos 38 - 126 U/L 100  AST 15 - 41 U/L 23  ALT 17 - 63 U/L 21   CBC Latest Ref Rng & Units 11/22/2016  WBC 4.0 - 10.5 K/uL 6.3  Hemoglobin 13.0 - 17.0 g/dL 11.0(L)  Hematocrit 39.0 - 52.0 % 29.9(L)  Platelets 150 - 400 K/uL 337    No images are attached to the encounter.  No results found.   Assessment and plan- Patient is a 66 y.o. male with cold agglutinin hemolytic anemia  Blood work consistent with cold agglutinin hemolytic anemia. He has a positive coombs test for complement but negative for IGG. retic count is elevated, haptoglobin <10 and cold agglutinin titer is 1:256.  CT scans show no malignancy or lymphoma. HIV, hep B and C testing negative. ANA negative. RF negative. No monoclonal protein noted on myeloma panel. Complement levels low normal. All this is consistent with cold agglutinin hemolytic  anemia which may have been related to a preceding subclinical infection. Again discussed the etiology of CAHA with patient and his family members. He is making improvements at keeping his home warm inside and avoiding cold temperatures as cold temperature can make anemia agglutinin worse.   Anemia has improved compared to previous visit to 11. Given his condition, we will watchfully monitor him and may consider rituxan if his anemia worsens or his titers worsen. It appears that it is resolving. Will plan to have him return to clinic in 4 weeks for labs (cbc, retic, cmp, hamptoglobin)  rtc in 3 weeks with cbc, retic count, cmp and haptoglobin. Will also check 24 hour urine free light chains. Hold off on bone marrow biopsy at this time   Visit Diagnosis 1. 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[place])    Beckey Rutter, 11/22/16 3:48 PM  Dr. Randa Evens, MD, MPH [place] [facility] Pager- [phone] 11/22/2016 3:39 PM

## 2016-11-22 NOTE — Progress Notes (Signed)
Here in w/c with family. Per family pt has care giver 15 h per week -they follow up and leave meals. Also has meals on week.

## 2016-12-05 NOTE — Telephone Encounter (Signed)
Spoke to pt. He declined AWV at this time. He has a lot going on right now. He may decide to schedule in 2019

## 2016-12-20 ENCOUNTER — Inpatient Hospital Stay: Payer: Medicare Other | Attending: Oncology

## 2016-12-20 ENCOUNTER — Other Ambulatory Visit
Admission: RE | Admit: 2016-12-20 | Discharge: 2016-12-20 | Disposition: A | Discharge status: Home / Self Care | Payer: Medicare Other | Source: Ambulatory Visit | Attending: Oncology | Admitting: Oncology

## 2016-12-20 DIAGNOSIS — D591 Other autoimmune hemolytic anemias: Secondary | ICD-10-CM | POA: Diagnosis not present

## 2016-12-20 DIAGNOSIS — D5912 Cold autoimmune hemolytic anemia: Secondary | ICD-10-CM

## 2016-12-20 LAB — COMPREHENSIVE METABOLIC PANEL
ALBUMIN: 4.1 g/dL (ref 3.5–5.0)
ALT: 17 U/L (ref 17–63)
ANION GAP: 10 (ref 5–15)
AST: 20 U/L (ref 15–41)
Alkaline Phosphatase: 96 U/L (ref 38–126)
BUN: 16 mg/dL (ref 6–20)
CO2: 28 mmol/L (ref 22–32)
Calcium: 9.2 mg/dL (ref 8.9–10.3)
Chloride: 102 mmol/L (ref 101–111)
Creatinine, Ser: 1.19 mg/dL (ref 0.61–1.24)
GFR calc Af Amer: >60 mL/min (ref >60)
GFR calc non Af Amer: >60 mL/min (ref >60)
GLUCOSE: 90 mg/dL (ref 65–99)
POTASSIUM: 3.7 mmol/L (ref 3.5–5.1)
SODIUM: 140 mmol/L (ref 135–145)
TOTAL PROTEIN: 6.8 g/dL (ref 6.5–8.1)
Total Bilirubin: 2.4 mg/dL — ABNORMAL HIGH (ref 0.3–1.2)

## 2016-12-20 LAB — CBC WITH DIFFERENTIAL/PLATELET
BASOS ABS: 0.1 10*3/uL (ref 0–0.1)
Basophils Relative: 1 %
EOS PCT: 1 %
Eosinophils Absolute: 0.1 10*3/uL (ref 0–0.7)
HCT: 31.3 % — ABNORMAL LOW (ref 40.0–52.0)
Hemoglobin: 12 g/dL — ABNORMAL LOW (ref 13.0–18.0)
LYMPHS PCT: 17 %
Lymphs Abs: 1.1 10*3/uL (ref 1.0–3.6)
MCH: 42 pg — ABNORMAL HIGH (ref 26.0–34.0)
MCHC: 36.9 g/dL — ABNORMAL HIGH (ref 32.0–36.0)
MCV: 109.6 fL — AB (ref 80.0–100.0)
Monocytes Absolute: 0.7 10*3/uL (ref 0.2–1.0)
Monocytes Relative: 11 %
NEUTROS ABS: 4.2 10*3/uL (ref 1.4–6.5)
Neutrophils Relative %: 70 %
PLATELETS: 325 10*3/uL (ref 150–440)
RBC: 2.85 MIL/uL — AB (ref 4.40–5.90)
RDW: 13.6 % (ref 11.5–14.5)
WBC: 6.1 10*3/uL (ref 3.8–10.6)

## 2016-12-20 LAB — RETICULOCYTES
RBC.: 2.85 MIL/uL — ABNORMAL LOW (ref 4.40–5.90)
RETIC COUNT ABSOLUTE: 94.1 10*3/uL (ref 19.0–183.0)
Retic Ct Pct: 3.3 % — ABNORMAL HIGH (ref 0.4–3.1)

## 2016-12-21 ENCOUNTER — Telehealth: Payer: Self-pay | Admitting: *Deleted

## 2016-12-21 LAB — HAPTOGLOBIN: Haptoglobin: <10 mg/dL — ABNORMAL LOW (ref 34–200)

## 2016-12-21 NOTE — Telephone Encounter (Signed)
-----   Message from Sindy Guadeloupe, MD sent at 12/21/2016  7:45 AM EDT ----- Please let patient know that he is still hemolyzing but hemoglobin has improved. This is likely to resolve on its own. No intervention at this point

## 2016-12-21 NOTE — Telephone Encounter (Signed)
[place] and let her know by voicemail that mr. Kataoka is still hemolyzing but hgb improving and [name] feels like it is going to resolve completing on its own.  Please call if you have questions.

## 2017-01-02 ENCOUNTER — Other Ambulatory Visit: Payer: Self-pay

## 2017-01-02 MED ORDER — AMLODIPINE BESYLATE 5 MG PO TABS: 5.0000 mg | ORAL_TABLET | Freq: Every day | ORAL | 0 refills | Status: DC

## 2017-01-02 MED ORDER — HYDROCHLOROTHIAZIDE 12.5 MG PO CAPS: ORAL_CAPSULE | ORAL | 0 refills | Status: DC

## 2017-01-02 MED ORDER — FINASTERIDE 5 MG PO TABS: 5.0000 mg | ORAL_TABLET | Freq: Every day | ORAL | 0 refills | Status: DC

## 2017-01-02 MED ORDER — EZETIMIBE 10 MG PO TABS: 10.0000 mg | ORAL_TABLET | Freq: Every day | ORAL | 0 refills | Status: DC

## 2017-01-17 ENCOUNTER — Inpatient Hospital Stay: Payer: Medicare Other

## 2017-01-17 ENCOUNTER — Inpatient Hospital Stay: Payer: Medicare Other | Admitting: Oncology

## 2017-01-24 ENCOUNTER — Inpatient Hospital Stay: Payer: Medicare Other | Attending: Oncology | Admitting: Oncology

## 2017-01-24 ENCOUNTER — Inpatient Hospital Stay: Payer: Medicare Other

## 2017-01-24 ENCOUNTER — Encounter: Payer: Self-pay | Admitting: Oncology

## 2017-01-24 VITALS — BP 105/66 | HR 60 | Temp 98.0°F | Resp 14

## 2017-01-24 DIAGNOSIS — I10 Essential (primary) hypertension: Secondary | ICD-10-CM | POA: Insufficient documentation

## 2017-01-24 DIAGNOSIS — F329 Major depressive disorder, single episode, unspecified: Secondary | ICD-10-CM | POA: Diagnosis not present

## 2017-01-24 DIAGNOSIS — D591 Other autoimmune hemolytic anemias: Secondary | ICD-10-CM

## 2017-01-24 DIAGNOSIS — Z23 Encounter for immunization: Secondary | ICD-10-CM | POA: Insufficient documentation

## 2017-01-24 DIAGNOSIS — Z8546 Personal history of malignant neoplasm of prostate: Secondary | ICD-10-CM | POA: Insufficient documentation

## 2017-01-24 DIAGNOSIS — G35 Multiple sclerosis: Secondary | ICD-10-CM | POA: Insufficient documentation

## 2017-01-24 DIAGNOSIS — D5912 Cold autoimmune hemolytic anemia: Secondary | ICD-10-CM

## 2017-01-24 DIAGNOSIS — E7800 Pure hypercholesterolemia, unspecified: Secondary | ICD-10-CM | POA: Insufficient documentation

## 2017-01-24 DIAGNOSIS — D891 Cryoglobulinemia: Secondary | ICD-10-CM

## 2017-01-24 DIAGNOSIS — D599 Acquired hemolytic anemia, unspecified: Secondary | ICD-10-CM

## 2017-01-24 DIAGNOSIS — D649 Anemia, unspecified: Secondary | ICD-10-CM

## 2017-01-24 LAB — RETICULOCYTES
RBC.: 2.86 MIL/uL — ABNORMAL LOW (ref 4.40–5.90)
RETIC CT PCT: 3.3 % — AB (ref 0.4–3.1)
Retic Count, Absolute: 94.4 10*3/uL (ref 19.0–183.0)

## 2017-01-24 LAB — CBC WITH DIFFERENTIAL/PLATELET
BASOS PCT: 0 %
Band Neutrophils: 1 %
Basophils Absolute: 0 10*3/uL (ref 0–0.1)
Blasts: 0 %
EOS PCT: 0 %
Eosinophils Absolute: 0 10*3/uL (ref 0–0.7)
HCT: 26.5 % — ABNORMAL LOW (ref 40.0–52.0)
Hemoglobin: 8.5 g/dL — ABNORMAL LOW (ref 13.0–18.0)
LYMPHS ABS: 1.1 10*3/uL (ref 1.0–3.6)
LYMPHS PCT: 20 %
MCH: 29.7 pg (ref 26.0–34.0)
MCHC: 32.1 g/dL (ref 32.0–36.0)
MCV: 92.5 fL (ref 80.0–100.0)
MONO ABS: 0.5 10*3/uL (ref 0.2–1.0)
MONOS PCT: 9 %
MYELOCYTES: 0 %
Metamyelocytes Relative: 0 %
NEUTROS ABS: 3.8 10*3/uL (ref 1.4–6.5)
NEUTROS PCT: 70 %
NRBC: 0 /100{WBCs}
OTHER: 0 %
PLATELETS: 446 10*3/uL — AB (ref 150–440)
Promyelocytes Absolute: 0 %
RBC: 2.86 MIL/uL — ABNORMAL LOW (ref 4.40–5.90)
RDW: 16.1 % — AB (ref 11.5–14.5)
WBC: 5.4 10*3/uL (ref 3.8–10.6)

## 2017-01-24 LAB — COMPREHENSIVE METABOLIC PANEL
ALK PHOS: 89 U/L (ref 38–126)
ALT: 13 U/L — AB (ref 17–63)
ANION GAP: 8 (ref 5–15)
AST: 20 U/L (ref 15–41)
Albumin: 4 g/dL (ref 3.5–5.0)
BILIRUBIN TOTAL: 2.7 mg/dL — AB (ref 0.3–1.2)
BUN: 17 mg/dL (ref 6–20)
CALCIUM: 9.1 mg/dL (ref 8.9–10.3)
CO2: 25 mmol/L (ref 22–32)
CREATININE: 1.29 mg/dL — AB (ref 0.61–1.24)
Chloride: 102 mmol/L (ref 101–111)
GFR calc non Af Amer: 55 mL/min — ABNORMAL LOW (ref >60)
Glucose, Bld: 98 mg/dL (ref 65–99)
Potassium: 3.3 mmol/L — ABNORMAL LOW (ref 3.5–5.1)
Sodium: 135 mmol/L (ref 135–145)
TOTAL PROTEIN: 6.6 g/dL (ref 6.5–8.1)

## 2017-01-24 MED ORDER — FOLIC ACID 1 MG PO TABS: 1.0000 mg | ORAL_TABLET | Freq: Every day | ORAL | 3 refills | Status: DC

## 2017-01-24 MED ORDER — INFLUENZA VAC SPLIT HIGH-DOSE 0.5 ML IM SUSY
0.5000 mL | PREFILLED_SYRINGE | Freq: Once | INTRAMUSCULAR | Status: AC
  Administered 2017-01-24: 0.5 mL via INTRAMUSCULAR
  Filled 2017-01-24: qty 0.5

## 2017-01-24 NOTE — Progress Notes (Signed)
Patient is here for follow up with labs today. He states that he is feeling well today and denies having any pain. He wants the high dose flu vaccine today.

## 2017-01-25 LAB — HAPTOGLOBIN: Haptoglobin: <10 mg/dL — ABNORMAL LOW (ref 34–200)

## 2017-01-28 ENCOUNTER — Other Ambulatory Visit: Payer: Self-pay | Admitting: *Deleted

## 2017-01-28 ENCOUNTER — Telehealth: Payer: Self-pay | Admitting: *Deleted

## 2017-01-28 DIAGNOSIS — D591 Other autoimmune hemolytic anemias: Principal | ICD-10-CM

## 2017-01-28 DIAGNOSIS — D5912 Cold autoimmune hemolytic anemia: Secondary | ICD-10-CM

## 2017-01-28 NOTE — Telephone Encounter (Signed)
Left VM message for Mr. Philip Gordon to call me back about patient's lab results.     dhs

## 2017-01-28 NOTE — Telephone Encounter (Signed)
-----   Message from Luella Cook, RN sent at 01/28/2017  9:31 AM EST -----   ----- Message ----- From: Sindy Guadeloupe, MD Sent: 01/25/2017   9:37 AM To: Luella Cook, RN  He is more anemic. He is likely hemolyzing more. Please let patients caregivers know that it is very important for him to layer up during winter to prevent hemolysis. We should repeat 1 month and at 2 months as well in addition to 3.

## 2017-01-28 NOTE — Progress Notes (Signed)
Hematology/Oncology Consult note [facility]  [phone] [phone]  Patient Care Team: Einar Pheasant, MD as PCP - General (Internal Medicine)   Name of the patient: Philip Gordon  [id number]  1950-11-19   Date of visit: 01/28/17  Diagnosis- cold agglutinin hemolytic anemia   Chief complaint/ Reason for visit- routine f/u of hemolytic anemia  Heme/Onc history: patient is a 66 year old male who sees [name] for multiple sclerosis. He is currently on prednisone taper and also takes Aricept. Recent CBC from 09/04/2016 showed white count of 7.9, H&H of 10.5/30.8 with an MCV of 97.2 and a platelet count of 396. Cold agglutinin/cryoglobulin was susrecent B12, TSH and hepatic function panel was normal. iron study showed an increased iron saturation of 61.5%. Ferritin was 175 and folate was within normal limits.   Patient has secondary progressive MS diagnosed in 1980 now gradually getting worse. He lives alone and uses walker for ambulation. He thinks he is doing well staying alone. But his guardians think otherwise and feel he is failing slowly and needs more help  Results of bloodwork from 10/11/2016 were as follows: CBC showed white count of 6.9, H&H of 10.1/27.8 and a platelet count of 331. Iron studies were within normal limits. CMP was significant for elevated creatinine of 1.3 and elevated total bilirubin of 2.8.Marland Kitchen ANA was negative and C3 and C4 were within normal limits. HIV antibody was negative. Hepatitis B and C testing was negative. Multiple myeloma panel revealed no monoclonal protein. Rheumatoid factor was negative and ESR was mildly elevated at 23. CRP was elevated at 3.1. Haptoglobin was less than 10. Reticulocyte count was normal at 2.7%.  Review of peripheral blood smear revealed prominent RBC agglutination. 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  CT chest abdomen and pelvis without contrast did not reveal any evidence of adenopathy or malignancy.  Patient has been on observation for his hemolytic anemia and his hemoglobin has remained stable around 10 but with evidence of active hemolysis  Interval history- Patient is here today with his health care provider who states that his MS is slowly worsening. He tries to keep himself warm but difficult to ascertain  ECOG PS- 2-3 Pain scale- 0   Review of systems- Review of Systems  Constitutional: Positive for malaise/fatigue. Negative for chills, fever and weight loss.  HENT: Negative for congestion, ear discharge and nosebleeds.   Eyes: Negative for blurred vision.  Respiratory: Negative for cough, hemoptysis, sputum production, shortness of breath and wheezing.   Cardiovascular: Negative for chest pain, palpitations, orthopnea and claudication.  Gastrointestinal: Negative for abdominal pain, blood in stool, constipation, diarrhea, heartburn, melena, nausea and vomiting.  Genitourinary: Negative for dysuria, flank pain, frequency, hematuria and urgency.  Musculoskeletal: Negative for back pain, joint pain and myalgias.  Skin: Negative for rash.  Neurological: Negative for dizziness, tingling, focal weakness, seizures, weakness and headaches.  Endo/Heme/Allergies: Does not bruise/bleed easily.  Psychiatric/Behavioral: Negative for depression and suicidal ideas. The patient does not have insomnia.       No Known Allergies   Past Medical History:  Diagnosis Date  . Depression   . Hypercholesterolemia   . Hypertension   . Multiple sclerosis (HCC)    Sees Dr Ala Bent [facility])  . Prostate cancer [facility])    Followed by Dr [name] and Dr [name]     Past Surgical History:  Procedure Laterality Date  .  KNEE SURGERY      Social History   Socioeconomic History  . Marital status: Divorced    Spouse name: Not on file    . Number of children: Not on file  . Years of education: Not on file  . Highest education level: Not on file  Social Needs  . Financial resource strain: Not on file  . Food insecurity - worry: Not on file  . Food insecurity - inability: Not on file  . Transportation needs - medical: Not on file  . Transportation needs - non-medical: Not on file  Occupational History  . Not on file  Tobacco Use  . Smoking status: Never Smoker  . Smokeless tobacco: Current User    Types: Chew  . Tobacco comment: Is going to try and cut down on the amount.   Substance and Sexual Activity  . Alcohol use: No    Alcohol/week: 1.2 oz    Types: 2 Cans of beer per week  . Drug use: No  . Sexual activity: Not on file  Other Topics Concern  . Not on file  Social History Narrative  . Not on file    Family History  Problem Relation Age of Onset  . Hypertension Mother   . Stroke Father   . Diabetes Maternal Grandmother   . Diabetes Maternal Grandfather   . Leukemia Sister        died - age 42     Current Outpatient Medications:  .  amLODipine (NORVASC) 5 MG tablet, Take 1 tablet (5 mg total) by mouth daily., Disp: 90 tablet, Rfl: 0 .  donepezil (ARICEPT) 10 MG tablet, TAKE 1/2 TABLET DAILY FOR THEN FIRST MONTH THEN TAKE ONE TABLET DAILY, Disp: , Rfl: 5 .  ezetimibe (ZETIA) 10 MG tablet, Take 1 tablet (10 mg total) by mouth daily., Disp: 90 tablet, Rfl: 0 .  finasteride (PROSCAR) 5 MG tablet, Take 1 tablet (5 mg total) by mouth daily., Disp: 90 tablet, Rfl: 0 .  hydrochlorothiazide (MICROZIDE) 12.5 MG capsule, TAKE 1 CAPSULE (12.5 MG TOTAL) BY MOUTH DAILY., Disp: 90 capsule, Rfl: 0 .  mirtazapine (REMERON) 15 MG tablet, TAKE 1/2 TABLET BY MOUTH AT BEDTIME (Patient taking differently: Takes one tablet at bedtime), Disp: 15 tablet, Rfl: 4 .  tiZANidine (ZANAFLEX) 4 MG tablet, TAKE 1 TABLET BY MOUTH NIGHTLY, Disp: 30 tablet, Rfl: 4 .  vitamin B-12 (CYANOCOBALAMIN) 1000 MCG tablet, Take 1 tablet (1,000  mcg total) by mouth daily., Disp: 90 tablet, Rfl: 3 .  folic acid (FOLVITE) 1 MG tablet, Take 1 tablet (1 mg total) by mouth daily., Disp: 30 tablet, Rfl: 3  Physical exam:  Vitals:   01/24/17 1529  BP: 105/66  Pulse: 60  Resp: 14  Temp: 98 F (36.7 C)  TempSrc: Tympanic   Physical Exam  Constitutional: He is oriented to person, place, and time.  Thin frail gentleman sitting in a wheelchair  HENT:  Head: Normocephalic and atraumatic.  Eyes: EOM are normal. Pupils are equal, round, and reactive to light.  Neck: Normal range of motion.  Cardiovascular: Normal rate, regular rhythm and normal heart sounds.  Pulmonary/Chest: Effort normal and breath sounds normal.  Abdominal: Soft. Bowel sounds are normal.  Musculoskeletal:  B/l leg braces  Neurological: He is alert and oriented to person, place, and time.  Skin: Skin is warm and dry.     CMP Latest Ref Rng & Units 01/24/2017  Glucose 65 - 99 mg/dL 98  BUN 6 - 20  mg/dL 17  Creatinine 0.61 - 1.24 mg/dL 1.29(H)  Sodium 135 - 145 mmol/L 135  Potassium 3.5 - 5.1 mmol/L 3.3(L)  Chloride 101 - 111 mmol/L 102  CO2 22 - 32 mmol/L 25  Calcium 8.9 - 10.3 mg/dL 9.1  Total Protein 6.5 - 8.1 g/dL 6.6  Total Bilirubin 0.3 - 1.2 mg/dL 2.7(H)  Alkaline Phos 38 - 126 U/L 89  AST 15 - 41 U/L 20  ALT 17 - 63 U/L 13(L)   CBC Latest Ref Rng & Units 01/24/2017  WBC 3.8 - 10.6 K/uL 5.4  Hemoglobin 13.0 - 18.0 g/dL 8.5(L)  Hematocrit 40.0 - 52.0 % 26.5(L)  Platelets 150 - 440 K/uL 446(H)      Assessment and plan- Patient is a 66 y.o. male with cold agglutinin hemolytic anemia currently on observation  Today his hemoglobin is 8.5 which is lower than his baseline of 10. Bilirubin still elevated at 2.7 and haptoglobin <10 indicating active hemolysis. I have again encouraged the patient to stay as warm as possible as colder climate will increase hemolysis. His overall performance status is poor due to underlying MS. I would therefore hold off on  starting rituxan at this point unless hemoglobin closer to 7 requiring blood transfusion. I will check cbc with diff monthly for next 3 months. Patient and family would like to hold off on frequent doctor visits  If hemoglobin stabilizes around 10 over next 3 months, I will see him abck in 6 months. If hemoglobin remaisn low or starts trending down further, I will see him sooner   Visit Diagnosis 1. 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 Acquired hemolytic anemia (HCC)      Dr. Randa Evens, MD, MPH [facility] at [facility] Pager- [phone] 01/28/2017 11:01 AM

## 2017-01-28 NOTE — Telephone Encounter (Signed)
Spoke with Mrs. Philip Gordon via telephone and explained that per Dr. Elroy Channel advice, they need to keep Philip Gordon warm and bundle him up especially when it is cold. Also he needs labs checked in one and two months. She said she would talk to him and try to get him to bundle up, but that because of neuropathy in his shoulders, he doesn't like to wear a shirt when he is at home. She will explain the importance of keeping warm to Philip Gordon. She will call us back if anything changes.   dhs

## 2017-01-30 ENCOUNTER — Other Ambulatory Visit: Payer: Self-pay | Admitting: *Deleted

## 2017-01-30 MED ORDER — FOLIC ACID 1 MG PO TABS: 1.0000 mg | ORAL_TABLET | Freq: Every day | ORAL | 1 refills | Status: DC

## 2017-01-30 NOTE — Telephone Encounter (Signed)
Prescription was sent to wrong pharmacy, needed to go to [employer] order. Prescription resubmitted

## 2017-02-20 ENCOUNTER — Telehealth: Payer: Self-pay | Admitting: Internal Medicine

## 2017-02-20 MED ORDER — AMLODIPINE BESYLATE 5 MG PO TABS: 5.0000 mg | ORAL_TABLET | Freq: Every day | ORAL | 0 refills | Status: DC

## 2017-02-20 MED ORDER — HYDROCHLOROTHIAZIDE 12.5 MG PO CAPS: ORAL_CAPSULE | ORAL | 0 refills | Status: DC

## 2017-02-20 NOTE — Telephone Encounter (Signed)
Copied from [place]. Topic: Inquiry >> Feb 20, 2017 12:23 PM Neva Seat wrote:  Pt needs the following medications below filled.  He call the pharmacy and they said they couldn't reach anyone to fill pt's Rx's.  Mail order [place], [place]   Hydrochiorothiazide 12.5mg  - 10 pills left Amiditine 5mg  - 10 pills left  Please call pt to let him know they were filled.

## 2017-02-26 ENCOUNTER — Inpatient Hospital Stay: Payer: Medicare Other | Attending: Oncology

## 2017-02-26 DIAGNOSIS — E7800 Pure hypercholesterolemia, unspecified: Secondary | ICD-10-CM | POA: Insufficient documentation

## 2017-02-26 DIAGNOSIS — F329 Major depressive disorder, single episode, unspecified: Secondary | ICD-10-CM | POA: Insufficient documentation

## 2017-02-26 DIAGNOSIS — D5912 Cold autoimmune hemolytic anemia: Secondary | ICD-10-CM

## 2017-02-26 DIAGNOSIS — D591 Other autoimmune hemolytic anemias: Secondary | ICD-10-CM | POA: Insufficient documentation

## 2017-02-26 DIAGNOSIS — Z8546 Personal history of malignant neoplasm of prostate: Secondary | ICD-10-CM | POA: Insufficient documentation

## 2017-02-26 DIAGNOSIS — G35 Multiple sclerosis: Secondary | ICD-10-CM | POA: Diagnosis not present

## 2017-02-26 DIAGNOSIS — I10 Essential (primary) hypertension: Secondary | ICD-10-CM | POA: Insufficient documentation

## 2017-02-26 LAB — CBC WITH DIFFERENTIAL/PLATELET
BASOS ABS: 0.1 10*3/uL (ref 0.0–0.1)
Basophils Relative: 1 %
Eosinophils Absolute: 0.1 10*3/uL (ref 0.0–0.7)
Eosinophils Relative: 2 %
HEMATOCRIT: 29.6 % — AB (ref 39.0–52.0)
Hemoglobin: 10.6 g/dL — ABNORMAL LOW (ref 13.0–17.0)
LYMPHS ABS: 1.2 10*3/uL (ref 0.7–4.0)
LYMPHS PCT: 18 %
MCH: 32.8 pg (ref 26.0–34.0)
MCHC: 35.7 g/dL (ref 30.0–36.0)
MCV: 91.8 fL (ref 78.0–100.0)
Monocytes Absolute: 0.8 10*3/uL (ref 0.1–1.0)
Monocytes Relative: 12 %
NEUTROS ABS: 4.6 10*3/uL (ref 1.7–7.7)
Neutrophils Relative %: 67 %
PLATELETS: 321 10*3/uL (ref 150–400)
RBC: 3.22 MIL/uL — AB (ref 4.22–5.81)
RDW: 13.5 % (ref 11.5–15.5)
WBC: 6.6 10*3/uL (ref 4.0–10.5)

## 2017-03-12 ENCOUNTER — Telehealth: Payer: Self-pay | Admitting: *Deleted

## 2017-03-12 NOTE — Telephone Encounter (Signed)
Spero Curb informed of physician response

## 2017-03-12 NOTE — Telephone Encounter (Signed)
Hb improved from 8.5 to 10.6. Will not treat cold agglutinin at this time. Continue to monitor

## 2017-03-12 NOTE — Telephone Encounter (Signed)
Asking for results. Any changes ?  Dx:  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[place])   Ref Range & Units 2wk ago  WBC 4.0 - 10.5 K/uL 6.6   RBC 4.22 - 5.81 MIL/uL 3.22 Abnormally low    Comment: CORRECTED FOR COLD AGGLUTININS  Hemoglobin 13.0 - 17.0 g/dL 10.6 Abnormally low    HCT 39.0 - 52.0 % 29.6 Abnormally low    MCV 78.0 - 100.0 fL 91.8   MCH 26.0 - 34.0 pg 32.8   MCHC 30.0 - 36.0 g/dL 35.7   RDW 11.5 - 15.5 % 13.5   Platelets 150 - 400 K/uL 321   Neutrophils Relative % % 67   Neutro Abs 1.7 - 7.7 K/uL 4.6   Lymphocytes Relative % 18   Lymphs Abs 0.7 - 4.0 K/uL 1.2   Monocytes Relative % 12   Monocytes Absolute 0.1 - 1.0 K/uL 0.8   Eosinophils Relative % 2   Eosinophils Absolute 0.0 - 0.7 K/uL 0.1   Basophils Relative % 1   Basophils Absolute 0.0 - 0.1 K/uL 0.1   Resulting Agency  [facility]      Specimen Collected: 02/26/17 14:30 Last Resulted: 02/26/17 16:

## 2017-03-14 ENCOUNTER — Encounter: Payer: Self-pay | Admitting: Internal Medicine

## 2017-03-14 ENCOUNTER — Ambulatory Visit (INDEPENDENT_AMBULATORY_CARE_PROVIDER_SITE_OTHER): Payer: Medicare Other | Admitting: Internal Medicine

## 2017-03-14 VITALS — BP 116/58 | HR 60 | Temp 97.7°F | Ht 67.0 in

## 2017-03-14 DIAGNOSIS — E538 Deficiency of other specified B group vitamins: Secondary | ICD-10-CM

## 2017-03-14 DIAGNOSIS — E7800 Pure hypercholesterolemia, unspecified: Secondary | ICD-10-CM

## 2017-03-14 DIAGNOSIS — Z8546 Personal history of malignant neoplasm of prostate: Secondary | ICD-10-CM

## 2017-03-14 DIAGNOSIS — G35 Multiple sclerosis: Secondary | ICD-10-CM

## 2017-03-14 DIAGNOSIS — I10 Essential (primary) hypertension: Secondary | ICD-10-CM

## 2017-03-14 DIAGNOSIS — D591 Other autoimmune hemolytic anemias: Secondary | ICD-10-CM | POA: Diagnosis not present

## 2017-03-14 DIAGNOSIS — J0110 Acute frontal sinusitis, unspecified: Secondary | ICD-10-CM | POA: Diagnosis not present

## 2017-03-14 DIAGNOSIS — D5912 Cold autoimmune hemolytic anemia: Secondary | ICD-10-CM

## 2017-03-14 MED ORDER — FLUTICASONE PROPIONATE 50 MCG/ACT NA SUSP: 2.0000 | Freq: Every day | NASAL | 6 refills | Status: AC

## 2017-03-14 NOTE — Patient Instructions (Signed)
Saline nasal spray - flush nose at least 2-3x/day  flonase nasal spray - 2 sprays each nostril one time per day.  Do this in the evening.   Continue robitussin.

## 2017-03-14 NOTE — Progress Notes (Signed)
Patient ID: Philip Gordon, male   DOB: March 20, 1951, 66 y.o.   MRN: [id number]   Subjective:    Patient ID: Philip Gordon, male    DOB: Jul 13, 1950, 66 y.o.   MRN: [id number]  HPI  Patient here for a scheduled follow up.  He is accompanied by a friend.  History obtained from both of them.  Reports some increased sinus pressure.  Some congestion.  No chest congestion.  No fever.  Mostly some nasal congestion.  Not taking claritin regularly.  No chest pain.  No sob.  No acid reflux.  No abdominal pain.  Bowels moving.  Seeing hematology for cold agglutinin hemolytic anemia.  Due to have labs drawn soon.  Request to hold on having labs drawn here today and have labs drawn through hematology.  Request to have psa checked with next labs.  Discussed his living situation.  Lives by himself.  Has people who bring him food.  Also, has caretakers come in a few hours a day.  Discussed assisted living.  He declines.  Does not feel he needs.  Ambulates with his walker.     Past Medical History:  Diagnosis Date  . Depression   . Hypercholesterolemia   . Hypertension   . Multiple sclerosis (HCC)    Sees Dr Ala Bent [facility])  . Prostate cancer [facility])    Followed by Dr [name] and Dr [name]   Past Surgical History:  Procedure Laterality Date  . KNEE SURGERY     Family History  Problem Relation Age of Onset  . Hypertension Mother   . Stroke Father   . Diabetes Maternal Grandmother   . Diabetes Maternal Grandfather   . Leukemia Sister        died - age 67   Social History   Socioeconomic History  . Marital status: Divorced    Spouse name: None  . Number of children: None  . Years of education: None  . Highest education level: None  Social Needs  . Financial resource strain: None  . Food insecurity - worry: None  . Food insecurity - inability: None  . Transportation needs - medical: None  . Transportation needs - non-medical: None  Occupational History  . None  Tobacco Use  . Smoking  status: Never Smoker  . Smokeless tobacco: Current User    Types: Chew  . Tobacco comment: Is going to try and cut down on the amount.   Substance and Sexual Activity  . Alcohol use: No    Alcohol/week: 1.2 oz    Types: 2 Cans of beer per week  . Drug use: No  . Sexual activity: None  Other Topics Concern  . None  Social History Narrative  . None    Outpatient Encounter Medications as of 03/14/2017  Medication Sig  . amLODipine (NORVASC) 5 MG tablet Take 1 tablet (5 mg total) by mouth daily.  Marland Kitchen donepezil (ARICEPT) 10 MG tablet Take 1qd  . ezetimibe (ZETIA) 10 MG tablet Take 1 tablet (10 mg total) by mouth daily.  . finasteride (PROSCAR) 5 MG tablet Take 1 tablet (5 mg total) by mouth daily.  . folic acid (FOLVITE) 1 MG tablet Take 1 tablet (1 mg total) daily by mouth.  . hydrochlorothiazide (MICROZIDE) 12.5 MG capsule TAKE 1 CAPSULE (12.5 MG TOTAL) BY MOUTH DAILY.  . mirtazapine (REMERON) 15 MG tablet TAKE 1/2 TABLET BY MOUTH AT BEDTIME (Patient taking differently: Takes one tablet at bedtime)  . tiZANidine (ZANAFLEX) 4 MG  tablet TAKE 1 TABLET BY MOUTH NIGHTLY  . vitamin B-12 (CYANOCOBALAMIN) 1000 MCG tablet Take 1 tablet (1,000 mcg total) by mouth daily.  . fluticasone (FLONASE) 50 MCG/ACT nasal spray Place 2 sprays into both nostrils daily.  . [DISCONTINUED] amLODipine (NORVASC) 5 MG tablet Take 1 tablet (5 mg total) by mouth daily.  . [DISCONTINUED] azelastine (ASTELIN) 0.1 % nasal spray Place 1 spray into both nostrils 2 (two) times daily. Use in each nostril as directed (Patient not taking: Reported on 11/22/2016)  . [DISCONTINUED] hydrochlorothiazide (MICROZIDE) 12.5 MG capsule TAKE 1 CAPSULE (12.5 MG TOTAL) BY MOUTH DAILY.   No facility-administered encounter medications on file as of 03/14/2017.     Review of Systems  Constitutional: Negative for appetite change, fever and unexpected weight change.  HENT: Positive for congestion, postnasal drip and sinus pressure.     Respiratory: Negative for cough, chest tightness and shortness of breath.   Cardiovascular: Negative for chest pain, palpitations and leg swelling.  Gastrointestinal: Negative for abdominal pain, diarrhea, nausea and vomiting.  Genitourinary: Negative for difficulty urinating and dysuria.  Musculoskeletal: Negative for joint swelling and myalgias.  Skin: Negative for color change and rash.  Neurological: Negative for dizziness and headaches.  Psychiatric/Behavioral: Negative for agitation and dysphoric mood.       Objective:    Physical Exam  Constitutional: He appears well-developed and well-nourished. No distress.  HENT:  Mouth/Throat: Oropharynx is clear and moist.  Nares - slightly erythematous turbinates.  No significant tenderness to palpation over the sinuses.    Eyes: Conjunctivae are normal. Right eye exhibits no discharge. Left eye exhibits no discharge.  Neck: Neck supple. No thyromegaly present.  Cardiovascular: Normal rate and regular rhythm.  Pulmonary/Chest: Effort normal and breath sounds normal. No respiratory distress.  Abdominal: Soft. Bowel sounds are normal. There is no tenderness.  Musculoskeletal: He exhibits no edema or tenderness.  Lymphadenopathy:    He has no cervical adenopathy.  Skin: No rash noted. No erythema.  Psychiatric: He has a normal mood and affect. His behavior is normal.    BP (!) 116/58 (BP Location: Left Arm, Patient Position: Sitting, Cuff Size: Normal)   Pulse 60   Temp 97.7 F (36.5 C) (Oral)   Ht 5\' 7"  (1.702 m)   SpO2 99%   BMI 22.88 kg/m  Wt Readings from Last 3 Encounters:  11/22/16 146 lb 1.6 oz (66.3 kg)  10/11/16 146 lb (66.2 kg)  10/18/15 147 lb (66.7 kg)     Lab Results  Component Value Date   WBC 6.6 02/26/2017   HGB 10.6 (L) 02/26/2017   HCT 29.6 (L) 02/26/2017   PLT 321 02/26/2017   GLUCOSE 98 01/24/2017   CHOL 279 (H) 10/18/2015   TRIG 166.0 (H) 10/18/2015   HDL 62.40 10/18/2015   LDLDIRECT 129.6  01/30/2013   LDLCALC 183 (H) 10/18/2015   ALT 13 (L) 01/24/2017   AST 20 01/24/2017   NA 135 01/24/2017   K 3.3 (L) 01/24/2017   CL 102 01/24/2017   CREATININE 1.29 (H) 01/24/2017   BUN 17 01/24/2017   CO2 25 01/24/2017   TSH 1.70 09/04/2016   PSA 0.01 (L) 09/04/2016       Assessment & Plan:   Problem List Items Addressed This Visit    B12 deficiency    Taking oral B12.  Follow.       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  They follow blood counts.  He desires to have  blood drawn at the cancer center.        History of prostate cancer    Was followed by Dr [name].  rx written to have psa checked with next labs through hematology.  He requested - to save trip to urology.        Hypercholesteremia    Unable to take statin medication.  On zetia.  Follow lipid panel.        Hypertension - Primary    Blood pressure under good control.  Continue same medication regimen.  Follow pressures.  Follow metabolic panel.        Multiple sclerosis ([place])    Followed by Dr Manuella Ghazi.  Declines assisted living.  Ambulates with walker.  Continue f/u with neurology.       Sinus infection    Sinus symptoms as outlined.  Do not feel abx warranted at this time.  nasacort nasal spray and saline nasal spray as outlined.  Robitussin as needed.  Follow.  Notify me or be reevaluated if symptoms persist.        Relevant Medications   fluticasone (FLONASE) 50 MCG/ACT nasal spray       Einar Pheasant, MD

## 2017-03-17 ENCOUNTER — Encounter: Payer: Self-pay | Admitting: Internal Medicine

## 2017-03-17 NOTE — Assessment & Plan Note (Signed)
Followed by hematology.  They follow blood counts.  He desires to have blood drawn at the cancer center.

## 2017-03-17 NOTE — Assessment & Plan Note (Signed)
Was followed by Dr [name].  rx written to have psa checked with next labs through hematology.  He requested - to save trip to urology.

## 2017-03-17 NOTE — Assessment & Plan Note (Signed)
Taking oral B12.  Follow.

## 2017-03-17 NOTE — Assessment & Plan Note (Signed)
Blood pressure under good control.  Continue same medication regimen.  Follow pressures.  Follow metabolic panel.   

## 2017-03-17 NOTE — Assessment & Plan Note (Signed)
Sinus symptoms as outlined.  Do not feel abx warranted at this time.  nasacort nasal spray and saline nasal spray as outlined.  Robitussin as needed.  Follow.  Notify me or be reevaluated if symptoms persist.

## 2017-03-17 NOTE — Assessment & Plan Note (Signed)
Unable to take statin medication.  On zetia.  Follow lipid panel.   

## 2017-03-17 NOTE — Assessment & Plan Note (Signed)
Followed by Dr Manuella Ghazi.  Declines assisted living.  Ambulates with walker.  Continue f/u with neurology.

## 2017-03-29 ENCOUNTER — Inpatient Hospital Stay: Payer: Medicare Other

## 2017-03-29 ENCOUNTER — Other Ambulatory Visit: Payer: Medicare Other

## 2017-04-01 ENCOUNTER — Inpatient Hospital Stay: Payer: Medicare Other | Attending: Oncology

## 2017-04-01 DIAGNOSIS — D5912 Cold autoimmune hemolytic anemia: Secondary | ICD-10-CM

## 2017-04-01 DIAGNOSIS — D591 Other autoimmune hemolytic anemias: Secondary | ICD-10-CM | POA: Diagnosis not present

## 2017-04-01 LAB — CBC WITH DIFFERENTIAL/PLATELET
BAND NEUTROPHILS: 0 %
BASOS ABS: 0.1 10*3/uL (ref 0–0.1)
BASOS PCT: 2 %
BLASTS: 0 %
EOS ABS: 0.1 10*3/uL (ref 0–0.7)
Eosinophils Relative: 1 %
HEMATOCRIT: 28.1 % — AB (ref 39.0–52.0)
Hemoglobin: 10.3 g/dL — ABNORMAL LOW (ref 13.0–17.0)
Lymphocytes Relative: 19 %
Lymphs Abs: 1.1 10*3/uL (ref 1.0–3.6)
MCH: 33.7 pg (ref 26.0–34.0)
MCHC: 36.5 g/dL — ABNORMAL HIGH (ref 30.0–36.0)
MCV: 92.3 fL (ref 78.0–100.0)
MONO ABS: 0.6 10*3/uL (ref 0.2–1.0)
Metamyelocytes Relative: 0 %
Monocytes Relative: 10 %
Myelocytes: 0 %
Neutro Abs: 3.7 10*3/uL (ref 1.4–6.5)
Neutrophils Relative %: 68 %
Other: 0 %
PLATELETS: 354 10*3/uL (ref 150–400)
PROMYELOCYTES ABS: 0 %
RBC: 3.05 MIL/uL — ABNORMAL LOW (ref 4.22–5.81)
RDW: 14.4 % (ref 11.5–15.5)
WBC: 5.6 10*3/uL (ref 4.0–10.5)
nRBC: 0 /100 WBC

## 2017-04-08 ENCOUNTER — Other Ambulatory Visit: Payer: Self-pay | Admitting: Internal Medicine

## 2017-04-11 ENCOUNTER — Other Ambulatory Visit: Payer: Self-pay | Admitting: *Deleted

## 2017-04-11 MED ORDER — HYDROCHLOROTHIAZIDE 12.5 MG PO CAPS: ORAL_CAPSULE | ORAL | 1 refills | Status: DC

## 2017-04-11 MED ORDER — AMLODIPINE BESYLATE 5 MG PO TABS: 5.0000 mg | ORAL_TABLET | Freq: Every day | ORAL | 1 refills | Status: DC

## 2017-04-26 ENCOUNTER — Other Ambulatory Visit: Payer: Medicare Other

## 2017-05-03 ENCOUNTER — Inpatient Hospital Stay: Payer: Medicare Other | Attending: Oncology

## 2017-05-03 DIAGNOSIS — D649 Anemia, unspecified: Secondary | ICD-10-CM | POA: Diagnosis not present

## 2017-05-03 DIAGNOSIS — D591 Other autoimmune hemolytic anemias: Secondary | ICD-10-CM | POA: Insufficient documentation

## 2017-05-03 DIAGNOSIS — D5912 Cold autoimmune hemolytic anemia: Secondary | ICD-10-CM

## 2017-05-03 DIAGNOSIS — D599 Acquired hemolytic anemia, unspecified: Secondary | ICD-10-CM

## 2017-05-03 DIAGNOSIS — D891 Cryoglobulinemia: Secondary | ICD-10-CM

## 2017-05-03 LAB — COMPREHENSIVE METABOLIC PANEL
ALBUMIN: 4.1 g/dL (ref 3.5–5.0)
ALK PHOS: 91 U/L (ref 38–126)
ALT: 12 U/L — ABNORMAL LOW (ref 17–63)
ANION GAP: 9 (ref 5–15)
AST: 24 U/L (ref 15–41)
BUN: 15 mg/dL (ref 6–20)
CALCIUM: 9.2 mg/dL (ref 8.9–10.3)
CHLORIDE: 102 mmol/L (ref 101–111)
CO2: 25 mmol/L (ref 22–32)
Creatinine, Ser: 1.21 mg/dL (ref 0.61–1.24)
GFR calc non Af Amer: 60 mL/min — ABNORMAL LOW (ref >60)
GLUCOSE: 128 mg/dL — AB (ref 65–99)
POTASSIUM: 3.7 mmol/L (ref 3.5–5.1)
SODIUM: 136 mmol/L (ref 135–145)
Total Bilirubin: 2.2 mg/dL — ABNORMAL HIGH (ref 0.3–1.2)
Total Protein: 6.8 g/dL (ref 6.5–8.1)

## 2017-05-03 LAB — CBC WITH DIFFERENTIAL/PLATELET
BAND NEUTROPHILS: 0 %
BASOS ABS: 0 10*3/uL (ref 0–0.1)
BLASTS: 0 %
Basophils Relative: 0 %
EOS ABS: 0.1 10*3/uL (ref 0–0.7)
Eosinophils Relative: 2 %
HEMATOCRIT: 28.2 % — AB (ref 39.0–52.0)
HEMOGLOBIN: 9.9 g/dL — AB (ref 13.0–17.0)
Lymphocytes Relative: 19 %
Lymphs Abs: 0.9 10*3/uL — ABNORMAL LOW (ref 1.0–3.6)
MCH: 32.5 pg (ref 26.0–34.0)
MCHC: 35.2 g/dL (ref 30.0–36.0)
MCV: 92.4 fL (ref 78.0–100.0)
METAMYELOCYTES PCT: 0 %
MYELOCYTES: 1 %
Monocytes Absolute: 0.5 10*3/uL (ref 0.2–1.0)
Monocytes Relative: 10 %
NEUTROS ABS: 3.4 10*3/uL (ref 1.4–6.5)
Neutrophils Relative %: 68 %
Other: 0 %
PLATELETS: 326 10*3/uL (ref 150–400)
PROMYELOCYTES ABS: 0 %
RBC: 3.06 MIL/uL — AB (ref 4.22–5.81)
RDW: 13.4 % (ref 11.5–15.5)
WBC: 4.9 10*3/uL (ref 4.0–10.5)
nRBC: 0 /100 WBC

## 2017-05-03 LAB — RETICULOCYTES
RBC.: 2.85 MIL/uL — ABNORMAL LOW (ref 4.40–5.90)
RETIC COUNT ABSOLUTE: 77 10*3/uL (ref 19.0–183.0)
Retic Ct Pct: 2.7 % (ref 0.4–3.1)

## 2017-05-04 LAB — HAPTOGLOBIN: Haptoglobin: <10 mg/dL — ABNORMAL LOW (ref 34–200)

## 2017-05-10 ENCOUNTER — Telehealth: Payer: Self-pay | Admitting: *Deleted

## 2017-05-10 ENCOUNTER — Other Ambulatory Visit: Payer: Self-pay | Admitting: *Deleted

## 2017-05-10 DIAGNOSIS — D5912 Cold autoimmune hemolytic anemia: Secondary | ICD-10-CM

## 2017-05-10 DIAGNOSIS — D591 Other autoimmune hemolytic anemias: Principal | ICD-10-CM

## 2017-05-10 NOTE — Telephone Encounter (Signed)
Called Philip Gordon wife and told her that his hgb 9.9. She tells me that he sits in his house and has no shirts on. I told her that [name] would like to have him come in mid. March to rechceck his numbers. She is agreeable and the appt was given for 3/12 at 2 pm.  She would like me to call pt and tell him he needs to wear clothes to keep warm and that will help him.  I told her that I will call him.  I sent message to add pt on for labs on 3/12.  I called pt and told him that hgb dropped 9.9 and he needs to stay warm.  I suggested to make sure he is in long sleeve shirts, long pants and wear a heat or toboggan.  He says he will because he has no choice to help himself to be better.  He would like to get better and he would like to avoid the rituxan.

## 2017-06-03 ENCOUNTER — Inpatient Hospital Stay: Payer: Medicare Other | Attending: Oncology

## 2017-06-03 DIAGNOSIS — D5912 Cold autoimmune hemolytic anemia: Secondary | ICD-10-CM

## 2017-06-03 DIAGNOSIS — D591 Other autoimmune hemolytic anemias: Secondary | ICD-10-CM | POA: Diagnosis not present

## 2017-06-03 LAB — COMPREHENSIVE METABOLIC PANEL
ALT: 9 U/L — ABNORMAL LOW (ref 17–63)
ANION GAP: 5 (ref 5–15)
AST: 16 U/L (ref 15–41)
Albumin: 4.1 g/dL (ref 3.5–5.0)
Alkaline Phosphatase: 95 U/L (ref 38–126)
BILIRUBIN TOTAL: 2.5 mg/dL — AB (ref 0.3–1.2)
BUN: 14 mg/dL (ref 6–20)
CO2: 29 mmol/L (ref 22–32)
Calcium: 9 mg/dL (ref 8.9–10.3)
Chloride: 103 mmol/L (ref 101–111)
Creatinine, Ser: 1.2 mg/dL (ref 0.61–1.24)
GFR calc Af Amer: >60 mL/min (ref >60)
Glucose, Bld: 92 mg/dL (ref 65–99)
POTASSIUM: 3.6 mmol/L (ref 3.5–5.1)
Sodium: 137 mmol/L (ref 135–145)
TOTAL PROTEIN: 6.9 g/dL (ref 6.5–8.1)

## 2017-06-03 LAB — CBC WITH DIFFERENTIAL/PLATELET
BASOS ABS: 0 10*3/uL (ref 0.0–0.1)
Basophils Relative: 1 %
Eosinophils Absolute: 0.1 10*3/uL (ref 0.0–0.7)
Eosinophils Relative: 1 %
HEMATOCRIT: 28.5 % — AB (ref 39.0–52.0)
Hemoglobin: 10.4 g/dL — ABNORMAL LOW (ref 13.0–17.0)
LYMPHS PCT: 19 %
Lymphs Abs: 1.2 10*3/uL (ref 0.7–4.0)
MCH: 33.2 pg (ref 26.0–34.0)
MCHC: 36.5 g/dL — ABNORMAL HIGH (ref 30.0–36.0)
MCV: 91 fL (ref 78.0–100.0)
Monocytes Absolute: 0.7 10*3/uL (ref 0.1–1.0)
Monocytes Relative: 11 %
NEUTROS ABS: 4.5 10*3/uL (ref 1.7–7.7)
Neutrophils Relative %: 69 %
PLATELETS: 325 10*3/uL (ref 150–400)
RBC: 3.13 MIL/uL — AB (ref 4.22–5.81)
RDW: 13.1 % (ref 11.5–15.5)
WBC: 6.6 10*3/uL (ref 4.0–10.5)

## 2017-06-03 LAB — RETICULOCYTES
RBC.: 2.53 MIL/uL — ABNORMAL LOW (ref 4.40–5.90)
Retic Count, Absolute: 60.7 10*3/uL (ref 19.0–183.0)
Retic Ct Pct: 2.4 % (ref 0.4–3.1)

## 2017-06-04 ENCOUNTER — Inpatient Hospital Stay: Payer: Medicare Other

## 2017-06-04 LAB — HAPTOGLOBIN

## 2017-06-06 ENCOUNTER — Telehealth: Payer: Self-pay | Admitting: *Deleted

## 2017-06-06 NOTE — Telephone Encounter (Signed)
Patient informed of doctor response 

## 2017-06-06 NOTE — Telephone Encounter (Signed)
Patient still has evidence of cold agglutinin and chronic hemolysis as before. But his hemoglobin is stable around 10. I would recommend continued observation at this time. Would not recommend any treatment unless hb drops consistently. He will see me in May 2019 as scheduled

## 2017-06-06 NOTE — Telephone Encounter (Signed)
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[place])   Ref Range & Units 3d ago  WBC 4.0 - 10.5 K/uL 6.6   RBC 4.22 - 5.81 MIL/uL 3.13 Abnormally low    Comment: CORRECTED FOR COLD AGGLUTININS  Hemoglobin 13.0 - 17.0 g/dL 10.4 Abnormally low    HCT 39.0 - 52.0 % 28.5 Abnormally low    MCV 78.0 - 100.0 fL 91.0   MCH 26.0 - 34.0 pg 33.2   MCHC 30.0 - 36.0 g/dL 36.5 Abnormally high    RDW 11.5 - 15.5 % 13.1   Platelets 150 - 400 K/uL 325   Comment: PLATELET COUNT CONFIRMED BY SMEAR  PLATELETS VARY IN SIZE   Neutrophils Relative % % 69   Neutro Abs 1.7 - 7.7 K/uL 4.5   Lymphocytes Relative % 19   Lymphs Abs 0.7 - 4.0 K/uL 1.2   Monocytes Relative % 11   Monocytes Absolute 0.1 - 1.0 K/uL 0.7   Eosinophils Relative % 1   Eosinophils Absolute 0.0 - 0.7 K/uL 0.1   Basophils Relative % 1   Basophils Absolute 0.0 - 0.1 K/uL 0.0   Comment: Performed at [facility], [address] [place], [place] [address]  Resulting Agency  [facility] LAB         Haptoglobin  Order: [id number]  Status:  Final result  Visible to patient:  No (Not Released)  Next appt:  07/25/2017 at 03:00 PM in Oncology ([facility])  Dx:  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[place])   Ref Range & Units 3d ago  Haptoglobin 34 - 200 mg/dL <10 Abnormally low         Dx:  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HCC)   Ref Range & Units 3d ago  Sodium 135 - 145 mmol/L 137   Potassium 3.5 - 5.1 mmol/L 3.6   Chloride 101 - 111 mmol/L 103   CO2 22 - 32 mmol/L 29   Glucose, Bld 65 - 99 mg/dL 92   BUN 6 - 20 mg/dL 14    Creatinine, Ser 0.61 - 1.24 mg/dL 1.20   Calcium 8.9 - 10.3 mg/dL 9.0   Total Protein 6.5 - 8.1 g/dL 6.9   Albumin 3.5 - 5.0 g/dL 4.1   AST 15 - 41 U/L 16   ALT 17 - 63 U/L 9 Abnormally low    Alkaline Phosphatase 38 - 126 U/L 95   Total Bilirubin 0.3 - 1.2 mg/dL 2.5 Abnormally high    GFR calc non Af Amer >60 mL/min >60   GFR calc Af Amer >60 mL/min >60   Comment: (NOTE)  The eGFR has been calculated using the CKD EPI equation.  This calculation has not been validated in all clinical situations.  eGFR's persistently <60 mL/min signify possible Chronic Kidney  Disease.   Anion gap 5 - 15 5   Comment: Performed at [facility], [address], [place], [place] [address]  Resulting Agency  [facility] CLIN  LAB      Specimen Collected: 06/03/17 15:05

## 2017-07-25 ENCOUNTER — Other Ambulatory Visit: Payer: Self-pay

## 2017-07-25 ENCOUNTER — Inpatient Hospital Stay: Payer: Medicare Other

## 2017-07-25 ENCOUNTER — Inpatient Hospital Stay: Payer: Medicare Other | Attending: Oncology | Admitting: Oncology

## 2017-07-25 VITALS — BP 129/63 | HR 58 | Temp 97.3°F | Resp 18 | Ht 67.0 in | Wt 141.0 lb

## 2017-07-25 DIAGNOSIS — D5912 Cold autoimmune hemolytic anemia: Secondary | ICD-10-CM

## 2017-07-25 DIAGNOSIS — D891 Cryoglobulinemia: Secondary | ICD-10-CM

## 2017-07-25 DIAGNOSIS — I10 Essential (primary) hypertension: Secondary | ICD-10-CM | POA: Diagnosis not present

## 2017-07-25 DIAGNOSIS — C61 Malignant neoplasm of prostate: Secondary | ICD-10-CM

## 2017-07-25 DIAGNOSIS — D591 Other autoimmune hemolytic anemias: Secondary | ICD-10-CM

## 2017-07-25 DIAGNOSIS — G35 Multiple sclerosis: Secondary | ICD-10-CM

## 2017-07-25 DIAGNOSIS — D649 Anemia, unspecified: Secondary | ICD-10-CM

## 2017-07-25 DIAGNOSIS — D599 Acquired hemolytic anemia, unspecified: Secondary | ICD-10-CM

## 2017-07-25 DIAGNOSIS — F17220 Nicotine dependence, chewing tobacco, uncomplicated: Secondary | ICD-10-CM | POA: Diagnosis not present

## 2017-07-25 LAB — COMPREHENSIVE METABOLIC PANEL
ALBUMIN: 4 g/dL (ref 3.5–5.0)
ALT: 9 U/L — ABNORMAL LOW (ref 17–63)
AST: 18 U/L (ref 15–41)
Alkaline Phosphatase: 98 U/L (ref 38–126)
Anion gap: 9 (ref 5–15)
BILIRUBIN TOTAL: 2.7 mg/dL — AB (ref 0.3–1.2)
BUN: 13 mg/dL (ref 6–20)
CO2: 27 mmol/L (ref 22–32)
Calcium: 9.4 mg/dL (ref 8.9–10.3)
Chloride: 102 mmol/L (ref 101–111)
Creatinine, Ser: 1.17 mg/dL (ref 0.61–1.24)
GFR calc Af Amer: >60 mL/min (ref >60)
GFR calc non Af Amer: >60 mL/min (ref >60)
GLUCOSE: 102 mg/dL — AB (ref 65–99)
Potassium: 4.3 mmol/L (ref 3.5–5.1)
Sodium: 138 mmol/L (ref 135–145)
TOTAL PROTEIN: 6.8 g/dL (ref 6.5–8.1)

## 2017-07-25 LAB — RETICULOCYTES
RBC.: 0.26 MIL/uL — AB (ref 4.40–5.90)
Retic Count, Absolute: 5.7 10*3/uL — ABNORMAL LOW (ref 19.0–183.0)
Retic Ct Pct: 2.2 % (ref 0.4–3.1)

## 2017-07-25 LAB — CBC WITH DIFFERENTIAL/PLATELET
BAND NEUTROPHILS: 0 %
BASOS ABS: 0.2 10*3/uL — AB (ref 0–0.1)
BASOS PCT: 4 %
BLASTS: 0 %
EOS ABS: 0.1 10*3/uL (ref 0–0.7)
EOS PCT: 2 %
HCT: 26.7 % — ABNORMAL LOW (ref 39.0–52.0)
Hemoglobin: 9.7 g/dL — ABNORMAL LOW (ref 13.0–17.0)
LYMPHS ABS: 1 10*3/uL (ref 1.0–3.6)
Lymphocytes Relative: 16 %
MCH: 34 pg (ref 26.0–34.0)
MCHC: 36.4 g/dL — ABNORMAL HIGH (ref 30.0–36.0)
MCV: 93.2 fL (ref 78.0–100.0)
METAMYELOCYTES PCT: 0 %
MONO ABS: 0.6 10*3/uL (ref 0.2–1.0)
Monocytes Relative: 10 %
Myelocytes: 0 %
NEUTROS ABS: 4.2 10*3/uL (ref 1.4–6.5)
Neutrophils Relative %: 68 %
Other: 0 %
PLATELETS: 332 10*3/uL (ref 150–400)
Promyelocytes Relative: 0 %
RBC: 2.87 MIL/uL — ABNORMAL LOW (ref 4.22–5.81)
RDW: 14 % (ref 11.5–15.5)
WBC: 6.1 10*3/uL (ref 4.0–10.5)
nRBC: 0 /100 WBC

## 2017-07-25 LAB — VITAMIN B12: Vitamin B-12: 555 pg/mL (ref 180–914)

## 2017-07-25 LAB — PSA: PROSTATIC SPECIFIC ANTIGEN: 0.02 ng/mL (ref 0.00–4.00)

## 2017-07-25 LAB — FOLATE: Folate: 35 ng/mL (ref >5.9)

## 2017-07-25 NOTE — Progress Notes (Signed)
No new changes noted today 

## 2017-07-26 ENCOUNTER — Encounter: Payer: Self-pay | Admitting: Oncology

## 2017-07-26 LAB — HAPTOGLOBIN

## 2017-07-26 NOTE — Progress Notes (Signed)
Hematology/Oncology Consult note [facility]  [phone] [phone]  Patient Care Team: Einar Pheasant, MD as PCP - General (Internal Medicine)   Name of the patient: Philip Gordon  [id number]  18-Jun-1950   Date of visit: 07/26/17  Diagnosis- cold agglutinin hemolytic anemia  Chief complaint/ Reason for visit- routine f/u of anemia  Heme/Onc history: patient is a 67 year old male who sees [name] for multiple sclerosis. He is currently on prednisone taper and also takes Aricept. Recent CBC from 09/04/2016 showed white count of 7.9, H&H of 10.5/30.8 with an MCV of 97.2 and a platelet count of 396. Cold agglutinin/cryoglobulin was susrecent B12, TSH and hepatic function panel was normal. iron study showed an increased iron saturation of 61.5%. Ferritin was 175 and folate was within normal limits.   Patient has secondary progressive MS diagnosed in 1980 now gradually getting worse. He lives alone and uses walker for ambulation. He thinks he is doing well staying alone. But his guardians think otherwise and feel he is failing slowly and needs more help  Results of bloodwork from 10/11/2016 were as follows: CBC showed white count of 6.9, H&H of 10.1/27.8 and a platelet count of 331. Iron studies were within normal limits. CMP was significant for elevated creatinine of 1.3 and elevated total bilirubin of 2.8.Marland Kitchen ANA was negative and C3 and C4 were within normal limits. HIV antibody was negative. Hepatitis B and C testing was negative. Multiple myeloma panel revealed no monoclonal protein. Rheumatoid factor was negative and ESR was mildly elevated at 23. CRP was elevated at 3.1. Haptoglobin was less than 10. Reticulocyte count was normal at 2.7%.  Review of peripheral blood smear revealed prominent RBC agglutination. 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  CT chest abdomen and pelvis without contrast did not reveal any evidence of adenopathy or malignancy.  Patient has been on observation for his hemolytic anemia and his hemoglobin has remained stable around 10 but with evidence of active hemolysis   Interval history- he has baseline fatigue and weakness from his MS. He is able to walk around the house. He does not go the gym like before. He reports sensitivity over his back skin and therefore sits without a shirt for most of the day. He has a caregiver at home  ECOG PS- 2 Pain scale- 0   Review of systems- Review of Systems  Constitutional: Positive for malaise/fatigue.  Musculoskeletal:       Leg weakness      No Known Allergies   Past Medical History:  Diagnosis Date  . Depression   . Hypercholesterolemia   . Hypertension   . Multiple sclerosis (HCC)    Sees Dr Ala Bent [facility])  . Prostate cancer [facility])    Followed by Dr [name] and Dr [name]     Past Surgical History:  Procedure Laterality Date  . KNEE SURGERY      Social History   Socioeconomic History  . Marital status: Divorced    Spouse name: Not on file  . Number of children: Not on file  . Years of education: Not on file  . Highest education level: Not on file  Occupational History  . Not on file  Social Needs  . Financial resource strain: Not on file  . Food insecurity:    Worry: Not on file    Inability: Not on file  .  Transportation needs:    Medical: Not on file    Non-medical: Not on file  Tobacco Use  . Smoking status: Never Smoker  . Smokeless tobacco: Current User    Types: Chew  . Tobacco comment: Is going to try and cut down on the amount.   Substance and Sexual Activity  . Alcohol use: No    Alcohol/week: 1.2 oz    Types: 2 Cans of beer per week  . Drug use: No  . Sexual activity: Not on file  Lifestyle  . Physical activity:    Days per week: Not on file    Minutes per  session: Not on file  . Stress: Not on file  Relationships  . Social connections:    Talks on phone: Not on file    Gets together: Not on file    Attends religious service: Not on file    Active member of club or organization: Not on file    Attends meetings of clubs or organizations: Not on file    Relationship status: Not on file  . Intimate partner violence:    Fear of current or ex partner: Not on file    Emotionally abused: Not on file    Physically abused: Not on file    Forced sexual activity: Not on file  Other Topics Concern  . Not on file  Social History Narrative  . Not on file    Family History  Problem Relation Age of Onset  . Hypertension Mother   . Stroke Father   . Diabetes Maternal Grandmother   . Diabetes Maternal Grandfather   . Leukemia Sister        died - age 23     Current Outpatient Medications:  .  amLODipine (NORVASC) 5 MG tablet, Take 1 tablet (5 mg total) by mouth daily., Disp: 90 tablet, Rfl: 1 .  donepezil (ARICEPT) 10 MG tablet, Take 1qd, Disp: , Rfl: 5 .  ezetimibe (ZETIA) 10 MG tablet, Take 1 tablet (10 mg total) by mouth daily., Disp: 90 tablet, Rfl: 0 .  finasteride (PROSCAR) 5 MG tablet, TAKE 1 TABLET BY MOUTH DAILY, Disp: 90 tablet, Rfl: 0 .  fluticasone (FLONASE) 50 MCG/ACT nasal spray, Place 2 sprays into both nostrils daily., Disp: 16 g, Rfl: 6 .  folic acid (FOLVITE) 1 MG tablet, Take 1 tablet (1 mg total) daily by mouth., Disp: 90 tablet, Rfl: 1 .  hydrochlorothiazide (MICROZIDE) 12.5 MG capsule, TAKE 1 CAPSULE (12.5 MG TOTAL) BY MOUTH DAILY., Disp: 90 capsule, Rfl: 1 .  mirtazapine (REMERON) 15 MG tablet, TAKE 1/2 TABLET BY MOUTH AT BEDTIME (Patient taking differently: Takes one tablet at bedtime), Disp: 15 tablet, Rfl: 4 .  tiZANidine (ZANAFLEX) 4 MG tablet, TAKE 1 TABLET BY MOUTH NIGHTLY, Disp: 30 tablet, Rfl: 4 .  vitamin B-12 (CYANOCOBALAMIN) 1000 MCG tablet, Take 1 tablet (1,000 mcg total) by mouth daily., Disp: 90 tablet, Rfl:  3  Physical exam:  Vitals:   07/25/17 1344 07/25/17 1348  BP: 129/63   Pulse: (!) 58   Resp: 18   Temp: (!) 97.3 F (36.3 C)   TempSrc: Tympanic   SpO2:  99%  Weight: 141 lb (64 kg)   Height: _0  (1.702 m)    Physical Exam  Constitutional: He is oriented to person, place, and time.  Thin gentleman sitting in a wheelchair. Appears in no acute distress  HENT:  Head: Normocephalic and atraumatic.  Eyes: Pupils are equal, round, and reactive to  light. EOM are normal.  Neck: Normal range of motion.  Cardiovascular: Normal rate, regular rhythm and normal heart sounds.  Pulmonary/Chest: Effort normal and breath sounds normal.  Abdominal: Soft. Bowel sounds are normal.  Neurological: He is alert and oriented to person, place, and time.  B/l leg braces  Skin: Skin is warm and dry.     CMP Latest Ref Rng & Units 07/25/2017  Glucose 65 - 99 mg/dL 102(H)  BUN 6 - 20 mg/dL 13  Creatinine 0.61 - 1.24 mg/dL 1.17  Sodium 135 - 145 mmol/L 138  Potassium 3.5 - 5.1 mmol/L 4.3  Chloride 101 - 111 mmol/L 102  CO2 22 - 32 mmol/L 27  Calcium 8.9 - 10.3 mg/dL 9.4  Total Protein 6.5 - 8.1 g/dL 6.8  Total Bilirubin 0.3 - 1.2 mg/dL 2.7(H)  Alkaline Phos 38 - 126 U/L 98  AST 15 - 41 U/L 18  ALT 17 - 63 U/L 9(L)   CBC Latest Ref Rng & Units 07/25/2017  WBC 4.0 - 10.5 K/uL 6.1  Hemoglobin 13.0 - 17.0 g/dL 9.7(L)  Hematocrit 39.0 - 52.0 % 26.7(L)  Platelets 150 - 400 K/uL 332     Assessment and plan- Patient is a 67 y.o. male with cold agglutinin hemolytic anemia  Patient has continued evidence of hemolysis as evidenced by haptoglobin which has been less than 10 and total bilirubin which is between 2-2.8.  His hemoglobin has been stable around 10.  Systemic scans did not reveal any evidence of malignancy and myeloma panel did not reveal any M protein.  The cause of his hemolytic anemia is therefore uncertain.  Given his borderline performance status from underlying MS and relatively stable  anemia with a hemoglobin of 10, I am inclined to monitor this conservatively at this time without any treatment.  Certainly if his hemoglobin starts trending down I would consider single agent Rituxan 375 mg IV weekly x4 for him.  Again recommended that he should try to remain warm most of the time and avoid cold temperatures.  Repeat CBC, reticulocyte count, CMP, haptoglobin in 2 in 4 months and I will see him back in 4 months.  I will also check his folate level in 4 months.  Dr. Nicki Reaper has requested PSA to be added to his today's labs given his prior history of prostate cancer which I will do so and will be followed up by Dr. Nicki Reaper   Visit Diagnosis 1. Malignant neoplasm of prostate ([place])   2. 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[place])   3. Acquired hemolytic anemia (HCC)      Dr. Randa Evens, MD, MPH [facility] [phone] 07/26/2017 10:02 AM

## 2017-08-26 ENCOUNTER — Telehealth: Payer: Self-pay | Admitting: Internal Medicine

## 2017-08-26 NOTE — Telephone Encounter (Signed)
Copied from [place] [id number]. Topic: Quick Communication - See Telephone Encounter >> Aug 26, 2017 11:41 AM Cleaster Corin, NT wrote: CRM for notification. See Telephone encounter for: 08/26/17.  Pt. Calling to see if he can set up an appt. To have the shingrix vaccine

## 2017-09-17 ENCOUNTER — Ambulatory Visit (INDEPENDENT_AMBULATORY_CARE_PROVIDER_SITE_OTHER): Payer: Medicare Other | Admitting: Internal Medicine

## 2017-09-17 DIAGNOSIS — D591 Other autoimmune hemolytic anemias: Secondary | ICD-10-CM | POA: Diagnosis not present

## 2017-09-17 DIAGNOSIS — Z8546 Personal history of malignant neoplasm of prostate: Secondary | ICD-10-CM

## 2017-09-17 DIAGNOSIS — D891 Cryoglobulinemia: Secondary | ICD-10-CM

## 2017-09-17 DIAGNOSIS — G35 Multiple sclerosis: Secondary | ICD-10-CM

## 2017-09-17 DIAGNOSIS — C8589 Other specified types of non-Hodgkin lymphoma, extranodal and solid organ sites: Secondary | ICD-10-CM

## 2017-09-17 DIAGNOSIS — D5912 Cold autoimmune hemolytic anemia: Secondary | ICD-10-CM

## 2017-09-17 DIAGNOSIS — E7800 Pure hypercholesterolemia, unspecified: Secondary | ICD-10-CM | POA: Diagnosis not present

## 2017-09-17 DIAGNOSIS — I10 Essential (primary) hypertension: Secondary | ICD-10-CM

## 2017-09-17 NOTE — Progress Notes (Signed)
Patient ID: Philip Gordon, male   DOB: [birth year], 67 y.o.   MRN: [id number]   Subjective:    Patient ID: Philip Gordon, male    DOB: September 03, 1950, 67 y.o.   MRN: [id number]  HPI  Patient here for a scheduled follow up.  He is accompanied by his friend.  History obtained from both of them.  He is staying by himself.  He gets meals on wheels.  Also they bring in food for him.  He has someone that comes to his house a few hours a day for 5 days per week.  Discussed my concern about him being at home.  Discussed assisted living.  He declines.  No chest pain.  Ambulates with his walker at home.  No sob.  No acid reflux.  No abdominal pain.  Bowels moving.  Is followed by urology for his history of prostate cancer.  Also is followed by hematology for hemolytic anemia.  Stable.     Past Medical History:  Diagnosis Date  . Depression   . Hypercholesterolemia   . Hypertension   . Multiple sclerosis (HCC)    Sees Dr Ala Bent [facility])  . Prostate cancer [facility])    Followed by Dr [name] and Dr [name]   Past Surgical History:  Procedure Laterality Date  . KNEE SURGERY     Family History  Problem Relation Age of Onset  . Hypertension Mother   . Stroke Father   . Diabetes Maternal Grandmother   . Diabetes Maternal Grandfather   . Leukemia Sister        died - age 52   Social History   Socioeconomic History  . Marital status: Divorced    Spouse name: Not on file  . Number of children: Not on file  . Years of education: Not on file  . Highest education level: Not on file  Occupational History  . Not on file  Social Needs  . Financial resource strain: Not on file  . Food insecurity:    Worry: Not on file    Inability: Not on file  . Transportation needs:    Medical: Not on file    Non-medical: Not on file  Tobacco Use  . Smoking status: Never Smoker  . Smokeless tobacco: Current User    Types: Chew  . Tobacco comment: Is going to try and cut down on the amount.   Substance  and Sexual Activity  . Alcohol use: No    Alcohol/week: 1.2 oz    Types: 2 Cans of beer per week  . Drug use: No  . Sexual activity: Not on file  Lifestyle  . Physical activity:    Days per week: Not on file    Minutes per session: Not on file  . Stress: Not on file  Relationships  . Social connections:    Talks on phone: Not on file    Gets together: Not on file    Attends religious service: Not on file    Active member of club or organization: Not on file    Attends meetings of clubs or organizations: Not on file    Relationship status: Not on file  Other Topics Concern  . Not on file  Social History Narrative  . Not on file    Outpatient Encounter Medications as of 09/17/2017  Medication Sig  . amLODipine (NORVASC) 5 MG tablet Take 1 tablet (5 mg total) by mouth daily.  Marland Kitchen donepezil (ARICEPT) 10 MG tablet Take 1qd  .  ezetimibe (ZETIA) 10 MG tablet Take 1 tablet (10 mg total) by mouth daily.  . finasteride (PROSCAR) 5 MG tablet TAKE 1 TABLET BY MOUTH DAILY  . fluticasone (FLONASE) 50 MCG/ACT nasal spray Place 2 sprays into both nostrils daily.  . folic acid (FOLVITE) 1 MG tablet Take 1 tablet (1 mg total) daily by mouth.  . hydrochlorothiazide (MICROZIDE) 12.5 MG capsule TAKE 1 CAPSULE (12.5 MG TOTAL) BY MOUTH DAILY.  . mirtazapine (REMERON) 15 MG tablet TAKE 1/2 TABLET BY MOUTH AT BEDTIME (Patient taking differently: Takes one tablet at bedtime)  . tiZANidine (ZANAFLEX) 4 MG tablet TAKE 1 TABLET BY MOUTH NIGHTLY  . vitamin B-12 (CYANOCOBALAMIN) 1000 MCG tablet Take 1 tablet (1,000 mcg total) by mouth daily.   No facility-administered encounter medications on file as of 09/17/2017.     Review of Systems  Constitutional: Negative for appetite change and unexpected weight change.  HENT: Negative for congestion and sinus pressure.   Respiratory: Negative for cough, chest tightness and shortness of breath.   Cardiovascular: Negative for chest pain, palpitations and leg  swelling.  Gastrointestinal: Negative for abdominal pain, diarrhea and nausea.  Genitourinary: Negative for difficulty urinating and dysuria.  Musculoskeletal: Negative for joint swelling and myalgias.  Skin: Negative for color change and rash.  Neurological: Negative for dizziness, light-headedness and headaches.  Psychiatric/Behavioral: Negative for agitation and dysphoric mood.       Objective:     Blood pressure rechecked by me:  116/68  Physical Exam  Constitutional: He appears well-developed and well-nourished. No distress.  HENT:  Nose: Nose normal.  Mouth/Throat: Oropharynx is clear and moist.  Neck: Neck supple.  Cardiovascular: Normal rate and regular rhythm.  Pulmonary/Chest: Effort normal and breath sounds normal. No respiratory distress.  Abdominal: Soft. Bowel sounds are normal. There is no tenderness.  Musculoskeletal: He exhibits no edema or tenderness.  Lymphadenopathy:    He has no cervical adenopathy.  Skin: No rash noted. No erythema.  Psychiatric: He has a normal mood and affect. His behavior is normal.    BP (!) 106/58 (BP Location: Left Arm, Patient Position: Sitting, Cuff Size: Normal)   Pulse 72   Temp 98.1 F (36.7 C) (Oral)   Resp 18   Wt 145 lb 3.2 oz (65.9 kg)   SpO2 99%   BMI 22.74 kg/m  Wt Readings from Last 3 Encounters:  09/17/17 145 lb 3.2 oz (65.9 kg)  07/25/17 141 lb (64 kg)  11/22/16 146 lb 1.6 oz (66.3 kg)     Lab Results  Component Value Date   WBC 6.1 07/25/2017   HGB 9.7 (L) 07/25/2017   HCT 26.7 (L) 07/25/2017   PLT 332 07/25/2017   GLUCOSE 102 (H) 07/25/2017   CHOL 279 (H) 10/18/2015   TRIG 166.0 (H) 10/18/2015   HDL 62.40 10/18/2015   LDLDIRECT 129.6 01/30/2013   LDLCALC 183 (H) 10/18/2015   ALT 9 (L) 07/25/2017   AST 18 07/25/2017   NA 138 07/25/2017   K 4.3 07/25/2017   CL 102 07/25/2017   CREATININE 1.17 07/25/2017   BUN 13 07/25/2017   CO2 27 07/25/2017   TSH 1.70 09/04/2016   PSA 0.01 (L) 09/04/2016        Assessment & Plan:   Problem List Items Addressed This Visit    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  Stable.        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[place])    Followed  by hematology.        History of prostate cancer    Has been followed by Dr [name].  Just had psa drawn.  Discussed Dr [name] leaving.  Wants to see Dr Bernardo Heater for f/u.        Relevant Orders   Ambulatory referral to Urology   Hypercholesteremia    Unable to take statin medication.  On zetia.  Follow lipid panel.        Hypertension    Blood pressure under good control.  Continue same medication regimen.  Follow pressures.  Follow metabolic panel.        Multiple sclerosis ([place])    Followed by Dr Manuella Ghazi.  Limited mobility.  Uses a walker.  Discussed assisted living.  He declines.  Follow.            Einar Pheasant, MD

## 2017-09-21 ENCOUNTER — Encounter: Payer: Self-pay | Admitting: Internal Medicine

## 2017-09-21 DIAGNOSIS — C8589 Other specified types of non-Hodgkin lymphoma, extranodal and solid organ sites: Secondary | ICD-10-CM | POA: Insufficient documentation

## 2017-09-21 DIAGNOSIS — C8339 Primary central nervous system lymphoma: Secondary | ICD-10-CM | POA: Insufficient documentation

## 2017-09-21 DIAGNOSIS — D891 Cryoglobulinemia: Secondary | ICD-10-CM | POA: Insufficient documentation

## 2017-09-21 NOTE — Assessment & Plan Note (Signed)
Blood pressure under good control.  Continue same medication regimen.  Follow pressures.  Follow metabolic panel.   

## 2017-09-21 NOTE — Assessment & Plan Note (Signed)
Followed by Dr Manuella Ghazi.  Limited mobility.  Uses a walker.  Discussed assisted living.  He declines.  Follow.

## 2017-09-21 NOTE — Assessment & Plan Note (Signed)
Followed by hematology 

## 2017-09-21 NOTE — Assessment & Plan Note (Signed)
Followed by hematology. Stable.  

## 2017-09-21 NOTE — Assessment & Plan Note (Signed)
Unable to take statin medication.  On zetia.  Follow lipid panel.

## 2017-09-21 NOTE — Assessment & Plan Note (Signed)
Has been followed by Dr [name].  Just had psa drawn.  Discussed Dr [name] leaving.  Wants to see Dr Bernardo Heater for f/u.

## 2017-09-24 ENCOUNTER — Inpatient Hospital Stay: Payer: Medicare Other

## 2017-10-07 ENCOUNTER — Other Ambulatory Visit: Payer: Self-pay | Admitting: Internal Medicine

## 2017-10-22 ENCOUNTER — Telehealth: Payer: Self-pay | Admitting: *Deleted

## 2017-10-22 NOTE — Telephone Encounter (Signed)
Sister in law called and was going to r/s labs for her brother in law.she would like to have him come this Thursday and Verdis Frederickson give her appt time 11:45.  Enid Derry is good with that.

## 2017-10-24 ENCOUNTER — Inpatient Hospital Stay: Payer: Medicare Other | Attending: Oncology

## 2017-10-24 DIAGNOSIS — D599 Acquired hemolytic anemia, unspecified: Secondary | ICD-10-CM

## 2017-10-24 DIAGNOSIS — D591 Other autoimmune hemolytic anemias: Secondary | ICD-10-CM | POA: Diagnosis not present

## 2017-10-24 DIAGNOSIS — D5912 Cold autoimmune hemolytic anemia: Secondary | ICD-10-CM

## 2017-10-24 DIAGNOSIS — C61 Malignant neoplasm of prostate: Secondary | ICD-10-CM | POA: Diagnosis not present

## 2017-10-24 LAB — CBC WITH DIFFERENTIAL/PLATELET
BAND NEUTROPHILS: 0 %
BASOS ABS: 0 10*3/uL (ref 0–0.1)
Basophils Relative: 0 %
Blasts: 0 %
EOS ABS: 0.1 10*3/uL (ref 0–0.7)
Eosinophils Relative: 1 %
HCT: 30.8 % — ABNORMAL LOW (ref 40.0–52.0)
Hemoglobin: 11.1 g/dL — ABNORMAL LOW (ref 13.0–18.0)
LYMPHS PCT: 17 %
Lymphs Abs: 1 10*3/uL (ref 1.0–3.6)
MCH: 32.9 pg (ref 26.0–34.0)
MCHC: 36.1 g/dL — ABNORMAL HIGH (ref 32.0–36.0)
MCV: 91.1 fL (ref 80.0–100.0)
Metamyelocytes Relative: 0 %
Monocytes Absolute: 0.5 10*3/uL (ref 0.2–1.0)
Monocytes Relative: 8 %
Myelocytes: 0 %
NEUTROS PCT: 74 %
Neutro Abs: 4.4 10*3/uL (ref 1.4–6.5)
OTHER: 0 %
Platelets: 195 10*3/uL (ref 150–440)
Promyelocytes Relative: 0 %
RBC: 3.38 MIL/uL — ABNORMAL LOW (ref 4.22–5.81)
RDW: 13.6 % (ref 11.5–14.5)
SMEAR REVIEW: ADEQUATE
WBC: 6 10*3/uL (ref 4.0–10.5)
nRBC: 2 /100 WBC — ABNORMAL HIGH

## 2017-10-24 LAB — COMPREHENSIVE METABOLIC PANEL
ALK PHOS: 95 U/L (ref 38–126)
ALT: 12 U/L (ref 0–44)
ANION GAP: 9 (ref 5–15)
AST: 19 U/L (ref 15–41)
Albumin: 4.1 g/dL (ref 3.5–5.0)
BUN: 18 mg/dL (ref 8–23)
CALCIUM: 9.3 mg/dL (ref 8.9–10.3)
CO2: 26 mmol/L (ref 22–32)
Chloride: 103 mmol/L (ref 98–111)
Creatinine, Ser: 1.2 mg/dL (ref 0.61–1.24)
GFR, EST NON AFRICAN AMERICAN: 60 mL/min — AB (ref >60)
GLUCOSE: 96 mg/dL (ref 70–99)
Potassium: 3.4 mmol/L — ABNORMAL LOW (ref 3.5–5.1)
Sodium: 138 mmol/L (ref 135–145)
Total Bilirubin: 2.4 mg/dL — ABNORMAL HIGH (ref 0.3–1.2)
Total Protein: 7 g/dL (ref 6.5–8.1)

## 2017-10-24 LAB — RETICULOCYTES
RBC.: 2.73 MIL/uL — ABNORMAL LOW (ref 4.40–5.90)
RETIC CT PCT: 2.5 % (ref 0.4–3.1)
Retic Count, Absolute: 68.3 10*3/uL (ref 19.0–183.0)

## 2017-10-25 LAB — HAPTOGLOBIN: Haptoglobin: <10 mg/dL — ABNORMAL LOW (ref 34–200)

## 2017-11-18 ENCOUNTER — Ambulatory Visit: Payer: Self-pay | Admitting: Urology

## 2017-11-21 ENCOUNTER — Ambulatory Visit: Payer: Self-pay | Admitting: Urology

## 2017-11-21 ENCOUNTER — Other Ambulatory Visit: Payer: Self-pay | Admitting: Internal Medicine

## 2017-11-26 ENCOUNTER — Other Ambulatory Visit: Payer: Medicare Other

## 2017-11-26 ENCOUNTER — Ambulatory Visit: Payer: Medicare Other | Admitting: Oncology

## 2017-12-06 ENCOUNTER — Inpatient Hospital Stay: Payer: Medicare Other | Attending: Oncology

## 2017-12-06 ENCOUNTER — Encounter: Payer: Self-pay | Admitting: Oncology

## 2017-12-06 ENCOUNTER — Inpatient Hospital Stay (HOSPITAL_BASED_OUTPATIENT_CLINIC_OR_DEPARTMENT_OTHER): Payer: Medicare Other | Admitting: Oncology

## 2017-12-06 VITALS — BP 124/68 | HR 56 | Temp 98.2°F | Resp 18 | Ht 67.0 in | Wt 144.0 lb

## 2017-12-06 DIAGNOSIS — Z79899 Other long term (current) drug therapy: Secondary | ICD-10-CM | POA: Insufficient documentation

## 2017-12-06 DIAGNOSIS — I10 Essential (primary) hypertension: Secondary | ICD-10-CM | POA: Insufficient documentation

## 2017-12-06 DIAGNOSIS — D599 Acquired hemolytic anemia, unspecified: Secondary | ICD-10-CM

## 2017-12-06 DIAGNOSIS — F329 Major depressive disorder, single episode, unspecified: Secondary | ICD-10-CM | POA: Diagnosis not present

## 2017-12-06 DIAGNOSIS — G35 Multiple sclerosis: Secondary | ICD-10-CM | POA: Diagnosis not present

## 2017-12-06 DIAGNOSIS — D591 Other autoimmune hemolytic anemias: Secondary | ICD-10-CM

## 2017-12-06 DIAGNOSIS — D5912 Cold autoimmune hemolytic anemia: Secondary | ICD-10-CM

## 2017-12-06 DIAGNOSIS — Z8546 Personal history of malignant neoplasm of prostate: Secondary | ICD-10-CM | POA: Diagnosis not present

## 2017-12-06 DIAGNOSIS — Z720 Tobacco use: Secondary | ICD-10-CM | POA: Diagnosis not present

## 2017-12-06 LAB — CBC WITH DIFFERENTIAL/PLATELET
BASOS ABS: 0 10*3/uL (ref 0–0.1)
Basophils Relative: 0 %
Eosinophils Absolute: 0 10*3/uL (ref 0–0.7)
Eosinophils Relative: 1 %
HEMATOCRIT: 28.6 % — AB (ref 39.0–52.0)
HEMOGLOBIN: 10.3 g/dL — AB (ref 13.0–17.0)
LYMPHS PCT: 15 %
Lymphs Abs: 1.3 10*3/uL (ref 1.0–3.6)
MCH: 33.1 pg (ref 26.0–34.0)
MCHC: 36.1 g/dL — ABNORMAL HIGH (ref 30.0–36.0)
MCV: 91.7 fL (ref 78.0–100.0)
MONO ABS: 1 10*3/uL (ref 0.2–1.0)
Monocytes Relative: 12 %
NEUTROS ABS: 5.9 10*3/uL (ref 1.4–6.5)
NEUTROS PCT: 72 %
Platelets: 357 10*3/uL (ref 150–440)
RBC: 3.12 MIL/uL — AB (ref 4.22–5.81)
RDW: 13.6 % (ref 11.5–15.5)
WBC: 8.2 10*3/uL (ref 3.8–10.6)

## 2017-12-06 LAB — FOLATE: Folate: 27 ng/mL (ref >5.9)

## 2017-12-06 NOTE — Progress Notes (Signed)
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 for multiple sclerosis. He is currently on prednisone taper and also takes Aricept. Recent CBC from 09/04/2016 showed white count of 7.9, H&H of 10.5/30.8 with an MCV of 97.2 and a platelet count of 396. Cold agglutinin/cryoglobulin was susrecent B12, TSH and hepatic function panel was normal. iron study showed an increased iron saturation of 61.5%. Ferritin was 175 and folate was within normal limits.   Patient has secondary progressive MS diagnosed in 1980 now gradually getting worse. He lives alone and uses walker for ambulation. He thinks he is doing well staying alone. But his guardians think otherwise and feel he is failing slowly and needs more help  Results of bloodwork from 10/11/2016 were as follows: CBC showed white count of 6.9, H&H of 10.1/27.8 and a platelet count of 331. Iron studies were within normal limits. CMP was significant for elevated creatinine of 1.3 and elevated total bilirubin of 2.8.Marland Kitchen ANA was negative and C3 and C4 were within normal limits. HIV antibody was negative. Hepatitis B and C testing was negative. Multiple myeloma panel revealed no monoclonal protein. Rheumatoid factor was negative and ESR was mildly elevated at 23. CRP was elevated at 3.1. Haptoglobin was less than 10. Reticulocyte count was normal at 2.7%.  Review of peripheral blood smear revealed prominent RBC agglutination. 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  CT chest abdomen and pelvis without contrast did not reveal any evidence of adenopathy or malignancy.  Patient has been on observation for his hemolytic anemia and his hemoglobin has remained stable around 10 but with evidence of active hemolysis and has not required any treatment so far   Interval history-overall patient feels at his baseline.  He has not had any falls.  He is mainly getting around the house but his mobility is restricted to within the house.  He tries to keep himself warm.  Denies any worsening fatigue today although he has some baseline fatigue which has remained unchanged.  Denies any blood in stool or urine.  ECOG PS- 2 Pain scale- 0 Opioid associated constipation- no  Review of systems- Review of Systems  Constitutional: Positive for malaise/fatigue. Negative for chills, fever and weight loss.  HENT: Negative for congestion, ear discharge and nosebleeds.   Eyes: Negative for blurred vision.  Respiratory: Negative for cough, hemoptysis, sputum production, shortness of breath and wheezing.   Cardiovascular: Negative for chest pain, palpitations, orthopnea and claudication.  Gastrointestinal: Negative for abdominal pain, blood in stool, constipation, diarrhea, heartburn, melena, nausea and vomiting.  Genitourinary: Negative for dysuria, flank pain, frequency, hematuria and urgency.  Musculoskeletal: Negative for back pain, joint pain and myalgias.  Skin: Negative for rash.  Neurological: Negative for dizziness, tingling, focal weakness, seizures, weakness and headaches.  Endo/Heme/Allergies: Does not bruise/bleed easily.  Psychiatric/Behavioral: Negative for depression and suicidal ideas. The patient does not have insomnia.       No Known Allergies   Past Medical History:  Diagnosis Date  . Depression   .  Hypercholesterolemia   . Hypertension   . Multiple  sclerosis (HCC)    Sees Dr Ala [facility])  . Prostate cancer [facility])    Followed by Dr [name] and Dr [name]     Past Surgical History:  Procedure Laterality Date  . KNEE SURGERY      Social History   Socioeconomic History  . Marital status: Divorced    Spouse name: Not on file  . Number of children: Not on file  . Years of education: Not on file  . Highest education level: Not on file  Occupational History  . Not on file  Social Needs  . Financial resource strain: Not on file  . Food insecurity:    Worry: Not on file    Inability: Not on file  . Transportation needs:    Medical: Not on file    Non-medical: Not on file  Tobacco Use  . Smoking status: Never Smoker  . Smokeless tobacco: Current User    Types: Chew  . Tobacco comment: Is going to try and cut down on the amount.   Substance and Sexual Activity  . Alcohol use: No    Alcohol/week: 2.0 standard drinks    Types: 2 Cans of beer per week  . Drug use: No  . Sexual activity: Not on file  Lifestyle  . Physical activity:    Days per week: Not on file    Minutes per session: Not on file  . Stress: Not on file  Relationships  . Social connections:    Talks on phone: Not on file    Gets together: Not on file    Attends religious service: Not on file    Active member of club or organization: Not on file    Attends meetings of clubs or organizations: Not on file    Relationship status: Not on file  . Intimate partner violence:    Fear of current or ex partner: Not on file    Emotionally abused: Not on file    Physically abused: Not on file    Forced sexual activity: Not on file  Other Topics Concern  . Not on file  Social History Narrative  . Not on file    Family History  Problem Relation Age of Onset  . Hypertension Mother   . Stroke Father   . Diabetes Maternal Grandmother   . Diabetes Maternal Grandfather   . Leukemia Sister        died - age 62     Current Outpatient Medications:  .   amLODipine (NORVASC) 5 MG tablet, TAKE 1 TABLET BY MOUTH DAILY., Disp: 90 tablet, Rfl: 0 .  donepezil (ARICEPT) 10 MG tablet, Take 1qd, Disp: , Rfl: 5 .  ezetimibe (ZETIA) 10 MG tablet, TAKE 1 TABLET BY MOUTH DAILY, Disp: 90 tablet, Rfl: 0 .  finasteride (PROSCAR) 5 MG tablet, TAKE 1 TABLET BY MOUTH DAILY, Disp: 90 tablet, Rfl: 0 .  folic acid (FOLVITE) 1 MG tablet, Take 1 tablet (1 mg total) daily by mouth., Disp: 90 tablet, Rfl: 1 .  hydrochlorothiazide (MICROZIDE) 12.5 MG capsule, TAKE ONE CAPSULE BY MOUTH DAILY., Disp: 90 capsule, Rfl: 0 .  mirtazapine (REMERON) 15 MG tablet, TAKE 1/2 TABLET BY MOUTH AT BEDTIME (Patient taking differently: Takes one tablet at bedtime), Disp: 15 tablet, Rfl: 4 .  tiZANidine (ZANAFLEX) 4 MG tablet, TAKE 1 TABLET BY MOUTH NIGHTLY, Disp: 30 tablet, Rfl: 4 .  vitamin B-12 (CYANOCOBALAMIN) 1000 MCG tablet, Take 1  tablet (1,000 mcg total) by mouth daily., Disp: 90 tablet, Rfl: 3 .  fluticasone (FLONASE) 50 MCG/ACT nasal spray, Place 2 sprays into both nostrils daily. (Patient not taking: Reported on 12/06/2017), Disp: 16 g, Rfl: 6  Physical exam:  Vitals:   12/06/17 1427  BP: 124/68  Pulse: (!) 56  Resp: 18  Temp: 98.2 F (36.8 C)  TempSrc: Tympanic  Weight: 144 lb (65.3 kg)  Height: '5\' 7"'  (1.702 m)   Physical Exam  Constitutional: He is oriented to person, place, and time.  Thin gentleman who appears mildly fatigued.  Does not appear to be in any acute distress.  He is sitting in a wheelchair  HENT:  Head: Normocephalic and atraumatic.  Eyes: Pupils are equal, round, and reactive to light. EOM are normal.  Neck: Normal range of motion.  Cardiovascular: Normal rate, regular rhythm and normal heart sounds.  Pulmonary/Chest: Effort normal and breath sounds normal.  Abdominal: Soft. Bowel sounds are normal.  Neurological: He is alert and oriented to person, place, and time.  Left lower extremity is in a brace  Skin: Skin is warm and dry.     CMP Latest  Ref Rng & Units 10/24/2017  Glucose 70 - 99 mg/dL 96  BUN 8 - 23 mg/dL 18  Creatinine 0.61 - 1.24 mg/dL 1.20  Sodium 135 - 145 mmol/L 138  Potassium 3.5 - 5.1 mmol/L 3.4(L)  Chloride 98 - 111 mmol/L 103  CO2 22 - 32 mmol/L 26  Calcium 8.9 - 10.3 mg/dL 9.3  Total Protein 6.5 - 8.1 g/dL 7.0  Total Bilirubin 0.3 - 1.2 mg/dL 2.4(H)  Alkaline Phos 38 - 126 U/L 95  AST 15 - 41 U/L 19  ALT 0 - 44 U/L 12   CBC Latest Ref Rng & Units 12/06/2017  WBC 3.8 - 10.6 K/uL 8.2  Hemoglobin 13.0 - 17.0 g/dL 10.3(L)  Hematocrit 39.0 - 52.0 % 28.6(L)  Platelets 150 - 440 K/uL 357     Assessment and plan- Patient is a 67 y.o. male with cold agglutinin hemolytic anemia with a hemoglobin around 10 currently under observation.  He has not required any treatment so far.  He is here for routine follow-up of his anemia  Patient's hemoglobin continues to remain stable around 10.  He does have some ongoing hemolysis as evidenced by total bilirubin that has remained around 2.4-2.7 and haptoglobin remains less than 10.  Given that his hemoglobin has remained stable over the last 1 year without needing any further intervention and the fact that it is hard for the patient to get out of his house because of his MS I will repeat CBC, CMP, reticulocyte count, haptoglobin in 3 in 6 months and see him back in 6 months.  If hemoglobin gets worse I will consider giving him weekly Rituxan at that time.   Visit Diagnosis 1. 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[place])      Dr. Randa Evens, MD, MPH [facility] at [facility] [phone] 12/06/2017 3:50 PM

## 2018-01-08 DIAGNOSIS — S99922A Unspecified injury of left foot, initial encounter: Secondary | ICD-10-CM | POA: Diagnosis not present

## 2018-01-08 DIAGNOSIS — M7989 Other specified soft tissue disorders: Secondary | ICD-10-CM | POA: Diagnosis not present

## 2018-01-08 DIAGNOSIS — Z23 Encounter for immunization: Secondary | ICD-10-CM | POA: Diagnosis not present

## 2018-01-08 DIAGNOSIS — S99912A Unspecified injury of left ankle, initial encounter: Secondary | ICD-10-CM | POA: Diagnosis not present

## 2018-01-08 DIAGNOSIS — M25472 Effusion, left ankle: Secondary | ICD-10-CM | POA: Diagnosis not present

## 2018-01-10 ENCOUNTER — Encounter: Payer: Self-pay | Admitting: Urology

## 2018-01-10 ENCOUNTER — Ambulatory Visit (INDEPENDENT_AMBULATORY_CARE_PROVIDER_SITE_OTHER): Payer: Medicare Other | Admitting: Urology

## 2018-01-10 VITALS — BP 117/45 | HR 39 | Ht 67.0 in | Wt 143.4 lb

## 2018-01-10 DIAGNOSIS — Z8546 Personal history of malignant neoplasm of prostate: Secondary | ICD-10-CM

## 2018-01-10 MED ORDER — FINASTERIDE 5 MG PO TABS: 5.0000 mg | ORAL_TABLET | Freq: Every day | ORAL | 3 refills | Status: DC

## 2018-01-10 NOTE — Progress Notes (Signed)
01/10/2018 3:50 PM   Philip Gordon [birth year] [id number]  Referring provider: Einar Pheasant, [address] [place], [place] [address]  Chief Complaint  Patient presents with  . Establish Care  . Prostate Cancer   Urologic history: 1. T1c adenocarcinoma the prostate low risk -Prostate biopsy January 2011; PSA 6.3; Gleason 3+3 adenocarcinoma 3 cores -Treated prostate brachytherapy May 3784   HPI: 67 year old male presents to establish local urologic care.  He has previously seen Dr. [name] and last saw him at [facility] and September 2017.  Dr. Nicki Reaper has checked his PSA and it was 0.02 in May 2019.  He has no bothersome lower urinary tract symptoms.  He apparently had a bump in his PSA several years ago and was started on finasteride with return of his PSA to very low levels which have been stable.  He denies dysuria or gross hematuria.  Denies flank, abdominal, pelvic or scrotal pain.  He has multiple sclerosis however has no bladder involvement.   PMH: Past Medical History:  Diagnosis Date  . Depression   . Hypercholesterolemia   . Hypertension   . Multiple sclerosis (HCC)    Sees Dr Ala [facility])  . Prostate cancer [facility])    Followed by Dr [name] and Dr [name]    Surgical History: Past Surgical History:  Procedure Laterality Date  . KNEE SURGERY      Home Medications:  Allergies as of 01/10/2018   No Known Allergies     Medication List        Accurate as of 01/10/18  3:50 PM. Always use your most recent med list.          amLODipine 5 MG tablet Commonly known as:  NORVASC TAKE 1 TABLET BY MOUTH DAILY.   diphenhydrAMINE 25 mg capsule Commonly known as:  BENADRYL Take by mouth.   donepezil 10 MG tablet Commonly known as:  ARICEPT Take 1qd   ezetimibe 10 MG tablet Commonly known as:  ZETIA TAKE 1 TABLET BY MOUTH DAILY   finasteride 5 MG tablet Commonly known as:  PROSCAR TAKE 1 TABLET BY MOUTH DAILY   fluticasone 50  MCG/ACT nasal spray Commonly known as:  FLONASE Place 2 sprays into both nostrils daily.   folic acid 1 MG tablet Commonly known as:  FOLVITE Take 1 tablet (1 mg total) daily by mouth.   hydrochlorothiazide 12.5 MG capsule Commonly known as:  MICROZIDE TAKE ONE CAPSULE BY MOUTH DAILY.   mirtazapine 15 MG tablet Commonly known as:  REMERON TAKE 1/2 TABLET BY MOUTH AT BEDTIME   tiZANidine 4 MG tablet Commonly known as:  ZANAFLEX TAKE 1 TABLET BY MOUTH NIGHTLY   vitamin B-12 1000 MCG tablet Commonly known as:  CYANOCOBALAMIN Take 1 tablet (1,000 mcg total) by mouth daily.       Allergies: No Known Allergies  Family History: Family History  Problem Relation Age of Onset  . Hypertension Mother   . Stroke Father   . Diabetes Maternal Grandmother   . Diabetes Maternal Grandfather   . Leukemia Sister        died - age 12    Social History:  reports that he has never smoked. His smokeless tobacco use includes chew. He reports that he does not drink alcohol or use drugs.  ROS: UROLOGY Frequent Urination?: No Hard to postpone urination?: No Burning/pain with urination?: No Get up at night to urinate?: No Leakage of urine?: No Urine stream starts and stops?: No Trouble  starting stream?: No Do you have to strain to urinate?: No Blood in urine?: No Urinary tract infection?: No Sexually transmitted disease?: No Injury to kidneys or bladder?: No Painful intercourse?: No Weak stream?: No Erection problems?: No Penile pain?: No  Gastrointestinal Nausea?: No Vomiting?: No Indigestion/heartburn?: No Diarrhea?: No Constipation?: No  Constitutional Fever: No Night sweats?: No Weight loss?: No Fatigue?: No  Skin Skin rash/lesions?: No Itching?: No  Eyes Blurred vision?: No Double vision?: No  Ears/Nose/Throat Sore throat?: No Sinus problems?: No  Hematologic/Lymphatic Swollen glands?: No Easy bruising?: No  Cardiovascular Leg swelling?: No Chest  pain?: No  Respiratory Cough?: No Shortness of breath?: No  Endocrine Excessive thirst?: No  Musculoskeletal Back pain?: No Joint pain?: No  Neurological Headaches?: No Dizziness?: No  Psychologic Depression?: Yes Anxiety?: Yes  Physical Exam: BP (!) 117/45 (BP Location: Left Arm, Patient Position: Sitting, Cuff Size: Normal)   Pulse (!) 39   Ht 5\' 7"  (1.702 m)   Wt 143 lb 6.4 oz (65 kg)   BMI 22.46 kg/m   Constitutional:  Alert and oriented, No acute distress. HEENT: [name] AT, moist mucus membranes.  Trachea midline, no masses. Cardiovascular: No clubbing, cyanosis, or edema. Respiratory: Normal respiratory effort, no increased work of breathing. Lymph: No cervical or inguinal lymphadenopathy. Skin: No rashes, bruises or suspicious lesions. Neurologic: Wheelchair Psychiatric: Normal mood and affect.   Assessment & Plan:   67 year old male with low risk T1c prostate cancer status post prostate brachytherapy 2011.  PSA the past several years has been low and stable.  Finasteride was refilled.  Follow-up 1 year for recheck/PSA.   Abbie Sons, [place] [address], [place], [place] [address] [phone]

## 2018-01-15 DIAGNOSIS — Z79899 Other long term (current) drug therapy: Secondary | ICD-10-CM | POA: Diagnosis not present

## 2018-01-15 DIAGNOSIS — R2689 Other abnormalities of gait and mobility: Secondary | ICD-10-CM | POA: Diagnosis not present

## 2018-01-15 DIAGNOSIS — R4189 Other symptoms and signs involving cognitive functions and awareness: Secondary | ICD-10-CM | POA: Diagnosis not present

## 2018-01-15 DIAGNOSIS — G35 Multiple sclerosis: Secondary | ICD-10-CM | POA: Diagnosis not present

## 2018-01-15 DIAGNOSIS — E559 Vitamin D deficiency, unspecified: Secondary | ICD-10-CM | POA: Diagnosis not present

## 2018-01-27 DIAGNOSIS — B351 Tinea unguium: Secondary | ICD-10-CM | POA: Diagnosis not present

## 2018-01-27 DIAGNOSIS — M79674 Pain in right toe(s): Secondary | ICD-10-CM | POA: Diagnosis not present

## 2018-01-27 DIAGNOSIS — M79675 Pain in left toe(s): Secondary | ICD-10-CM | POA: Diagnosis not present

## 2018-02-07 ENCOUNTER — Other Ambulatory Visit: Payer: Self-pay

## 2018-03-07 ENCOUNTER — Inpatient Hospital Stay: Payer: Medicare Other

## 2018-03-10 ENCOUNTER — Inpatient Hospital Stay: Payer: Medicare Other | Attending: Oncology

## 2018-03-10 DIAGNOSIS — D591 Other autoimmune hemolytic anemias: Secondary | ICD-10-CM | POA: Diagnosis not present

## 2018-03-10 DIAGNOSIS — C61 Malignant neoplasm of prostate: Secondary | ICD-10-CM | POA: Diagnosis not present

## 2018-03-10 DIAGNOSIS — D5912 Cold autoimmune hemolytic anemia: Secondary | ICD-10-CM

## 2018-03-10 LAB — COMPREHENSIVE METABOLIC PANEL
ALK PHOS: 79 U/L (ref 38–126)
ALT: 13 U/L (ref 0–44)
AST: 19 U/L (ref 15–41)
Albumin: 4.3 g/dL (ref 3.5–5.0)
Anion gap: 8 (ref 5–15)
BUN: 17 mg/dL (ref 8–23)
CHLORIDE: 103 mmol/L (ref 98–111)
CO2: 25 mmol/L (ref 22–32)
CREATININE: 1.31 mg/dL — AB (ref 0.61–1.24)
Calcium: 9.2 mg/dL (ref 8.9–10.3)
GFR calc Af Amer: >60 mL/min (ref >60)
GFR, EST NON AFRICAN AMERICAN: 55 mL/min — AB (ref >60)
Glucose, Bld: 101 mg/dL — ABNORMAL HIGH (ref 70–99)
Potassium: 3.2 mmol/L — ABNORMAL LOW (ref 3.5–5.1)
Sodium: 136 mmol/L (ref 135–145)
Total Bilirubin: 2.4 mg/dL — ABNORMAL HIGH (ref 0.3–1.2)
Total Protein: 6.6 g/dL (ref 6.5–8.1)

## 2018-03-10 LAB — CBC WITH DIFFERENTIAL/PLATELET
Abs Immature Granulocytes: 0.04 10*3/uL (ref 0.00–0.07)
Basophils Absolute: 0 10*3/uL (ref 0.0–0.1)
Basophils Relative: 1 %
EOS PCT: 0 %
Eosinophils Absolute: 0 10*3/uL (ref 0.0–0.5)
HCT: 28 % — ABNORMAL LOW (ref 39.0–52.0)
HEMOGLOBIN: 9 g/dL — AB (ref 13.0–17.0)
Immature Granulocytes: 1 %
LYMPHS ABS: 1.4 10*3/uL (ref 0.7–4.0)
LYMPHS PCT: 17 %
MCH: 31.6 pg (ref 26.0–34.0)
MCHC: 32.1 g/dL (ref 30.0–36.0)
MCV: 98.2 fL (ref 80.0–100.0)
MONO ABS: 0.9 10*3/uL (ref 0.1–1.0)
Monocytes Relative: 11 %
Neutro Abs: 6.1 10*3/uL (ref 1.7–7.7)
Neutrophils Relative %: 70 %
Platelets: 234 10*3/uL (ref 150–400)
RBC: 2.85 MIL/uL — ABNORMAL LOW (ref 4.22–5.81)
RDW: 13.2 % (ref 11.5–15.5)
WBC: 8.5 10*3/uL (ref 4.0–10.5)
nRBC: 0 % (ref 0.0–0.2)

## 2018-03-10 LAB — RETICULOCYTES
IMMATURE RETIC FRACT: 20.4 % — AB (ref 2.3–15.9)
RBC.: 2.85 MIL/uL — ABNORMAL LOW (ref 4.22–5.81)
Retic Count, Absolute: 20 10*3/uL (ref 19.0–186.0)
Retic Ct Pct: 0.7 % (ref 0.4–3.1)

## 2018-03-11 LAB — HAPTOGLOBIN

## 2018-03-20 ENCOUNTER — Telehealth: Payer: Self-pay | Admitting: *Deleted

## 2018-03-20 NOTE — Telephone Encounter (Signed)
Patient is not getting around well with his MS and has fallen several times She reports that Dr Manuella Ghazi has started him on Potassium as for not feeling well, she states "it is generally feeling poor, his MS acting up and Christmas stress is my assumption"

## 2018-03-20 NOTE — Telephone Encounter (Signed)
Enid Derry claiming to be POA called asking about results from the 20th, She reports that he is not feeling well and would like to be told results. I went over results and asked if he is on potassium and she asked if he should be and that maybe that is the reason he feels so bad. Please advise.  )   Ref Range & Units 10d ago (03/10/18) 62mo ago (10/24/17) 62mo ago (07/25/17) 27mo ago (06/03/17) 54mo ago (05/03/17) 15yr ago (01/24/17) 9yr ago (12/20/16)  Retic Ct Pct 0.4 - 3.1 % 0.7  2.5  2.2  2.4  2.7  3.3High   3.3High    RBC. 4.22 - 5.81 MIL/uL 2.85Low   2.73Low  R 0.26Low  R 2.53Low  R 2.85Low  R 2.86Low  R 2.85Low  R  Retic Count, Absolute 19.0 - 186.0 K/uL 20.0  68.3 R, CM 5.7Low  R, CM 60.7 R, CM 77.0 R, CM 94.4 R 94.1 R  Immature Retic Fract 2.3 - 15.9 % 20.4High          Comment: Performed at [facility], [address] [place], [place] [address]  [facility] CL[facility] LAB [facility] CLIN LAB [place] CLIN LAB [place] CLIN LAB [place] CLIN LAB [place] CLIN LAB [place] [facility] LAB      Specimen Collected: 03/10/18 14:45  Last Resulted: 03/10/18 15:56       )   Ref Range & Units 10d ago (03/10/18) 38mo ago (10/24/17) 20mo ago (07/25/17) 7mo ago (06/03/17) 73mo ago (05/03/17) 4yr ago (01/24/17) 75yr ago (12/20/16)  Haptoglobin 32 - 363 mg/dL <10Low   <10Low  R, CM <10Low  R, CM <10Low  R, CM <10Low  R, CM <10Low  R, CM <10Low  R, CM  Comment: (NOTE)        )   Ref Range & Units 10d ago (03/10/18) 31mo ago (10/24/17) 66mo ago (07/25/17) 19mo ago (06/03/17) 35mo ago (05/03/17) 69yr ago (01/24/17) 103yr ago (12/20/16)  Sodium 135 - 145 mmol/L 136  138  138  137  136  135  140   Potassium 3.5 - 5.1 mmol/L 3.2Low   3.4Low   4.3  3.6  3.7  3.3Low   3.7   Chloride 98 - 111 mmol/L 103  103  102 R 103 R 102 R 102 R 102 R  CO2 22 - 32 mmol/L 25  26  27  29  25  25  28    Glucose, Bld 70 - 99 mg/dL 101High   96  102High  R 92 R 128High  R 98 R 90 R  BUN 8 - 23 mg/dL 17  18  13  R 14 R 15 R 17 R 16 R  Creatinine, Ser 0.61 - 1.24 mg/dL  1.31High   1.20  1.17  1.20  1.21  1.29High   1.19   Calcium 8.9 - 10.3 mg/dL 9.2  9.3  9.4  9.0  9.2  9.1  9.2   Total Protein 6.5 - 8.1 g/dL 6.6  7.0  6.8  6.9  6.8  6.6  6.8   Albumin 3.5 - 5.0 g/dL 4.3  4.1  4.0  4.1  4.1  4.0  4.1   AST 15 - 41 U/L 19  19  18  16  24  20  20    ALT 0 - 44 U/L 13  12  9Low  R 9Low  R 12Low  R 13Low  R 17 R  Alkaline Phosphatase 38 -  126 U/L 79  95  98  95  91  89  96   Total Bilirubin 0.3 - 1.2 mg/dL 2.4High   2.4High   2.7High   2.5High   2.2High   2.7High   2.4High    GFR calc non Af Amer >60 mL/min 55Low   60Low   >60  >60  60Low   55Low   >60   GFR calc Af Amer >60 mL/min >60  >60 CM >60 CM >60 CM >60 CM >60 CM >60 CM  Anion gap 5 - 15 8  9  CM 9 CM 5 CM 9 CM 8  10   Comment: Performed at [facility], [address] [place], [place] [address]  Resulting Agency  [place] CLIN LAB [place] CLIN LAB [place] CLIN LAB [place] CLIN LAB [place] CLIN LAB [place] CLIN LAB [place] [facility] LAB      Specimen Collected: 03/10/18 14:45  Last Resulted: 03/10/18 15:34      )   Ref Range & Units 10d ago 30mo ago 68mo ago 74mo ago 70mo ago 55mo ago 16mo ago  WBC 4.0 - 10.5 K/uL 8.5  8.2 R 6.0  6.1  6.6  4.9  5.6   RBC 4.22 - 5.81 MIL/uL 2.85Low   3.12Low  CM 3.38Low  CM 2.87Low   3.13Low  CM 3.06Low  CM 3.05Low  CM  Hemoglobin 13.0 - 17.0 g/dL 9.0Low   10.3Low   11.1Low  R 9.7Low   10.4Low   9.9Low   10.3Low    Comment: CORRECTED FOR COLD AGGLUTININS  RESULTS CONFIRMED BY MANUAL DILUTION   HCT 39.0 - 52.0 % 28.0Low   28.6Low   30.8Low  R 26.7Low   28.5Low   28.2Low   28.1Low    MCV 80.0 - 100.0 fL 98.2  91.7 R 91.1  93.2 R 91.0 R 92.4 R 92.3 R  MCH 26.0 - 34.0 pg 31.6  33.1  32.9  34.0  33.2  32.5  33.7   MCHC 30.0 - 36.0 g/dL 32.1  36.1High   36.1High  R 36.4High   36.5High   35.2  36.5High    RDW 11.5 - 15.5 % 13.2  13.6  13.6 R 14.0  13.1  13.4  14.4   Platelets 150 - 400 K/uL 234  357 R 195 R 332 CM 325 CM 326  354   nRBC 0.0 - 0.2 % 0.0   2High  R 0 R  0 R 0 R  Neutrophils Relative % % 70   72  74  68  69  68  68   Neutro Abs 1.7 - 7.7 K/uL 6.1  5.9 R 4.4 R 4.2 R 4.5  3.4 R 3.7 R  Lymphocytes Relative % 17  15  17  16  19  19  19    Lymphs Abs 0.7 - 4.0 K/uL 1.4  1.3 R 1.0 R 1.0 R 1.2  0.9Low  R 1.1 R  Monocytes Relative % 11  12  8  10  11  10  10    Monocytes Absolute 0.1 - 1.0 K/uL 0.9  1.0 R 0.5 R 0.6 R 0.7  0.5 R 0.6 R  Eosinophils Relative % 0  1  1  2  1  2  1    Eosinophils Absolute 0.0 - 0.5 K/uL 0.0  0.0 R 0.1 R 0.1 R 0.1 R 0.1 R 0.1 R  Basophils Relative % 1  0  0  4  1  0  2   Basophils Absolute 0.0 - 0.1 K/uL 0.0  0.0 R, CM 0.0 R 0.2High  R, CM 0.0 CM 0.0 R, CM 0.1 R, CM  Immature Granulocytes % 1         Abs Immature Granulocytes 0.00 - 0.07 K/uL 0.04         Comment: Performed at [facility], [address] [place], [place] [address]  Band Neutrophils    0  0   0  0   Metamyelocytes Relative    0  0   0  0   Myelocytes    0  0   1  0   Promyelocytes Relative    0  0      Blasts    0  0   0  0   Other    0  0   0  0   RBC Morphology    POLYCHROMASIA PRESENT CM      Smear Review    PLATELET CLUMPS NOTED ON SMEAR, COUNT APPEARS ADEQUATE CM      Promyelocytes Absolute       0  0   Resulting Agency  [place] CLIN LAB [place] CLIN LAB [place] CLIN LAB [place] CLIN LAB [place] CLIN LAB [place] CLIN LAB [place] CLIN LAB      Specimen Collected: 03/10/18 14:45  Last Resulted: 03/10/18 15:56

## 2018-03-31 ENCOUNTER — Ambulatory Visit (INDEPENDENT_AMBULATORY_CARE_PROVIDER_SITE_OTHER): Payer: Medicare Other | Admitting: Internal Medicine

## 2018-03-31 ENCOUNTER — Telehealth: Payer: Self-pay | Admitting: Internal Medicine

## 2018-03-31 ENCOUNTER — Encounter: Payer: Self-pay | Admitting: Internal Medicine

## 2018-03-31 VITALS — BP 110/60 | HR 47 | Temp 97.4°F | Wt 145.0 lb

## 2018-03-31 DIAGNOSIS — D5912 Cold autoimmune hemolytic anemia: Secondary | ICD-10-CM

## 2018-03-31 DIAGNOSIS — Z8546 Personal history of malignant neoplasm of prostate: Secondary | ICD-10-CM | POA: Diagnosis not present

## 2018-03-31 DIAGNOSIS — E7800 Pure hypercholesterolemia, unspecified: Secondary | ICD-10-CM

## 2018-03-31 DIAGNOSIS — D891 Cryoglobulinemia: Secondary | ICD-10-CM | POA: Diagnosis not present

## 2018-03-31 DIAGNOSIS — Z23 Encounter for immunization: Secondary | ICD-10-CM

## 2018-03-31 DIAGNOSIS — C61 Malignant neoplasm of prostate: Secondary | ICD-10-CM | POA: Diagnosis not present

## 2018-03-31 DIAGNOSIS — D591 Other autoimmune hemolytic anemias: Secondary | ICD-10-CM | POA: Diagnosis not present

## 2018-03-31 DIAGNOSIS — I10 Essential (primary) hypertension: Secondary | ICD-10-CM

## 2018-03-31 DIAGNOSIS — G35 Multiple sclerosis: Secondary | ICD-10-CM

## 2018-03-31 MED ORDER — SERTRALINE HCL 25 MG PO TABS: 25.0000 mg | ORAL_TABLET | Freq: Every day | ORAL | 2 refills | Status: DC

## 2018-03-31 NOTE — Telephone Encounter (Signed)
Please call Spero Curb POA with test results.

## 2018-03-31 NOTE — Progress Notes (Signed)
Patient ID: Philip Gordon, male   DOB: 10/02/1950, 68 y.o.   MRN: [id number]   Subjective:    Patient ID: Philip Gordon, male    DOB: 06-21-1950, 68 y.o.   MRN: [id number]  HPI  Patient here for a scheduled follow up.  He is accompanied by two family members.  History obtained from all of them.  Saw Dr Manuella Ghazi 01/15/18 - given tapering prednisone.  Also recommended continuing aricept.  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  Hgb has been relatively stable.   He is living by himself.  Has caretaker that comes in every morning.  Makes his lunch, etc.  Discussed his living situation.  Discussed concern over his safety at home.  He feels he is able to do the things he needs to do.  Discussed assisted living.  He declines.  Is eating.  No nausea or vomiting.  Bowels moving.  Discussed therapy, etc.  Declines.     Past Medical History:  Diagnosis Date  . Depression   . Hypercholesterolemia   . Hypertension   . Multiple sclerosis (HCC)    Sees Dr Ala Bent [facility])  . Prostate cancer [facility])    Followed by Dr [name] and Dr [name]   Past Surgical History:  Procedure Laterality Date  . KNEE SURGERY     Family History  Problem Relation Age of Onset  . Hypertension Mother   . Stroke Father   . Diabetes Maternal Grandmother   . Diabetes Maternal Grandfather   . Leukemia Sister        died - age 27   Social History   Socioeconomic History  . Marital status: Divorced    Spouse name: Not on file  . Number of children: Not on file  . Years of education: Not on file  . Highest education level: Not on file  Occupational History  . Not on file  Social Needs  . Financial resource strain: Not on file  . Food insecurity:    Worry: Not on file    Inability: Not on file  . Transportation needs:    Medical: Not on file    Non-medical: Not on file  Tobacco Use  . Smoking status: Never Smoker  . Smokeless tobacco: Current User    Types: Chew  . Tobacco comment: Is going to try and  cut down on the amount.   Substance and Sexual Activity  . Alcohol use: No    Alcohol/week: 2.0 standard drinks    Types: 2 Cans of beer per week  . Drug use: No  . Sexual activity: Not Currently  Lifestyle  . Physical activity:    Days per week: Not on file    Minutes per session: Not on file  . Stress: Not on file  Relationships  . Social connections:    Talks on phone: Not on file    Gets together: Not on file    Attends religious service: Not on file    Active member of club or organization: Not on file    Attends meetings of clubs or organizations: Not on file    Relationship status: Not on file  Other Topics Concern  . Not on file  Social History Narrative  . Not on file     Review of Systems  Constitutional: Negative for appetite change and unexpected weight change.  HENT: Negative for congestion and sinus pressure.   Respiratory: Negative for cough, chest tightness and shortness of breath.  Cardiovascular: Negative for chest pain, palpitations and leg swelling.  Gastrointestinal: Negative for abdominal pain, diarrhea, nausea and vomiting.  Genitourinary: Negative for difficulty urinating and dysuria.  Musculoskeletal: Negative for joint swelling and myalgias.       Some right shoulder discomfort.  Worse at times.    Skin: Negative for color change and rash.  Neurological: Negative for dizziness, light-headedness and headaches.  Psychiatric/Behavioral: Negative for agitation and dysphoric mood.       Objective:     Pulse:  68  Physical Exam Constitutional:      General: He is not in acute distress.    Appearance: Normal appearance. He is well-developed.  HENT:     Nose: No congestion.     Mouth/Throat:     Pharynx: No oropharyngeal exudate or posterior oropharyngeal erythema.  Neck:     Musculoskeletal: Neck supple. No muscular tenderness.  Cardiovascular:     Rate and Rhythm: Normal rate.     Comments: Rhythm:  Appeared to be regular with premature  beats.   Pulmonary:     Effort: Pulmonary effort is normal. No respiratory distress.     Breath sounds: Normal breath sounds.  Abdominal:     General: Bowel sounds are normal.     Palpations: Abdomen is soft.     Tenderness: There is no abdominal tenderness.  Musculoskeletal:        General: No swelling or tenderness.  Lymphadenopathy:     Cervical: No cervical adenopathy.  Skin:    Findings: No erythema or rash.  Neurological:     Mental Status: He is alert.  Psychiatric:        Mood and Affect: Mood normal.        Behavior: Behavior normal.    BP 110/60 (BP Location: Right Arm, Patient Position: Sitting, Cuff Size: Large)   Pulse (!) 47   Temp (!) 97.4 F (36.3 C)   Wt 145 lb (65.8 kg)   SpO2 100%   BMI 22.71 kg/m  Wt Readings from Last 3 Encounters:  03/31/18 145 lb (65.8 kg)  01/10/18 143 lb 6.4 oz (65 kg)  12/06/17 144 lb (65.3 kg)     Lab Results  Component Value Date   WBC 8.5 03/10/2018   HGB 9.0 (L) 03/10/2018   HCT 28.0 (L) 03/10/2018   PLT 234 03/10/2018   GLUCOSE 80 03/31/2018   CHOL 145 03/31/2018   TRIG 130.0 03/31/2018   HDL 44.70 03/31/2018   LDLDIRECT 129.6 01/30/2013   LDLCALC 74 03/31/2018   ALT 34 03/31/2018   AST 25 03/31/2018   NA 137 03/31/2018   K 4.5 03/31/2018   CL 104 03/31/2018   CREATININE 1.01 03/31/2018   BUN 18 03/31/2018   CO2 28 03/31/2018   TSH 1.29 03/31/2018   PSA 0.01 (L) 09/04/2016       Assessment & Plan:   Problem List Items Addressed This Visit    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[place])    Followed by hematology.        History of prostate cancer    Previously followed by Dr [name].  Sees oncology.  PSA wnl - last check.        Hypercholesteremia - Primary    Unable to take statin medication.  Has been taking zetia.  Follow lipid panel.        Relevant Orders   Hepatic function panel (Completed)  Lipid panel (Completed)   Hypertension     Blood pressure under good control.  Continue same medication regimen.  Follow pressures.  Follow metabolic panel.        Relevant Orders   TSH (Completed)   Basic metabolic panel (Completed)   Malignant neoplasm of prostate ([place])   Multiple sclerosis ([place])    Followed by Dr Manuella Ghazi.  Discussed his living situation.  Discussed assisted living.  Discussed care at home.  He declines assisted living.  Follow.         Other Visit Diagnoses    Need for vaccination with 13-polyvalent pneumococcal conjugate vaccine       Relevant Orders   Pneumococcal conjugate vaccine 13-valent (Completed)       Einar Pheasant, MD

## 2018-03-31 NOTE — Telephone Encounter (Signed)
Noted  

## 2018-04-01 LAB — LIPID PANEL
Cholesterol: 145 mg/dL (ref 0–200)
HDL: 44.7 mg/dL (ref >39.00)
LDL Cholesterol: 74 mg/dL (ref 0–99)
NonHDL: 100.42
Total CHOL/HDL Ratio: 3
Triglycerides: 130 mg/dL (ref 0.0–149.0)
VLDL: 26 mg/dL (ref 0.0–40.0)

## 2018-04-01 LAB — BASIC METABOLIC PANEL
BUN: 18 mg/dL (ref 6–23)
CHLORIDE: 104 meq/L (ref 96–112)
CO2: 28 mEq/L (ref 19–32)
Calcium: 8.9 mg/dL (ref 8.4–10.5)
Creatinine, Ser: 1.01 mg/dL (ref 0.40–1.50)
GFR: 77.69 mL/min (ref >60.00)
Glucose, Bld: 80 mg/dL (ref 70–99)
POTASSIUM: 4.5 meq/L (ref 3.5–5.1)
Sodium: 137 mEq/L (ref 135–145)

## 2018-04-01 LAB — HEPATIC FUNCTION PANEL
ALBUMIN: 3.9 g/dL (ref 3.5–5.2)
ALT: 34 U/L (ref 0–53)
AST: 25 U/L (ref 0–37)
Alkaline Phosphatase: 68 U/L (ref 39–117)
Bilirubin, Direct: 0.3 mg/dL (ref 0.0–0.3)
Total Bilirubin: 1.9 mg/dL — ABNORMAL HIGH (ref 0.2–1.2)
Total Protein: 5.9 g/dL — ABNORMAL LOW (ref 6.0–8.3)

## 2018-04-01 LAB — TSH: TSH: 1.29 u[IU]/mL (ref 0.35–4.50)

## 2018-04-05 ENCOUNTER — Encounter: Payer: Self-pay | Admitting: Internal Medicine

## 2018-04-05 NOTE — Assessment & Plan Note (Signed)
Followed by hematology 

## 2018-04-05 NOTE — Assessment & Plan Note (Signed)
Previously followed by Dr [name].  Sees oncology.  PSA wnl - last check.

## 2018-04-05 NOTE — Assessment & Plan Note (Signed)
Unable to take statin medication.  Has been taking zetia.  Follow lipid panel.

## 2018-04-05 NOTE — Assessment & Plan Note (Signed)
Followed by Dr Manuella Ghazi.  Discussed his living situation.  Discussed assisted living.  Discussed care at home.  He declines assisted living.  Follow.

## 2018-04-05 NOTE — Assessment & Plan Note (Signed)
Blood pressure under good control.  Continue same medication regimen.  Follow pressures.  Follow metabolic panel.   

## 2018-04-14 ENCOUNTER — Other Ambulatory Visit: Payer: Self-pay | Admitting: Internal Medicine

## 2018-04-22 ENCOUNTER — Other Ambulatory Visit: Payer: Self-pay | Admitting: Internal Medicine

## 2018-06-06 ENCOUNTER — Other Ambulatory Visit: Payer: Medicare Other

## 2018-06-06 ENCOUNTER — Telehealth: Payer: Self-pay | Admitting: *Deleted

## 2018-06-06 ENCOUNTER — Ambulatory Visit: Payer: Medicare Other | Admitting: Oncology

## 2018-06-06 NOTE — Telephone Encounter (Signed)
Early I called and talked about changing her brother-in-law's appointment due to all the flu as well as the coronavirus.  He says that she is going to give it a couple weeks and then she will call us back and reschedule him because he is just too at risk of getting anything and she does not want to be around in crowds and get him exposed anything.  Eduard Clos will call back in a couple weeks and give [place] an update of when we can reschedule him

## 2018-06-09 ENCOUNTER — Other Ambulatory Visit: Payer: Medicare Other

## 2018-06-09 ENCOUNTER — Ambulatory Visit: Payer: Medicare Other | Admitting: Oncology

## 2018-06-12 ENCOUNTER — Ambulatory Visit: Payer: Medicare Other | Admitting: Oncology

## 2018-06-12 ENCOUNTER — Other Ambulatory Visit: Payer: Medicare Other

## 2018-06-30 ENCOUNTER — Ambulatory Visit: Payer: Medicare Other | Admitting: Internal Medicine

## 2018-07-01 ENCOUNTER — Other Ambulatory Visit: Payer: Self-pay | Admitting: Internal Medicine

## 2018-07-11 ENCOUNTER — Encounter: Payer: Self-pay | Admitting: Internal Medicine

## 2018-07-11 ENCOUNTER — Other Ambulatory Visit: Payer: Self-pay

## 2018-07-11 ENCOUNTER — Ambulatory Visit (INDEPENDENT_AMBULATORY_CARE_PROVIDER_SITE_OTHER): Payer: Medicare Other | Admitting: Internal Medicine

## 2018-07-11 DIAGNOSIS — D5912 Cold autoimmune hemolytic anemia: Secondary | ICD-10-CM

## 2018-07-11 DIAGNOSIS — I10 Essential (primary) hypertension: Secondary | ICD-10-CM | POA: Diagnosis not present

## 2018-07-11 DIAGNOSIS — E7800 Pure hypercholesterolemia, unspecified: Secondary | ICD-10-CM

## 2018-07-11 DIAGNOSIS — Z8546 Personal history of malignant neoplasm of prostate: Secondary | ICD-10-CM

## 2018-07-11 DIAGNOSIS — G35 Multiple sclerosis: Secondary | ICD-10-CM

## 2018-07-11 DIAGNOSIS — D591 Other autoimmune hemolytic anemias: Secondary | ICD-10-CM

## 2018-07-11 DIAGNOSIS — G35D Multiple sclerosis, unspecified: Secondary | ICD-10-CM

## 2018-07-11 MED ORDER — SERTRALINE HCL 25 MG PO TABS: 25.0000 mg | ORAL_TABLET | Freq: Every day | ORAL | 1 refills | Status: DC

## 2018-07-11 NOTE — Progress Notes (Addendum)
Patient ID: Philip Gordon, male   DOB: 07-06-1950, 68 y.o.   MRN: [id number] Virtual Visit via Telephone:  Note  This visit type was conducted due to national recommendations for restrictions regarding the COVID-19 pandemic (e.g. social distancing).  This format is felt to be most appropriate for this patient at this time.  All issues noted in this document were discussed and addressed.  No physical exam was performed (except for noted visual exam findings with Video Visits).   I connected with Philip Gordon on 07/11/18 at  3:00 PM EDT by telephone and verified that I am speaking with the correct person using two identifiers. Location patient: home Location provider: work Persons participating in the telephone visit: patient, provider  I discussed the limitations, risks, security and privacy concerns of performing an evaluation and management service by telephone and the availability of in person appointments.. The patient expressed understanding and agreed to proceed.   Reason for visit: scheduled follow up appt.    HPI: He reports he is feeling better.  On zoloft and feels that he is doing well on this medication.  Feels that it has helped.  Handling stress. Has caretakers who help him with daily activities.  He feels he is able to do the things he needs to do around the house.  continues to decline assisted living.  No chest pain.  No sob.  No cough or congestion.  No fever.  No known COVID exposure.  Saw neurology.  On aricept.  Also being followed by Dr Janese Banks for cold agglutinin disease.  He is eating.  States appetite is good.  Overall feels things are stable.     ROS: See pertinent positives and negatives per HPI.  Past Medical History:  Diagnosis Date  . Depression   . Hypercholesterolemia   . Hypertension   . Multiple sclerosis (HCC)    Sees Dr Ala Bent [facility])  . Prostate cancer [facility])    Followed by Dr [name] and Dr [name]    Past Surgical History:  Procedure Laterality  Date  . KNEE SURGERY      Family History  Problem Relation Age of Onset  . Hypertension Mother   . Stroke Father   . Diabetes Maternal Grandmother   . Diabetes Maternal Grandfather   . Leukemia Sister        died - age 70    SOCIAL HX: reviewed.    Current Outpatient Medications:  .  amLODipine (NORVASC) 5 MG tablet, TAKE 1 TABLET BY MOUTH DAILY., Disp: 90 tablet, Rfl: 0 .  diphenhydrAMINE (BENADRYL) 25 mg capsule, Take by mouth., Disp: , Rfl:  .  donepezil (ARICEPT) 10 MG tablet, Take 1qd, Disp: , Rfl: 5 .  ezetimibe (ZETIA) 10 MG tablet, TAKE 1 TABLET BY MOUTH DAILY., Disp: 90 tablet, Rfl: 0 .  finasteride (PROSCAR) 5 MG tablet, TAKE 1 TABLET BY MOUTH DAILY, Disp: 90 tablet, Rfl: 3 .  fluticasone (FLONASE) 50 MCG/ACT nasal spray, Place 2 sprays into both nostrils daily., Disp: 16 g, Rfl: 6 .  folic acid (FOLVITE) 1 MG tablet, Take 1 tablet (1 mg total) daily by mouth., Disp: 90 tablet, Rfl: 1 .  hydrochlorothiazide (MICROZIDE) 12.5 MG capsule, TAKE 1 CAPSULE BY MOUTH DAILY., Disp: 90 capsule, Rfl: 0 .  mirtazapine (REMERON) 15 MG tablet, TAKE 1/2 TABLET BY MOUTH AT BEDTIME (Patient taking differently: Takes one tablet at bedtime), Disp: 15 tablet, Rfl: 4 .  POTASSIUM PO, Take 99 mg by mouth daily., Disp: ,  Rfl:  .  sertraline (ZOLOFT) 25 MG tablet, Take 1 tablet (25 mg total) by mouth daily., Disp: 90 tablet, Rfl: 1 .  tiZANidine (ZANAFLEX) 4 MG tablet, TAKE 1 TABLET BY MOUTH NIGHTLY, Disp: 30 tablet, Rfl: 4 .  vitamin B-12 (CYANOCOBALAMIN) 1000 MCG tablet, Take 1 tablet (1,000 mcg total) by mouth daily., Disp: 90 tablet, Rfl: 3  EXAM:  GENERAL: alert.  Sounds to be in no acute distress.  Answering questions appropriately.    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   History of prostate cancer Previously followed by Dr [name].  Has seen oncology.    Hypercholesteremia Unable to take statin medication.  Taking zetia.  Follow lipid panel.  Low cholesterol diet and exercise.    Hypertension Blood pressure has been doing well.  Continue current medication.  Follow metabolic panel.   Multiple sclerosis ([place]) Followed by Dr Manuella Ghazi.  Have discussed his living situation.  Again discussed assisted living.  He feels he is doing well at home.  Has caretaker.  Follow.      I discussed the assessment and treatment plan with the patient. The patient was provided an opportunity to ask questions and all were answered. The patient agreed with the plan and demonstrated an understanding of the instructions.   The patient was advised to call back or seek an in-person evaluation if the symptoms worsen or if the condition fails to improve as anticipated.  I provided 25 minutes of non-face-to-face time during this encounter.   Einar Pheasant, MD

## 2018-07-13 ENCOUNTER — Encounter: Payer: Self-pay | Admitting: Internal Medicine

## 2018-07-13 NOTE — Assessment & Plan Note (Signed)
Blood pressure has been doing well.  Continue current medication.  Follow metabolic panel.

## 2018-07-13 NOTE — Assessment & Plan Note (Signed)
Unable to take statin medication.  Taking zetia.  Follow lipid panel.  Low cholesterol diet and exercise.

## 2018-07-13 NOTE — Assessment & Plan Note (Signed)
Followed by Dr Manuella Ghazi.  Have discussed his living situation.  Again discussed assisted living.  He feels he is doing well at home.  Has caretaker.  Follow.

## 2018-07-13 NOTE — Assessment & Plan Note (Signed)
Previously followed by Dr [name].  Has seen oncology.

## 2018-07-13 NOTE — Assessment & Plan Note (Signed)
Has been evaluated by hematology.

## 2018-09-16 DIAGNOSIS — G35 Multiple sclerosis: Secondary | ICD-10-CM | POA: Diagnosis not present

## 2018-10-13 ENCOUNTER — Telehealth: Payer: Self-pay | Admitting: Internal Medicine

## 2018-10-13 NOTE — Telephone Encounter (Signed)
Pt called in and stated he needs a refill on "all" his meds.  He could not tell me which ones.    Pharmacy - Mail order  [place], [place] [phone] (Phone)   Best number  [phone]

## 2018-10-14 ENCOUNTER — Other Ambulatory Visit: Payer: Self-pay

## 2018-10-14 MED ORDER — FINASTERIDE 5 MG PO TABS: 5.0000 mg | ORAL_TABLET | Freq: Every day | ORAL | 1 refills | Status: DC

## 2018-10-14 MED ORDER — HYDROCHLOROTHIAZIDE 12.5 MG PO CAPS: ORAL_CAPSULE | ORAL | 1 refills | Status: DC

## 2018-10-14 MED ORDER — SERTRALINE HCL 25 MG PO TABS: 25.0000 mg | ORAL_TABLET | Freq: Every day | ORAL | 1 refills | Status: DC

## 2018-10-14 MED ORDER — AMLODIPINE BESYLATE 5 MG PO TABS: 5.0000 mg | ORAL_TABLET | Freq: Every day | ORAL | 1 refills | Status: DC

## 2018-10-14 MED ORDER — EZETIMIBE 10 MG PO TABS: 10.0000 mg | ORAL_TABLET | Freq: Every day | ORAL | 1 refills | Status: DC

## 2018-10-14 NOTE — Telephone Encounter (Signed)
rx sent in. Pt aware

## 2018-10-17 ENCOUNTER — Telehealth: Payer: Self-pay | Admitting: Internal Medicine

## 2018-10-17 NOTE — Telephone Encounter (Signed)
Caller name: Arrielle  Relation to pt: from [employer] back number: [phone]    Reason for call:  pharamcist wanted to make PCP sertraline (ZOLOFT) 25 MG tablet and mirtazapine (REMERON) 15 MG tablet drug interaction, please advise

## 2018-10-20 NOTE — Telephone Encounter (Signed)
Left message for Philip Gordon at [facility]. Per pharmacy, rx for remeron was sent in on 10/17/18 by Dr Manuella Ghazi

## 2018-10-20 NOTE — Telephone Encounter (Signed)
I am not showing that he is on remeron. Per our records, has not been refilled since 2016.  Do they have something different?  Is he taking the medication?

## 2018-11-03 ENCOUNTER — Telehealth: Payer: Self-pay | Admitting: Internal Medicine

## 2018-11-03 ENCOUNTER — Encounter: Payer: Self-pay | Admitting: Internal Medicine

## 2018-11-03 NOTE — Telephone Encounter (Signed)
Medication Refill - Medication: sertraline (ZOLOFT) 25 MG tablet (Patient stated that pharmacy hasnt been able to reach office.)   Has the patient contacted their pharmacy? Yes (Agent: If no, request that the patient contact the pharmacy for the refill.) (Agent: If yes, when and what did the pharmacy advise?)Contact PCP  Preferred Pharmacy (with phone number or street name):  [facility] [place], [place] [phone] (Phone) [phone] (Fax)     Agent: Please be advised that RX refills may take up to 3 business days. We ask that you follow-up with your pharmacy.

## 2018-11-03 NOTE — Telephone Encounter (Signed)
Spoke with pt. He has a week supply left. Pharmacy advised that he will get 2 day shipping. Being sent out today

## 2018-11-03 NOTE — Telephone Encounter (Signed)
Need to clarify if pharmacy received rx and if pt aware at pharmacy

## 2018-11-03 NOTE — Telephone Encounter (Signed)
Also, if he is out of medication, will need a few tablets locally to get him through until mail order available.

## 2018-11-03 NOTE — Telephone Encounter (Signed)
Rx for Zoloft was refilled on 10/14/18 for 90 tablets with 1 refill.  Pt should not need a refill.

## 2018-11-20 ENCOUNTER — Ambulatory Visit: Payer: Medicare Other

## 2018-11-20 ENCOUNTER — Ambulatory Visit: Payer: Medicare Other | Admitting: Internal Medicine

## 2018-12-11 ENCOUNTER — Telehealth: Payer: Self-pay

## 2018-12-11 NOTE — Telephone Encounter (Signed)
Copied from [place] [id number]. Topic: General - Other >> Dec 11, 2018 11:45 AM Yvette Rack wrote: Reason for CRM: Olene Floss [facility]) stated pt received a call to confirm whether pt would like the appt to be by phone or in office. Enid Derry stated pt has MS and prefers a phone appt.

## 2018-12-11 NOTE — Telephone Encounter (Signed)
appt set up as phone call

## 2018-12-16 ENCOUNTER — Ambulatory Visit (INDEPENDENT_AMBULATORY_CARE_PROVIDER_SITE_OTHER): Payer: Medicare Other | Admitting: Internal Medicine

## 2018-12-16 ENCOUNTER — Other Ambulatory Visit: Payer: Self-pay

## 2018-12-16 ENCOUNTER — Encounter: Payer: Self-pay | Admitting: Internal Medicine

## 2018-12-16 DIAGNOSIS — G35 Multiple sclerosis: Secondary | ICD-10-CM

## 2018-12-16 DIAGNOSIS — E538 Deficiency of other specified B group vitamins: Secondary | ICD-10-CM

## 2018-12-16 DIAGNOSIS — E7800 Pure hypercholesterolemia, unspecified: Secondary | ICD-10-CM | POA: Diagnosis not present

## 2018-12-16 DIAGNOSIS — I10 Essential (primary) hypertension: Secondary | ICD-10-CM | POA: Diagnosis not present

## 2018-12-16 DIAGNOSIS — D591 Other autoimmune hemolytic anemias: Secondary | ICD-10-CM | POA: Diagnosis not present

## 2018-12-16 DIAGNOSIS — D5912 Cold autoimmune hemolytic anemia: Secondary | ICD-10-CM

## 2018-12-16 NOTE — Progress Notes (Signed)
Patient ID: Philip Gordon, male   DOB: Jun 29, 1950, 68 y.o.   MRN: [id number]   Virtual Visit via telephone Note  This visit type was conducted due to national recommendations for restrictions regarding the COVID-19 pandemic (e.g. social distancing).  This format is felt to be most appropriate for this patient at this time.  All issues noted in this document were discussed and addressed.  No physical exam was performed (except for noted visual exam findings with Video Visits).   I connected with Durenda Hurt by telephone and verified that I am speaking with the correct person using two identifiers. Location patient: home Location provider: work Persons participating in the telephone visit: patient, provider  I discussed the limitations, risks, security and privacy concerns of performing an evaluation and management service by telephone and the availability of in person appointments.  The patient expressed understanding and agreed to proceed.   Reason for visit: scheduled follow up  HPI: Overall he feels things are relatively stable.  Staying in.  Has someone coming in to help with food, etc.  No chest pain.  No sob.  States he has a good appetite.  No nausea or vomiting.  Bowels moving.  Discussed melatonin for sleep.  Handling stress.     ROS: See pertinent positives and negatives per HPI.  Past Medical History:  Diagnosis Date  . Depression   . Hypercholesterolemia   . Hypertension   . Multiple sclerosis (HCC)    Sees Dr Ala Bent [facility])  . Prostate cancer [facility])    Followed by Dr [name] and Dr [name]    Past Surgical History:  Procedure Laterality Date  . KNEE SURGERY      Family History  Problem Relation Age of Onset  . Hypertension Mother   . Stroke Father   . Diabetes Maternal Grandmother   . Diabetes Maternal Grandfather   . Leukemia Sister        died - age 76    SOCIAL HX: reviewed.    Current Outpatient Medications:  .  amLODipine (NORVASC) 5 MG  tablet, Take 1 tablet (5 mg total) by mouth daily., Disp: 90 tablet, Rfl: 1 .  diphenhydrAMINE (BENADRYL) 25 mg capsule, Take by mouth., Disp: , Rfl:  .  donepezil (ARICEPT) 10 MG tablet, Take 1qd, Disp: , Rfl: 5 .  ezetimibe (ZETIA) 10 MG tablet, Take 1 tablet (10 mg total) by mouth daily., Disp: 90 tablet, Rfl: 1 .  finasteride (PROSCAR) 5 MG tablet, Take 1 tablet (5 mg total) by mouth daily., Disp: 90 tablet, Rfl: 1 .  hydrochlorothiazide (MICROZIDE) 12.5 MG capsule, TAKE 1 CAPSULE BY MOUTH DAILY., Disp: 90 capsule, Rfl: 1 .  mirtazapine (REMERON) 15 MG tablet, TAKE 1/2 TABLET BY MOUTH AT BEDTIME (Patient taking differently: Takes one tablet at bedtime), Disp: 15 tablet, Rfl: 4 .  sertraline (ZOLOFT) 25 MG tablet, Take 1 tablet (25 mg total) by mouth daily., Disp: 90 tablet, Rfl: 1 .  tiZANidine (ZANAFLEX) 4 MG tablet, TAKE 1 TABLET BY MOUTH NIGHTLY, Disp: 30 tablet, Rfl: 4 .  vitamin B-12 (CYANOCOBALAMIN) 1000 MCG tablet, Take 1 tablet (1,000 mcg total) by mouth daily., Disp: 90 tablet, Rfl: 3 .  fluticasone (FLONASE) 50 MCG/ACT nasal spray, Place 2 sprays into both nostrils daily., Disp: 16 g, Rfl: 6 .  POTASSIUM PO, Take 99 mg by mouth daily., Disp: , Rfl:   EXAM:  GENERAL: alert.  Answering questions appropriately.  Sounds to be in no acute distress.  PSYCH/NEURO: pleasant and cooperative, no obvious depression or anxiety, speech and thought processing grossly intact  ASSESSMENT AND PLAN:  Discussed the following assessment and plan:  B12 deficiency Will need f/u B12 level.    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   Hypercholesteremia Unable to take statin medication.  Has been on zetia.  Follow lipid panel.  Low cholesterol diet and exercise.    Hypertension Blood pressure has been under good control.  Continue same medication regimen.  Follow pressures.  Follow metabolic panel.    Multiple sclerosis ([place]) Followed by Dr Manuella Ghazi.      I  discussed the assessment and treatment plan with the patient. The patient was provided an opportunity to ask questions and all were answered. The patient agreed with the plan and demonstrated an understanding of the instructions.   The patient was advised to call back or seek an in-person evaluation if the symptoms worsen or if the condition fails to improve as anticipated.  I provided 15 minutes of non-face-to-face time during this encounter.   Einar Pheasant, MD

## 2018-12-21 NOTE — Assessment & Plan Note (Signed)
Has been evaluated by hematology.  

## 2018-12-21 NOTE — Assessment & Plan Note (Signed)
Unable to take statin medication.  Has been on zetia.  Follow lipid panel.  Low cholesterol diet and exercise.

## 2018-12-21 NOTE — Assessment & Plan Note (Signed)
Blood pressure has been under good control.  Continue same medication regimen.  Follow pressures.  Follow metabolic panel.   

## 2018-12-21 NOTE — Assessment & Plan Note (Signed)
Followed by Dr. Shah. 

## 2018-12-21 NOTE — Assessment & Plan Note (Signed)
Will need f/u B12 level.

## 2019-01-01 DIAGNOSIS — Z23 Encounter for immunization: Secondary | ICD-10-CM | POA: Diagnosis not present

## 2019-01-12 ENCOUNTER — Ambulatory Visit: Payer: Medicare Other | Admitting: Urology

## 2019-01-15 ENCOUNTER — Other Ambulatory Visit: Payer: Self-pay | Admitting: Neurology

## 2019-01-15 ENCOUNTER — Other Ambulatory Visit (HOSPITAL_COMMUNITY): Payer: Self-pay | Admitting: Neurology

## 2019-01-15 DIAGNOSIS — G35 Multiple sclerosis: Secondary | ICD-10-CM

## 2019-01-21 ENCOUNTER — Inpatient Hospital Stay
Admission: EM | Admit: 2019-01-21 | Discharge: 2019-01-27 | DRG: 059 | Disposition: A | Discharge status: Skilled Nursing Facility | Payer: Medicare Other | Attending: Internal Medicine | Admitting: Internal Medicine

## 2019-01-21 ENCOUNTER — Ambulatory Visit: Admission: RE | Admit: 2019-01-21 | Payer: Medicare Other | Source: Ambulatory Visit

## 2019-01-21 DIAGNOSIS — I10 Essential (primary) hypertension: Secondary | ICD-10-CM | POA: Diagnosis not present

## 2019-01-21 DIAGNOSIS — F329 Major depressive disorder, single episode, unspecified: Secondary | ICD-10-CM | POA: Diagnosis not present

## 2019-01-21 DIAGNOSIS — W19XXXA Unspecified fall, initial encounter: Secondary | ICD-10-CM | POA: Diagnosis not present

## 2019-01-21 DIAGNOSIS — M6282 Rhabdomyolysis: Secondary | ICD-10-CM | POA: Diagnosis not present

## 2019-01-21 DIAGNOSIS — G35D Multiple sclerosis, unspecified: Secondary | ICD-10-CM | POA: Diagnosis present

## 2019-01-21 DIAGNOSIS — R531 Weakness: Secondary | ICD-10-CM | POA: Diagnosis not present

## 2019-01-21 DIAGNOSIS — G35 Multiple sclerosis: Principal | ICD-10-CM | POA: Diagnosis present

## 2019-01-21 DIAGNOSIS — N179 Acute kidney failure, unspecified: Secondary | ICD-10-CM | POA: Diagnosis not present

## 2019-01-21 DIAGNOSIS — Z20828 Contact with and (suspected) exposure to other viral communicable diseases: Secondary | ICD-10-CM | POA: Diagnosis present

## 2019-01-21 DIAGNOSIS — Z8249 Family history of ischemic heart disease and other diseases of the circulatory system: Secondary | ICD-10-CM

## 2019-01-21 DIAGNOSIS — I129 Hypertensive chronic kidney disease with stage 1 through stage 4 chronic kidney disease, or unspecified chronic kidney disease: Secondary | ICD-10-CM | POA: Diagnosis present

## 2019-01-21 DIAGNOSIS — R296 Repeated falls: Secondary | ICD-10-CM | POA: Diagnosis present

## 2019-01-21 DIAGNOSIS — E785 Hyperlipidemia, unspecified: Secondary | ICD-10-CM | POA: Diagnosis present

## 2019-01-21 DIAGNOSIS — Z79899 Other long term (current) drug therapy: Secondary | ICD-10-CM

## 2019-01-21 DIAGNOSIS — E7800 Pure hypercholesterolemia, unspecified: Secondary | ICD-10-CM | POA: Diagnosis present

## 2019-01-21 DIAGNOSIS — R001 Bradycardia, unspecified: Secondary | ICD-10-CM | POA: Diagnosis not present

## 2019-01-21 DIAGNOSIS — Z8546 Personal history of malignant neoplasm of prostate: Secondary | ICD-10-CM

## 2019-01-21 DIAGNOSIS — F17220 Nicotine dependence, chewing tobacco, uncomplicated: Secondary | ICD-10-CM | POA: Diagnosis present

## 2019-01-21 DIAGNOSIS — N183 Chronic kidney disease, stage 3 unspecified: Secondary | ICD-10-CM | POA: Diagnosis present

## 2019-01-21 DIAGNOSIS — E538 Deficiency of other specified B group vitamins: Secondary | ICD-10-CM | POA: Diagnosis present

## 2019-01-21 LAB — CBC WITH DIFFERENTIAL/PLATELET
Abs Immature Granulocytes: 0.07 10*3/uL (ref 0.00–0.07)
Basophils Absolute: 0.1 10*3/uL (ref 0.0–0.1)
Basophils Relative: 1 %
Eosinophils Absolute: 0 10*3/uL (ref 0.0–0.5)
Eosinophils Relative: 0 %
HCT: 30.6 % — ABNORMAL LOW (ref 39.0–52.0)
Hemoglobin: 10.4 g/dL — ABNORMAL LOW (ref 13.0–17.0)
Immature Granulocytes: 1 %
Lymphocytes Relative: 10 %
Lymphs Abs: 1.1 10*3/uL (ref 0.7–4.0)
MCH: 33.1 pg (ref 26.0–34.0)
MCHC: 34 g/dL (ref 30.0–36.0)
MCV: 97.5 fL (ref 80.0–100.0)
Monocytes Absolute: 1 10*3/uL (ref 0.1–1.0)
Monocytes Relative: 9 %
Neutro Abs: 8.7 10*3/uL — ABNORMAL HIGH (ref 1.7–7.7)
Neutrophils Relative %: 79 %
Platelets: 343 10*3/uL (ref 150–400)
RBC: 3.14 MIL/uL — ABNORMAL LOW (ref 4.22–5.81)
RDW: 13.7 % (ref 11.5–15.5)
WBC: 10.9 10*3/uL — ABNORMAL HIGH (ref 4.0–10.5)
nRBC: 0 % (ref 0.0–0.2)

## 2019-01-21 LAB — COMPREHENSIVE METABOLIC PANEL
ALT: 14 U/L (ref 0–44)
AST: 28 U/L (ref 15–41)
Albumin: 4.7 g/dL (ref 3.5–5.0)
Alkaline Phosphatase: 108 U/L (ref 38–126)
Anion gap: 11 (ref 5–15)
BUN: 21 mg/dL (ref 8–23)
CO2: 25 mmol/L (ref 22–32)
Calcium: 9.3 mg/dL (ref 8.9–10.3)
Chloride: 100 mmol/L (ref 98–111)
Creatinine, Ser: 1.45 mg/dL — ABNORMAL HIGH (ref 0.61–1.24)
GFR calc Af Amer: 56 mL/min — ABNORMAL LOW (ref >60)
GFR calc non Af Amer: 48 mL/min — ABNORMAL LOW (ref >60)
Glucose, Bld: 120 mg/dL — ABNORMAL HIGH (ref 70–99)
Potassium: 4.7 mmol/L (ref 3.5–5.1)
Sodium: 136 mmol/L (ref 135–145)
Total Bilirubin: 2.9 mg/dL — ABNORMAL HIGH (ref 0.3–1.2)
Total Protein: 7.8 g/dL (ref 6.5–8.1)

## 2019-01-21 LAB — URINALYSIS, COMPLETE (UACMP) WITH MICROSCOPIC
Bacteria, UA: NONE SEEN
Bilirubin Urine: NEGATIVE
Glucose, UA: NEGATIVE mg/dL
Hgb urine dipstick: NEGATIVE
Ketones, ur: NEGATIVE mg/dL
Leukocytes,Ua: NEGATIVE
Nitrite: NEGATIVE
Protein, ur: NEGATIVE mg/dL
Specific Gravity, Urine: 1.015 (ref 1.005–1.030)
Squamous Epithelial / HPF: NONE SEEN (ref 0–5)
pH: 6 (ref 5.0–8.0)

## 2019-01-21 LAB — TROPONIN I (HIGH SENSITIVITY): Troponin I (High Sensitivity): 8 ng/L (ref <18)

## 2019-01-21 LAB — LACTIC ACID, PLASMA
Lactic Acid, Venous: 1.5 mmol/L (ref 0.5–1.9)
Lactic Acid, Venous: 1.3 mmol/L (ref 0.5–1.9)

## 2019-01-21 LAB — CK: Total CK: 403 U/L — ABNORMAL HIGH (ref 49–397)

## 2019-01-21 MED ORDER — SODIUM CHLORIDE 0.9 % IV BOLUS
500.0000 mL | Freq: Once | INTRAVENOUS | Status: AC
  Administered 2019-01-21: 500 mL via INTRAVENOUS

## 2019-01-21 NOTE — ED Triage Notes (Signed)
Pt arrived via EMS from home where family reported pt has fallen at least 4 times in last 24 hours. Pt was talked into coming to ED due to EMS seeing pt not able to go from point to point. Pts baseline is pt able to move by small steps with a walker. Pt is A&O x4. EMS reports medication change of Gabapentin 100mg  TID as of 10/27.

## 2019-01-21 NOTE — H&P (Signed)
[facility] [employer] NAME: Philip Gordon    MR#:  [id number]  DATE OF BIRTH:  1950-07-16  DATE OF ADMISSION:  01/21/2019  PRIMARY CARE PHYSICIAN: Einar Pheasant, MD   REQUESTING/REFERRING PHYSICIAN: Cherylann Banas, MD  CHIEF COMPLAINT:   Chief Complaint  Patient presents with  . Fall    HISTORY OF PRESENT ILLNESS:  Philip Gordon  is a 68 y.o. male who presents with chief complaint as above.  Patient presents to the ED with a complaint of multiple falls at home.  He has a history of multiple sclerosis.  He states that he started taking gabapentin, and the first day on taking it he fell.  The next day he took medication and he fell again.  He states that he had not been falling prior to this.  He does use a walker to walk.  He has some mild AKI here in the ED today as well.  Hospitalist were called for admission and further evaluation  PAST MEDICAL HISTORY:   Past Medical History:  Diagnosis Date  . Depression   . Hypercholesterolemia   . Hypertension   . Multiple sclerosis (HCC)    Sees Dr Ala Bent [facility])  . Prostate cancer ([place])    Followed by Dr [name] and Dr [name]     PAST SURGICAL HISTORY:   Past Surgical History:  Procedure Laterality Date  . KNEE SURGERY       SOCIAL HISTORY:   Social History   Tobacco Use  . Smoking status: Never Smoker  . Smokeless tobacco: Current User    Types: Chew  . Tobacco comment: Is going to try and cut down on the amount.   Substance Use Topics  . Alcohol use: No    Alcohol/week: 2.0 standard drinks    Types: 2 Cans of beer per week     FAMILY HISTORY:   Family History  Problem Relation Age of Onset  . Hypertension Mother   . Stroke Father   . Diabetes Maternal Grandmother   . Diabetes Maternal Grandfather   . Leukemia Sister        died - age 5     DRUG ALLERGIES:  No Known Allergies  MEDICATIONS AT HOME:   Prior to Admission medications   Medication  Sig Start Date End Date Taking? Authorizing Provider  amLODipine (NORVASC) 5 MG tablet Take 1 tablet (5 mg total) by mouth daily. 10/14/18  Yes Einar Pheasant, MD  diphenhydrAMINE (BENADRYL) 25 mg capsule Take 25 mg by mouth at bedtime as needed.    Yes [provider]  donepezil (ARICEPT) 10 MG tablet Take 10 mg by mouth at bedtime.  04/10/15  Yes [provider]  ezetimibe (ZETIA) 10 MG tablet Take 1 tablet (10 mg total) by mouth daily. 10/14/18  Yes Einar Pheasant, MD  finasteride (PROSCAR) 5 MG tablet Take 1 tablet (5 mg total) by mouth daily. 10/14/18  Yes Einar Pheasant, MD  fluticasone (FLONASE) 50 MCG/ACT nasal spray Place 2 sprays into both nostrils daily. 03/14/17  Yes Einar Pheasant, MD  gabapentin (NEURONTIN) 100 MG capsule Take 100 mg by mouth 3 (three) times daily. 01/15/19  Yes [provider]  hydrochlorothiazide (MICROZIDE) 12.5 MG capsule TAKE 1 CAPSULE BY MOUTH DAILY. 10/14/18  Yes Einar Pheasant, MD  mirtazapine (REMERON) 15 MG tablet TAKE 1/2 TABLET BY MOUTH AT BEDTIME 02/07/15  Yes Choksi, Janak, MD  POTASSIUM PO Take 99 mg by mouth daily.  Yes [provider]  sertraline (ZOLOFT) 25 MG tablet Take 1 tablet (25 mg total) by mouth daily. 10/14/18  Yes Einar Pheasant, MD  tiZANidine (ZANAFLEX) 4 MG tablet TAKE 1 TABLET BY MOUTH NIGHTLY 12/10/14  Yes Choksi, Delorise Shiner, MD  vitamin B-12 (CYANOCOBALAMIN) 1000 MCG tablet Take 1 tablet (1,000 mcg total) by mouth daily. 10/25/15  Yes Einar Pheasant, MD    REVIEW OF SYSTEMS:  Review of Systems  Constitutional: Negative for chills, fever, malaise/fatigue and weight loss.  HENT: Negative for ear pain, hearing loss and tinnitus.   Eyes: Negative for blurred vision, double vision, pain and redness.  Respiratory: Negative for cough, hemoptysis and shortness of breath.   Cardiovascular: Negative for chest pain, palpitations, orthopnea and leg swelling.  Gastrointestinal: Negative for abdominal pain,  constipation, diarrhea, nausea and vomiting.  Genitourinary: Negative for dysuria, frequency and hematuria.  Musculoskeletal: Positive for falls. Negative for back pain, joint pain and neck pain.  Skin:       No acne, rash, or lesions  Neurological: Negative for dizziness, tremors, focal weakness and weakness.  Endo/Heme/Allergies: Negative for polydipsia. Does not bruise/bleed easily.  Psychiatric/Behavioral: Negative for depression. The patient is not nervous/anxious and does not have insomnia.      VITAL SIGNS:   Vitals:   01/21/19 2100 01/21/19 2101 01/21/19 2200 01/21/19 2300  BP: (!) 152/72 (!) 152/72 136/68 135/68  Pulse: (!) 41 91 84 (!) 40  Resp:  17 20   Temp:  98.5 F (36.9 C)    TempSrc:  Oral    SpO2: 96% 100% 99% 100%   Wt Readings from Last 3 Encounters:  12/16/18 65.8 kg  03/31/18 65.8 kg  01/10/18 65 kg    PHYSICAL EXAMINATION:  Physical Exam  Vitals reviewed. Constitutional: He is oriented to person, place, and time. He appears well-developed and well-nourished. No distress.  HENT:  Head: Normocephalic and atraumatic.  Mouth/Throat: Oropharynx is clear and moist.  Eyes: Pupils are equal, round, and reactive to light. Conjunctivae and EOM are normal. No scleral icterus.  Neck: Normal range of motion. Neck supple. No JVD present. No thyromegaly present.  Cardiovascular: Normal rate, regular rhythm and intact distal pulses. Exam reveals no gallop and no friction rub.  No murmur heard. Respiratory: Effort normal and breath sounds normal. No respiratory distress. He has no wheezes. He has no rales.  GI: Soft. Bowel sounds are normal. He exhibits no distension. There is no abdominal tenderness.  Musculoskeletal: Normal range of motion.        General: No edema.     Comments: No arthritis, no gout  Lymphadenopathy:    He has no cervical adenopathy.  Neurological: He is alert and oriented to person, place, and time. No cranial nerve deficit.  No dysarthria, no  aphasia, no clear focal neurological deficit  Skin: Skin is warm and dry. No rash noted. No erythema.  Psychiatric: He has a normal mood and affect. His behavior is normal. Judgment and thought content normal.    LABORATORY PANEL:   CBC Recent Labs  Lab 01/21/19 2104  WBC 10.9*  HGB 10.4*  HCT 30.6*  PLT 343   ------------------------------------------------------------------------------------------------------------------  Chemistries  Recent Labs  Lab 01/21/19 2104  NA 136  K 4.7  CL 100  CO2 25  GLUCOSE 120*  BUN 21  CREATININE 1.45*  CALCIUM 9.3  AST 28  ALT 14  ALKPHOS 108  BILITOT 2.9*   ------------------------------------------------------------------------------------------------------------------  Cardiac Enzymes No results for input(s): TROPONINI in  the last 168 hours. ------------------------------------------------------------------------------------------------------------------  RADIOLOGY:  No results found.  EKG:   Orders placed or performed during the hospital encounter of 01/21/19  . EKG 12-Lead  . EKG 12-Lead    IMPRESSION AND PLAN:  Principal Problem:   Weakness -unclear if this is medication reaction to gabapentin, though the temporal correlation to symptoms and taking the new medicine certainly make it seem so.  This could also be an MS flare.  We will get a neurology consult, hold gabapentin for now Active Problems:   Multiple sclerosis ([place]) -continue home meds, neurology consult as above   AKI (acute kidney injury) ([place]) -gentle IV fluids, monitor for improvement, avoid nephrotoxins   Hypertension -home dose antihypertensives   Hypercholesteremia -home dose antilipid  Chart review performed and case discussed with ED provider. Labs, imaging and/or ECG reviewed by provider and discussed with patient/family. Management plans discussed with the patient and/or family.  COVID-19 status: Pending  DVT PROPHYLAXIS: SubQ lovenox   GI  PROPHYLAXIS:  None  ADMISSION STATUS: Observation  CODE STATUS: Full  TOTAL TIME TAKING CARE OF THIS PATIENT: 40 minutes.   This patient was evaluated in the context of the global COVID-19 pandemic, which necessitated consideration that the patient might be at risk for infection with the SARS-CoV-2 virus that causes COVID-19. Institutional protocols and algorithms that pertain to the evaluation of patients at risk for COVID-19 are in a state of rapid change based on information released by regulatory bodies including the CDC and federal and state organizations. These policies and algorithms were followed to the best of this provider's knowledge to date during the patient's care at this facility.  Ethlyn Daniels 01/21/2019, 11:58 PM  [facility]  Office  [phone]  CC: Primary care physician; Einar Pheasant, MD  Note:  This document was prepared using Dragon voice recognition software and may include unintentional dictation errors.

## 2019-01-21 NOTE — ED Provider Notes (Signed)
[facility] Emergency Department Provider Note ____________________________________________   First MD Initiated Contact with Patient 01/21/19 2057     (approximate)  I have reviewed the triage vital signs and the nursing notes.   HISTORY  Chief Complaint Fall    HPI Philip Gordon is a 68 y.o. male with PMH as noted below including a history of multiple sclerosis who presents with generalized weakness and multiple falls over the last day.  The patient states that he has fallen at least 4 times in the last day.  He lives alone and normally has 3 hours of help during the day.  He walks with a walker, but is able to complete all of his ADLs.  Over the last day he has been unable to do so.  At some point he sat on the floor for at least several hours.  The patient states that he may have hit the back of his head during one of the falls, but has no headache.  He did not have LOC.  He denies any back, hip, or extremity pain.  He states that the increased weakness and fall started after he was started on gabapentin.   Past Medical History:  Diagnosis Date  . Depression   . Hypercholesterolemia   . Hypertension   . Multiple sclerosis (HCC)    Sees Dr Ala Bent [facility])  . Prostate cancer [facility])    Followed by Dr [name] and Dr [name]    Patient Active Problem List   Diagnosis Date Noted  . 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 Bilateral foot-drop 02/04/2015  . Sinus infection 11/15/2014  . Right mandibular swelling 11/15/2014  . Brain lesion 01/17/2014  . Obstruction of urinary tract 10/15/2012  . Bladder outlet obstruction 10/15/2012  . Disorder of bladder function 01/16/2012  . Incomplete bladder emptying 01/16/2012  . History of prostate cancer 01/16/2012  . DS (disseminated sclerosis) ([place]) 01/16/2012  . Malignant neoplasm of prostate ([place]) 01/16/2012  . Hypertension 12/28/2011  . Hypercholesteremia  12/28/2011  . Multiple sclerosis ([place]) 12/28/2011  . B12 deficiency 12/28/2011  . Acquired ankle/foot deformity 12/07/2010    Past Surgical History:  Procedure Laterality Date  . KNEE SURGERY      Prior to Admission medications   Medication Sig Start Date End Date Taking? Authorizing Provider  amLODipine (NORVASC) 5 MG tablet Take 1 tablet (5 mg total) by mouth daily. 10/14/18   Einar Pheasant, MD  diphenhydrAMINE (BENADRYL) 25 mg capsule Take by mouth.    [provider]  donepezil (ARICEPT) 10 MG tablet Take 1qd 04/10/15   [provider]  ezetimibe (ZETIA) 10 MG tablet Take 1 tablet (10 mg total) by mouth daily. 10/14/18   Einar Pheasant, MD  finasteride (PROSCAR) 5 MG tablet Take 1 tablet (5 mg total) by mouth daily. 10/14/18   Einar Pheasant, MD  fluticasone (FLONASE) 50 MCG/ACT nasal spray Place 2 sprays into both nostrils daily. 03/14/17   Einar Pheasant, MD  hydrochlorothiazide (MICROZIDE) 12.5 MG capsule TAKE 1 CAPSULE BY MOUTH DAILY. 10/14/18   Einar Pheasant, MD  mirtazapine (REMERON) 15 MG tablet TAKE 1/2 TABLET BY MOUTH AT BEDTIME Patient taking differently: Takes one tablet at bedtime 02/07/15   Forest Gleason, MD  POTASSIUM PO Take 99 mg by mouth daily.    [provider]  sertraline (ZOLOFT) 25 MG tablet Take 1 tablet (25 mg total) by mouth daily. 10/14/18   Einar Pheasant, MD  tiZANidine (  ZANAFLEX) 4 MG tablet TAKE 1 TABLET BY MOUTH NIGHTLY 12/10/14   Forest Gleason, MD  vitamin B-12 (CYANOCOBALAMIN) 1000 MCG tablet Take 1 tablet (1,000 mcg total) by mouth daily. 10/25/15   Einar Pheasant, MD    Allergies Patient has no known allergies.  Family History  Problem Relation Age of Onset  . Hypertension Mother   . Stroke Father   . Diabetes Maternal Grandmother   . Diabetes Maternal Grandfather   . Leukemia Sister        died - age 54    Social History Social History   Tobacco Use  . Smoking status: Never Smoker  . Smokeless tobacco:  Current User    Types: Chew  . Tobacco comment: Is going to try and cut down on the amount.   Substance Use Topics  . Alcohol use: No    Alcohol/week: 2.0 standard drinks    Types: 2 Cans of beer per week  . Drug use: No    Review of Systems  Constitutional: No fever.  Positive for weakness. Eyes: No visual changes. ENT: No sore throat. Cardiovascular: Denies chest pain. Respiratory: Denies shortness of breath. Gastrointestinal: No vomiting. Genitourinary: Negative for dysuria.  Musculoskeletal: Negative for back pain. Skin: Negative for rash. Neurological: Negative for headache.   ____________________________________________   PHYSICAL EXAM:  VITAL SIGNS: ED Triage Vitals [01/21/19 2101]  Enc Vitals Group     BP (!) 152/72     Pulse Rate 91     Resp 17     Temp 98.5 F (36.9 C)     Temp Source Oral     SpO2 100 %     Weight      Height      Head Circumference      Peak Flow      Pain Score      Pain Loc      Pain Edu?      Excl. in [place]?     Constitutional: Alert and oriented. Well appearing and in no acute distress. Eyes: Conjunctivae are normal.  Head: Atraumatic. Nose: No congestion/rhinnorhea. Mouth/Throat: Mucous membranes are moist.   Neck: Normal range of motion.  Cardiovascular: Normal rate, regular rhythm.   Good peripheral circulation. Respiratory: Normal respiratory effort.  No retractions. Gastrointestinal: Soft and nontender. No distention.  Genitourinary: No CVA tenderness. Musculoskeletal: No lower extremity edema.  Extremities warm and well perfused.  No midline spinal tenderness.  Full range of motion of bilateral hips. Neurologic:  Motor intact in all extremities. Skin:  Skin is warm and dry. No rash noted. Psychiatric: Mood and affect are normal. Speech and behavior are normal.  ____________________________________________   LABS (all labs ordered are listed, but only abnormal results are displayed)  Labs Reviewed  COMPREHENSIVE  METABOLIC PANEL - Abnormal; Notable for the following components:      Result Value   Glucose, Bld 120 (*)    Creatinine, Ser 1.45 (*)    Total Bilirubin 2.9 (*)    GFR calc non Af Amer 48 (*)    GFR calc Af Amer 56 (*)    All other components within normal limits  CBC WITH DIFFERENTIAL/PLATELET - Abnormal; Notable for the following components:   WBC 10.9 (*)    RBC 3.14 (*)    Hemoglobin 10.4 (*)    HCT 30.6 (*)    Neutro Abs 8.7 (*)    All other components within normal limits  CK - Abnormal; Notable for the following components:  Total CK 403 (*)    All other components within normal limits  SARS CORONAVIRUS 2 (TAT 6-24 HRS)  LACTIC ACID, PLASMA  URINALYSIS, COMPLETE (UACMP) WITH MICROSCOPIC  LACTIC ACID, PLASMA  TROPONIN I (HIGH SENSITIVITY)  TROPONIN I (HIGH SENSITIVITY)   ____________________________________________  EKG  ED ECG REPORT I, Arta Silence, the attending physician, personally viewed and interpreted this ECG.  Date: 01/21/2019 EKG Time: 2101 Rate: 93 Rhythm: Ventricular bigeminy QRS Axis: normal Intervals: RBBB, LAFB ST/T Wave abnormalities: normal Narrative Interpretation: no evidence of acute ischemia  ____________________________________________  RADIOLOGY    ____________________________________________   PROCEDURES  Procedure(s) performed: No  Procedures  Critical Care performed: No ____________________________________________   INITIAL IMPRESSION / ASSESSMENT AND PLAN / ED COURSE  Pertinent labs & imaging results that were available during my care of the patient were reviewed by me and considered in my medical decision making (see chart for details).  68 year old male with a history of MS and other PMH as noted above presents with increased generalized weakness and multiple falls over the last day, since he was started on gabapentin.  The patient states that he spent some time on the ground.  He states he hit his head once  but did not have LOC and has no headache.  He denies any other acute injuries.  On exam, he is relatively comfortable appearing.  The vital signs are normal except for hypertension.  The physical exam is as described above.  Motor is intact in all extremities and the neurologic exam is otherwise nonfocal.  He has no midline spinal tenderness and no evidence of acute trauma.  EKG shows bigeminy.  Overall I suspect most likely medication side effect given the time course versus MS flare.  I have a lower suspicion for infection.  Differential also includes dehydration or other metabolic abnormality.  We will obtain lab work-up, give fluids, and reassess.  There is no indication for imaging at this time.  I anticipate the patient will likely need admission.  ----------------------------------------- 10:50 PM on 01/21/2019 -----------------------------------------  Lab work-up reveals elevated CK.  The patient has been given IV fluids.  Given his relatively acute weakness and multiple falls, we will admit him for further observation, IV hydration, and neurology evaluation.  I signed the patient out to the hospitalist Dr. Jannifer Franklin.  ________________________  Philip Gordon was evaluated in Emergency Department on 01/21/2019 for the symptoms described in the history of present illness. He was evaluated in the context of the global COVID-19 pandemic, which necessitated consideration that the patient might be at risk for infection with the SARS-CoV-2 virus that causes COVID-19. Institutional protocols and algorithms that pertain to the evaluation of patients at risk for COVID-19 are in a state of rapid change based on information released by regulatory bodies including the CDC and federal and state organizations. These policies and algorithms were followed during the patient's care in the ED.  ____________________________________________   FINAL CLINICAL IMPRESSION(S) / ED DIAGNOSES  Final diagnoses:   Weakness  Non-traumatic rhabdomyolysis  Multiple sclerosis ([place])      NEW MEDICATIONS STARTED DURING THIS VISIT:  New Prescriptions   No medications on file     Note:  This document was prepared using Dragon voice recognition software and may include unintentional dictation errors.    Arta Silence, MD 01/21/19 2250

## 2019-01-22 ENCOUNTER — Observation Stay: Payer: Medicare Other

## 2019-01-22 ENCOUNTER — Other Ambulatory Visit: Payer: Self-pay

## 2019-01-22 DIAGNOSIS — R296 Repeated falls: Secondary | ICD-10-CM | POA: Diagnosis not present

## 2019-01-22 DIAGNOSIS — R531 Weakness: Secondary | ICD-10-CM | POA: Diagnosis not present

## 2019-01-22 DIAGNOSIS — I10 Essential (primary) hypertension: Secondary | ICD-10-CM | POA: Diagnosis not present

## 2019-01-22 DIAGNOSIS — N179 Acute kidney failure, unspecified: Secondary | ICD-10-CM | POA: Diagnosis not present

## 2019-01-22 DIAGNOSIS — G35 Multiple sclerosis: Secondary | ICD-10-CM | POA: Diagnosis not present

## 2019-01-22 DIAGNOSIS — M4802 Spinal stenosis, cervical region: Secondary | ICD-10-CM | POA: Diagnosis not present

## 2019-01-22 LAB — CBC
HCT: 29 % — ABNORMAL LOW (ref 39.0–52.0)
Hemoglobin: 9.9 g/dL — ABNORMAL LOW (ref 13.0–17.0)
MCH: 31 pg (ref 26.0–34.0)
MCHC: 34.1 g/dL (ref 30.0–36.0)
MCV: 90.9 fL (ref 80.0–100.0)
Platelets: 327 10*3/uL (ref 150–400)
RBC: 3.19 MIL/uL — ABNORMAL LOW (ref 4.22–5.81)
RDW: 13.2 % (ref 11.5–15.5)
WBC: 6.8 10*3/uL (ref 4.0–10.5)
nRBC: 0.4 % — ABNORMAL HIGH (ref 0.0–0.2)

## 2019-01-22 LAB — BASIC METABOLIC PANEL
Anion gap: 11 (ref 5–15)
BUN: 17 mg/dL (ref 8–23)
CO2: 24 mmol/L (ref 22–32)
Calcium: 8.6 mg/dL — ABNORMAL LOW (ref 8.9–10.3)
Chloride: 102 mmol/L (ref 98–111)
Creatinine, Ser: 1.25 mg/dL — ABNORMAL HIGH (ref 0.61–1.24)
GFR calc Af Amer: >60 mL/min (ref >60)
GFR calc non Af Amer: 58 mL/min — ABNORMAL LOW (ref >60)
Glucose, Bld: 91 mg/dL (ref 70–99)
Potassium: 3.1 mmol/L — ABNORMAL LOW (ref 3.5–5.1)
Sodium: 137 mmol/L (ref 135–145)

## 2019-01-22 LAB — HIV ANTIBODY (ROUTINE TESTING W REFLEX): HIV Screen 4th Generation wRfx: NONREACTIVE

## 2019-01-22 LAB — TROPONIN I (HIGH SENSITIVITY): Troponin I (High Sensitivity): 6 ng/L (ref <18)

## 2019-01-22 LAB — SARS CORONAVIRUS 2 (TAT 6-24 HRS): SARS Coronavirus 2: NEGATIVE

## 2019-01-22 MED ORDER — EZETIMIBE 10 MG PO TABS
10.0000 mg | ORAL_TABLET | Freq: Every day | ORAL | Status: DC
  Administered 2019-01-22 – 2019-01-27 (×6): 10 mg via ORAL
  Filled 2019-01-22 (×6): qty 1

## 2019-01-22 MED ORDER — MIRTAZAPINE 15 MG PO TABS
7.5000 mg | ORAL_TABLET | Freq: Every day | ORAL | Status: DC
  Administered 2019-01-22 – 2019-01-27 (×6): 7.5 mg via ORAL
  Filled 2019-01-22 (×6): qty 1

## 2019-01-22 MED ORDER — ACETAMINOPHEN 650 MG RE SUPP: 650.0000 mg | Freq: Four times a day (QID) | RECTAL | Status: DC | PRN

## 2019-01-22 MED ORDER — SERTRALINE HCL 50 MG PO TABS
25.0000 mg | ORAL_TABLET | Freq: Every day | ORAL | Status: DC
  Administered 2019-01-22 – 2019-01-27 (×6): 25 mg via ORAL
  Filled 2019-01-22 (×6): qty 1

## 2019-01-22 MED ORDER — AMLODIPINE BESYLATE 5 MG PO TABS
5.0000 mg | ORAL_TABLET | Freq: Every day | ORAL | Status: DC
  Administered 2019-01-23 – 2019-01-27 (×4): 5 mg via ORAL
  Filled 2019-01-22 (×5): qty 1

## 2019-01-22 MED ORDER — DONEPEZIL HCL 5 MG PO TABS
10.0000 mg | ORAL_TABLET | Freq: Every day | ORAL | Status: DC
  Administered 2019-01-22 – 2019-01-27 (×6): 10 mg via ORAL
  Filled 2019-01-22 (×6): qty 2

## 2019-01-22 MED ORDER — VITAMIN B-12 1000 MCG PO TABS
1000.0000 ug | ORAL_TABLET | Freq: Every day | ORAL | Status: DC
  Administered 2019-01-22 – 2019-01-27 (×6): 1000 ug via ORAL
  Filled 2019-01-22 (×6): qty 1

## 2019-01-22 MED ORDER — POTASSIUM CHLORIDE CRYS ER 20 MEQ PO TBCR
40.0000 meq | EXTENDED_RELEASE_TABLET | ORAL | Status: AC
  Administered 2019-01-22 (×2): 40 meq via ORAL
  Filled 2019-01-22 (×2): qty 2

## 2019-01-22 MED ORDER — FINASTERIDE 5 MG PO TABS
5.0000 mg | ORAL_TABLET | Freq: Every day | ORAL | Status: DC
  Administered 2019-01-22 – 2019-01-27 (×6): 5 mg via ORAL
  Filled 2019-01-22 (×6): qty 1

## 2019-01-22 MED ORDER — GADOBUTROL 1 MMOL/ML IV SOLN
6.0000 mL | Freq: Once | INTRAVENOUS | Status: AC | PRN
  Administered 2019-01-22: 6 mL via INTRAVENOUS

## 2019-01-22 MED ORDER — ENOXAPARIN SODIUM 40 MG/0.4ML ~~LOC~~ SOLN
40.0000 mg | SUBCUTANEOUS | Status: DC
  Administered 2019-01-22 – 2019-01-27 (×6): 40 mg via SUBCUTANEOUS
  Filled 2019-01-22 (×6): qty 0.4

## 2019-01-22 MED ORDER — SODIUM CHLORIDE 0.9 % IV SOLN
INTRAVENOUS | Status: DC
  Administered 2019-01-22: 03:00:00 via INTRAVENOUS

## 2019-01-22 MED ORDER — ONDANSETRON HCL 4 MG PO TABS: 4.0000 mg | ORAL_TABLET | Freq: Four times a day (QID) | ORAL | Status: DC | PRN

## 2019-01-22 MED ORDER — ONDANSETRON HCL 4 MG/2ML IJ SOLN: 4.0000 mg | Freq: Four times a day (QID) | INTRAMUSCULAR | Status: DC | PRN

## 2019-01-22 MED ORDER — ACETAMINOPHEN 325 MG PO TABS: 650.0000 mg | ORAL_TABLET | Freq: Four times a day (QID) | ORAL | Status: DC | PRN

## 2019-01-22 NOTE — Progress Notes (Signed)
[facility] [place] NAME: Khye Devilliers    MR#:  [id number]  DATE OF BIRTH:  1951-01-07  SUBJECTIVE:  CHIEF COMPLAINT:   Chief Complaint  Patient presents with  . Fall   Feels weak. Has been declining with ambulation for past few months Afebrile No swallow issues No vision changes Lives alone  REVIEW OF SYSTEMS:    Review of Systems  Constitutional: Positive for malaise/fatigue. Negative for chills and fever.  HENT: Negative for sore throat.   Eyes: Negative for blurred vision, double vision and pain.  Respiratory: Negative for cough, hemoptysis, shortness of breath and wheezing.   Cardiovascular: Negative for chest pain, palpitations, orthopnea and leg swelling.  Gastrointestinal: Negative for abdominal pain, constipation, diarrhea, heartburn, nausea and vomiting.  Genitourinary: Negative for dysuria and hematuria.  Musculoskeletal: Positive for falls. Negative for back pain and joint pain.  Skin: Negative for rash.  Neurological: Negative for sensory change, speech change, focal weakness and headaches.  Endo/Heme/Allergies: Does not bruise/bleed easily.  Psychiatric/Behavioral: Negative for depression. The patient is not nervous/anxious.     DRUG ALLERGIES:  No Known Allergies  VITALS:  Blood pressure 129/66, pulse (!) 50, temperature 97.7 F (36.5 C), temperature source Oral, resp. rate 15, height 5\' 7"  (1.702 m), weight 65.8 kg, SpO2 100 %.  PHYSICAL EXAMINATION:   Physical Exam  GENERAL:  68 y.o.-year-old patient lying in the bed with no acute distress.  EYES: Pupils equal, round, reactive to light and accommodation. No scleral icterus. Extraocular muscles intact.  HEENT: Head atraumatic, normocephalic. Oropharynx and nasopharynx clear.  NECK:  Supple, no jugular venous distention. No thyroid enlargement, no tenderness.  LUNGS: Normal breath sounds bilaterally, no wheezing, rales, rhonchi. No use of accessory muscles  of respiration.  CARDIOVASCULAR: S1, S2 normal. No murmurs, rubs, or gallops.  ABDOMEN: Soft, nontender, nondistended. Bowel sounds present. No organomegaly or mass.  EXTREMITIES: No cyanosis, clubbing or edema b/l.    NEUROLOGIC: Cranial nerves II through XII are intact. No focal Motor or sensory deficits b/l.   PSYCHIATRIC: The patient is alert and oriented x 3.  SKIN: No obvious rash, lesion, or ulcer.   LABORATORY PANEL:   CBC Recent Labs  Lab 01/22/19 0818  WBC 6.8  HGB 9.9*  HCT 29.0*  PLT 327   ------------------------------------------------------------------------------------------------------------------ Chemistries  Recent Labs  Lab 01/21/19 2104 01/22/19 0704  NA 136 137  K 4.7 3.1*  CL 100 102  CO2 25 24  GLUCOSE 120* 91  BUN 21 17  CREATININE 1.45* 1.25*  CALCIUM 9.3 8.6*  AST 28  --   ALT 14  --   ALKPHOS 108  --   BILITOT 2.9*  --    ------------------------------------------------------------------------------------------------------------------  Cardiac Enzymes No results for input(s): TROPONINI in the last 168 hours. ------------------------------------------------------------------------------------------------------------------  RADIOLOGY:  Mr Jeri Cos Wo Contrast  Result Date: 01/22/2019 CLINICAL DATA:  68 year old male with chronic multiple sclerosis. Multiple recent falls. Requires a walker. EXAM: MRI HEAD WITHOUT AND WITH CONTRAST TECHNIQUE: Multiplanar, multiecho pulse sequences of the brain and surrounding structures were obtained without and with intravenous contrast. CONTRAST:  70mL GADAVIST GADOBUTROL 1 MMOL/ML IV SOLN COMPARISON:  Brain MRI 03/03/2015 and earlier. Cervical spine MRI reported separately today. FINDINGS: Brain: Cerebral volume loss since 2016 appears to be generalized. Ventricular prominence has not significantly changed, and the temporal horns remain diminutive. No restricted diffusion to suggest acute infarction. No midline  shift, mass effect, evidence of mass lesion, ventriculomegaly, extra-axial  collection or acute intracranial hemorrhage. Cervicomedullary junction and pituitary are within normal limits. No abnormal enhancement identified. No dural thickening. Encephalomalacia in the medial left parietal lobe (series 16, image 37). No associated enhancement. Elsewhere patchy white matter T2 and FLAIR hyperintensity has only mildly progressed since 2016. no other cortical encephalomalacia identified. No chronic cerebral blood products. Moderate T2 heterogeneity in the deep gray nuclei is slightly worse on the right and not significantly changed. Brainstem and cerebellum also appear stable since 2016. Vascular: Major intracranial vascular flow voids are stable since 2016. The major dural venous sinuses are enhancing and appear to be patent. Skull and upper cervical spine: Cervical spine today is reported separately. Bone marrow signal remains normal. Sinuses/Orbits: Orbits appear negative. Chronic sphenoid sinusitis worse on the left. Other paranasal sinuses remain well pneumatized. Other: Mastoids remain clear. Visible internal auditory structures appear normal. There is a chronic right superior convexity scalp soft tissue lesion with FLAIR hyperintensity and enhancement measuring 14-15 millimeters (series 16, image 36) which has not significantly changed since 2016. Other scalp soft tissues appear negative. IMPRESSION: 1. No acute intracranial abnormality. 2. Encephalomalacia at the site of the medial left parietal lobe enhancing lesion which must have been tumefactive demyelination. Otherwise mild progression since 2016 of white matter signal changes and generalized cerebral volume loss. 3. Chronic sphenoid sinusitis. Chronic benign right parietal scalp lesion. Electronically Signed   By: Genevie Ann M.D.   On: 01/22/2019 12:56   Mr Cervical Spine W Wo Contrast  Result Date: 01/22/2019 CLINICAL DATA:  68 year old male with chronic  multiple sclerosis. Multiple recent falls. Requires a walker. EXAM: MRI CERVICAL SPINE WITHOUT AND WITH CONTRAST TECHNIQUE: Multiplanar and multiecho pulse sequences of the cervical spine, to include the craniocervical junction and cervicothoracic junction, were obtained without and with intravenous contrast. CONTRAST:  66mL GADAVIST GADOBUTROL 1 MMOL/ML IV SOLN COMPARISON:  Brain MRI today reported separately. No prior cervical spine imaging. FINDINGS: Alignment: Mild straightening of cervical lordosis. Mild degenerative appearing retrolisthesis at C3-C4 and C4-C5. Vertebrae: There is degenerative appearing right foraminal and far lateral endplate marrow edema at C6-C7 (series 11, image 7). No other marrow edema or evidence of acute osseous abnormality. Background bone marrow signal is within normal limits. Cord: No definite cervical spinal cord signal abnormality, despite degenerative spinal stenosis with cord mass effect from C3-C4 through C5-C6 (see below). Spinal cord volume appears relatively normal. No abnormal intradural enhancement. No dural thickening. Posterior Fossa, vertebral arteries, paraspinal tissues: No signal abnormality in the cervical spine ligamentous complex. Cervicomedullary junction is within normal limits. Preserved major vascular flow voids in the neck. Negative neck soft tissues. Negative visible lung apices. Disc levels: C2-C3: Up to moderate left side facet degeneration and hypertrophy. Moderate to severe left C3 foraminal stenosis. C3-C4: Mild retrolisthesis with disc space loss and circumferential disc osteophyte complex. Mild to moderate posterior element hypertrophy. Mild spinal stenosis and spinal cord mass effect. Severe bilateral C4 foraminal stenosis. C4-C5: Mild retrolisthesis and disc space loss with circumferential disc osteophyte complex. Broad-based posterior component. Mild-to-moderate spinal stenosis and spinal cord mass effect (series 12, image 12). Severe bilateral C5  foraminal stenosis. C5-C6: Disc space loss with circumferential disc osteophyte complex. Broad-based posterior component with mild posterior element hypertrophy. Mild spinal stenosis. Mild if any spinal cord mass effect. Severe bilateral C6 foraminal stenosis. C6-C7: Disc space loss with circumferential disc bulge. Mostly foraminal and far lateral endplate spurring. Moderate ligament flavum and right greater than left facet hypertrophy. No significant spinal stenosis. Moderate  left and severe right C7 foraminal stenosis. C7-T1: Moderate facet hypertrophy. Mild to moderate bilateral C8 foraminal stenosis. No upper thoracic spinal stenosis. IMPRESSION: 1. A no cervical spinal cord demyelination identified, and no definite cord signal abnormality despite multilevel degenerative spinal stenosis (see #2). 2. Widespread cervical spine degeneration. Degenerative spinal stenosis C3-C4 through C5-C6 with maximal cord mass effect at C4-C5 (mild-to-moderate). Severe bilateral C4 through C6 foraminal stenosis. 3. Moderate to severe degenerative neural foraminal stenosis also at the left C3 and right greater than left C7 nerve levels. Electronically Signed   By: Genevie Ann M.D.   On: 01/22/2019 13:04     ASSESSMENT AND PLAN:   * Gait abnormality due to long standing MS  MRI with nothing acute Discussed with Neurology. No Iv steroids needed PT- SNF recommended. Patient lives alone. Not safe to discharge home as he has already had multiple falls Continue home meds Stop neurontin  * CKD 3- stable No AKI Stop IVF   * Hypertension -home dose antihypertensives    * Hypercholesteremia -home dose antilipid  DVT prophylaxis Lovenox  All the records are reviewed and case discussed with Care Management/Social Worker Management plans discussed with the patient, family and they are in agreement.  CODE STATUS: FULL CODE  TOTAL TIME TAKING CARE OF THIS PATIENT: 35 minutes.   POSSIBLE D/C IN 1-2 DAYS, DEPENDING ON  CLINICAL CONDITION.  Leia Alf Lamont Glasscock M.D on 01/22/2019 at 7:52 PM  Between 7am to 6pm - Pager - [phone]  After 6pm go to [email or web address] - password [email or web address] [facility]  Office  [phone]  CC: Primary care physician; Einar Pheasant, MD  Note: This dictation was prepared with Dragon dictation along with smaller phrase technology. Any transcriptional errors that result from this process are unintentional.

## 2019-01-22 NOTE — Evaluation (Addendum)
Physical Therapy Evaluation Patient Details Name: Philip Gordon MRN: [id number] DOB: [birth year] Today's Date: 01/22/2019   History of Present Illness  Pt is a 68 y.o. male who presents to the ED on 10/28 with a complaint of multiple falls at home.  Pt with PMH of depression, HTN, MS, hypercholesterolemia, prostate cancer. Pt attributes recent falls to having started takin gabapentin, reporting no falls prior to this. Was also determined to have mild AKI in the ED.  Clinical Impression  Pt is a pleasant 68 year old male who was admitted for the above diagnosis. Pt's PLOF was reportedly independent with self-care ADLs, transfers and ambulation with 4ww for household distances (and with use of bilateral AFOs), and utilized WC for community distances; he also had an aide come 3 hours/day to assist with cleaning/cooking. Pt demonstrates poor safety awareness, at times impulsively grabbing for objects once standing for increased stability, requiring frequent verbal and tactile cuing for safe sequencing and hand placement. Pt performs bed mobility with CGA requiring extra time and close supervision for safety, and transfers with mod A for liftoff and balance. Pt performs sit>stand transfer with mod A requiring cuing for sequencing (to scoot to edge of bed, utilize momentum for decr difficulty) and safe hand placement, frequently attempting to pull up from RW; demonstrates flexed posture once standing with limited improvement with cuing. Once standing, pt instructed to trial standing marching demonstrating poor standing balance and impaired coordination, at times stepping on R foot with L.  Pt performs standing pivot transfer to bedside commode requiring mod A for balance and frequent verbal/tactile cues for safe sequencing and hand placement with pt minimally receptive. Once sitting, re-assessed patient's vital signs with HR measured at 40 bpm, increasing to 70 bpm with brief sitting rest break. Deferred gait  today secondary to safety concerns relating to pt's HR response to activity (most recent K+ 3.1), poor safety awareness, and impaired coordination. Pt will benefit from continued skilled PT intervention to address deficits in strength, balance, and coordination for improved functional mobility.      Follow Up Recommendations SNF    Equipment Recommendations  Rolling walker with 5" wheels;3in1 (PT)    Recommendations for Other Services       Precautions / Restrictions Precautions Precautions: Fall Restrictions Weight Bearing Restrictions: No      Mobility  Bed Mobility Overal bed mobility: Needs Assistance Bed Mobility: Supine to Sit     Supine to sit: Supervision     General bed mobility comments: Requiring extra time, close supervision and CGA for safe completion. Cuing provided to perform log rolling technique.  Transfers Overall transfer level: Needs assistance Equipment used: Rolling walker (2 wheeled) Transfers: Sit to/from [employer] Sit to Stand: Mod assist(Cuing for setup and sequencing; frequent VCs required for safe hand placement to avoid pulling up from RW. Mod A for assist with liftoff and to achieve upright.)   Squat pivot transfers: Mod assist     General transfer comment: Mod A for balance and achieving upright. Frequent verbal and tactile cuing for safe sequencing and hand placement, with pt at times impulsively grabbing for objects for balance/assistance.  Ambulation/Gait             General Gait Details: Deferred for safety  Stairs            Wheelchair Mobility    Modified Rankin (Stroke Patients Only)       Balance Overall balance assessment: Needs assistance Sitting-balance support: Feet supported;No upper  extremity supported Sitting balance-Leahy Scale: Fair     Standing balance support: Bilateral upper extremity supported Standing balance-Leahy Scale: Poor Standing balance comment: Heavy reliance on UEs;  difficulty shifting weight in standing to take a step with support of RW                             Pertinent Vitals/Pain Pain Assessment: No/denies pain    Home Living Family/patient expects to be discharged to:: Private residence Living Arrangements: Alone Available Help at Discharge: Family;Available PRN/intermittently Type of Home: House Home Access: Ramped entrance     Home Layout: One level Home Equipment: [place] - 4 wheels;Wheelchair - manual;Grab bars - tub/shower      Prior Function Level of Independence: Needs assistance   Gait / Transfers Assistance Needed: Pt reports independent with 4ww for household distances.  ADL's / Homemaking Assistance Needed: Pt has aide assist 3 hours/day for cooking/cleaning, but states independent with self-care ADLs.        Hand Dominance        Extremity/Trunk Assessment   Upper Extremity Assessment Upper Extremity Assessment: Generalized weakness    Lower Extremity Assessment Lower Extremity Assessment: Generalized weakness;LLE deficits/detail;RLE deficits/detail RLE Sensation: WNL LLE Deficits / Details: LLE slightly weaker than RLE LLE Sensation: WNL LLE Coordination: decreased gross motor       Communication      Cognition Arousal/Alertness: Awake/alert Behavior During Therapy: WFL for tasks assessed/performed Overall Cognitive Status: Within Functional Limits for tasks assessed                                        General Comments      Exercises     Assessment/Plan    PT Assessment Patient needs continued PT services  PT Problem List Decreased mobility;Decreased strength;Decreased safety awareness;Decreased coordination;Decreased activity tolerance;Decreased balance;Decreased knowledge of use of DME       PT Treatment Interventions DME instruction;Therapeutic exercise;Gait training;Balance training;Neuromuscular re-education;Functional mobility training;Therapeutic  activities;Patient/family education    PT Goals (Current goals can be found in the Care Plan section)  Acute Rehab PT Goals Patient Stated Goal: to go home PT Goal Formulation: With patient Time For Goal Achievement: 02/05/19 Potential to Achieve Goals: Fair    Frequency Min 2X/week   Barriers to discharge        Co-evaluation               AM-PAC PT "6 Clicks" Mobility  Outcome Measure Help needed turning from your back to your side while in a flat bed without using bedrails?: A Little Help needed moving from lying on your back to sitting on the side of a flat bed without using bedrails?: None Help needed moving to and from a bed to a chair (including a wheelchair)?: A Lot Help needed standing up from a chair using your arms (e.g., wheelchair or bedside chair)?: A Lot Help needed to walk in hospital room?: A Lot Help needed climbing 3-5 steps with a railing? : A Lot 6 Click Score: 15    End of Session Equipment Utilized During Treatment: Gait belt;Other (comment)(bilateral AFOs) Activity Tolerance: Patient tolerated treatment well Patient left: with nursing/sitter in room(using bedside commode with CNA) Nurse Communication: Mobility status PT Visit Diagnosis: Unsteadiness on feet (R26.81);Other abnormalities of gait and mobility (R26.89);Repeated falls (R29.6);Muscle weakness (generalized) (M62.81)    Time: [id number]  PT Time Calculation (min) (ACUTE ONLY): 47 min   Charges:   PT Evaluation $PT Eval Moderate Complexity: 1 Mod PT Treatments $Therapeutic Activity: 8-22 mins        Petra Kuba, PT, DPT 01/22/19, 5:40 PM

## 2019-01-23 DIAGNOSIS — I10 Essential (primary) hypertension: Secondary | ICD-10-CM | POA: Diagnosis not present

## 2019-01-23 DIAGNOSIS — E538 Deficiency of other specified B group vitamins: Secondary | ICD-10-CM | POA: Diagnosis present

## 2019-01-23 DIAGNOSIS — G35 Multiple sclerosis: Principal | ICD-10-CM

## 2019-01-23 DIAGNOSIS — R531 Weakness: Secondary | ICD-10-CM

## 2019-01-23 DIAGNOSIS — Z8546 Personal history of malignant neoplasm of prostate: Secondary | ICD-10-CM | POA: Diagnosis not present

## 2019-01-23 DIAGNOSIS — M6282 Rhabdomyolysis: Secondary | ICD-10-CM | POA: Diagnosis present

## 2019-01-23 DIAGNOSIS — N179 Acute kidney failure, unspecified: Secondary | ICD-10-CM | POA: Diagnosis present

## 2019-01-23 DIAGNOSIS — Z7401 Bed confinement status: Secondary | ICD-10-CM | POA: Diagnosis not present

## 2019-01-23 DIAGNOSIS — N139 Obstructive and reflux uropathy, unspecified: Secondary | ICD-10-CM | POA: Diagnosis not present

## 2019-01-23 DIAGNOSIS — N319 Neuromuscular dysfunction of bladder, unspecified: Secondary | ICD-10-CM | POA: Diagnosis not present

## 2019-01-23 DIAGNOSIS — Z20828 Contact with and (suspected) exposure to other viral communicable diseases: Secondary | ICD-10-CM | POA: Diagnosis present

## 2019-01-23 DIAGNOSIS — F17220 Nicotine dependence, chewing tobacco, uncomplicated: Secondary | ICD-10-CM | POA: Diagnosis present

## 2019-01-23 DIAGNOSIS — F329 Major depressive disorder, single episode, unspecified: Secondary | ICD-10-CM | POA: Diagnosis present

## 2019-01-23 DIAGNOSIS — M21372 Foot drop, left foot: Secondary | ICD-10-CM | POA: Diagnosis not present

## 2019-01-23 DIAGNOSIS — I129 Hypertensive chronic kidney disease with stage 1 through stage 4 chronic kidney disease, or unspecified chronic kidney disease: Secondary | ICD-10-CM | POA: Diagnosis present

## 2019-01-23 DIAGNOSIS — M2550 Pain in unspecified joint: Secondary | ICD-10-CM | POA: Diagnosis not present

## 2019-01-23 DIAGNOSIS — M21371 Foot drop, right foot: Secondary | ICD-10-CM | POA: Diagnosis not present

## 2019-01-23 DIAGNOSIS — R296 Repeated falls: Secondary | ICD-10-CM | POA: Diagnosis present

## 2019-01-23 DIAGNOSIS — I959 Hypotension, unspecified: Secondary | ICD-10-CM | POA: Diagnosis not present

## 2019-01-23 DIAGNOSIS — E7800 Pure hypercholesterolemia, unspecified: Secondary | ICD-10-CM | POA: Diagnosis present

## 2019-01-23 DIAGNOSIS — E785 Hyperlipidemia, unspecified: Secondary | ICD-10-CM | POA: Diagnosis present

## 2019-01-23 DIAGNOSIS — Z79899 Other long term (current) drug therapy: Secondary | ICD-10-CM | POA: Diagnosis not present

## 2019-01-23 DIAGNOSIS — W19XXXA Unspecified fall, initial encounter: Secondary | ICD-10-CM | POA: Diagnosis present

## 2019-01-23 DIAGNOSIS — R001 Bradycardia, unspecified: Secondary | ICD-10-CM | POA: Diagnosis not present

## 2019-01-23 DIAGNOSIS — N183 Chronic kidney disease, stage 3 unspecified: Secondary | ICD-10-CM | POA: Diagnosis present

## 2019-01-23 DIAGNOSIS — Z8249 Family history of ischemic heart disease and other diseases of the circulatory system: Secondary | ICD-10-CM | POA: Diagnosis not present

## 2019-01-23 NOTE — Plan of Care (Signed)

## 2019-01-23 NOTE — TOC Initial Note (Signed)
Transition of Care [facility]) - Initial/Assessment Note    Patient Details  Name: Philip Gordon MRN: [id number] Date of Birth: May 04, 1950  [facility] CM/SW Contact:    Philip Gordon, Philip Gordon Phone Number: [phone]  01/23/2019, 5:58 PM  Clinical Narrative: PT is recommending SNF. Clinical Education officer, museum (CSW) met with patient alone at bedside to discuss D/C plan. Patient was alert and oriented X4 and was laying in the bed. CSW introduced self and explained role of CSW department. Patient reported that he lives alone in an apartment in [place]. Patient reported that he has a private duty caregiver 3 hours per, 6 days per week. Patient reported that his cousin Philip Gordon is his HPOA and provides support. CSW explained SNF process and that medicare requires a 3 night qualifying inpatient hospital stay in order to pay for SNF. Patient was admitted to inpatient 10/30. Patient is agreeable to SNF search in [facility]. FL2 complete and faxed out.   CSW provided patient SNF bed offers and CMS SNF list. Patient chose [facility]. CSW contacted patient's HPOA Philip Gordon at patient's request and made him aware of above. Philip Gordon is in agreement with [facility]. CSW explained long term placement options with Philip Gordon including private pay and medicaid. Per [facility] admissions coordinator at [facility] they can accept patient on Monday 11/2. CSW will continue to follow and assist as needed.               Expected Discharge Plan: Skilled Nursing Facility Barriers to Discharge: Continued Medical Work up   Patient Goals and CMS Choice Patient states their goals for this hospitalization and ongoing recovery are:: To go to rehab. CMS Medicare.gov Compare Post Acute Care list provided to:: Patient Choice offered to / list presented to : Patient  Expected Discharge Plan and Services Expected Discharge Plan: [place] In-house Referral: Clinical Social Work   Post Acute Care  Choice: [place] Living arrangements for the past 2 months: Apartment                 DME Arranged: N/A         HH Arranged: NA          Prior Living Arrangements/Services Living arrangements for the past 2 months: Apartment Lives with:: Self Patient language and need for interpreter reviewed:: No Do you feel safe going back to the place where you live?: Yes      Need for Family Participation in Patient Care: Yes (Comment) Care giver support system in place?: Yes (comment)   Criminal Activity/Legal Involvement Pertinent to Current Situation/Hospitalization: No - Comment as needed  Activities of Daily Living Home Assistive Devices/Equipment: Wheelchair, Environmental consultant (specify type) ADL Screening (condition at time of admission) Patient's cognitive ability adequate to safely complete daily activities?: No Is the patient deaf or have difficulty hearing?: No Does the patient have difficulty seeing, even when wearing glasses/contacts?: No Does the patient have difficulty concentrating, remembering, or making decisions?: No Patient able to express need for assistance with ADLs?: Yes Does the patient have difficulty dressing or bathing?: No Independently performs ADLs?: Yes (appropriate for developmental age) Communication: Independent Dressing (OT): Independent Is this a change from baseline?: Pre-admission baseline Grooming: Independent Feeding: Independent Bathing: Independent Toileting: Independent In/Out Bed: Independent Walks in Home: Independent Does the patient have difficulty walking or climbing stairs?: Yes Weakness of Legs: Both Weakness of Arms/Hands: Both  Permission Sought/Granted Permission sought to share information with : Chartered certified accountant  granted to share information with : Yes, Verbal Permission Granted              Emotional Assessment Appearance:: Appears stated age Attitude/Demeanor/Rapport: Engaged Affect  (typically observed): Pleasant, Calm Orientation: : Oriented to Self, Oriented to Place, Oriented to  Time, Oriented to Situation Alcohol / Substance Use: Not Applicable Psych Involvement: No (comment)  Admission diagnosis:  Multiple sclerosis (HCC) [G35] Weakness [R53.1] Non-traumatic rhabdomyolysis [M62.82] Patient Active Problem List   Diagnosis Date Noted  . Fall 01/23/2019  . Weakness 01/21/2019  . AKI (acute kidney injury) ([place]) 01/21/2019  . 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 Bilateral foot-drop 02/04/2015  . Sinus infection 11/15/2014  . Right mandibular swelling 11/15/2014  . Brain lesion 01/17/2014  . Obstruction of urinary tract 10/15/2012  . Bladder outlet obstruction 10/15/2012  . Disorder of bladder function 01/16/2012  . Incomplete bladder emptying 01/16/2012  . History of prostate cancer 01/16/2012  . DS (disseminated sclerosis) ([place]) 01/16/2012  . Malignant neoplasm of prostate ([place]) 01/16/2012  . Hypertension 12/28/2011  . Hypercholesteremia 12/28/2011  . Multiple sclerosis ([place]) 12/28/2011  . B12 deficiency 12/28/2011  . Acquired ankle/foot deformity 12/07/2010   PCP:  Philip Pheasant, MD Pharmacy:   [id number] [place] [id number]: [phone]: [address] [address]Phone: [phone]: [phone]    Social Determinants of Health (SDOH) Interventions    Readmission Risk Interventions No flowsheet data found.

## 2019-01-23 NOTE — Consult Note (Signed)
Reason for Consult:Falls Referring Physician: [name]  CC: Falls  HPI: Philip Gordon is an 68 y.o. male with a history of secondary progressive MS followed by [name] who presents with multiple falls.  Patient reports that he has been getting progressively weaker.  Was seen by neurology recently with recommendations made for repeat imaging and Neurontin started.  Patient has been falling since the start of the Neurontin.  Had not had a fall for quite some time prior to starting the Neurontin.    Past Medical History:  Diagnosis Date  . Depression   . Hypercholesterolemia   . Hypertension   . Multiple sclerosis (HCC)    Sees [name])  . Prostate cancer [facility])    Followed by Dr [name] and Dr [name]    Past Surgical History:  Procedure Laterality Date  . KNEE SURGERY      Family History  Problem Relation Age of Onset  . Hypertension Mother   . Stroke Father   . Diabetes Maternal Grandmother   . Diabetes Maternal Grandfather   . Leukemia Sister        died - age 45    Social History:  reports that he has never smoked. His smokeless tobacco use includes chew. He reports that he does not drink alcohol or use drugs.  No Known Allergies  Medications:  I have reviewed the patient's current medications. Prior to Admission:  Medications Prior to Admission  Medication Sig Dispense Refill Last Dose  . amLODipine (NORVASC) 5 MG tablet Take 1 tablet (5 mg total) by mouth daily. 90 tablet 1 unknown at unknown  . diphenhydrAMINE (BENADRYL) 25 mg capsule Take 25 mg by mouth at bedtime as needed.    prn at prn  . donepezil (ARICEPT) 10 MG tablet Take 10 mg by mouth at bedtime.   5 unknown at unknown  . ezetimibe (ZETIA) 10 MG tablet Take 1 tablet (10 mg total) by mouth daily. 90 tablet 1 unknown at unknown  . finasteride (PROSCAR) 5 MG tablet Take 1 tablet (5 mg total) by mouth daily. 90 tablet 1 unknown at unknown  . fluticasone (FLONASE) 50 MCG/ACT nasal spray Place 2  sprays into both nostrils daily. 16 g 6 unknown at unknown  . gabapentin (NEURONTIN) 100 MG capsule Take 100 mg by mouth 3 (three) times daily.   unknown at unknown  . hydrochlorothiazide (MICROZIDE) 12.5 MG capsule TAKE 1 CAPSULE BY MOUTH DAILY. 90 capsule 1 unknown at unknown  . mirtazapine (REMERON) 15 MG tablet TAKE 1/2 TABLET BY MOUTH AT BEDTIME 15 tablet 4 unknown at unknown  . POTASSIUM PO Take 99 mg by mouth daily.   unknown at unknown  . sertraline (ZOLOFT) 25 MG tablet Take 1 tablet (25 mg total) by mouth daily. 90 tablet 1 unknonw at unknown  . tiZANidine (ZANAFLEX) 4 MG tablet TAKE 1 TABLET BY MOUTH NIGHTLY 30 tablet 4 unknown at unknown  . vitamin B-12 (CYANOCOBALAMIN) 1000 MCG tablet Take 1 tablet (1,000 mcg total) by mouth daily. 90 tablet 3 unknown at unknown   Scheduled: . amLODipine  5 mg Oral Daily  . donepezil  10 mg Oral QHS  . enoxaparin (LOVENOX) injection  40 mg Subcutaneous Q24H  . ezetimibe  10 mg Oral QHS  . finasteride  5 mg Oral Daily  . mirtazapine  7.5 mg Oral QHS  . sertraline  25 mg Oral Daily  . vitamin B-12  1,000 mcg Oral Daily    ROS: History  obtained from the patient  General ROS: negative for - chills, fatigue, fever, night sweats, weight gain or weight loss Psychological ROS: negative for - behavioral disorder, hallucinations, memory difficulties, mood swings or suicidal ideation Ophthalmic ROS: negative for - blurry vision, double vision, eye pain or loss of vision ENT ROS: negative for - epistaxis, nasal discharge, oral lesions, sore throat, tinnitus or vertigo Allergy and Immunology ROS: negative for - hives or itchy/watery eyes Hematological and Lymphatic ROS: negative for - bleeding problems, bruising or swollen lymph nodes Endocrine ROS: negative for - galactorrhea, hair pattern changes, polydipsia/polyuria or temperature intolerance Respiratory ROS: negative for - cough, hemoptysis, shortness of breath or wheezing Cardiovascular ROS:  negative for - chest pain, dyspnea on exertion, edema or irregular heartbeat Gastrointestinal ROS: negative for - abdominal pain, diarrhea, hematemesis, nausea/vomiting or stool incontinence Genito-Urinary ROS: negative for - dysuria, hematuria, incontinence or urinary frequency/urgency Musculoskeletal ROS: negative for - joint swelling or muscular weakness Neurological ROS: as noted in HPI Dermatological ROS: negative for rash and skin lesion changes  Physical Examination: Blood pressure (!) 130/57, pulse 62, temperature 98.3 F (36.8 C), temperature source Oral, resp. rate 18, height 5\' 7"  (1.702 m), weight 65.8 kg, SpO2 100 %.  HEENT-  Normocephalic, no lesions, without obvious abnormality.  Normal external eye and conjunctiva.  Normal TM's bilaterally.  Normal auditory canals and external ears. Normal external nose, mucus membranes and septum.  Normal pharynx. Cardiovascular- S1, S2 normal, pulses palpable throughout   Lungs- chest clear, no wheezing, rales, normal symmetric air entry Abdomen- soft, non-tender; bowel sounds normal; no masses,  no organomegaly Extremities- no edema Lymph-no adenopathy palpable Musculoskeletal-no joint tenderness, deformity or swelling Skin-warm and dry, no hyperpigmentation, vitiligo, or suspicious lesions  Neurological Examination   Mental Status: Alert, oriented but some difficulty with memory .  Speech fluent but dysarthric.  Able to follow 3 step commands without difficulty. Cranial Nerves: II: Discs flat bilaterally; Visual fields grossly normal, pupils equal, round, reactive to light and accommodation III,IV, VI: ptosis not present, extra-ocular motions intact bilaterally V,VII: smile symmetric, facial light touch sensation normal bilaterally VIII: hearing normal bilaterally IX,X: gag reflex present XI: bilateral shoulder shrug XII: midline tongue extension Motor: Patient able to lift both upper extremities against gravity with no focal  weakness noted.  Patient able to lift both lower extremities but weakly at 4-/5 strength.   Sensory: Pinprick and light touch intact throughout, bilaterally Deep Tendon Reflexes: Symmetric throughout Plantars: Right: mute   Left: mute Cerebellar: Dysmetria, left greater than right Gait: not tested due to safety concerns   Laboratory Studies:   Basic Metabolic Panel: Recent Labs  Lab 01/21/19 2104 01/22/19 0704  NA 136 137  K 4.7 3.1*  CL 100 102  CO2 25 24  GLUCOSE 120* 91  BUN 21 17  CREATININE 1.45* 1.25*  CALCIUM 9.3 8.6*    Liver Function Tests: Recent Labs  Lab 01/21/19 2104  AST 28  ALT 14  ALKPHOS 108  BILITOT 2.9*  PROT 7.8  ALBUMIN 4.7   No results for input(s): LIPASE, AMYLASE in the last 168 hours. No results for input(s): AMMONIA in the last 168 hours.  CBC: Recent Labs  Lab 01/21/19 2104 01/22/19 0818  WBC 10.9* 6.8  NEUTROABS 8.7*  --   HGB 10.4* 9.9*  HCT 30.6* 29.0*  MCV 97.5 90.9  PLT 343 327    Cardiac Enzymes: Recent Labs  Lab 01/21/19 2104  CKTOTAL 403*    BNP: Invalid input(s):  POCBNP  CBG: No results for input(s): GLUCAP in the last 168 hours.  Microbiology: Results for orders placed or performed during the hospital encounter of 01/21/19  SARS CORONAVIRUS 2 (TAT 6-24 HRS) Nasopharyngeal Nasopharyngeal Swab     Status: None   Collection Time: 01/21/19 11:35 PM   Specimen: Nasopharyngeal Swab  Result Value Ref Range Status   SARS Coronavirus 2 NEGATIVE NEGATIVE Final    Comment: (NOTE) SARS-CoV-2 target nucleic acids are NOT DETECTED. The SARS-CoV-2 RNA is generally detectable in upper and lower respiratory specimens during the acute phase of infection. Negative results do not preclude SARS-CoV-2 infection, do not rule out co-infections with other pathogens, and should not be used as the sole basis for treatment or other patient management decisions. Negative results must be combined with clinical  observations, patient history, and epidemiological information. The expected result is Negative. Fact Sheet for Patients: SugarRoll.be Fact Sheet for Healthcare Providers: https://www.woods-mathews.com/ This test is not yet approved or cleared by the [place] FDA and  has been authorized for detection and/or diagnosis of SARS-CoV-2 by FDA under an Emergency Use Authorization (EUA). This EUA will remain  in effect (meaning this test can be used) for the duration of the COVID-19 declaration under Section 56 4(b)(1) of the Act, 21 U.S.C. section 360bbb-3(b)(1), unless the authorization is terminated or revoked sooner. Performed at [facility], [place] [address], [place], [place] [address]     Coagulation Studies: No results for input(s): LABPROT, INR in the last 72 hours.  Urinalysis:  Recent Labs  Lab 01/21/19 2151  COLORURINE YELLOW*  LABSPEC 1.015  PHURINE 6.0  GLUCOSEU NEGATIVE  HGBUR NEGATIVE  BILIRUBINUR NEGATIVE  KETONESUR NEGATIVE  PROTEINUR NEGATIVE  NITRITE NEGATIVE  LEUKOCYTESUR NEGATIVE    Lipid Panel:     Component Value Date/Time   CHOL 145 03/31/2018 1429   TRIG 130.0 03/31/2018 1429   HDL 44.70 03/31/2018 1429   CHOLHDL 3 03/31/2018 1429   VLDL 26.0 03/31/2018 1429   LDLCALC 74 03/31/2018 1429    HgbA1C: No results found for: HGBA1C  Urine Drug Screen:      Component Value Date/Time   LABOPIA NEGATIVE 10/09/2011 1508   COCAINSCRNUR NEGATIVE 10/09/2011 1508   LABBENZ POSITIVE 10/09/2011 1508   AMPHETMU NEGATIVE 10/09/2011 1508   THCU NEGATIVE 10/09/2011 1508   LABBARB NEGATIVE 10/09/2011 1508    Alcohol Level: No results for input(s): ETH in the last 168 hours.  Other results: EKG: sinus rhythm at 93 bpm.  Imaging: Mr Jeri Cos Wo Contrast  Result Date: 01/22/2019 CLINICAL DATA:  68 year old male with chronic multiple sclerosis. Multiple recent falls. Requires a walker. EXAM: MRI HEAD  WITHOUT AND WITH CONTRAST TECHNIQUE: Multiplanar, multiecho pulse sequences of the brain and surrounding structures were obtained without and with intravenous contrast. CONTRAST:  2mL GADAVIST GADOBUTROL 1 MMOL/ML IV SOLN COMPARISON:  Brain MRI 03/03/2015 and earlier. Cervical spine MRI reported separately today. FINDINGS: Brain: Cerebral volume loss since 2016 appears to be generalized. Ventricular prominence has not significantly changed, and the temporal horns remain diminutive. No restricted diffusion to suggest acute infarction. No midline shift, mass effect, evidence of mass lesion, ventriculomegaly, extra-axial collection or acute intracranial hemorrhage. Cervicomedullary junction and pituitary are within normal limits. No abnormal enhancement identified. No dural thickening. Encephalomalacia in the medial left parietal lobe (series 16, image 37). No associated enhancement. Elsewhere patchy white matter T2 and FLAIR hyperintensity has only mildly progressed since 2016. no other cortical encephalomalacia identified. No chronic cerebral  blood products. Moderate T2 heterogeneity in the deep gray nuclei is slightly worse on the right and not significantly changed. Brainstem and cerebellum also appear stable since 2016. Vascular: Major intracranial vascular flow voids are stable since 2016. The major dural venous sinuses are enhancing and appear to be patent. Skull and upper cervical spine: Cervical spine today is reported separately. Bone marrow signal remains normal. Sinuses/Orbits: Orbits appear negative. Chronic sphenoid sinusitis worse on the left. Other paranasal sinuses remain well pneumatized. Other: Mastoids remain clear. Visible internal auditory structures appear normal. There is a chronic right superior convexity scalp soft tissue lesion with FLAIR hyperintensity and enhancement measuring 14-15 millimeters (series 16, image 36) which has not significantly changed since 2016. Other scalp soft tissues  appear negative. IMPRESSION: 1. No acute intracranial abnormality. 2. Encephalomalacia at the site of the medial left parietal lobe enhancing lesion which must have been tumefactive demyelination. Otherwise mild progression since 2016 of white matter signal changes and generalized cerebral volume loss. 3. Chronic sphenoid sinusitis. Chronic benign right parietal scalp lesion. Electronically Signed   By: Genevie Ann M.D.   On: 01/22/2019 12:56   Mr Cervical Spine W Wo Contrast  Result Date: 01/22/2019 CLINICAL DATA:  68 year old male with chronic multiple sclerosis. Multiple recent falls. Requires a walker. EXAM: MRI CERVICAL SPINE WITHOUT AND WITH CONTRAST TECHNIQUE: Multiplanar and multiecho pulse sequences of the cervical spine, to include the craniocervical junction and cervicothoracic junction, were obtained without and with intravenous contrast. CONTRAST:  13mL GADAVIST GADOBUTROL 1 MMOL/ML IV SOLN COMPARISON:  Brain MRI today reported separately. No prior cervical spine imaging. FINDINGS: Alignment: Mild straightening of cervical lordosis. Mild degenerative appearing retrolisthesis at C3-C4 and C4-C5. Vertebrae: There is degenerative appearing right foraminal and far lateral endplate marrow edema at C6-C7 (series 11, image 7). No other marrow edema or evidence of acute osseous abnormality. Background bone marrow signal is within normal limits. Cord: No definite cervical spinal cord signal abnormality, despite degenerative spinal stenosis with cord mass effect from C3-C4 through C5-C6 (see below). Spinal cord volume appears relatively normal. No abnormal intradural enhancement. No dural thickening. Posterior Fossa, vertebral arteries, paraspinal tissues: No signal abnormality in the cervical spine ligamentous complex. Cervicomedullary junction is within normal limits. Preserved major vascular flow voids in the neck. Negative neck soft tissues. Negative visible lung apices. Disc levels: C2-C3: Up to moderate  left side facet degeneration and hypertrophy. Moderate to severe left C3 foraminal stenosis. C3-C4: Mild retrolisthesis with disc space loss and circumferential disc osteophyte complex. Mild to moderate posterior element hypertrophy. Mild spinal stenosis and spinal cord mass effect. Severe bilateral C4 foraminal stenosis. C4-C5: Mild retrolisthesis and disc space loss with circumferential disc osteophyte complex. Broad-based posterior component. Mild-to-moderate spinal stenosis and spinal cord mass effect (series 12, image 12). Severe bilateral C5 foraminal stenosis. C5-C6: Disc space loss with circumferential disc osteophyte complex. Broad-based posterior component with mild posterior element hypertrophy. Mild spinal stenosis. Mild if any spinal cord mass effect. Severe bilateral C6 foraminal stenosis. C6-C7: Disc space loss with circumferential disc bulge. Mostly foraminal and far lateral endplate spurring. Moderate ligament flavum and right greater than left facet hypertrophy. No significant spinal stenosis. Moderate left and severe right C7 foraminal stenosis. C7-T1: Moderate facet hypertrophy. Mild to moderate bilateral C8 foraminal stenosis. No upper thoracic spinal stenosis. IMPRESSION: 1. A no cervical spinal cord demyelination identified, and no definite cord signal abnormality despite multilevel degenerative spinal stenosis (see #2). 2. Widespread cervical spine degeneration. Degenerative spinal stenosis C3-C4 through C5-C6  with maximal cord mass effect at C4-C5 (mild-to-moderate). Severe bilateral C4 through C6 foraminal stenosis. 3. Moderate to severe degenerative neural foraminal stenosis also at the left C3 and right greater than left C7 nerve levels. Electronically Signed   By: Genevie Ann M.D.   On: 01/22/2019 13:04     Assessment/Plan: 68 year old male with secondary progressive multiple sclerosis presenting with frequent falls that he reports have started since starting Neurontin.  Patient has  been progressively weakening though.  Is followed by [name] on an outpatient basis.   Recommendations: 1. MRI of the brain and cervical spine with and without contrast 2. D/C Neurontin 3. PT/OT consults    Alexis Goodell, MD Neurology [phone] 01/23/2019, 9:31 AM   Addendum: MRI of the brain and cervical spine with and without contrast reviewed and shows no contrast enhancing lesions.  Case discussed with Dr. Trena Platt office.  It was decided that steroids would not be started at this time.  Would continue patient off Neurontin.    Alexis Goodell, MD Neurology [phone]

## 2019-01-23 NOTE — NC FL2 (Signed)
[place] LEVEL OF CARE SCREENING TOOL     IDENTIFICATION  Patient Name: Philip Gordon Birthdate: 1950/05/26 Sex: male Admission Date (Current Location): 01/21/2019  [place] and [place] Number:  Engineering geologist and Address:  [facility], [address], [place], [place] [address]      Provider Number: [id number]  Attending Physician Name and Address:  Hillary Bow, MD  Relative Name and Phone Number:       Current Level of Care: Hospital Recommended Level of Care: [place] Prior Approval Number:    Date Approved/Denied:   PASRR Number: [id number]  Discharge Plan: SNF    Current Diagnoses: Patient Active Problem List   Diagnosis Date Noted  . Fall 01/23/2019  . Weakness 01/21/2019  . AKI (acute kidney injury) ([place]) 01/21/2019  . 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 Bilateral foot-drop 02/04/2015  . Sinus infection 11/15/2014  . Right mandibular swelling 11/15/2014  . Brain lesion 01/17/2014  . Obstruction of urinary tract 10/15/2012  . Bladder outlet obstruction 10/15/2012  . Disorder of bladder function 01/16/2012  . Incomplete bladder emptying 01/16/2012  . History of prostate cancer 01/16/2012  . DS (disseminated sclerosis) ([place]) 01/16/2012  . Malignant neoplasm of prostate ([name]) 01/16/2012  . Hypertension 12/28/2011  . Hypercholesteremia 12/28/2011  . Multiple sclerosis ([place]) 12/28/2011  . B12 deficiency 12/28/2011  . Acquired ankle/foot deformity 12/07/2010    Orientation RESPIRATION BLADDER Height & Weight     Self, Time, Situation, Place  Normal Incontinent Weight: 145 lb 1 oz (65.8 kg) Height:  5\' 7"  (170.2 cm)  BEHAVIORAL SYMPTOMS/MOOD NEUROLOGICAL BOWEL NUTRITION STATUS      Continent Diet(Diet: Heart Healthy)  AMBULATORY STATUS COMMUNICATION OF NEEDS Skin   Extensive Assist Verbally Normal                       Personal Care  Assistance Level of Assistance  Bathing, Feeding, Dressing Bathing Assistance: Limited assistance Feeding assistance: Independent Dressing Assistance: Limited assistance     Functional Limitations Info  Sight, Hearing, Speech Sight Info: Adequate Hearing Info: Adequate Speech Info: Adequate    SPECIAL CARE FACTORS FREQUENCY  PT (By licensed PT), OT (By licensed OT)     PT Frequency: 5 OT Frequency: 5            Contractures      Additional Factors Info  Code Status, Allergies Code Status Info: Full Code. Allergies Info: No Known Allergies.           Current Medications (01/23/2019):  This is the current hospital active medication list Current Facility-Administered Medications  Medication Dose Route Frequency Provider Last Rate Last Dose  . acetaminophen (TYLENOL) tablet 650 mg  650 mg Oral Q6H PRN Lance Coon, MD       Or  . acetaminophen (TYLENOL) suppository 650 mg  650 mg Rectal Q6H PRN Lance Coon, MD      . amLODipine (NORVASC) tablet 5 mg  5 mg Oral Daily Lance Coon, MD      . donepezil (ARICEPT) tablet 10 mg  10 mg Oral Corwin Levins, MD   10 mg at 01/22/19 2105  . enoxaparin (LOVENOX) injection 40 mg  40 mg Subcutaneous Q24H Lance Coon, MD   40 mg at 01/22/19 0908  . ezetimibe (ZETIA) tablet 10 mg  10 mg Oral QHS Hillary Bow, MD   10 mg at 01/22/19 2105  . finasteride (  PROSCAR) tablet 5 mg  5 mg Oral Daily Lance Coon, MD   5 mg at 01/22/19 [id number]  . mirtazapine (REMERON) tablet 7.5 mg  7.5 mg Oral Corwin Levins, MD   7.5 mg at 01/22/19 2105  . ondansetron (ZOFRAN) tablet 4 mg  4 mg Oral Q6H PRN Lance Coon, MD       Or  . ondansetron [facility]) injection 4 mg  4 mg Intravenous Q6H PRN Lance Coon, MD      . sertraline (ZOLOFT) tablet 25 mg  25 mg Oral Daily Lance Coon, MD   25 mg at 01/22/19 [id number]  . vitamin B-12 (CYANOCOBALAMIN) tablet 1,000 mcg  1,000 mcg Oral Daily Lance Coon, MD   1,000 mcg at 01/22/19 0907     Discharge  Medications: Please see discharge summary for a list of discharge medications.  Relevant Imaging Results:  Relevant Lab Results:   Additional Information SSN: [id number]  Lou Irigoyen, Veronia Beets, LCSW

## 2019-01-23 NOTE — Progress Notes (Signed)
[facility] NAME: Philip Gordon    MR#:  [id number]  DATE OF BIRTH:  11-06-1950  SUBJECTIVE:  CHIEF COMPLAINT:   Chief Complaint  Patient presents with  . Fall   Still weak. Declining over sometime No swallow issues No vision changes Lives alone  REVIEW OF SYSTEMS:    Review of Systems  Constitutional: Positive for malaise/fatigue. Negative for chills and fever.  HENT: Negative for sore throat.   Eyes: Negative for blurred vision, double vision and pain.  Respiratory: Negative for cough, hemoptysis, shortness of breath and wheezing.   Cardiovascular: Negative for chest pain, palpitations, orthopnea and leg swelling.  Gastrointestinal: Negative for abdominal pain, constipation, diarrhea, heartburn, nausea and vomiting.  Genitourinary: Negative for dysuria and hematuria.  Musculoskeletal: Positive for falls. Negative for back pain and joint pain.  Skin: Negative for rash.  Neurological: Negative for sensory change, speech change, focal weakness and headaches.  Endo/Heme/Allergies: Does not bruise/bleed easily.  Psychiatric/Behavioral: Negative for depression. The patient is not nervous/anxious.     DRUG ALLERGIES:  No Known Allergies  VITALS:  Blood pressure (!) 130/57, pulse 62, temperature 98.3 F (36.8 C), temperature source Oral, resp. rate 18, height 5\' 7"  (1.702 m), weight 65.8 kg, SpO2 100 %.  PHYSICAL EXAMINATION:   Physical Exam  GENERAL:  68 y.o.-year-old patient lying in the bed with no acute distress.  EYES: Pupils equal, round, reactive to light and accommodation. No scleral icterus. Extraocular muscles intact.  HEENT: Head atraumatic, normocephalic. Oropharynx and nasopharynx clear.  NECK:  Supple, no jugular venous distention. No thyroid enlargement, no tenderness.  LUNGS: Normal breath sounds bilaterally, no wheezing, rales, rhonchi. No use of accessory muscles of respiration.  CARDIOVASCULAR: S1, S2  normal. No murmurs, rubs, or gallops.  ABDOMEN: Soft, nontender, nondistended. Bowel sounds present. No organomegaly or mass.  EXTREMITIES: No cyanosis, clubbing or edema b/l.    NEUROLOGIC: Cranial nerves II through XII are intact. No focal Motor or sensory deficits b/l.   PSYCHIATRIC: The patient is alert and oriented x 3.  SKIN: No obvious rash, lesion, or ulcer.   LABORATORY PANEL:   CBC Recent Labs  Lab 01/22/19 0818  WBC 6.8  HGB 9.9*  HCT 29.0*  PLT 327   ------------------------------------------------------------------------------------------------------------------ Chemistries  Recent Labs  Lab 01/21/19 2104 01/22/19 0704  NA 136 137  K 4.7 3.1*  CL 100 102  CO2 25 24  GLUCOSE 120* 91  BUN 21 17  CREATININE 1.45* 1.25*  CALCIUM 9.3 8.6*  AST 28  --   ALT 14  --   ALKPHOS 108  --   BILITOT 2.9*  --    ------------------------------------------------------------------------------------------------------------------  Cardiac Enzymes No results for input(s): TROPONINI in the last 168 hours. ------------------------------------------------------------------------------------------------------------------  RADIOLOGY:  Mr Jeri Cos Wo Contrast  Result Date: 01/22/2019 CLINICAL DATA:  68 year old male with chronic multiple sclerosis. Multiple recent falls. Requires a walker. EXAM: MRI HEAD WITHOUT AND WITH CONTRAST TECHNIQUE: Multiplanar, multiecho pulse sequences of the brain and surrounding structures were obtained without and with intravenous contrast. CONTRAST:  109mL GADAVIST GADOBUTROL 1 MMOL/ML IV SOLN COMPARISON:  Brain MRI 03/03/2015 and earlier. Cervical spine MRI reported separately today. FINDINGS: Brain: Cerebral volume loss since 2016 appears to be generalized. Ventricular prominence has not significantly changed, and the temporal horns remain diminutive. No restricted diffusion to suggest acute infarction. No midline shift, mass effect, evidence of mass  lesion, ventriculomegaly, extra-axial collection or acute intracranial hemorrhage. Cervicomedullary junction  and pituitary are within normal limits. No abnormal enhancement identified. No dural thickening. Encephalomalacia in the medial left parietal lobe (series 16, image 37). No associated enhancement. Elsewhere patchy white matter T2 and FLAIR hyperintensity has only mildly progressed since 2016. no other cortical encephalomalacia identified. No chronic cerebral blood products. Moderate T2 heterogeneity in the deep gray nuclei is slightly worse on the right and not significantly changed. Brainstem and cerebellum also appear stable since 2016. Vascular: Major intracranial vascular flow voids are stable since 2016. The major dural venous sinuses are enhancing and appear to be patent. Skull and upper cervical spine: Cervical spine today is reported separately. Bone marrow signal remains normal. Sinuses/Orbits: Orbits appear negative. Chronic sphenoid sinusitis worse on the left. Other paranasal sinuses remain well pneumatized. Other: Mastoids remain clear. Visible internal auditory structures appear normal. There is a chronic right superior convexity scalp soft tissue lesion with FLAIR hyperintensity and enhancement measuring 14-15 millimeters (series 16, image 36) which has not significantly changed since 2016. Other scalp soft tissues appear negative. IMPRESSION: 1. No acute intracranial abnormality. 2. Encephalomalacia at the site of the medial left parietal lobe enhancing lesion which must have been tumefactive demyelination. Otherwise mild progression since 2016 of white matter signal changes and generalized cerebral volume loss. 3. Chronic sphenoid sinusitis. Chronic benign right parietal scalp lesion. Electronically Signed   By: Genevie Ann M.D.   On: 01/22/2019 12:56   Mr Cervical Spine W Wo Contrast  Result Date: 01/22/2019 CLINICAL DATA:  68 year old male with chronic multiple sclerosis. Multiple recent  falls. Requires a walker. EXAM: MRI CERVICAL SPINE WITHOUT AND WITH CONTRAST TECHNIQUE: Multiplanar and multiecho pulse sequences of the cervical spine, to include the craniocervical junction and cervicothoracic junction, were obtained without and with intravenous contrast. CONTRAST:  58mL GADAVIST GADOBUTROL 1 MMOL/ML IV SOLN COMPARISON:  Brain MRI today reported separately. No prior cervical spine imaging. FINDINGS: Alignment: Mild straightening of cervical lordosis. Mild degenerative appearing retrolisthesis at C3-C4 and C4-C5. Vertebrae: There is degenerative appearing right foraminal and far lateral endplate marrow edema at C6-C7 (series 11, image 7). No other marrow edema or evidence of acute osseous abnormality. Background bone marrow signal is within normal limits. Cord: No definite cervical spinal cord signal abnormality, despite degenerative spinal stenosis with cord mass effect from C3-C4 through C5-C6 (see below). Spinal cord volume appears relatively normal. No abnormal intradural enhancement. No dural thickening. Posterior Fossa, vertebral arteries, paraspinal tissues: No signal abnormality in the cervical spine ligamentous complex. Cervicomedullary junction is within normal limits. Preserved major vascular flow voids in the neck. Negative neck soft tissues. Negative visible lung apices. Disc levels: C2-C3: Up to moderate left side facet degeneration and hypertrophy. Moderate to severe left C3 foraminal stenosis. C3-C4: Mild retrolisthesis with disc space loss and circumferential disc osteophyte complex. Mild to moderate posterior element hypertrophy. Mild spinal stenosis and spinal cord mass effect. Severe bilateral C4 foraminal stenosis. C4-C5: Mild retrolisthesis and disc space loss with circumferential disc osteophyte complex. Broad-based posterior component. Mild-to-moderate spinal stenosis and spinal cord mass effect (series 12, image 12). Severe bilateral C5 foraminal stenosis. C5-C6: Disc space  loss with circumferential disc osteophyte complex. Broad-based posterior component with mild posterior element hypertrophy. Mild spinal stenosis. Mild if any spinal cord mass effect. Severe bilateral C6 foraminal stenosis. C6-C7: Disc space loss with circumferential disc bulge. Mostly foraminal and far lateral endplate spurring. Moderate ligament flavum and right greater than left facet hypertrophy. No significant spinal stenosis. Moderate left and severe right C7 foraminal stenosis.  C7-T1: Moderate facet hypertrophy. Mild to moderate bilateral C8 foraminal stenosis. No upper thoracic spinal stenosis. IMPRESSION: 1. A no cervical spinal cord demyelination identified, and no definite cord signal abnormality despite multilevel degenerative spinal stenosis (see #2). 2. Widespread cervical spine degeneration. Degenerative spinal stenosis C3-C4 through C5-C6 with maximal cord mass effect at C4-C5 (mild-to-moderate). Severe bilateral C4 through C6 foraminal stenosis. 3. Moderate to severe degenerative neural foraminal stenosis also at the left C3 and right greater than left C7 nerve levels. Electronically Signed   By: Genevie Ann M.D.   On: 01/22/2019 13:04     ASSESSMENT AND PLAN:   * Gait abnormality due to long standing MS  MRI with nothing acute Discussed with Neurology. No Iv steroids needed PT- SNF recommended. Patient lives alone. Not safe to discharge home as he has already had multiple falls Continue home meds Stop neurontin Appreciate neurology input Will change to inpatient as advised by [facility]  * CKD 3- stable No AKI Stop IVF   * Hypertension -home dose antihypertensives    * Hypercholesteremia -home dose antilipid  DVT prophylaxis Lovenox  D/C to SNF. Called his HCPO and left VM  All the records are reviewed and case discussed with Care Management/Social Worker Management plans discussed with the patient, family and they are in agreement.  CODE STATUS: FULL CODE  TOTAL TIME TAKING  CARE OF THIS PATIENT: 35 minutes.   POSSIBLE D/C IN 1-2 DAYS, DEPENDING ON CLINICAL CONDITION.  Leia Alf Bryant Lipps M.D on 01/23/2019 at 1:39 PM  Between 7am to 6pm - Pager - [phone]  After 6pm go to [email or web address] - password [email or web address] [facility]  Office  [phone]  CC: Primary care physician; Einar Pheasant, MD  Note: This dictation was prepared with Dragon dictation along with smaller phrase technology. Any transcriptional errors that result from this process are unintentional.

## 2019-01-24 MED ORDER — BISMUTH SUBSALICYLATE 262 MG/15ML PO SUSP
30.0000 mL | Freq: Once | ORAL | Status: AC
  Administered 2019-01-24: 30 mL via ORAL
  Filled 2019-01-24: qty 118

## 2019-01-24 MED ORDER — BISMUTH SUBSALICYLATE 262 MG PO CHEW
262.0000 mg | CHEWABLE_TABLET | Freq: Once | ORAL | Status: DC
  Filled 2019-01-24: qty 1

## 2019-01-24 NOTE — Plan of Care (Signed)
Patient with no s/s of distress noted.  Makes needs known. Call bell in reach.

## 2019-01-24 NOTE — Progress Notes (Signed)
Subjective: No new neurological complaints.    Objective: Current vital signs: BP (!) 129/56 (BP Location: Left Arm)   Pulse (!) 43   Temp 97.9 F (36.6 C) (Oral)   Resp 17   Ht 5\' 7"  (1.702 m)   Wt 65.8 kg   SpO2 100%   BMI 22.72 kg/m  Vital signs in last 24 hours: Temp:  [97.9 F (36.6 C)-98.1 F (36.7 C)] 97.9 F (36.6 C) (10/31 0726) Pulse Rate:  [43-71] 43 (10/31 0726) Resp:  [17-20] 17 (10/31 0726) BP: (122-129)/(56-61) 129/56 (10/31 0726) SpO2:  [99 %-100 %] 100 % (10/31 0726)  Intake/Output from previous day: 10/30 0701 - 10/31 0700 In: 120 [P.O.:120] Out: 350 [Urine:350] Intake/Output this shift: No intake/output data recorded. Nutritional status:  Diet Order            Diet Heart Room service appropriate? Yes; Fluid consistency: Thin  Diet effective now              Neurologic Exam: Mental Status: Alert, oriented but some difficulty with memory .  Speech fluent but dysarthric.  Able to follow 3 step commands without difficulty. Cranial Nerves: II: Discs flat bilaterally; Visual fields grossly normal, pupils equal, round, reactive to light and accommodation III,IV, VI: ptosis not present, extra-ocular motions intact bilaterally V,VII: smile symmetric, facial light touch sensation normal bilaterally VIII: hearing normal bilaterally IX,X: gag reflex present XI: bilateral shoulder shrug XII: midline tongue extension Motor: Patient able to lift both upper extremities against gravity with no focal weakness noted.  Patient able to lift both lower extremities higher off the bed today.   Sensory: Pinprick and light touch intact throughout, bilaterally Deep Tendon Reflexes: Symmetric throughout Plantars: Right: mute                              Left: mute Cerebellar: Dysmetria, left greater than right Gait: not tested due to safety concerns  Lab Results: Basic Metabolic Panel: Recent Labs  Lab 01/21/19 2104 01/22/19 0704  NA 136 137  K 4.7 3.1*   CL 100 102  CO2 25 24  GLUCOSE 120* 91  BUN 21 17  CREATININE 1.45* 1.25*  CALCIUM 9.3 8.6*    Liver Function Tests: Recent Labs  Lab 01/21/19 2104  AST 28  ALT 14  ALKPHOS 108  BILITOT 2.9*  PROT 7.8  ALBUMIN 4.7   No results for input(s): LIPASE, AMYLASE in the last 168 hours. No results for input(s): AMMONIA in the last 168 hours.  CBC: Recent Labs  Lab 01/21/19 2104 01/22/19 0818  WBC 10.9* 6.8  NEUTROABS 8.7*  --   HGB 10.4* 9.9*  HCT 30.6* 29.0*  MCV 97.5 90.9  PLT 343 327    Cardiac Enzymes: Recent Labs  Lab 01/21/19 2104  CKTOTAL 403*    Lipid Panel: No results for input(s): CHOL, TRIG, HDL, CHOLHDL, VLDL, LDLCALC in the last 168 hours.  CBG: No results for input(s): GLUCAP in the last 168 hours.  Microbiology: Results for orders placed or performed during the hospital encounter of 01/21/19  SARS CORONAVIRUS 2 (TAT 6-24 HRS) Nasopharyngeal Nasopharyngeal Swab     Status: None   Collection Time: 01/21/19 11:35 PM   Specimen: Nasopharyngeal Swab  Result Value Ref Range Status   SARS Coronavirus 2 NEGATIVE NEGATIVE Final    Comment: (NOTE) SARS-CoV-2 target nucleic acids are NOT DETECTED. The SARS-CoV-2 RNA is generally detectable in upper and lower respiratory  specimens during the acute phase of infection. Negative results do not preclude SARS-CoV-2 infection, do not rule out co-infections with other pathogens, and should not be used as the sole basis for treatment or other patient management decisions. Negative results must be combined with clinical observations, patient history, and epidemiological information. The expected result is Negative. Fact Sheet for Patients: SugarRoll.be Fact Sheet for Healthcare Providers: https://www.woods-mathews.com/ This test is not yet approved or cleared by the [place] FDA and  has been authorized for detection and/or diagnosis of SARS-CoV-2 by FDA under an  Emergency Use Authorization (EUA). This EUA will remain  in effect (meaning this test can be used) for the duration of the COVID-19 declaration under Section 56 4(b)(1) of the Act, 21 U.S.C. section 360bbb-3(b)(1), unless the authorization is terminated or revoked sooner. Performed at [facility], [place] [address], [place], [place] [address]     Coagulation Studies: No results for input(s): LABPROT, INR in the last 72 hours.  Imaging: Mr Jeri Cos [id number] Contrast  Result Date: 01/22/2019 CLINICAL DATA:  68 year old male with chronic multiple sclerosis. Multiple recent falls. Requires a walker. EXAM: MRI HEAD WITHOUT AND WITH CONTRAST TECHNIQUE: Multiplanar, multiecho pulse sequences of the brain and surrounding structures were obtained without and with intravenous contrast. CONTRAST:  29mL GADAVIST GADOBUTROL 1 MMOL/ML IV SOLN COMPARISON:  Brain MRI 03/03/2015 and earlier. Cervical spine MRI reported separately today. FINDINGS: Brain: Cerebral volume loss since 2016 appears to be generalized. Ventricular prominence has not significantly changed, and the temporal horns remain diminutive. No restricted diffusion to suggest acute infarction. No midline shift, mass effect, evidence of mass lesion, ventriculomegaly, extra-axial collection or acute intracranial hemorrhage. Cervicomedullary junction and pituitary are within normal limits. No abnormal enhancement identified. No dural thickening. Encephalomalacia in the medial left parietal lobe (series 16, image 37). No associated enhancement. Elsewhere patchy white matter T2 and FLAIR hyperintensity has only mildly progressed since 2016. no other cortical encephalomalacia identified. No chronic cerebral blood products. Moderate T2 heterogeneity in the deep gray nuclei is slightly worse on the right and not significantly changed. Brainstem and cerebellum also appear stable since 2016. Vascular: Major intracranial vascular flow voids are stable since 2016.  The major dural venous sinuses are enhancing and appear to be patent. Skull and upper cervical spine: Cervical spine today is reported separately. Bone marrow signal remains normal. Sinuses/Orbits: Orbits appear negative. Chronic sphenoid sinusitis worse on the left. Other paranasal sinuses remain well pneumatized. Other: Mastoids remain clear. Visible internal auditory structures appear normal. There is a chronic right superior convexity scalp soft tissue lesion with FLAIR hyperintensity and enhancement measuring 14-15 millimeters (series 16, image 36) which has not significantly changed since 2016. Other scalp soft tissues appear negative. IMPRESSION: 1. No acute intracranial abnormality. 2. Encephalomalacia at the site of the medial left parietal lobe enhancing lesion which must have been tumefactive demyelination. Otherwise mild progression since 2016 of white matter signal changes and generalized cerebral volume loss. 3. Chronic sphenoid sinusitis. Chronic benign right parietal scalp lesion. Electronically Signed   By: Genevie Ann M.D.   On: 01/22/2019 12:56   Mr Cervical Spine W Wo Contrast  Result Date: 01/22/2019 CLINICAL DATA:  68 year old male with chronic multiple sclerosis. Multiple recent falls. Requires a walker. EXAM: MRI CERVICAL SPINE WITHOUT AND WITH CONTRAST TECHNIQUE: Multiplanar and multiecho pulse sequences of the cervical spine, to include the craniocervical junction and cervicothoracic junction, were obtained without and with intravenous contrast. CONTRAST:  62mL GADAVIST GADOBUTROL 1 MMOL/ML  IV SOLN COMPARISON:  Brain MRI today reported separately. No prior cervical spine imaging. FINDINGS: Alignment: Mild straightening of cervical lordosis. Mild degenerative appearing retrolisthesis at C3-C4 and C4-C5. Vertebrae: There is degenerative appearing right foraminal and far lateral endplate marrow edema at C6-C7 (series 11, image 7). No other marrow edema or evidence of acute osseous abnormality.  Background bone marrow signal is within normal limits. Cord: No definite cervical spinal cord signal abnormality, despite degenerative spinal stenosis with cord mass effect from C3-C4 through C5-C6 (see below). Spinal cord volume appears relatively normal. No abnormal intradural enhancement. No dural thickening. Posterior Fossa, vertebral arteries, paraspinal tissues: No signal abnormality in the cervical spine ligamentous complex. Cervicomedullary junction is within normal limits. Preserved major vascular flow voids in the neck. Negative neck soft tissues. Negative visible lung apices. Disc levels: C2-C3: Up to moderate left side facet degeneration and hypertrophy. Moderate to severe left C3 foraminal stenosis. C3-C4: Mild retrolisthesis with disc space loss and circumferential disc osteophyte complex. Mild to moderate posterior element hypertrophy. Mild spinal stenosis and spinal cord mass effect. Severe bilateral C4 foraminal stenosis. C4-C5: Mild retrolisthesis and disc space loss with circumferential disc osteophyte complex. Broad-based posterior component. Mild-to-moderate spinal stenosis and spinal cord mass effect (series 12, image 12). Severe bilateral C5 foraminal stenosis. C5-C6: Disc space loss with circumferential disc osteophyte complex. Broad-based posterior component with mild posterior element hypertrophy. Mild spinal stenosis. Mild if any spinal cord mass effect. Severe bilateral C6 foraminal stenosis. C6-C7: Disc space loss with circumferential disc bulge. Mostly foraminal and far lateral endplate spurring. Moderate ligament flavum and right greater than left facet hypertrophy. No significant spinal stenosis. Moderate left and severe right C7 foraminal stenosis. C7-T1: Moderate facet hypertrophy. Mild to moderate bilateral C8 foraminal stenosis. No upper thoracic spinal stenosis. IMPRESSION: 1. A no cervical spinal cord demyelination identified, and no definite cord signal abnormality despite  multilevel degenerative spinal stenosis (see #2). 2. Widespread cervical spine degeneration. Degenerative spinal stenosis C3-C4 through C5-C6 with maximal cord mass effect at C4-C5 (mild-to-moderate). Severe bilateral C4 through C6 foraminal stenosis. 3. Moderate to severe degenerative neural foraminal stenosis also at the left C3 and right greater than left C7 nerve levels. Electronically Signed   By: Genevie Ann M.D.   On: 01/22/2019 13:04    Medications:  I have reviewed the patient's current medications. Scheduled: . amLODipine  5 mg Oral Daily  . donepezil  10 mg Oral QHS  . enoxaparin (LOVENOX) injection  40 mg Subcutaneous Q24H  . ezetimibe  10 mg Oral QHS  . finasteride  5 mg Oral Daily  . mirtazapine  7.5 mg Oral QHS  . sertraline  25 mg Oral Daily  . 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  Neurontin discontinued.  Imaging without enhancing lesions.    Recommendations: 1. Continue therapy 2. Remain off Neurontin 3. To follow up with neurology on an outpatient basis.  No further neurologic intervention is recommended at this time.  If further questions arise, please call or page at that time.  Thank you for allowing neurology to participate in the care of this patient.   LOS: 1 day   Alexis Goodell, MD Neurology [phone] 01/24/2019  9:46 AM

## 2019-01-24 NOTE — Progress Notes (Addendum)
[facility] NAME: Philip Gordon    MR#:  [id number]  DATE OF BIRTH:  1950/08/13  SUBJECTIVE:  CHIEF COMPLAINT:   Chief Complaint  Patient presents with  . Fall   Still weak. Declining over sometime No swallow issues No vision changes Lives alone  REVIEW OF SYSTEMS:    Review of Systems  Constitutional: Positive for malaise/fatigue. Negative for chills and fever.  HENT: Negative for sore throat.   Eyes: Negative for blurred vision, double vision and pain.  Respiratory: Negative for cough, hemoptysis, shortness of breath and wheezing.   Cardiovascular: Negative for chest pain, palpitations, orthopnea and leg swelling.  Gastrointestinal: Negative for abdominal pain, constipation, diarrhea, heartburn, nausea and vomiting.  Genitourinary: Negative for dysuria and hematuria.  Musculoskeletal: Positive for falls. Negative for back pain and joint pain.  Skin: Negative for rash.  Neurological: Negative for sensory change, speech change, focal weakness and headaches.  Endo/Heme/Allergies: Does not bruise/bleed easily.  Psychiatric/Behavioral: Negative for depression. The patient is not nervous/anxious.     DRUG ALLERGIES:  No Known Allergies  VITALS:  Blood pressure 124/61, pulse 66, temperature 98.1 F (36.7 C), temperature source Oral, resp. rate 18, height 5\' 7"  (1.702 m), weight 65.8 kg, SpO2 100 %.  PHYSICAL EXAMINATION:   Physical Exam  GENERAL:  68 y.o.-year-old patient lying in the bed with no acute distress.  EYES: Pupils equal, round, reactive to light and accommodation. No scleral icterus. Extraocular muscles intact.  HEENT: Head atraumatic, normocephalic. Oropharynx and nasopharynx clear.  NECK:  Supple, no jugular venous distention. No thyroid enlargement, no tenderness.  LUNGS: Normal breath sounds bilaterally, no wheezing, rales, rhonchi. No use of accessory muscles of respiration.  CARDIOVASCULAR: S1, S2  normal. No murmurs, rubs, or gallops.  ABDOMEN: Soft, nontender, nondistended. Bowel sounds present. No organomegaly or mass.  EXTREMITIES: No cyanosis, clubbing or edema b/l.    NEUROLOGIC: Cranial nerves II through XII are intact. No focal Motor or sensory deficits b/l.   PSYCHIATRIC: The patient is alert and oriented x 3.  SKIN: No obvious rash, lesion, or ulcer.   LABORATORY PANEL:   CBC Recent Labs  Lab 01/22/19 0818  WBC 6.8  HGB 9.9*  HCT 29.0*  PLT 327   ------------------------------------------------------------------------------------------------------------------ Chemistries  Recent Labs  Lab 01/21/19 2104 01/22/19 0704  NA 136 137  K 4.7 3.1*  CL 100 102  CO2 25 24  GLUCOSE 120* 91  BUN 21 17  CREATININE 1.45* 1.25*  CALCIUM 9.3 8.6*  AST 28  --   ALT 14  --   ALKPHOS 108  --   BILITOT 2.9*  --    ------------------------------------------------------------------------------------------------------------------  Cardiac Enzymes No results for input(s): TROPONINI in the last 168 hours. ------------------------------------------------------------------------------------------------------------------  RADIOLOGY:  Mr Jeri Cos Wo Contrast  Result Date: 01/22/2019 CLINICAL DATA:  68 year old male with chronic multiple sclerosis. Multiple recent falls. Requires a walker. EXAM: MRI HEAD WITHOUT AND WITH CONTRAST TECHNIQUE: Multiplanar, multiecho pulse sequences of the brain and surrounding structures were obtained without and with intravenous contrast. CONTRAST:  65mL GADAVIST GADOBUTROL 1 MMOL/ML IV SOLN COMPARISON:  Brain MRI 03/03/2015 and earlier. Cervical spine MRI reported separately today. FINDINGS: Brain: Cerebral volume loss since 2016 appears to be generalized. Ventricular prominence has not significantly changed, and the temporal horns remain diminutive. No restricted diffusion to suggest acute infarction. No midline shift, mass effect, evidence of mass  lesion, ventriculomegaly, extra-axial collection or acute intracranial hemorrhage. Cervicomedullary junction and  pituitary are within normal limits. No abnormal enhancement identified. No dural thickening. Encephalomalacia in the medial left parietal lobe (series 16, image 37). No associated enhancement. Elsewhere patchy white matter T2 and FLAIR hyperintensity has only mildly progressed since 2016. no other cortical encephalomalacia identified. No chronic cerebral blood products. Moderate T2 heterogeneity in the deep gray nuclei is slightly worse on the right and not significantly changed. Brainstem and cerebellum also appear stable since 2016. Vascular: Major intracranial vascular flow voids are stable since 2016. The major dural venous sinuses are enhancing and appear to be patent. Skull and upper cervical spine: Cervical spine today is reported separately. Bone marrow signal remains normal. Sinuses/Orbits: Orbits appear negative. Chronic sphenoid sinusitis worse on the left. Other paranasal sinuses remain well pneumatized. Other: Mastoids remain clear. Visible internal auditory structures appear normal. There is a chronic right superior convexity scalp soft tissue lesion with FLAIR hyperintensity and enhancement measuring 14-15 millimeters (series 16, image 36) which has not significantly changed since 2016. Other scalp soft tissues appear negative. IMPRESSION: 1. No acute intracranial abnormality. 2. Encephalomalacia at the site of the medial left parietal lobe enhancing lesion which must have been tumefactive demyelination. Otherwise mild progression since 2016 of white matter signal changes and generalized cerebral volume loss. 3. Chronic sphenoid sinusitis. Chronic benign right parietal scalp lesion. Electronically Signed   By: Genevie Ann M.D.   On: 01/22/2019 12:56   Mr Cervical Spine W Wo Contrast  Result Date: 01/22/2019 CLINICAL DATA:  68 year old male with chronic multiple sclerosis. Multiple recent  falls. Requires a walker. EXAM: MRI CERVICAL SPINE WITHOUT AND WITH CONTRAST TECHNIQUE: Multiplanar and multiecho pulse sequences of the cervical spine, to include the craniocervical junction and cervicothoracic junction, were obtained without and with intravenous contrast. CONTRAST:  38mL GADAVIST GADOBUTROL 1 MMOL/ML IV SOLN COMPARISON:  Brain MRI today reported separately. No prior cervical spine imaging. FINDINGS: Alignment: Mild straightening of cervical lordosis. Mild degenerative appearing retrolisthesis at C3-C4 and C4-C5. Vertebrae: There is degenerative appearing right foraminal and far lateral endplate marrow edema at C6-C7 (series 11, image 7). No other marrow edema or evidence of acute osseous abnormality. Background bone marrow signal is within normal limits. Cord: No definite cervical spinal cord signal abnormality, despite degenerative spinal stenosis with cord mass effect from C3-C4 through C5-C6 (see below). Spinal cord volume appears relatively normal. No abnormal intradural enhancement. No dural thickening. Posterior Fossa, vertebral arteries, paraspinal tissues: No signal abnormality in the cervical spine ligamentous complex. Cervicomedullary junction is within normal limits. Preserved major vascular flow voids in the neck. Negative neck soft tissues. Negative visible lung apices. Disc levels: C2-C3: Up to moderate left side facet degeneration and hypertrophy. Moderate to severe left C3 foraminal stenosis. C3-C4: Mild retrolisthesis with disc space loss and circumferential disc osteophyte complex. Mild to moderate posterior element hypertrophy. Mild spinal stenosis and spinal cord mass effect. Severe bilateral C4 foraminal stenosis. C4-C5: Mild retrolisthesis and disc space loss with circumferential disc osteophyte complex. Broad-based posterior component. Mild-to-moderate spinal stenosis and spinal cord mass effect (series 12, image 12). Severe bilateral C5 foraminal stenosis. C5-C6: Disc space  loss with circumferential disc osteophyte complex. Broad-based posterior component with mild posterior element hypertrophy. Mild spinal stenosis. Mild if any spinal cord mass effect. Severe bilateral C6 foraminal stenosis. C6-C7: Disc space loss with circumferential disc bulge. Mostly foraminal and far lateral endplate spurring. Moderate ligament flavum and right greater than left facet hypertrophy. No significant spinal stenosis. Moderate left and severe right C7 foraminal stenosis. C7-T1:  Moderate facet hypertrophy. Mild to moderate bilateral C8 foraminal stenosis. No upper thoracic spinal stenosis. IMPRESSION: 1. A no cervical spinal cord demyelination identified, and no definite cord signal abnormality despite multilevel degenerative spinal stenosis (see #2). 2. Widespread cervical spine degeneration. Degenerative spinal stenosis C3-C4 through C5-C6 with maximal cord mass effect at C4-C5 (mild-to-moderate). Severe bilateral C4 through C6 foraminal stenosis. 3. Moderate to severe degenerative neural foraminal stenosis also at the left C3 and right greater than left C7 nerve levels. Electronically Signed   By: Genevie Ann M.D.   On: 01/22/2019 13:04     ASSESSMENT AND PLAN:   * Gait abnormality due to long standing MS  MRI with nothing acute Discussed with Neurology. No IV steroids needed for MS PT- SNF recommended. Patient lives alone. Not safe to discharge home as he has already had multiple falls Continue home meds Stopped neurontin as patient feels symptoms worse after starting it. Appreciate neurology input changed to inpatient as advised by [facility]  * CKD 3- stable No AKI Stopped IVF   * Hypertension -home dose antihypertensives    * Hypercholesteremia -home dose antilipid  DVT prophylaxis Lovenox  D/C to SNF on Monday after 3 night stay  All the records are reviewed and case discussed with Care Management/Social Worker Management plans discussed with the patient, family and they are in  agreement.  CODE STATUS: FULL CODE  TOTAL TIME TAKING CARE OF THIS PATIENT: 35 minutes.   POSSIBLE D/C IN 1-2 DAYS, DEPENDING ON CLINICAL CONDITION.  Leia Alf Ricard Faulkner M.D on 01/24/2019 at 6:25 AM  Between 7am to 6pm - Pager - [phone]  After 6pm go to [email or web address] - password [email or web address] [facility]  Office  [phone]  CC: Primary care physician; Einar Pheasant, MD  Note: This dictation was prepared with Dragon dictation along with smaller phrase technology. Any transcriptional errors that result from this process are unintentional.

## 2019-01-25 NOTE — Progress Notes (Signed)
Pts BP 115/49, Norvasc held. MD Sumayya notified.

## 2019-01-25 NOTE — Progress Notes (Signed)
PROGRESS NOTE    RY KHAM  [id number] DOB: 1951/02/05 DOA: 01/21/2019 PCP: Einar Pheasant, MD  Brief Narrative: 68 year old gentleman with past medical history significant for MS and hypertension was admitted to the hospital with complaint of multiple falls.  Patient was recently started on gabapentin and thinks that the symptoms are related to that medicine.  It was discontinued by neurology.  Patient was converted to inpatient for SNF requirement.  No MS flare.  Assessment & Plan:   Principal Problem:   Weakness Active Problems:   Hypertension   Hypercholesteremia   Multiple sclerosis (HCC)   AKI (acute kidney injury) ([place])   Fall  Gait abnormalities due to longstanding MS with no acute flare. Neurology was consulted and they do not think that he need IV steroids as MRI was without any new lesion.  Patient lives alone and does not fell to be safe discharging back home.  He agreed to go to SNF for rehab.  Awaiting SNF placement, which most likely can be done tomorrow. -Continue PT/OT.  CKD stage III.  Stable  Hypertension.  Continue home meds.  Hyperlipidemia.  Continue home meds.  DVT prophylaxis: Lovenox Code Status: Full Family Communication: Discussed with patient. Disposition Plan: Discharge to SNF tomorrow. Consultants:   Neurology  Subjective: She was feeling better when seen this morning.  No new complaints.  Waiting for SNF placement.  Objective: Vitals:   01/24/19 2358 01/25/19 0737 01/25/19 0905 01/25/19 1514  BP: 115/61 120/61 (!) 115/49 (!) 116/46  Pulse: (!) 59 (!) 42 (!) 53 66  Resp: 16 17  18   Temp: 97.9 F (36.6 C) 97.9 F (36.6 C)  (!) 97.5 F (36.4 C)  TempSrc: Oral Oral  Oral  SpO2: 98% 99% 99% 100%  Weight:      Height:        Intake/Output Summary (Last 24 hours) at 01/25/2019 1813 Last data filed at 01/25/2019 1043 Gross per 24 hour  Intake -  Output 500 ml  Net -500 ml   Filed Weights   01/22/19 0208  Weight: 65.8  kg    Examination:  General exam: Appears calm and comfortable  Respiratory system: Clear to auscultation. Respiratory effort normal. Cardiovascular system: S1 & S2 heard, RRR. No JVD, murmurs, rubs, gallops or clicks. No pedal edema. Gastrointestinal system: Abdomen is nondistended, soft and nontender. No organomegaly or masses felt. Normal bowel sounds heard. Central nervous system: Alert and oriented. No focal neurological deficits. Extremities: No edema.  Pulses intact and symmetrical. Skin: No rashes, lesions or ulcers Psychiatry: Judgement and insight appear normal. Mood & affect appropriate.   Data Reviewed: I have personally reviewed following labs and imaging studies  CBC: Recent Labs  Lab 01/21/19 2104 01/22/19 0818  WBC 10.9* 6.8  NEUTROABS 8.7*  --   HGB 10.4* 9.9*  HCT 30.6* 29.0*  MCV 97.5 90.9  PLT 343 [id number]   Basic Metabolic Panel: Recent Labs  Lab 01/21/19 2104 01/22/19 0704  NA 136 137  K 4.7 3.1*  CL 100 102  CO2 25 24  GLUCOSE 120* 91  BUN 21 17  CREATININE 1.45* 1.25*  CALCIUM 9.3 8.6*   GFR: Estimated Creatinine Clearance: 51.2 mL/min (A) (by C-G formula based on SCr of 1.25 mg/dL (H)). Liver Function Tests: Recent Labs  Lab 01/21/19 2104  AST 28  ALT 14  ALKPHOS 108  BILITOT 2.9*  PROT 7.8  ALBUMIN 4.7   No results for input(s): LIPASE, AMYLASE in the last 168 hours.  No results for input(s): AMMONIA in the last 168 hours. Coagulation Profile: No results for input(s): INR, PROTIME in the last 168 hours. Cardiac Enzymes: Recent Labs  Lab 01/21/19 2104  CKTOTAL 403*   BNP (last 3 results) No results for input(s): PROBNP in the last 8760 hours. HbA1C: No results for input(s): HGBA1C in the last 72 hours. CBG: No results for input(s): GLUCAP in the last 168 hours. Lipid Profile: No results for input(s): CHOL, HDL, LDLCALC, TRIG, CHOLHDL, LDLDIRECT in the last 72 hours. Thyroid Function Tests: No results for input(s): TSH,  T4TOTAL, FREET4, T3FREE, THYROIDAB in the last 72 hours. Anemia Panel: No results for input(s): VITAMINB12, FOLATE, FERRITIN, TIBC, IRON, RETICCTPCT in the last 72 hours. Sepsis Labs: Recent Labs  Lab 01/21/19 2151 01/21/19 2329  LATICACIDVEN 1.5 1.3    Recent Results (from the past 240 hour(s))  SARS CORONAVIRUS 2 (TAT 6-24 HRS) Nasopharyngeal Nasopharyngeal Swab     Status: None   Collection Time: 01/21/19 11:35 PM   Specimen: Nasopharyngeal Swab  Result Value Ref Range Status   SARS Coronavirus 2 NEGATIVE NEGATIVE Final    Comment: (NOTE) SARS-CoV-2 target nucleic acids are NOT DETECTED. The SARS-CoV-2 RNA is generally detectable in upper and lower respiratory specimens during the acute phase of infection. Negative results do not preclude SARS-CoV-2 infection, do not rule out co-infections with other pathogens, and should not be used as the sole basis for treatment or other patient management decisions. Negative results must be combined with clinical observations, patient history, and epidemiological information. The expected result is Negative. Fact Sheet for Patients: SugarRoll.be Fact Sheet for Healthcare Providers: https://www.woods-mathews.com/ This test is not yet approved or cleared by the [place] FDA and  has been authorized for detection and/or diagnosis of SARS-CoV-2 by FDA under an Emergency Use Authorization (EUA). This EUA will remain  in effect (meaning this test can be used) for the duration of the COVID-19 declaration under Section 56 4(b)(1) of the Act, 21 U.S.C. section 360bbb-3(b)(1), unless the authorization is terminated or revoked sooner. Performed at [facility], [place] [address], [place], [place] [address]      Radiology Studies: No results found.  Scheduled Meds: . amLODipine  5 mg Oral Daily  . donepezil  10 mg Oral QHS  . enoxaparin (LOVENOX) injection  40 mg Subcutaneous Q24H  .  ezetimibe  10 mg Oral QHS  . finasteride  5 mg Oral Daily  . mirtazapine  7.5 mg Oral QHS  . sertraline  25 mg Oral Daily  . vitamin B-12  1,000 mcg Oral Daily   Continuous Infusions:   LOS: 2 days   Time spent: 30 minutes  Lorella Nimrod, MD [facility] Pager [phone]  If 7PM-7AM, please contact night-coverage [email or web address] Password [email or web address] 01/25/2019, 6:13 PM

## 2019-01-26 NOTE — Care Management Important Message (Signed)
Important Message  Patient Details  Name: Philip Gordon MRN: [id number] Date of Birth: 20-Jul-1950   Medicare Important Message Given:  Yes     [name] 01/26/2019, 12:38 PM

## 2019-01-26 NOTE — Progress Notes (Signed)
Patients HR between 38-40.Marland Kitchen New order Dr  Lorella Nimrod for 12 lead EKG STAT. Patient is alert at this time.

## 2019-01-26 NOTE — Progress Notes (Signed)
Physical Therapy Treatment Patient Details Name: Philip Gordon MRN: [id number] DOB: 21-Nov-1950 Today's Date: 01/26/2019    History of Present Illness Pt is a 68 y.o. male who presents to the ED on 10/28 with a complaint of multiple falls at home.  Pt with PMH of depression, HTN, MS, hypercholesterolemia, prostate cancer. Pt attributes recent falls to having started takin gabapentin, reporting no falls prior to this. Was also determined to have mild AKI in the ED.    PT Comments    Pt agreeable to PT and eager to get out of bed. Denies pain. Mod I bed mobility. Donned pt shoes with AFO prior to ambulation/exercise. STS from bed and several times from recliner with Min A. Short ambulation bed to chair and fwd/bkwd from chair for exercise positioning. Participates in stand and seated exercises with rest breaks as needed. Encouraged upright posture during stand exercises and avoiding momentum swing for hip exercises. Pt able to demonstrate learning with cues. Pt remained up in chair wishing BLEs dependent post session. Continue PT to progress strength, endurance to improve all functional mobility and reduce risk of falls.   Follow Up Recommendations  SNF     Equipment Recommendations       Recommendations for Other Services       Precautions / Restrictions Precautions Precautions: Fall Restrictions Weight Bearing Restrictions: No    Mobility  Bed Mobility Overal bed mobility: Modified Independent Bed Mobility: Supine to Sit     Supine to sit: Modified independent (Device/Increase time)     General bed mobility comments: No direct assist  Transfers Overall transfer level: Needs assistance Equipment used: Rolling walker (2 wheeled) Transfers: Sit to/from Stand Sit to Stand: Min assist         General transfer comment: slow to rise; with increased time to extend trunk   Ambulation/Gait Ambulation/Gait assistance: Min assist Gait Distance (Feet): 5 Feet Assistive  device: Rolling walker (2 wheeled) Gait Pattern/deviations: Step-to pattern Gait velocity: slow Gait velocity interpretation: <1.31 ft/sec, indicative of household ambulator General Gait Details: Shoes with AFO's donned prior to ambulation/exercises   Stairs             Wheelchair Mobility    Modified Rankin (Stroke Patients Only)       Balance Overall balance assessment: Needs assistance Sitting-balance support: Bilateral upper extremity supported;Feet supported Sitting balance-Leahy Scale: Fair(Fair +)     Standing balance support: Bilateral upper extremity supported Standing balance-Leahy Scale: Fair                              Cognition Arousal/Alertness: Awake/alert Behavior During Therapy: WFL for tasks assessed/performed Overall Cognitive Status: Within Functional Limits for tasks assessed                                        Exercises General Exercises - Lower Extremity Gluteal Sets: Strengthening;Both;10 reps(5 seat and 5 standing) Long Arc Quad: AROM;Both;5 reps Hip ABduction/ADduction: Strengthening;Both;10 reps;Standing Straight Leg Raises: Strengthening;Both;10 reps;Standing Hip Flexion/Marching: Strengthening;Both;10 reps;Standing(also 5 reps seated each) Mini-Sqauts: Strengthening;10 reps Other Exercises Other Exercises: stand hip ext B 10 reps each Other Exercises: fwd/bkwd walk from chair 5 ft each    General Comments        Pertinent Vitals/Pain Pain Assessment: No/denies pain    Home Living  Prior Function            PT Goals (current goals can now be found in the care plan section) Progress towards PT goals: Progressing toward goals    Frequency    Min 2X/week      PT Plan Current plan remains appropriate    Co-evaluation              AM-PAC PT "6 Clicks" Mobility   Outcome Measure  Help needed turning from your back to your side while in a flat bed  without using bedrails?: A Little Help needed moving from lying on your back to sitting on the side of a flat bed without using bedrails?: A Little Help needed moving to and from a bed to a chair (including a wheelchair)?: A Little Help needed standing up from a chair using your arms (e.g., wheelchair or bedside chair)?: A Little Help needed to walk in hospital room?: A Lot Help needed climbing 3-5 steps with a railing? : A Lot 6 Click Score: 16    End of Session Equipment Utilized During Treatment: Gait belt Activity Tolerance: Patient tolerated treatment well Patient left: in chair;with call bell/phone within reach;with chair alarm set;Other (comment)(pt wished LEs to be dependent)   PT Visit Diagnosis: Unsteadiness on feet (R26.81);Other abnormalities of gait and mobility (R26.89);Repeated falls (R29.6);Muscle weakness (generalized) (M62.81)     Time: [id number] PT Time Calculation (min) (ACUTE ONLY): 33 min  Charges:  $Therapeutic Exercise: 8-22 mins $Therapeutic Activity: 8-22 mins                      Philip Gordon, PTA 01/26/2019, 5:59 PM

## 2019-01-26 NOTE — Progress Notes (Signed)
PROGRESS NOTE    LANI Gordon  [id number] DOB: 1950/05/27 DOA: 01/21/2019 PCP: Einar Pheasant, MD   Brief Narrative: 68 year old gentleman with past medical history significant for MS and hypertension was admitted to the hospital with complaint of multiple falls.  Patient was recently started on gabapentin and thinks that the symptoms are related to that medicine.  It was discontinued by neurology.  Patient was converted to inpatient for SNF requirement.  No MS flare.  Assessment & Plan:   Principal Problem:   Weakness Active Problems:   Hypertension   Hypercholesteremia   Multiple sclerosis (HCC)   AKI (acute kidney injury) ([place])   Fall  Gait abnormalities due to longstanding MS with no acute flare. Neurology was consulted and they do not think that he need IV steroids as MRI was without any new lesion.  Patient lives alone and does not fell to be safe discharging back home.  He agreed to go to SNF for rehab.  Awaiting SNF placement. -Continue PT/OT.  CKD stage III.  Stable  Hypertension.  Continue home meds.  Hyperlipidemia.  Continue home meds.   DVT prophylaxis: Lovenox Code Status: Full Family Communication: Discussed with patient. Disposition Plan: Discharge to SNF tomorrow.  Consultants:   Neurology  Subjective: Patient is feeling better when seen this morning has no new complaints.  Per patient he is not getting any physical therapy and he stayed back in bed for the whole weekend.  Objective: Vitals:   01/25/19 0905 01/25/19 1514 01/25/19 2321 01/26/19 0737  BP: (!) 115/49 (!) 116/46 (!) 132/49 121/62  Pulse: (!) 53 66 62 (!) 43  Resp:  18 16   Temp:  (!) 97.5 F (36.4 C) 97.7 F (36.5 C) 97.9 F (36.6 C)  TempSrc:  Oral  Oral  SpO2: 99% 100% 99% 100%  Weight:      Height:        Intake/Output Summary (Last 24 hours) at 01/26/2019 1536 Last data filed at 01/26/2019 1300 Gross per 24 hour  Intake 360 ml  Output 250 ml  Net 110 ml    Filed Weights   01/22/19 0208  Weight: 65.8 kg    Examination:  General exam: Appears calm and comfortable  Respiratory system: Clear to auscultation. Respiratory effort normal. Cardiovascular system: S1 & S2 heard, RRR. No JVD, murmurs, rubs, gallops or clicks. No pedal edema. Gastrointestinal system: Abdomen is nondistended, soft and nontender. No organomegaly or masses felt. Normal bowel sounds heard. Central nervous system: Alert and oriented. No focal neurological deficits. Extremities: No edema or cyanosis.  Pulses intact and symmetrical. Skin: No rashes, lesions or ulcers. Psychiatry: Judgement and insight appear normal. Mood & affect appropriate.   Data Reviewed: I have personally reviewed following labs and imaging studies  CBC: Recent Labs  Lab 01/21/19 2104 01/22/19 0818  WBC 10.9* 6.8  NEUTROABS 8.7*  --   HGB 10.4* 9.9*  HCT 30.6* 29.0*  MCV 97.5 90.9  PLT 343 [id number]   Basic Metabolic Panel: Recent Labs  Lab 01/21/19 2104 01/22/19 0704  NA 136 137  K 4.7 3.1*  CL 100 102  CO2 25 24  GLUCOSE 120* 91  BUN 21 17  CREATININE 1.45* 1.25*  CALCIUM 9.3 8.6*   GFR: Estimated Creatinine Clearance: 51.2 mL/min (A) (by C-G formula based on SCr of 1.25 mg/dL (H)). Liver Function Tests: Recent Labs  Lab 01/21/19 2104  AST 28  ALT 14  ALKPHOS 108  BILITOT 2.9*  PROT 7.8  ALBUMIN  4.7   No results for input(s): LIPASE, AMYLASE in the last 168 hours. No results for input(s): AMMONIA in the last 168 hours. Coagulation Profile: No results for input(s): INR, PROTIME in the last 168 hours. Cardiac Enzymes: Recent Labs  Lab 01/21/19 2104  CKTOTAL 403*   BNP (last 3 results) No results for input(s): PROBNP in the last 8760 hours. HbA1C: No results for input(s): HGBA1C in the last 72 hours. CBG: No results for input(s): GLUCAP in the last 168 hours. Lipid Profile: No results for input(s): CHOL, HDL, LDLCALC, TRIG, CHOLHDL, LDLDIRECT in the last 72 hours.  Thyroid Function Tests: No results for input(s): TSH, T4TOTAL, FREET4, T3FREE, THYROIDAB in the last 72 hours. Anemia Panel: No results for input(s): VITAMINB12, FOLATE, FERRITIN, TIBC, IRON, RETICCTPCT in the last 72 hours. Sepsis Labs: Recent Labs  Lab 01/21/19 2151 01/21/19 2329  LATICACIDVEN 1.5 1.3    Recent Results (from the past 240 hour(s))  SARS CORONAVIRUS 2 (TAT 6-24 HRS) Nasopharyngeal Nasopharyngeal Swab     Status: None   Collection Time: 01/21/19 11:35 PM   Specimen: Nasopharyngeal Swab  Result Value Ref Range Status   SARS Coronavirus 2 NEGATIVE NEGATIVE Final    Comment: (NOTE) SARS-CoV-2 target nucleic acids are NOT DETECTED. The SARS-CoV-2 RNA is generally detectable in upper and lower respiratory specimens during the acute phase of infection. Negative results do not preclude SARS-CoV-2 infection, do not rule out co-infections with other pathogens, and should not be used as the sole basis for treatment or other patient management decisions. Negative results must be combined with clinical observations, patient history, and epidemiological information. The expected result is Negative. Fact Sheet for Patients: SugarRoll.be Fact Sheet for Healthcare Providers: https://www.woods-mathews.com/ This test is not yet approved or cleared by the [place] FDA and  has been authorized for detection and/or diagnosis of SARS-CoV-2 by FDA under an Emergency Use Authorization (EUA). This EUA will remain  in effect (meaning this test can be used) for the duration of the COVID-19 declaration under Section 56 4(b)(1) of the Act, 21 U.S.C. section 360bbb-3(b)(1), unless the authorization is terminated or revoked sooner. Performed at [facility], [place] [address], [place], [place] [address]      Radiology Studies: No results found.  Scheduled Meds: . amLODipine  5 mg Oral Daily  . donepezil  10 mg Oral QHS  . enoxaparin  (LOVENOX) injection  40 mg Subcutaneous Q24H  . ezetimibe  10 mg Oral QHS  . finasteride  5 mg Oral Daily  . mirtazapine  7.5 mg Oral QHS  . sertraline  25 mg Oral Daily  . vitamin B-12  1,000 mcg Oral Daily   Continuous Infusions:   LOS: 3 days   Time spent: 40 minutes  Lorella Nimrod, MD [facility] Pager [phone]  If 7PM-7AM, please contact night-coverage [email or web address] Password [facility] 01/26/2019, 3:36 PM

## 2019-01-27 DIAGNOSIS — N183 Chronic kidney disease, stage 3 unspecified: Secondary | ICD-10-CM | POA: Diagnosis not present

## 2019-01-27 DIAGNOSIS — I959 Hypotension, unspecified: Secondary | ICD-10-CM | POA: Diagnosis not present

## 2019-01-27 DIAGNOSIS — F17220 Nicotine dependence, chewing tobacco, uncomplicated: Secondary | ICD-10-CM | POA: Diagnosis not present

## 2019-01-27 DIAGNOSIS — R531 Weakness: Secondary | ICD-10-CM | POA: Diagnosis not present

## 2019-01-27 DIAGNOSIS — N139 Obstructive and reflux uropathy, unspecified: Secondary | ICD-10-CM | POA: Diagnosis not present

## 2019-01-27 DIAGNOSIS — M21372 Foot drop, left foot: Secondary | ICD-10-CM | POA: Diagnosis not present

## 2019-01-27 DIAGNOSIS — G35 Multiple sclerosis: Secondary | ICD-10-CM | POA: Diagnosis not present

## 2019-01-27 DIAGNOSIS — N319 Neuromuscular dysfunction of bladder, unspecified: Secondary | ICD-10-CM | POA: Diagnosis not present

## 2019-01-27 DIAGNOSIS — M21371 Foot drop, right foot: Secondary | ICD-10-CM | POA: Diagnosis not present

## 2019-01-27 DIAGNOSIS — R001 Bradycardia, unspecified: Secondary | ICD-10-CM | POA: Diagnosis not present

## 2019-01-27 DIAGNOSIS — E538 Deficiency of other specified B group vitamins: Secondary | ICD-10-CM | POA: Diagnosis not present

## 2019-01-27 DIAGNOSIS — I129 Hypertensive chronic kidney disease with stage 1 through stage 4 chronic kidney disease, or unspecified chronic kidney disease: Secondary | ICD-10-CM | POA: Diagnosis not present

## 2019-01-27 DIAGNOSIS — R296 Repeated falls: Secondary | ICD-10-CM | POA: Diagnosis not present

## 2019-01-27 DIAGNOSIS — E7800 Pure hypercholesterolemia, unspecified: Secondary | ICD-10-CM | POA: Diagnosis not present

## 2019-01-27 DIAGNOSIS — Z7401 Bed confinement status: Secondary | ICD-10-CM | POA: Diagnosis not present

## 2019-01-27 DIAGNOSIS — Z8546 Personal history of malignant neoplasm of prostate: Secondary | ICD-10-CM | POA: Diagnosis not present

## 2019-01-27 DIAGNOSIS — M2550 Pain in unspecified joint: Secondary | ICD-10-CM | POA: Diagnosis not present

## 2019-01-27 NOTE — Plan of Care (Signed)
Patient with no s/s of distress noted.  Discharging to [facility].  Belongings packed.  Problem: Education: Goal: Knowledge of General Education information will improve Description: Including pain rating scale, medication(s)/side effects and non-pharmacologic comfort measures 01/27/2019 1531 by Wallene Dales, RN Outcome: Adequate for Discharge 01/27/2019 1531 by Wallene Dales, RN Outcome: Adequate for Discharge 01/27/2019 1531 by Wallene Dales, RN Outcome: Adequate for Discharge   Problem: Health Behavior/Discharge Planning: Goal: Ability to manage health-related needs will improve 01/27/2019 1531 by Wallene Dales, RN Outcome: Adequate for Discharge 01/27/2019 1531 by Wallene Dales, RN Outcome: Adequate for Discharge 01/27/2019 1531 by Wallene Dales, RN Outcome: Adequate for Discharge   Problem: Clinical Measurements: Goal: Ability to maintain clinical measurements within normal limits will improve 01/27/2019 1531 by Wallene Dales, RN Outcome: Adequate for Discharge 01/27/2019 1531 by Wallene Dales, RN Outcome: Adequate for Discharge 01/27/2019 1531 by Wallene Dales, RN Outcome: Adequate for Discharge Goal: Will remain free from infection 01/27/2019 1531 by Wallene Dales, RN Outcome: Adequate for Discharge 01/27/2019 1531 by Wallene Dales, RN Outcome: Adequate for Discharge 01/27/2019 1531 by Wallene Dales, RN Outcome: Adequate for Discharge Goal: Diagnostic test results will improve 01/27/2019 1531 by Wallene Dales, RN Outcome: Adequate for Discharge 01/27/2019 1531 by Wallene Dales, RN Outcome: Adequate for Discharge 01/27/2019 1531 by Wallene Dales, RN Outcome: Adequate for Discharge Goal: Respiratory complications will improve 01/27/2019 1531 by Wallene Dales, RN Outcome: Adequate for Discharge 01/27/2019 1531 by Wallene Dales, RN Outcome: Adequate for Discharge 01/27/2019 1531 by Wallene Dales, RN Outcome: Adequate for Discharge Goal: Cardiovascular  complication will be avoided 01/27/2019 1531 by Wallene Dales, RN Outcome: Adequate for Discharge 01/27/2019 1531 by Wallene Dales, RN Outcome: Adequate for Discharge 01/27/2019 1531 by Wallene Dales, RN Outcome: Adequate for Discharge

## 2019-01-27 NOTE — Discharge Summary (Signed)
Physician Discharge Summary  ALGIN OSEN [id number] DOB: April 08, 1950 DOA: 01/21/2019  PCP: Einar Pheasant, MD  Admit date: 01/21/2019 Discharge date: 01/27/2019  Admitted From: Home. Disposition: Skilled nursing facility  Recommendations for Outpatient Follow-up:  1. Follow up with PCP in 1-2 weeks 2. Please obtain BMP/CBC in one week 3. Please follow up on the following pending results:  Home Health: No Equipment/Devices: None Discharge Condition: Stable CODE STATUS: Full Diet recommendation: Heart healthy  Brief/Interim Summary: 68 year old gentleman with past medical history significant for MS and hypertension was admitted to the hospital with complaint of multiple falls. Patient was recently started on gabapentin and thinks that the symptoms are related to that medicine. It was discontinued by neurology.  MRI did not show any new lesions so MS flare was not suspected.  Patient was evaluated by our physical therapist and thought to be a good candidate for SNF for further rehab.  His chronic conditions which include CKD stage III and hypertension remained stable and we continued his home meds.  Discharge Diagnoses:  Principal Problem:   Weakness Active Problems:   Hypertension   Hypercholesteremia   Multiple sclerosis ([place])   AKI (acute kidney injury) [facility])   Fall   Discharge Instructions  Discharge Instructions    Diet - low sodium heart healthy   Complete by: As directed    Discharge instructions   Complete by: As directed    It was pleasure taking care of you. We are discharging you to a rehab facility to get more physical therapy. As we discussed your heart rate do drop while you are sleeping, please discuss this with your PCP so they can make appropriate recommendations.  If you ever feel dizzy or short of breath please let your nurses know. Continue your appointments with your neurologist.   Increase activity slowly   Complete by: As directed       Allergies as of 01/27/2019   No Known Allergies     Medication List    STOP taking these medications   gabapentin 100 MG capsule Commonly known as: NEURONTIN     TAKE these medications   amLODipine 5 MG tablet Commonly known as: NORVASC Take 1 tablet (5 mg total) by mouth daily.   diphenhydrAMINE 25 mg capsule Commonly known as: BENADRYL Take 25 mg by mouth at bedtime as needed.   donepezil 10 MG tablet Commonly known as: ARICEPT Take 10 mg by mouth at bedtime.   ezetimibe 10 MG tablet Commonly known as: ZETIA Take 1 tablet (10 mg total) by mouth daily.   finasteride 5 MG tablet Commonly known as: PROSCAR Take 1 tablet (5 mg total) by mouth daily.   fluticasone 50 MCG/ACT nasal spray Commonly known as: FLONASE Place 2 sprays into both nostrils daily.   hydrochlorothiazide 12.5 MG capsule Commonly known as: MICROZIDE TAKE 1 CAPSULE BY MOUTH DAILY.   mirtazapine 15 MG tablet Commonly known as: REMERON TAKE 1/2 TABLET BY MOUTH AT BEDTIME   POTASSIUM PO Take 99 mg by mouth daily.   sertraline 25 MG tablet Commonly known as: ZOLOFT Take 1 tablet (25 mg total) by mouth daily.   tiZANidine 4 MG tablet Commonly known as: ZANAFLEX TAKE 1 TABLET BY MOUTH NIGHTLY   vitamin B-12 1000 MCG tablet Commonly known as: CYANOCOBALAMIN Take 1 tablet (1,000 mcg total) by mouth daily.       Contact information for follow-up providers    Einar Pheasant, MD Follow up in 1 week(s).   Specialty: Internal  Medicine Contact information: [id number] [place] [address] [phone]        Vladimir Crofts, MD Follow up in 1 week(s).   Specialty: Neurology Contact information: [place] [facility] [place] [address] [phone]            Contact information for after-discharge care    [facility] .   Service: Skilled Nursing Contact information: [place] [phone]                 No Known Allergies  Consultations: Neurology.  Procedures/Studies: Mr Jeri Cos Wo Contrast  Result Date: 01/22/2019 CLINICAL DATA:  68 year old male with chronic multiple sclerosis. Multiple recent falls. Requires a walker. EXAM: MRI HEAD WITHOUT AND WITH CONTRAST TECHNIQUE: Multiplanar, multiecho pulse sequences of the brain and surrounding structures were obtained without and with intravenous contrast. CONTRAST:  80mL GADAVIST GADOBUTROL 1 MMOL/ML IV SOLN COMPARISON:  Brain MRI 03/03/2015 and earlier. Cervical spine MRI reported separately today. FINDINGS: Brain: Cerebral volume loss since 2016 appears to be generalized. Ventricular prominence has not significantly changed, and the temporal horns remain diminutive. No restricted diffusion to suggest acute infarction. No midline shift, mass effect, evidence of mass lesion, ventriculomegaly, extra-axial collection or acute intracranial hemorrhage. Cervicomedullary junction and pituitary are within normal limits. No abnormal enhancement identified. No dural thickening. Encephalomalacia in the medial left parietal lobe (series 16, image 37). No associated enhancement. Elsewhere patchy white matter T2 and FLAIR hyperintensity has only mildly progressed since 2016. no other cortical encephalomalacia identified. No chronic cerebral blood products. Moderate T2 heterogeneity in the deep gray nuclei is slightly worse on the right and not significantly changed. Brainstem and cerebellum also appear stable since 2016. Vascular: Major intracranial vascular flow voids are stable since 2016. The major dural venous sinuses are enhancing and appear to be patent. Skull and upper cervical spine: Cervical spine today is reported separately. Bone marrow signal remains normal. Sinuses/Orbits: Orbits appear negative. Chronic sphenoid sinusitis worse on the left. Other paranasal sinuses remain  well pneumatized. Other: Mastoids remain clear. Visible internal auditory structures appear normal. There is a chronic right superior convexity scalp soft tissue lesion with FLAIR hyperintensity and enhancement measuring 14-15 millimeters (series 16, image 36) which has not significantly changed since 2016. Other scalp soft tissues appear negative. IMPRESSION: 1. No acute intracranial abnormality. 2. Encephalomalacia at the site of the medial left parietal lobe enhancing lesion which must have been tumefactive demyelination. Otherwise mild progression since 2016 of white matter signal changes and generalized cerebral volume loss. 3. Chronic sphenoid sinusitis. Chronic benign right parietal scalp lesion. Electronically Signed   By: Genevie Ann M.D.   On: 01/22/2019 12:56   Mr Cervical Spine W Wo Contrast  Result Date: 01/22/2019 CLINICAL DATA:  68 year old male with chronic multiple sclerosis. Multiple recent falls. Requires a walker. EXAM: MRI CERVICAL SPINE WITHOUT AND WITH CONTRAST TECHNIQUE: Multiplanar and multiecho pulse sequences of the cervical spine, to include the craniocervical junction and cervicothoracic junction, were obtained without and with intravenous contrast. CONTRAST:  15mL GADAVIST GADOBUTROL 1 MMOL/ML IV SOLN COMPARISON:  Brain MRI today reported separately. No prior cervical spine imaging. FINDINGS: Alignment: Mild straightening of cervical lordosis. Mild degenerative appearing retrolisthesis at C3-C4 and C4-C5. Vertebrae: There is degenerative appearing right foraminal and far lateral endplate marrow edema at C6-C7 (series 11, image 7). No other marrow edema or evidence of acute osseous  abnormality. Background bone marrow signal is within normal limits. Cord: No definite cervical spinal cord signal abnormality, despite degenerative spinal stenosis with cord mass effect from C3-C4 through C5-C6 (see below). Spinal cord volume appears relatively normal. No abnormal intradural enhancement. No  dural thickening. Posterior Fossa, vertebral arteries, paraspinal tissues: No signal abnormality in the cervical spine ligamentous complex. Cervicomedullary junction is within normal limits. Preserved major vascular flow voids in the neck. Negative neck soft tissues. Negative visible lung apices. Disc levels: C2-C3: Up to moderate left side facet degeneration and hypertrophy. Moderate to severe left C3 foraminal stenosis. C3-C4: Mild retrolisthesis with disc space loss and circumferential disc osteophyte complex. Mild to moderate posterior element hypertrophy. Mild spinal stenosis and spinal cord mass effect. Severe bilateral C4 foraminal stenosis. C4-C5: Mild retrolisthesis and disc space loss with circumferential disc osteophyte complex. Broad-based posterior component. Mild-to-moderate spinal stenosis and spinal cord mass effect (series 12, image 12). Severe bilateral C5 foraminal stenosis. C5-C6: Disc space loss with circumferential disc osteophyte complex. Broad-based posterior component with mild posterior element hypertrophy. Mild spinal stenosis. Mild if any spinal cord mass effect. Severe bilateral C6 foraminal stenosis. C6-C7: Disc space loss with circumferential disc bulge. Mostly foraminal and far lateral endplate spurring. Moderate ligament flavum and right greater than left facet hypertrophy. No significant spinal stenosis. Moderate left and severe right C7 foraminal stenosis. C7-T1: Moderate facet hypertrophy. Mild to moderate bilateral C8 foraminal stenosis. No upper thoracic spinal stenosis. IMPRESSION: 1. A no cervical spinal cord demyelination identified, and no definite cord signal abnormality despite multilevel degenerative spinal stenosis (see #2). 2. Widespread cervical spine degeneration. Degenerative spinal stenosis C3-C4 through C5-C6 with maximal cord mass effect at C4-C5 (mild-to-moderate). Severe bilateral C4 through C6 foraminal stenosis. 3. Moderate to severe degenerative neural  foraminal stenosis also at the left C3 and right greater than left C7 nerve levels. Electronically Signed   By: Genevie Ann M.D.   On: 01/22/2019 13:04     Subjective: Patient was feeling better when seen this morning and was eager to go to a facility to get rehab.  He denies any dizziness, chest pain or shortness of breath.  Discharge Exam: Vitals:   01/27/19 0700 01/27/19 0826  BP: (!) 117/51 (!) 127/52  Pulse: (!) 37 68  Resp: 18   Temp: 98.6 F (37 C) (!) 97.5 F (36.4 C)  SpO2: 99% 100%   Vitals:   01/26/19 1541 01/27/19 0002 01/27/19 0700 01/27/19 0826  BP: (!) 114/54 (!) 132/50 (!) 117/51 (!) 127/52  Pulse: (!) 59 (!) 57 (!) 37 68  Resp:  18 18   Temp: 98 F (36.7 C)  98.6 F (37 C) (!) 97.5 F (36.4 C)  TempSrc: Oral  Oral Oral  SpO2: 100% 99% 99% 100%  Weight:      Height:        General: Pt is alert, awake, not in acute distress Cardiovascular: RRR, S1/S2 +, no rubs, no gallops Respiratory: CTA bilaterally, no wheezing, no rhonchi Abdominal: Soft, NT, ND, bowel sounds + Extremities: no edema, no cyanosis   The results of significant diagnostics from this hospitalization (including imaging, microbiology, ancillary and laboratory) are listed below for reference.     Microbiology: Recent Results (from the past 240 hour(s))  SARS CORONAVIRUS 2 (TAT 6-24 HRS) Nasopharyngeal Nasopharyngeal Swab     Status: None   Collection Time: 01/21/19 11:35 PM   Specimen: Nasopharyngeal Swab  Result Value Ref Range Status   SARS Coronavirus 2 NEGATIVE NEGATIVE Final  Comment: (NOTE) SARS-CoV-2 target nucleic acids are NOT DETECTED. The SARS-CoV-2 RNA is generally detectable in upper and lower respiratory specimens during the acute phase of infection. Negative results do not preclude SARS-CoV-2 infection, do not rule out co-infections with other pathogens, and should not be used as the sole basis for treatment or other patient management decisions. Negative results must  be combined with clinical observations, patient history, and epidemiological information. The expected result is Negative. Fact Sheet for Patients: SugarRoll.be Fact Sheet for Healthcare Providers: https://www.woods-mathews.com/ This test is not yet approved or cleared by the [place] FDA and  has been authorized for detection and/or diagnosis of SARS-CoV-2 by FDA under an Emergency Use Authorization (EUA). This EUA will remain  in effect (meaning this test can be used) for the duration of the COVID-19 declaration under Section 56 4(b)(1) of the Act, 21 U.S.C. section 360bbb-3(b)(1), unless the authorization is terminated or revoked sooner. Performed at [facility], [place] [address], [place], [place] [address]      Labs: BNP (last 3 results) No results for input(s): BNP in the last 8760 hours. Basic Metabolic Panel: Recent Labs  Lab 01/21/19 2104 01/22/19 0704  NA 136 137  K 4.7 3.1*  CL 100 102  CO2 25 24  GLUCOSE 120* 91  BUN 21 17  CREATININE 1.45* 1.25*  CALCIUM 9.3 8.6*   Liver Function Tests: Recent Labs  Lab 01/21/19 2104  AST 28  ALT 14  ALKPHOS 108  BILITOT 2.9*  PROT 7.8  ALBUMIN 4.7   No results for input(s): LIPASE, AMYLASE in the last 168 hours. No results for input(s): AMMONIA in the last 168 hours. CBC: Recent Labs  Lab 01/21/19 2104 01/22/19 0818  WBC 10.9* 6.8  NEUTROABS 8.7*  --   HGB 10.4* 9.9*  HCT 30.6* 29.0*  MCV 97.5 90.9  PLT 343 327   Cardiac Enzymes: Recent Labs  Lab 01/21/19 2104  CKTOTAL 403*   BNP: Invalid input(s): POCBNP CBG: No results for input(s): GLUCAP in the last 168 hours. D-Dimer No results for input(s): DDIMER in the last 72 hours. Hgb A1c No results for input(s): HGBA1C in the last 72 hours. Lipid Profile No results for input(s): CHOL, HDL, LDLCALC, TRIG, CHOLHDL, LDLDIRECT in the last 72 hours. Thyroid function studies No results for input(s): TSH,  T4TOTAL, T3FREE, THYROIDAB in the last 72 hours.  Invalid input(s): FREET3 Anemia work up No results for input(s): VITAMINB12, FOLATE, FERRITIN, TIBC, IRON, RETICCTPCT in the last 72 hours. Urinalysis    Component Value Date/Time   COLORURINE YELLOW (A) 01/21/2019 2151   APPEARANCEUR CLEAR (A) 01/21/2019 2151   APPEARANCEUR Cloudy 10/09/2011 1508   LABSPEC 1.015 01/21/2019 2151   LABSPEC 1.016 10/09/2011 1508   PHURINE 6.0 01/21/2019 2151   GLUCOSEU NEGATIVE 01/21/2019 2151   GLUCOSEU Negative 10/09/2011 1508   HGBUR NEGATIVE 01/21/2019 2151   BILIRUBINUR NEGATIVE 01/21/2019 2151   BILIRUBINUR Negative 10/09/2011 [place] 01/21/2019 2151   PROTEINUR NEGATIVE 01/21/2019 2151   NITRITE NEGATIVE 01/21/2019 2151   LEUKOCYTESUR NEGATIVE 01/21/2019 2151   LEUKOCYTESUR Negative 10/09/2011 1508   Sepsis Labs Invalid input(s): PROCALCITONIN,  WBC,  LACTICIDVEN Microbiology Recent Results (from the past 240 hour(s))  SARS CORONAVIRUS 2 (TAT 6-24 HRS) Nasopharyngeal Nasopharyngeal Swab     Status: None   Collection Time: 01/21/19 11:35 PM   Specimen: Nasopharyngeal Swab  Result Value Ref Range Status   SARS Coronavirus 2 NEGATIVE NEGATIVE Final  Comment: (NOTE) SARS-CoV-2 target nucleic acids are NOT DETECTED. The SARS-CoV-2 RNA is generally detectable in upper and lower respiratory specimens during the acute phase of infection. Negative results do not preclude SARS-CoV-2 infection, do not rule out co-infections with other pathogens, and should not be used as the sole basis for treatment or other patient management decisions. Negative results must be combined with clinical observations, patient history, and epidemiological information. The expected result is Negative. Fact Sheet for Patients: SugarRoll.be Fact Sheet for Healthcare Providers: https://www.woods-mathews.com/ This test is not yet approved or cleared by the  [place] FDA and  has been authorized for detection and/or diagnosis of SARS-CoV-2 by FDA under an Emergency Use Authorization (EUA). This EUA will remain  in effect (meaning this test can be used) for the duration of the COVID-19 declaration under Section 56 4(b)(1) of the Act, 21 U.S.C. section 360bbb-3(b)(1), unless the authorization is terminated or revoked sooner. Performed at [facility], [place] [address] [place], [place] [address]     Time coordinating discharge: Over 30 minutes  SIGNED:  Lorella Nimrod, MD  [facility] 01/27/2019, 11:41 AM Pager [phone]  If 7PM-7AM, please contact night-coverage [email or web address] Password [email or web address]

## 2019-01-27 NOTE — Progress Notes (Signed)
Report called to [facility] and EMS called.  Patient is on the list for transport today.  Will continue to monitor.

## 2019-01-27 NOTE — TOC Transition Note (Signed)
Transition of Care [facility]) - CM/SW Discharge Note   Patient Details  Name: [name] MRN: [id number] Date of Birth: September 03, 1950  [facility] CM/SW Contact:  Su Hilt, RN Phone Number: 01/27/2019, 2:02 PM   Clinical Narrative:     Patient to go to [facility] room 506 I spoke to the [employer] who is the [facility] and notified him that the patient will DC today, he is going to go ahead and take the patient's things to [facility] The bedside nurse to call report and to call EMS for transport once ready. DC packet on the chart  Final next level of care: Cataract Barriers to Discharge: Barriers Resolved   Patient Goals and CMS Choice Patient states their goals for this hospitalization and ongoing recovery are:: To go to rehab. CMS Medicare.gov Compare Post Acute Care list provided to:: Patient Choice offered to / list presented to : Patient  Discharge Placement                       Discharge Plan and Services In-house Referral: Clinical Social Work   Post Acute Care Choice: [place]          DME Arranged: N/A         HH Arranged: NA          Social Determinants of Health (SDOH) Interventions     Readmission Risk Interventions No flowsheet data found.

## 2019-01-28 DIAGNOSIS — R296 Repeated falls: Secondary | ICD-10-CM | POA: Diagnosis not present

## 2019-01-28 DIAGNOSIS — G35 Multiple sclerosis: Secondary | ICD-10-CM | POA: Diagnosis not present

## 2019-02-11 DIAGNOSIS — R296 Repeated falls: Secondary | ICD-10-CM | POA: Diagnosis not present

## 2019-02-11 DIAGNOSIS — M21372 Foot drop, left foot: Secondary | ICD-10-CM | POA: Diagnosis not present

## 2019-02-11 DIAGNOSIS — G35 Multiple sclerosis: Secondary | ICD-10-CM | POA: Diagnosis not present

## 2019-02-11 DIAGNOSIS — M21371 Foot drop, right foot: Secondary | ICD-10-CM | POA: Diagnosis not present

## 2019-02-16 ENCOUNTER — Telehealth: Payer: Self-pay

## 2019-02-16 NOTE — Telephone Encounter (Signed)
Telephone call to patient's cousin [name] who is patients HCPOA.  RN following up to see if patient has been discharged from rehab.  [name] states patients 20 days at rehab will be up tomorrow.  [name] states family wants to keep patient at home for as long as possible.  [name] requests call back 02-23-19 after Thanksgiving holiday to schedule time for palliative care team to see patient.  Family did not want palliative care for patient while patient was in rehab.

## 2019-02-23 ENCOUNTER — Telehealth: Payer: Self-pay

## 2019-02-23 NOTE — Telephone Encounter (Signed)
Return telephone call from [place] to schedule palliative care visit with patient. Patient/family in agreement with home visit on 03-03-19 at 11:00 AM.

## 2019-02-25 ENCOUNTER — Telehealth: Payer: Self-pay

## 2019-02-26 NOTE — Telephone Encounter (Signed)
Patient has returned home from SNF. Telephone call to patient/family to schedule palliative care visit with patient. Patient/family in agreement with home visit to be moved to Monday, 03-02-19 at 11:00 AM.

## 2019-02-27 ENCOUNTER — Telehealth: Payer: Self-pay | Admitting: *Deleted

## 2019-02-27 NOTE — Telephone Encounter (Signed)
Philip Gordon just wanted to clarify and make sure he should be taking potassium and amlodipine. Advised per discharge summary, he should be on both. He has been taking both daily.

## 2019-02-27 NOTE — Telephone Encounter (Signed)
Copied from [place] [phone]. Topic: General - Other >> Feb 27, 2019 10:47 AM Greggory Keen D wrote: Reason for CRM: Spero Curb care giver for pt called asking about medications given to him from [facility].      Best call back [phone]

## 2019-03-02 ENCOUNTER — Other Ambulatory Visit: Payer: Medicare Other

## 2019-03-02 ENCOUNTER — Other Ambulatory Visit: Payer: Self-pay

## 2019-03-02 DIAGNOSIS — Z515 Encounter for palliative care: Secondary | ICD-10-CM

## 2019-03-02 NOTE — Progress Notes (Signed)
PATIENT NAME: Philip Gordon DOB: 1950-12-26 MRN: [id number]  PRIMARY CARE PROVIDER: Einar Pheasant, MD  RESPONSIBLE PARTY:  Acct ID - Guarantor Home Phone Work Phone Relationship Acct Type  [id number] Philip [phone]  Self P/F     [address], [address], [place], [place] [address]    PLAN OF CARE and INTERVENTIONS:               1.  GOALS OF CARE/ ADVANCE CARE PLANNING:  Remain at home with caregivers coming to the home 3-4 hours per day.  Patient wants to get stronger.               2.  PATIENT/CAREGIVER EDUCATION:  Education on fall precautions, reviewed meds, education on palliative care services, support               3.  DISEASE STATUS: SW and RN made scheduled home care visit today. Patient in bathroom being assisted by hired caregiver Philip Gordon. Philip Gordon or other hired caregiver is in the home with patient 6 x days a week for 3 - 4 hours per day. Patients cousin Philip Gordon arrived during visit. Philip Gordon is patients cousin and health care power-of-attorney. Patient was discharged from rehab last Wednesday. Per caregiver and Philip Gordon patient has declined significantly since being at rehab. Home health is scheduled to make home visit tomorrow. Patient has multiple sclerosis, hypertension, hypercholesterolemia, weakness, acute  kidney injury and falls. Patient denies having pain at the present time. Patient reports he does have some pain that feels like pins and needles in his right shoulder at times. Patient feels like taking Gabapentin caused him to suffer falls prior to going to rehab. Patient has skin breakdown on sacral area and Philip Gordon reports she has been applying ointment to area. Patient is 5'9 and current weight 145 lbs. Patient reports he is eating better since returning home. Patient wears braces on bilateral legs and is able to walk short distances with walker. Patient did not ambulate with rollator walker while palliative team was in the home. Patient was brought from bathroom to  living room in wheelchair. Patient's heart rate is low currently 40.  Per notes in Epic chart patients heart rate ranges in the 40s. Patient breath sounds are diminished throughout, patient denies having shortness of breath or cough. Patient takes Mirtazapine and Zanaflex at bedtime. Patient talks at length about many friends he has in community. Patient has a DNR in the home. Patients goal is to get stronger and remain in his apartment for as long as possible. Palliative care services explained and patient and Philip Gordon both in agreement. Nurse review patient's medications with patient and Philip Gordon. Patient, Philip Gordon and Philip Gordon all encouraged to contact palliative care with questions or concerns.     HISTORY OF PRESENT ILLNESS:  Patient is a 68 year old male who resides in his apartment with caregivers coming to home.  Patient will be seen monthly and PRN by palliative care team.   CODE STATUS: DNR  ADVANCED DIRECTIVES: Y MOST FORM: No PPS: 50%   PHYSICAL EXAM:   VITALS: Today's Vitals   03/02/19 1137  BP: 92/60  Pulse: (!) 40  Resp: 16  Temp: 98.3 F (36.8 C)  TempSrc: Temporal  SpO2: 99%  Weight: 145 lb (65.8 kg)  Height: 5\' 9"  (1.753 m)  PainSc: 0-No pain    LUNGS: decreased breath sounds CARDIAC: Cor Brady  EXTREMITIES: negative, none edema SKIN: skin breakdown on sacral rea, healing boil on back  of right back, area is scabbed over.  NEURO: positive for gait problems, memory problems and weakness       Philip Simmer, RN

## 2019-03-02 NOTE — Progress Notes (Signed)
COMMUNITY PALLIATIVE CARE SW NOTE  PATIENT NAME: Philip Gordon DOB: 30-Dec-1950 MRN: [id number]  PRIMARY CARE PROVIDER: Einar Pheasant, MD  RESPONSIBLE PARTY:  Acct ID - Guarantor Home Phone Work Phone Relationship Acct Type  [id number] Doristine [phone]  Self P/F     [address], [address], [place], [place] [address]     PLAN OF CARE and INTERVENTIONS:             1. GOALS OF CARE/ ADVANCE CARE PLANNING:  Patient is DNR, form is in the home. HCPOA is [name], patient's cousin. Goal is to get stronger and move around more.  2. SOCIAL/EMOTIONAL/SPIRITUAL ASSESSMENT/ INTERVENTIONS:  SW and RN met with patient in the home. [name], private caregiver, was present in the home. Patient returned home from [facility] on Wednesday. Patient denies pain at this time. Patient acknowledged that he does have pain and tingling in his upper back at times. Patient's appetite is good. Patient said he is resting well. Patient talked about time living in [place] and all the local friends that he has. Patient has one adult daughter in [place], and a two year old grandson and a 55 year old grandson. Patient's cousin, [name] joined towards the end of the visit to also provide update. [name] said patient has declined the past few months and concerns surround patient's need for additional support in the home.  3. PATIENT/CAREGIVER EDUCATION/ COPING:  Patient is alert, oriented but forgetful at times. Patient enjoys conversation and is pleasant. Patient is coping well, openly expressed feelings. Family and friends are supportive. 4. PERSONAL EMERGENCY PLAN:  Family/caregiver will call 9-1-1 for emergencies. 5. COMMUNITY RESOURCES COORDINATION/ HEALTH CARE NAVIGATION:  Patient is going to start home health this week, unsure of the agency. [name] believes that they are coming tomorrow. Patient has a private caregiver with him 3-4 hours per day for six days a week.  6. FINANCIAL/LEGAL  CONCERNS/INTERVENTIONS:  None.     SOCIAL HX:  Social History   Tobacco Use  . Smoking status: Never Smoker  . Smokeless tobacco: Current User    Types: Chew  . Tobacco comment: Is going to try and cut down on the amount.   Substance Use Topics  . Alcohol use: No    Alcohol/week: 2.0 standard drinks    Types: 2 Cans of beer per week    CODE STATUS:   Code Status: Prior (DNR) ADVANCED DIRECTIVES: Y- requested copy. MOST FORM COMPLETE:  No. HOSPICE EDUCATION PROVIDED: None.  PPS: Patient is weaker, ambulating very short distance with the walker. Patient uses the wheelchair to go from bedroom to chair in the living room. Patient is able to feed himself. Patient needs assistance with bathing and tolieting.   I spent 60 minutes with patient/family, from 11:00a-12:00p providing education, support and consultation.   Margaretmary Lombard, LCSW

## 2019-03-03 ENCOUNTER — Other Ambulatory Visit: Payer: Self-pay

## 2019-03-06 DIAGNOSIS — F329 Major depressive disorder, single episode, unspecified: Secondary | ICD-10-CM | POA: Diagnosis not present

## 2019-03-06 DIAGNOSIS — Z9181 History of falling: Secondary | ICD-10-CM | POA: Diagnosis not present

## 2019-03-06 DIAGNOSIS — G8929 Other chronic pain: Secondary | ICD-10-CM | POA: Diagnosis not present

## 2019-03-06 DIAGNOSIS — M21371 Foot drop, right foot: Secondary | ICD-10-CM | POA: Diagnosis not present

## 2019-03-06 DIAGNOSIS — M21372 Foot drop, left foot: Secondary | ICD-10-CM | POA: Diagnosis not present

## 2019-03-06 DIAGNOSIS — M549 Dorsalgia, unspecified: Secondary | ICD-10-CM | POA: Diagnosis not present

## 2019-03-06 DIAGNOSIS — I129 Hypertensive chronic kidney disease with stage 1 through stage 4 chronic kidney disease, or unspecified chronic kidney disease: Secondary | ICD-10-CM | POA: Diagnosis not present

## 2019-03-06 DIAGNOSIS — E7800 Pure hypercholesterolemia, unspecified: Secondary | ICD-10-CM | POA: Diagnosis not present

## 2019-03-06 DIAGNOSIS — F17220 Nicotine dependence, chewing tobacco, uncomplicated: Secondary | ICD-10-CM | POA: Diagnosis not present

## 2019-03-06 DIAGNOSIS — N183 Chronic kidney disease, stage 3 unspecified: Secondary | ICD-10-CM | POA: Diagnosis not present

## 2019-03-06 DIAGNOSIS — Z85841 Personal history of malignant neoplasm of brain: Secondary | ICD-10-CM | POA: Diagnosis not present

## 2019-03-06 DIAGNOSIS — Z8546 Personal history of malignant neoplasm of prostate: Secondary | ICD-10-CM | POA: Diagnosis not present

## 2019-03-06 DIAGNOSIS — N319 Neuromuscular dysfunction of bladder, unspecified: Secondary | ICD-10-CM | POA: Diagnosis not present

## 2019-03-06 DIAGNOSIS — I10 Essential (primary) hypertension: Secondary | ICD-10-CM | POA: Diagnosis not present

## 2019-03-06 DIAGNOSIS — N400 Enlarged prostate without lower urinary tract symptoms: Secondary | ICD-10-CM | POA: Diagnosis not present

## 2019-03-06 DIAGNOSIS — G35 Multiple sclerosis: Secondary | ICD-10-CM | POA: Diagnosis not present

## 2019-03-09 DIAGNOSIS — M21372 Foot drop, left foot: Secondary | ICD-10-CM | POA: Diagnosis not present

## 2019-03-09 DIAGNOSIS — G8929 Other chronic pain: Secondary | ICD-10-CM | POA: Diagnosis not present

## 2019-03-09 DIAGNOSIS — M21371 Foot drop, right foot: Secondary | ICD-10-CM | POA: Diagnosis not present

## 2019-03-09 DIAGNOSIS — G35 Multiple sclerosis: Secondary | ICD-10-CM | POA: Diagnosis not present

## 2019-03-09 DIAGNOSIS — I129 Hypertensive chronic kidney disease with stage 1 through stage 4 chronic kidney disease, or unspecified chronic kidney disease: Secondary | ICD-10-CM | POA: Diagnosis not present

## 2019-03-09 DIAGNOSIS — N183 Chronic kidney disease, stage 3 unspecified: Secondary | ICD-10-CM | POA: Diagnosis not present

## 2019-03-10 DIAGNOSIS — G8929 Other chronic pain: Secondary | ICD-10-CM | POA: Diagnosis not present

## 2019-03-10 DIAGNOSIS — N183 Chronic kidney disease, stage 3 unspecified: Secondary | ICD-10-CM | POA: Diagnosis not present

## 2019-03-10 DIAGNOSIS — I129 Hypertensive chronic kidney disease with stage 1 through stage 4 chronic kidney disease, or unspecified chronic kidney disease: Secondary | ICD-10-CM | POA: Diagnosis not present

## 2019-03-10 DIAGNOSIS — G35 Multiple sclerosis: Secondary | ICD-10-CM | POA: Diagnosis not present

## 2019-03-10 DIAGNOSIS — M21371 Foot drop, right foot: Secondary | ICD-10-CM | POA: Diagnosis not present

## 2019-03-10 DIAGNOSIS — M21372 Foot drop, left foot: Secondary | ICD-10-CM | POA: Diagnosis not present

## 2019-03-11 DIAGNOSIS — G8929 Other chronic pain: Secondary | ICD-10-CM | POA: Diagnosis not present

## 2019-03-11 DIAGNOSIS — M21372 Foot drop, left foot: Secondary | ICD-10-CM | POA: Diagnosis not present

## 2019-03-11 DIAGNOSIS — I129 Hypertensive chronic kidney disease with stage 1 through stage 4 chronic kidney disease, or unspecified chronic kidney disease: Secondary | ICD-10-CM | POA: Diagnosis not present

## 2019-03-11 DIAGNOSIS — G35 Multiple sclerosis: Secondary | ICD-10-CM | POA: Diagnosis not present

## 2019-03-11 DIAGNOSIS — N183 Chronic kidney disease, stage 3 unspecified: Secondary | ICD-10-CM | POA: Diagnosis not present

## 2019-03-11 DIAGNOSIS — M21371 Foot drop, right foot: Secondary | ICD-10-CM | POA: Diagnosis not present

## 2019-03-12 NOTE — Telephone Encounter (Signed)
Enid Derry called about the home healthcare nurse states pt does not need to be taking the amLODipine (NORVASC) 5 MG tablet because of pulse rate was low yesterday 41 per PT also stop taking the hydrochlorothiazide (MICROZIDE) 12.5 MG capsule.   Pt needs a Rx for potassium.  Please advise and Thank you!  Pharmacy is [facility], [place]  Call Hannah @ [phone].

## 2019-03-12 NOTE — Telephone Encounter (Signed)
Called Philip Gordon to confirm who advised him to stop his medications. Enid Derry stated that she is not home right now. She had some notes written down. Advised I would follow up with her tomorrow.

## 2019-03-13 ENCOUNTER — Other Ambulatory Visit: Payer: Self-pay

## 2019-03-13 ENCOUNTER — Emergency Department: Payer: Medicare Other

## 2019-03-13 ENCOUNTER — Ambulatory Visit
Admission: EM | Admit: 2019-03-13 | Discharge: 2019-03-13 | Disposition: A | Discharge status: Home / Self Care | Payer: Medicare Other | Attending: Nurse Practitioner | Admitting: Nurse Practitioner

## 2019-03-13 ENCOUNTER — Encounter: Payer: Self-pay | Admitting: *Deleted

## 2019-03-13 ENCOUNTER — Emergency Department
Admission: EM | Admit: 2019-03-13 | Discharge: 2019-03-13 | Disposition: A | Discharge status: Home / Self Care | Payer: Medicare Other | Attending: Emergency Medicine | Admitting: Emergency Medicine

## 2019-03-13 ENCOUNTER — Telehealth: Payer: Self-pay | Admitting: Internal Medicine

## 2019-03-13 DIAGNOSIS — G8929 Other chronic pain: Secondary | ICD-10-CM | POA: Diagnosis not present

## 2019-03-13 DIAGNOSIS — I10 Essential (primary) hypertension: Secondary | ICD-10-CM | POA: Diagnosis not present

## 2019-03-13 DIAGNOSIS — M21372 Foot drop, left foot: Secondary | ICD-10-CM | POA: Diagnosis not present

## 2019-03-13 DIAGNOSIS — I129 Hypertensive chronic kidney disease with stage 1 through stage 4 chronic kidney disease, or unspecified chronic kidney disease: Secondary | ICD-10-CM | POA: Diagnosis not present

## 2019-03-13 DIAGNOSIS — I493 Ventricular premature depolarization: Secondary | ICD-10-CM

## 2019-03-13 DIAGNOSIS — G35 Multiple sclerosis: Secondary | ICD-10-CM | POA: Diagnosis not present

## 2019-03-13 DIAGNOSIS — F17220 Nicotine dependence, chewing tobacco, uncomplicated: Secondary | ICD-10-CM | POA: Insufficient documentation

## 2019-03-13 DIAGNOSIS — M21371 Foot drop, right foot: Secondary | ICD-10-CM | POA: Diagnosis not present

## 2019-03-13 DIAGNOSIS — I959 Hypotension, unspecified: Secondary | ICD-10-CM | POA: Diagnosis not present

## 2019-03-13 DIAGNOSIS — N183 Chronic kidney disease, stage 3 unspecified: Secondary | ICD-10-CM | POA: Diagnosis not present

## 2019-03-13 DIAGNOSIS — R001 Bradycardia, unspecified: Secondary | ICD-10-CM | POA: Diagnosis present

## 2019-03-13 LAB — CBC
HCT: 26.3 % — ABNORMAL LOW (ref 39.0–52.0)
Hemoglobin: 9.2 g/dL — ABNORMAL LOW (ref 13.0–17.0)
MCH: 33.6 pg (ref 26.0–34.0)
MCHC: 35 g/dL (ref 30.0–36.0)
MCV: 96 fL (ref 80.0–100.0)
Platelets: 409 10*3/uL — ABNORMAL HIGH (ref 150–400)
RBC: 2.74 MIL/uL — ABNORMAL LOW (ref 4.22–5.81)
RDW: 16.4 % — ABNORMAL HIGH (ref 11.5–15.5)
WBC: 9 10*3/uL (ref 4.0–10.5)
nRBC: 0 % (ref 0.0–0.2)

## 2019-03-13 LAB — BASIC METABOLIC PANEL
Anion gap: 10 (ref 5–15)
BUN: 17 mg/dL (ref 8–23)
CO2: 20 mmol/L — ABNORMAL LOW (ref 22–32)
Calcium: 8.6 mg/dL — ABNORMAL LOW (ref 8.9–10.3)
Chloride: 108 mmol/L (ref 98–111)
Creatinine, Ser: 1.08 mg/dL (ref 0.61–1.24)
GFR calc Af Amer: >60 mL/min (ref >60)
GFR calc non Af Amer: >60 mL/min (ref >60)
Glucose, Bld: 88 mg/dL (ref 70–99)
Potassium: 4.5 mmol/L (ref 3.5–5.1)
Sodium: 138 mmol/L (ref 135–145)

## 2019-03-13 NOTE — Telephone Encounter (Signed)
Thank you.  Let me know if I need to do anything more.  Agree with evaluation is best.

## 2019-03-13 NOTE — ED Notes (Signed)
Patient transported via EMS to [facility] ED

## 2019-03-13 NOTE — ED Notes (Signed)
ED Provider [place] at bedside. 

## 2019-03-13 NOTE — Telephone Encounter (Signed)
PT called concerned Sela Hua with [facility] called with patient pulse rate concerned  Down to 37, BP 120/68 temp 98.9,  resp 16, 02 sats 97%. Patient stated he feels well denies any other symptom at this time. Had PT to assist patient o walk to see if pulse would come up pulse increased with walking to 67 and then when he set down went up 87. PT say pulse felt arrhythmic.

## 2019-03-13 NOTE — Telephone Encounter (Signed)
Pt going to hospital

## 2019-03-13 NOTE — Telephone Encounter (Signed)
Patient Going to [facility] across from office to be evaluated, PT ws able to attain a manual pulse of 42, when question Caretaker Enid Derry) again to advised to hold amlodipine she stated she did not restart and [facility] stopped the HCTZ also.

## 2019-03-13 NOTE — ED Notes (Signed)
Pt denies CP/SHOB at this time.  

## 2019-03-13 NOTE — ED Provider Notes (Signed)
[facility] Emergency Department Provider Note       Time seen: ----------------------------------------- 6:56 PM on 03/13/2019 -----------------------------------------   I have reviewed the triage vital signs and the nursing notes.  HISTORY   Chief Complaint Irregular Heart Beat    HPI Philip Gordon is a 68 y.o. male with a history of depression, hyperlipidemia, hypertension, MS who presents to the ED for bradycardia.  Patient was seen by his home health nurse and physical therapy found to be bradycardic.  He was taken to [facility] urgent care by family member was sent to the ER for irregular heartbeat.  EKG shows heart rate in the 60s with PVCs.  He denies any complaints, is not sure why he is here.  Past Medical History:  Diagnosis Date  . Depression   . Hypercholesterolemia   . Hypertension   . Multiple sclerosis (HCC)    Sees Dr Ala Bent [facility])  . Prostate cancer [facility])    Followed by Dr [name] and Dr [name]    Patient Active Problem List   Diagnosis Date Noted  . Fall 01/23/2019  . Weakness 01/21/2019  . AKI (acute kidney injury) ([place]) 01/21/2019  . 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 Bilateral foot-drop 02/04/2015  . Sinus infection 11/15/2014  . Right mandibular swelling 11/15/2014  . Brain lesion 01/17/2014  . Obstruction of urinary tract 10/15/2012  . Bladder outlet obstruction 10/15/2012  . Disorder of bladder function 01/16/2012  . Incomplete bladder emptying 01/16/2012  . History of prostate cancer 01/16/2012  . DS (disseminated sclerosis) ([place]) 01/16/2012  . Malignant neoplasm of prostate ([place]) 01/16/2012  . Hypertension 12/28/2011  . Hypercholesteremia 12/28/2011  . Multiple sclerosis ([place]) 12/28/2011  . B12 deficiency 12/28/2011  . Acquired ankle/foot deformity 12/07/2010    Past Surgical History:  Procedure Laterality Date  . KNEE SURGERY      Allergies Patient has  no known allergies.  Social History Social History   Tobacco Use  . Smoking status: Never Smoker  . Smokeless tobacco: Current User    Types: Chew  . Tobacco comment: Is going to try and cut down on the amount.   Substance Use Topics  . Alcohol use: No    Alcohol/week: 2.0 standard drinks    Types: 2 Cans of beer per week  . Drug use: No   Review of Systems Constitutional: Negative for fever. Cardiovascular: Negative for chest pain. Respiratory: Negative for shortness of breath. Gastrointestinal: Negative for abdominal pain, vomiting and diarrhea. Musculoskeletal: Negative for back pain. Skin: Negative for rash. Neurological: Negative for headaches, negative for weakness  All systems negative/normal/unremarkable except as stated in the HPI  ____________________________________________   PHYSICAL EXAM:  VITAL SIGNS: ED Triage Vitals  Enc Vitals Group     BP 03/13/19 1505 (!) 136/98     Pulse Rate 03/13/19 1505 66     Resp 03/13/19 1505 16     Temp 03/13/19 1505 98.6 F (37 C)     Temp Source 03/13/19 1505 Oral     SpO2 03/13/19 1505 100 %     Weight 03/13/19 1506 145 lb (65.8 kg)     Height 03/13/19 1506 5\' 9"  (1.753 m)     Head Circumference --      Peak Flow --      Pain Score 03/13/19 1506 0     Pain Loc --      Pain Edu? --      Excl.  in [place]? --    Constitutional: Alert and oriented. Well appearing and in no distress. Eyes: Conjunctivae are normal. Normal extraocular movements. ENT      Head: Normocephalic and atraumatic.      Nose: No congestion/rhinnorhea.      Mouth/Throat: Mucous membranes are moist.      Neck: No stridor. Cardiovascular: Normal rate, irregular rhythm. No murmurs, rubs, or gallops. Respiratory: Normal respiratory effort without tachypnea nor retractions. Breath sounds are clear and equal bilaterally. No wheezes/rales/rhonchi. Gastrointestinal: Soft and nontender. Normal bowel sounds Musculoskeletal: Nontender with normal range of  motion in extremities. No lower extremity tenderness nor edema. Neurologic:  Normal speech and language. No gross focal neurologic deficits are appreciated.  Skin:  Skin is warm, dry and intact. No rash noted. Psychiatric: Mood and affect are normal. Speech and behavior are normal.  ____________________________________________  EKG: Interpreted by me.  Sinus rhythm with rate of 64 bpm, frequent PVCs, incomplete right bundle branch block, normal QT  ____________________________________________  ED COURSE:  As part of my medical decision making, I reviewed the following data within the [place] History obtained from family if available, nursing notes, old chart and ekg, as well as notes from prior ED visits. Patient presented for possible arrhythmia or bradycardia, we will assess with labs and imaging as indicated at this time.   Procedures  Philip Gordon was evaluated in Emergency Department on 03/13/2019 for the symptoms described in the history of present illness. He was evaluated in the context of the global COVID-19 pandemic, which necessitated consideration that the patient might be at risk for infection with the SARS-CoV-2 virus that causes COVID-19. Institutional protocols and algorithms that pertain to the evaluation of patients at risk for COVID-19 are in a state of rapid change based on information released by regulatory bodies including the CDC and federal and state organizations. These policies and algorithms were followed during the patient's care in the ED.  ____________________________________________   LABS (pertinent positives/negatives)  Labs Reviewed  BASIC METABOLIC PANEL - Abnormal; Notable for the following components:      Result Value   CO2 20 (*)    Calcium 8.6 (*)    All other components within normal limits  CBC - Abnormal; Notable for the following components:   RBC 2.74 (*)    Hemoglobin 9.2 (*)    HCT 26.3 (*)    RDW 16.4 (*)     Platelets 409 (*)    All other components within normal limits  ____________________________________________   DIFFERENTIAL DIAGNOSIS   Arrhythmia, medication side effect, dehydration, electrolyte abnormality, renal failure  FINAL ASSESSMENT AND PLAN  Bradycardia   Plan: The patient had presented for irregular heartbeat. Patient's labs revealed a slightly lower CBC compared to a year ago but otherwise no acute findings.  Patient denies any complaints, I do not see any indication for emergent intervention.  He will be referred to cardiology for outpatient follow-up.   Laurence Aly, MD    Note: This note was generated in part or whole with voice recognition software. Voice recognition is usually quite accurate but there are transcription errors that can and very often do occur. I apologize for any typographical errors that were not detected and corrected.     Earleen Newport, MD 03/13/19 Darlin Drop

## 2019-03-13 NOTE — ED Provider Notes (Signed)
Philip Gordon    CSN: [id number] Arrival date & time: 03/13/19  1306      History   Chief Complaint Chief Complaint  Patient presents with  . Pulse rate concern    HPI Philip Gordon is a 68 y.o. male.   Subjective:   Philip Gordon is a 68 y.o. male referred by his PCP for evaluation of low heart rate. Patient has multiple chronic medical problems. He currently lives at home alone and receives palliative care/PT/home health services. Reportedly, the patient was noted to have a heart rate in the 40s while undergoing physical therapy earlier today and it was thought to be "arrythmic."  In review of patient's medical records, he has had low heart rates and low blood pressure for the past several weeks.  His hydrochlorothiazide and Norvasc has recently been discontinued.  Patient denies any symptoms during the time that his heart rate was noted to be low and currently.  He says that he has occasional tingling sensation of the extremities but that is "normal."   Previous Report Reviewed: historical medical records   The following portions of the patient's history were reviewed and updated as appropriate: allergies, current medications, past family history, past medical history, past social history, past surgical history and problem list.       Past Medical History:  Diagnosis Date  . Depression   . Hypercholesterolemia   . Hypertension   . Multiple sclerosis (HCC)    Sees Dr Ala Bent [facility])  . Prostate cancer [facility])    Followed by Dr [name] and Dr [name]    Patient Active Problem List   Diagnosis Date Noted  . Fall 01/23/2019  . Weakness 01/21/2019  . AKI (acute kidney injury) ([place]) 01/21/2019  . 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 Bilateral foot-drop 02/04/2015  . Sinus infection 11/15/2014  . Right mandibular swelling 11/15/2014  . Brain lesion 01/17/2014  . Obstruction of urinary tract 10/15/2012  .  Bladder outlet obstruction 10/15/2012  . Disorder of bladder function 01/16/2012  . Incomplete bladder emptying 01/16/2012  . History of prostate cancer 01/16/2012  . DS (disseminated sclerosis) ([place]) 01/16/2012  . Malignant neoplasm of prostate ([place]) 01/16/2012  . Hypertension 12/28/2011  . Hypercholesteremia 12/28/2011  . Multiple sclerosis ([place]) 12/28/2011  . B12 deficiency 12/28/2011  . Acquired ankle/foot deformity 12/07/2010    Past Surgical History:  Procedure Laterality Date  . KNEE SURGERY         Home Medications    Prior to Admission medications   Medication Sig Start Date End Date Taking? Authorizing Provider  amLODipine (NORVASC) 5 MG tablet Take 1 tablet (5 mg total) by mouth daily. 10/14/18   Einar Pheasant, MD  diphenhydrAMINE (BENADRYL) 25 mg capsule Take 25 mg by mouth at bedtime as needed.     [provider]  donepezil (ARICEPT) 10 MG tablet Take 10 mg by mouth at bedtime.  04/10/15   [provider]  ezetimibe (ZETIA) 10 MG tablet Take 1 tablet (10 mg total) by mouth daily. 10/14/18   Einar Pheasant, MD  finasteride (PROSCAR) 5 MG tablet Take 1 tablet (5 mg total) by mouth daily. 10/14/18   Einar Pheasant, MD  fluticasone (FLONASE) 50 MCG/ACT nasal spray Place 2 sprays into both nostrils daily. 03/14/17   Einar Pheasant, MD  hydrochlorothiazide (MICROZIDE) 12.5 MG capsule TAKE 1 CAPSULE BY MOUTH DAILY. 10/14/18   Einar Pheasant, MD  mirtazapine (REMERON) 15 MG tablet  TAKE 1/2 TABLET BY MOUTH AT BEDTIME 02/07/15   Forest Gleason, MD  POTASSIUM PO Take 99 mg by mouth daily.    [provider]  sertraline (ZOLOFT) 25 MG tablet Take 1 tablet (25 mg total) by mouth daily. 10/14/18   Einar Pheasant, MD  tiZANidine (ZANAFLEX) 4 MG tablet TAKE 1 TABLET BY MOUTH NIGHTLY 12/10/14   Forest Gleason, MD  vitamin B-12 (CYANOCOBALAMIN) 1000 MCG tablet Take 1 tablet (1,000 mcg total) by mouth daily. 10/25/15   Einar Pheasant, MD    Family  History Family History  Problem Relation Age of Onset  . Hypertension Mother   . Stroke Father   . Diabetes Maternal Grandmother   . Diabetes Maternal Grandfather   . Leukemia Sister        died - age 54    Social History Social History   Tobacco Use  . Smoking status: Never Smoker  . Smokeless tobacco: Current User    Types: Chew  . Tobacco comment: Is going to try and cut down on the amount.   Substance Use Topics  . Alcohol use: No    Alcohol/week: 2.0 standard drinks    Types: 2 Cans of beer per week  . Drug use: No     Allergies   Patient has no known allergies.   Review of Systems Review of Systems  Constitutional: Negative.   HENT: Negative.   Respiratory: Negative.   Cardiovascular: Negative.   Musculoskeletal: Positive for arthralgias.  Neurological: Negative.   All other systems reviewed and are negative.    Physical Exam Triage Vital Signs ED Triage Vitals  Enc Vitals Group     BP 03/13/19 1318 (!) 93/32     Pulse Rate 03/13/19 1318 62     Resp --      Temp --      Temp src --      SpO2 03/13/19 1318 99 %     Weight 03/13/19 1315 145 lb (65.8 kg)     Height --      Head Circumference --      Peak Flow --      Pain Score 03/13/19 1315 0     Pain Loc --      Pain Edu? --      Excl. in [place]? --    No data found.  Updated Vital Signs BP (!) 93/32   Pulse 62   Wt 145 lb (65.8 kg)   SpO2 99%   BMI 21.41 kg/m   Visual Acuity Right Eye Distance:   Left Eye Distance:   Bilateral Distance:    Right Eye Near:   Left Eye Near:    Bilateral Near:     Physical Exam Vitals reviewed.  Constitutional:      Appearance: Normal appearance. He is ill-appearing.  HENT:     Head: Normocephalic.  Cardiovascular:     Rate and Rhythm: Normal rate.  Pulmonary:     Effort: Pulmonary effort is normal.  Musculoskeletal:     Cervical back: Normal range of motion and neck supple.  Skin:    General: Skin is warm and dry.  Neurological:      General: No focal deficit present.     Mental Status: He is alert. Mental status is at baseline.      UC Treatments / Results  Labs (all labs ordered are listed, but only abnormal results are displayed) Labs Reviewed - No data to display  EKG   Radiology  No results found.  Procedures Procedures (including critical care time)  Medications Ordered in UC Medications - No data to display  Initial Impression / Assessment and Plan / UC Course  I have reviewed the triage vital signs and the nursing notes.  Pertinent labs & imaging results that were available during my care of the patient were reviewed by me and considered in my medical decision making (see chart for details).    68 year old male with multiple chronic medical problems presenting from home with low heart rate.  Patient has a history of low heart rates and low blood pressure for the past several weeks. His hydrochlorothiazide and Norvasc has recently been discontinued due to recent falls and low BP.  Patient denies any complaints at this time.  Patient appears chronically ill.  BP 93/32.  Heart rate at clinic arrival was 63.  EKG with heart rate of 91 and multiple PVCs noted.  Based on patient's history, presentation and abnormal EKG, it is advised that patient have further evaluation at the local emergency department.  Discussed this with the patient and caregiver at bedside who are agreeable to ED transport. Report given to EMS staff.   This care was provided during an unprecedented National Emergency due to the Novel Coronavirus (COVID-19) pandemic. COVID-19 infections and transmission risks place heavy strains on healthcare resources.  As this pandemic evolves, our facility, providers, and staff strive to respond fluidly, to remain operational, and to provide care relative to available resources and information. Outcomes are unpredictable and treatments are without well-defined guidelines. Further, the impact of COVID-19 on  all aspects of urgent care, including the impact to patients seeking care for reasons other than COVID-19, is unavoidable during this national emergency. At this time of the global pandemic, management of patients has significantly changed, even for non-COVID positive patients given high local and regional COVID volumes at this time requiring high healthcare system and resource utilization. The standard of care for management of both COVID suspected and non-COVID suspected patients continues to change rapidly at the local, regional, national, and global levels. This patient was worked up and treated to the best available but ever changing evidence and resources available at this current time.   Documentation was completed with the aid of voice recognition software. Transcription may contain typographical errors. Final Clinical Impressions(s) / UC Diagnoses   Final diagnoses:  None   Discharge Instructions   None    ED Prescriptions    None     PDMP not reviewed this encounter.   Enrique Sack, [place] 03/13/19 [phone]

## 2019-03-13 NOTE — Telephone Encounter (Signed)
If heart rate in the 30s, then needs to be seen.  Will need ekg, etc.  Let me know if a problem.

## 2019-03-13 NOTE — Discharge Instructions (Signed)
You will need further evaluation at the ER. EMS has been called to transport you.

## 2019-03-13 NOTE — ED Triage Notes (Signed)
Pt arrives via ems from [facility], pt was seen in home by physical therapy today who found pt to be bradycardic. Pt was taken to [facility] urgent care by a family member who sent pt to the ER via ems for irreg heart rate, ekg shows heart rate to be in the 60's with pvc's. Pt denies pain, denies weakness and states that he really doesn't understand why he has to come to the ER

## 2019-03-13 NOTE — ED Notes (Signed)
NP at bedside to speak with patient and POA. EKG abnormal with no previous EKG supporting new EKG.

## 2019-03-16 DIAGNOSIS — N183 Chronic kidney disease, stage 3 unspecified: Secondary | ICD-10-CM | POA: Diagnosis not present

## 2019-03-16 DIAGNOSIS — M21372 Foot drop, left foot: Secondary | ICD-10-CM | POA: Diagnosis not present

## 2019-03-16 DIAGNOSIS — M21371 Foot drop, right foot: Secondary | ICD-10-CM | POA: Diagnosis not present

## 2019-03-16 DIAGNOSIS — G35 Multiple sclerosis: Secondary | ICD-10-CM | POA: Diagnosis not present

## 2019-03-16 DIAGNOSIS — G8929 Other chronic pain: Secondary | ICD-10-CM | POA: Diagnosis not present

## 2019-03-16 DIAGNOSIS — I129 Hypertensive chronic kidney disease with stage 1 through stage 4 chronic kidney disease, or unspecified chronic kidney disease: Secondary | ICD-10-CM | POA: Diagnosis not present

## 2019-03-17 DIAGNOSIS — N183 Chronic kidney disease, stage 3 unspecified: Secondary | ICD-10-CM | POA: Diagnosis not present

## 2019-03-17 DIAGNOSIS — I129 Hypertensive chronic kidney disease with stage 1 through stage 4 chronic kidney disease, or unspecified chronic kidney disease: Secondary | ICD-10-CM | POA: Diagnosis not present

## 2019-03-17 DIAGNOSIS — G8929 Other chronic pain: Secondary | ICD-10-CM | POA: Diagnosis not present

## 2019-03-17 DIAGNOSIS — M21371 Foot drop, right foot: Secondary | ICD-10-CM | POA: Diagnosis not present

## 2019-03-17 DIAGNOSIS — M21372 Foot drop, left foot: Secondary | ICD-10-CM | POA: Diagnosis not present

## 2019-03-17 DIAGNOSIS — G35 Multiple sclerosis: Secondary | ICD-10-CM | POA: Diagnosis not present

## 2019-03-19 DIAGNOSIS — N183 Chronic kidney disease, stage 3 unspecified: Secondary | ICD-10-CM | POA: Diagnosis not present

## 2019-03-19 DIAGNOSIS — G35 Multiple sclerosis: Secondary | ICD-10-CM | POA: Diagnosis not present

## 2019-03-19 DIAGNOSIS — M21371 Foot drop, right foot: Secondary | ICD-10-CM | POA: Diagnosis not present

## 2019-03-19 DIAGNOSIS — I129 Hypertensive chronic kidney disease with stage 1 through stage 4 chronic kidney disease, or unspecified chronic kidney disease: Secondary | ICD-10-CM | POA: Diagnosis not present

## 2019-03-19 DIAGNOSIS — M21372 Foot drop, left foot: Secondary | ICD-10-CM | POA: Diagnosis not present

## 2019-03-19 DIAGNOSIS — G8929 Other chronic pain: Secondary | ICD-10-CM | POA: Diagnosis not present

## 2019-03-23 DIAGNOSIS — N183 Chronic kidney disease, stage 3 unspecified: Secondary | ICD-10-CM | POA: Diagnosis not present

## 2019-03-23 DIAGNOSIS — G35 Multiple sclerosis: Secondary | ICD-10-CM | POA: Diagnosis not present

## 2019-03-23 DIAGNOSIS — M21371 Foot drop, right foot: Secondary | ICD-10-CM | POA: Diagnosis not present

## 2019-03-23 DIAGNOSIS — M21372 Foot drop, left foot: Secondary | ICD-10-CM | POA: Diagnosis not present

## 2019-03-23 DIAGNOSIS — G8929 Other chronic pain: Secondary | ICD-10-CM | POA: Diagnosis not present

## 2019-03-23 DIAGNOSIS — I129 Hypertensive chronic kidney disease with stage 1 through stage 4 chronic kidney disease, or unspecified chronic kidney disease: Secondary | ICD-10-CM | POA: Diagnosis not present

## 2019-03-24 DIAGNOSIS — I129 Hypertensive chronic kidney disease with stage 1 through stage 4 chronic kidney disease, or unspecified chronic kidney disease: Secondary | ICD-10-CM | POA: Diagnosis not present

## 2019-03-24 DIAGNOSIS — G35 Multiple sclerosis: Secondary | ICD-10-CM | POA: Diagnosis not present

## 2019-03-24 DIAGNOSIS — N183 Chronic kidney disease, stage 3 unspecified: Secondary | ICD-10-CM | POA: Diagnosis not present

## 2019-03-24 DIAGNOSIS — M21372 Foot drop, left foot: Secondary | ICD-10-CM | POA: Diagnosis not present

## 2019-03-24 DIAGNOSIS — M21371 Foot drop, right foot: Secondary | ICD-10-CM | POA: Diagnosis not present

## 2019-03-24 DIAGNOSIS — G8929 Other chronic pain: Secondary | ICD-10-CM | POA: Diagnosis not present

## 2019-03-25 ENCOUNTER — Telehealth: Payer: Self-pay | Admitting: *Deleted

## 2019-03-25 DIAGNOSIS — M21372 Foot drop, left foot: Secondary | ICD-10-CM | POA: Diagnosis not present

## 2019-03-25 DIAGNOSIS — I129 Hypertensive chronic kidney disease with stage 1 through stage 4 chronic kidney disease, or unspecified chronic kidney disease: Secondary | ICD-10-CM | POA: Diagnosis not present

## 2019-03-25 DIAGNOSIS — M21371 Foot drop, right foot: Secondary | ICD-10-CM | POA: Diagnosis not present

## 2019-03-25 DIAGNOSIS — N183 Chronic kidney disease, stage 3 unspecified: Secondary | ICD-10-CM | POA: Diagnosis not present

## 2019-03-25 DIAGNOSIS — G8929 Other chronic pain: Secondary | ICD-10-CM | POA: Diagnosis not present

## 2019-03-25 DIAGNOSIS — G35 Multiple sclerosis: Secondary | ICD-10-CM | POA: Diagnosis not present

## 2019-03-25 NOTE — Telephone Encounter (Signed)
Home health nurse Otila Kluver called asking if patient needs to have blood work drawn and f so he needs an appointment.  This patient cancelled his last appointment in March stating he would call back to reschedule, but he never did. He was last seen September 2019. Please advise

## 2019-03-26 NOTE — Telephone Encounter (Signed)
Called Philip Gordon and had to leave her a voicemail to return my call. Patient is needing labs and a follow up with [name].

## 2019-03-26 NOTE — Telephone Encounter (Signed)
Philip Gordon- he needs labs- cbc with diff, cmp, retic count and hapto and see me after that. Video visit is fine too

## 2019-03-30 DIAGNOSIS — N319 Neuromuscular dysfunction of bladder, unspecified: Secondary | ICD-10-CM | POA: Diagnosis not present

## 2019-03-30 DIAGNOSIS — G35 Multiple sclerosis: Secondary | ICD-10-CM | POA: Diagnosis not present

## 2019-03-30 DIAGNOSIS — I129 Hypertensive chronic kidney disease with stage 1 through stage 4 chronic kidney disease, or unspecified chronic kidney disease: Secondary | ICD-10-CM | POA: Diagnosis not present

## 2019-03-30 DIAGNOSIS — G8929 Other chronic pain: Secondary | ICD-10-CM | POA: Diagnosis not present

## 2019-03-30 DIAGNOSIS — M21371 Foot drop, right foot: Secondary | ICD-10-CM | POA: Diagnosis not present

## 2019-03-30 DIAGNOSIS — Z8546 Personal history of malignant neoplasm of prostate: Secondary | ICD-10-CM | POA: Diagnosis not present

## 2019-03-30 DIAGNOSIS — N183 Chronic kidney disease, stage 3 unspecified: Secondary | ICD-10-CM | POA: Diagnosis not present

## 2019-03-30 DIAGNOSIS — M21372 Foot drop, left foot: Secondary | ICD-10-CM | POA: Diagnosis not present

## 2019-03-30 DIAGNOSIS — Z85841 Personal history of malignant neoplasm of brain: Secondary | ICD-10-CM | POA: Diagnosis not present

## 2019-03-30 NOTE — Telephone Encounter (Signed)
Otila Kluver called back and she she stated that she would go ahead and draw the labs [name] requested. Labs will be sent to [facility]. I asked Otila Kluver if I could schedule a video visit for the patient and for her to give to patient. She stated that she would prefer for [place] to call his caregiver (sister) and let her know that he needs an appointment with [name].  I sent a message to Nira Conn to schedule an appointment for patient.

## 2019-03-31 ENCOUNTER — Telehealth: Payer: Self-pay | Admitting: Oncology

## 2019-03-31 NOTE — Telephone Encounter (Signed)
On 03-30-19, writer was asked to schedule patient for a video visit. Writer phoned POA, Enid Derry on that date. POA stated that she wanted to speak with patient's PCP to see if patient needed to see oncology MD and would phone back to schedule, if needed.

## 2019-04-01 ENCOUNTER — Other Ambulatory Visit: Payer: Medicare Other

## 2019-04-01 DIAGNOSIS — G8929 Other chronic pain: Secondary | ICD-10-CM | POA: Diagnosis not present

## 2019-04-01 DIAGNOSIS — I129 Hypertensive chronic kidney disease with stage 1 through stage 4 chronic kidney disease, or unspecified chronic kidney disease: Secondary | ICD-10-CM | POA: Diagnosis not present

## 2019-04-01 DIAGNOSIS — N183 Chronic kidney disease, stage 3 unspecified: Secondary | ICD-10-CM | POA: Diagnosis not present

## 2019-04-01 DIAGNOSIS — M21371 Foot drop, right foot: Secondary | ICD-10-CM | POA: Diagnosis not present

## 2019-04-01 DIAGNOSIS — M21372 Foot drop, left foot: Secondary | ICD-10-CM | POA: Diagnosis not present

## 2019-04-01 DIAGNOSIS — G35 Multiple sclerosis: Secondary | ICD-10-CM | POA: Diagnosis not present

## 2019-04-02 ENCOUNTER — Telehealth: Payer: Self-pay | Admitting: Internal Medicine

## 2019-04-02 ENCOUNTER — Telehealth: Payer: Self-pay

## 2019-04-02 DIAGNOSIS — M21372 Foot drop, left foot: Secondary | ICD-10-CM | POA: Diagnosis not present

## 2019-04-02 DIAGNOSIS — G8929 Other chronic pain: Secondary | ICD-10-CM | POA: Diagnosis not present

## 2019-04-02 DIAGNOSIS — G35 Multiple sclerosis: Secondary | ICD-10-CM | POA: Diagnosis not present

## 2019-04-02 DIAGNOSIS — N183 Chronic kidney disease, stage 3 unspecified: Secondary | ICD-10-CM | POA: Diagnosis not present

## 2019-04-02 DIAGNOSIS — I129 Hypertensive chronic kidney disease with stage 1 through stage 4 chronic kidney disease, or unspecified chronic kidney disease: Secondary | ICD-10-CM | POA: Diagnosis not present

## 2019-04-02 DIAGNOSIS — M21371 Foot drop, right foot: Secondary | ICD-10-CM | POA: Diagnosis not present

## 2019-04-02 NOTE — Telephone Encounter (Addendum)
Telephone call to patient to schedule palliative care visit with patient. Patient/family in agreement with home visit on 04-10-19 at 11:00AM.

## 2019-04-02 NOTE — Telephone Encounter (Signed)
Philip Gordon [phone] called from [facility] regarding in need of a rollator. [facility] Rx to [facility] call [phone] Please advise and Thank you!

## 2019-04-02 NOTE — Addendum Note (Signed)
Addended by: Lars Masson on: 04/02/2019 03:18 PM   Modules accepted: Orders

## 2019-04-02 NOTE — Telephone Encounter (Signed)
DME printed for rolling walker

## 2019-04-02 NOTE — Telephone Encounter (Signed)
Signed and placed in box.   

## 2019-04-03 ENCOUNTER — Encounter: Payer: Self-pay | Admitting: Oncology

## 2019-04-03 NOTE — Telephone Encounter (Signed)
Faxed to number below and left message for [facility]

## 2019-04-05 DIAGNOSIS — M549 Dorsalgia, unspecified: Secondary | ICD-10-CM | POA: Diagnosis not present

## 2019-04-05 DIAGNOSIS — F329 Major depressive disorder, single episode, unspecified: Secondary | ICD-10-CM | POA: Diagnosis not present

## 2019-04-05 DIAGNOSIS — M21371 Foot drop, right foot: Secondary | ICD-10-CM | POA: Diagnosis not present

## 2019-04-05 DIAGNOSIS — Z8546 Personal history of malignant neoplasm of prostate: Secondary | ICD-10-CM | POA: Diagnosis not present

## 2019-04-05 DIAGNOSIS — G35 Multiple sclerosis: Secondary | ICD-10-CM | POA: Diagnosis not present

## 2019-04-05 DIAGNOSIS — N183 Chronic kidney disease, stage 3 unspecified: Secondary | ICD-10-CM | POA: Diagnosis not present

## 2019-04-05 DIAGNOSIS — M21372 Foot drop, left foot: Secondary | ICD-10-CM | POA: Diagnosis not present

## 2019-04-05 DIAGNOSIS — E7800 Pure hypercholesterolemia, unspecified: Secondary | ICD-10-CM | POA: Diagnosis not present

## 2019-04-05 DIAGNOSIS — Z9181 History of falling: Secondary | ICD-10-CM | POA: Diagnosis not present

## 2019-04-05 DIAGNOSIS — G8929 Other chronic pain: Secondary | ICD-10-CM | POA: Diagnosis not present

## 2019-04-05 DIAGNOSIS — N400 Enlarged prostate without lower urinary tract symptoms: Secondary | ICD-10-CM | POA: Diagnosis not present

## 2019-04-05 DIAGNOSIS — F17220 Nicotine dependence, chewing tobacco, uncomplicated: Secondary | ICD-10-CM | POA: Diagnosis not present

## 2019-04-05 DIAGNOSIS — I129 Hypertensive chronic kidney disease with stage 1 through stage 4 chronic kidney disease, or unspecified chronic kidney disease: Secondary | ICD-10-CM | POA: Diagnosis not present

## 2019-04-05 DIAGNOSIS — N319 Neuromuscular dysfunction of bladder, unspecified: Secondary | ICD-10-CM | POA: Diagnosis not present

## 2019-04-05 DIAGNOSIS — Z85841 Personal history of malignant neoplasm of brain: Secondary | ICD-10-CM | POA: Diagnosis not present

## 2019-04-05 DIAGNOSIS — I10 Essential (primary) hypertension: Secondary | ICD-10-CM | POA: Diagnosis not present

## 2019-04-07 DIAGNOSIS — M21371 Foot drop, right foot: Secondary | ICD-10-CM | POA: Diagnosis not present

## 2019-04-07 DIAGNOSIS — I129 Hypertensive chronic kidney disease with stage 1 through stage 4 chronic kidney disease, or unspecified chronic kidney disease: Secondary | ICD-10-CM | POA: Diagnosis not present

## 2019-04-07 DIAGNOSIS — M21372 Foot drop, left foot: Secondary | ICD-10-CM | POA: Diagnosis not present

## 2019-04-07 DIAGNOSIS — G35 Multiple sclerosis: Secondary | ICD-10-CM | POA: Diagnosis not present

## 2019-04-07 DIAGNOSIS — G8929 Other chronic pain: Secondary | ICD-10-CM | POA: Diagnosis not present

## 2019-04-07 DIAGNOSIS — N183 Chronic kidney disease, stage 3 unspecified: Secondary | ICD-10-CM | POA: Diagnosis not present

## 2019-04-10 ENCOUNTER — Telehealth: Payer: Self-pay | Admitting: *Deleted

## 2019-04-10 ENCOUNTER — Other Ambulatory Visit: Payer: Self-pay

## 2019-04-10 ENCOUNTER — Other Ambulatory Visit: Payer: Medicare Other

## 2019-04-10 DIAGNOSIS — N183 Chronic kidney disease, stage 3 unspecified: Secondary | ICD-10-CM | POA: Diagnosis not present

## 2019-04-10 DIAGNOSIS — Z515 Encounter for palliative care: Secondary | ICD-10-CM

## 2019-04-10 DIAGNOSIS — I129 Hypertensive chronic kidney disease with stage 1 through stage 4 chronic kidney disease, or unspecified chronic kidney disease: Secondary | ICD-10-CM | POA: Diagnosis not present

## 2019-04-10 DIAGNOSIS — M21371 Foot drop, right foot: Secondary | ICD-10-CM | POA: Diagnosis not present

## 2019-04-10 DIAGNOSIS — G8929 Other chronic pain: Secondary | ICD-10-CM | POA: Diagnosis not present

## 2019-04-10 DIAGNOSIS — M21372 Foot drop, left foot: Secondary | ICD-10-CM | POA: Diagnosis not present

## 2019-04-10 DIAGNOSIS — G35 Multiple sclerosis: Secondary | ICD-10-CM | POA: Diagnosis not present

## 2019-04-10 NOTE — Progress Notes (Signed)
COMMUNITY PALLIATIVE CARE SW NOTE  PATIENT NAME: Philip Gordon DOB: [birth year] MRN: [id number]  PRIMARY CARE PROVIDER: Einar Pheasant, MD  RESPONSIBLE PARTY:  Acct ID - Guarantor Home Phone Work Phone Relationship Acct Type  [id number] Doristine [phone]  Self P/F     [address], [address], [place], [place] [address]     PLAN OF CARE and INTERVENTIONS:             1. GOALS OF CARE/ ADVANCE CARE PLANNING:  Patient is DNR, form is in the home. HCPOA is [name], patient's cousin. Goal is to get stronger and move around more.  2. SOCIAL/EMOTIONAL/SPIRITUAL ASSESSMENT/ INTERVENTIONS:  SW met with patient, [name] and Enid Derry (patient's cousin and his wife) in the home. [name], private caregiver, was present in the home. Patient denies pain today. Patient's appetite is good. Patient said he is sleeping "fair". Enid Derry discussed ED visit on 12/18. Patient has now stopped amlodipine and hydrochlorothiazide per Enid Derry. Patient's blood pressure has been in normal range. Patient does have nasal drainage, ongoing allergy concerns. Enid Derry said patient uses a sinus wash system to help with this. [name] noted that they are considering ENT visit but are cautious with doctor visits due to [place]. SW provided supportive counseling, discussed care and goals, and used active and reflective listening. Provided safety education. Updated RN. 3. PATIENT/CAREGIVER EDUCATION/ COPING:  Patient was alert, engaged in conversation.  Patient is forgetful. Patient was calm, pleasant. Patient openly expressed feelings. Patient feels that he is coping well. Family is supportive but concerned with patient understanding of his limitations. Encouraged ongoing conversation.  4. PERSONAL EMERGENCY PLAN:  Family/caregiver will call 9-1-1 for emergencies. Patient has LifeAlert necklace.  5. COMMUNITY RESOURCES COORDINATION/ HEALTH CARE NAVIGATION:  Patient was receiving home health, Jackquline Denmark is discharging this week.  Patient was receiving RN and PT. Patient is waiting on order for rollator. SW contacted Carolynn Sayers, SW with [facility] to request another order be faxed. Patient is scheduled to see PCP on 1/29. 6. FINANCIAL/LEGAL CONCERNS/INTERVENTIONS:  None.     SOCIAL HX:  Social History   Tobacco Use  . Smoking status: Never Smoker  . Smokeless tobacco: Current User    Types: Chew  . Tobacco comment: Is going to try and cut down on the amount.   Substance Use Topics  . Alcohol use: No    Alcohol/week: 2.0 standard drinks    Types: 2 Cans of beer per week    CODE STATUS:   Code Status: Prior (DNR) ADVANCED DIRECTIVES: Y MOST FORM COMPLETE:  No. HOSPICE EDUCATION PROVIDED: None.  PPS:  Patient is able to feed himself. Patient needs assistance with bathing and tolieting. Patient ambulates with his walker.  I spent49mnutes with patient/family, from11:00a-11:45aproviding education, support and consultation.  WMargaretmary Lombard LCSW

## 2019-04-10 NOTE — Telephone Encounter (Signed)
Called to speak to Bassett about her brother in Sports coach. I asked if we could make an appt to gover the results that md had done by [facility]. She states that she already got the results from [facility]. I explained that in order to cont. Seeing the pt. And monitoring his care he will need to have an office visit. She states that he has not came out of house since covid except for him going to hospital and then going to rehab and back home. He has been getting PT but this week is the last week. He can walk from chair to bed at best and he is unable to come in. I told her that we can do video visit. She is ok with it but nervous about it. She has done Zoom video before and I went over how to do my chart video. She will try and appt given 11 am on 1/18.

## 2019-04-13 ENCOUNTER — Encounter: Payer: Self-pay | Admitting: Oncology

## 2019-04-13 ENCOUNTER — Inpatient Hospital Stay: Payer: Medicare Other | Attending: Oncology | Admitting: Oncology

## 2019-04-13 ENCOUNTER — Other Ambulatory Visit: Payer: Self-pay

## 2019-04-13 DIAGNOSIS — Z8546 Personal history of malignant neoplasm of prostate: Secondary | ICD-10-CM

## 2019-04-13 DIAGNOSIS — F17210 Nicotine dependence, cigarettes, uncomplicated: Secondary | ICD-10-CM | POA: Diagnosis not present

## 2019-04-13 DIAGNOSIS — R5383 Other fatigue: Secondary | ICD-10-CM

## 2019-04-13 DIAGNOSIS — D75839 Thrombocytosis, unspecified: Secondary | ICD-10-CM

## 2019-04-13 DIAGNOSIS — Z79899 Other long term (current) drug therapy: Secondary | ICD-10-CM

## 2019-04-13 DIAGNOSIS — F329 Major depressive disorder, single episode, unspecified: Secondary | ICD-10-CM

## 2019-04-13 DIAGNOSIS — G35 Multiple sclerosis: Secondary | ICD-10-CM

## 2019-04-13 DIAGNOSIS — D473 Essential (hemorrhagic) thrombocythemia: Secondary | ICD-10-CM

## 2019-04-13 DIAGNOSIS — I10 Essential (primary) hypertension: Secondary | ICD-10-CM

## 2019-04-13 DIAGNOSIS — D591 Autoimmune hemolytic anemia, unspecified: Secondary | ICD-10-CM | POA: Diagnosis not present

## 2019-04-14 DIAGNOSIS — N183 Chronic kidney disease, stage 3 unspecified: Secondary | ICD-10-CM | POA: Diagnosis not present

## 2019-04-14 DIAGNOSIS — M21371 Foot drop, right foot: Secondary | ICD-10-CM | POA: Diagnosis not present

## 2019-04-14 DIAGNOSIS — M21372 Foot drop, left foot: Secondary | ICD-10-CM | POA: Diagnosis not present

## 2019-04-14 DIAGNOSIS — G35 Multiple sclerosis: Secondary | ICD-10-CM | POA: Diagnosis not present

## 2019-04-14 DIAGNOSIS — I129 Hypertensive chronic kidney disease with stage 1 through stage 4 chronic kidney disease, or unspecified chronic kidney disease: Secondary | ICD-10-CM | POA: Diagnosis not present

## 2019-04-14 DIAGNOSIS — G8929 Other chronic pain: Secondary | ICD-10-CM | POA: Diagnosis not present

## 2019-04-16 NOTE — Progress Notes (Signed)
I connected with Philip Gordon on 04/16/19 at 11:00 AM EST by video enabled telemedicine visit and verified that I am speaking with the correct person using two identifiers.   I discussed the limitations, risks, security and privacy concerns of performing an evaluation and management service by telemedicine and the availability of in-person appointments. I also discussed with the patient that there may be a patient responsible charge related to this service. The patient expressed understanding and agreed to proceed.  Other persons participating in the visit and their role in the encounter:  Patients healthcare power of attorney Enid Derry  Patient's location:  home Provider's location:  work  Risk analyst Complaint: Routine follow-up of hemolytic anemia  History of present illness: patient is a 69 year old male who sees [name] for multiple sclerosis. He is currently on prednisone taper and also takes Aricept. Recent CBC from 09/04/2016 showed white count of 7.9, H&H of 10.5/30.8 with an MCV of 97.2 and a platelet count of 396. Cold agglutinin/cryoglobulin was susrecent B12, TSH and hepatic function panel was normal. iron study showed an increased iron saturation of 61.5%. Ferritin was 175 and folate was within normal limits.   Patient has secondary progressive MS diagnosed in 1980 now gradually getting worse. He lives alone and uses walker for ambulation. He thinks he is doing well staying alone. But his guardians think otherwise and feel he is failing slowly and needs more help  Results of bloodwork from 10/11/2016 were as follows: CBC showed white count of 6.9, H&H of 10.1/27.8 and a platelet count of 331. Iron studies were within normal limits. CMP was significant for elevated creatinine of 1.3 and elevated total bilirubin of 2.8.Marland Kitchen ANA was negative and C3 and C4 were within normal limits. HIV antibody was negative. Hepatitis B and C testing was negative. Multiple myeloma panel revealed no monoclonal  protein. Rheumatoid factor was negative and ESR was mildly elevated at 23. CRP was elevated at 3.1. Haptoglobin was less than 10. Reticulocyte count was normal at 2.7%.  Review of peripheral blood smear revealed prominent RBC agglutination. 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  CT chest abdomen and pelvis without contrast did not reveal any evidence of adenopathy or malignancy.  Patient has been on observation for his hemolytic anemia and his hemoglobin has remained stable around 10 but with evidence of active hemolysis and has not required any treatment so far   Interval history: Patient and his healthcare power of attorney Enid Derry report that overall he is doing well.  His MS has remained stable.He was in the hospital recently for bradycardia and will be following up with cardiology as an outpatient.   Review of Systems  Constitutional: Positive for malaise/fatigue. Negative for chills, fever and weight loss.  HENT: Negative for congestion, ear discharge and nosebleeds.   Eyes: Negative for blurred vision.  Respiratory: Negative for cough, hemoptysis, sputum production, shortness of breath and wheezing.   Cardiovascular: Negative for chest pain, palpitations, orthopnea and claudication.  Gastrointestinal: Negative for abdominal pain, blood in stool, constipation, diarrhea, heartburn, melena, nausea and vomiting.  Genitourinary: Negative for dysuria, flank pain, frequency, hematuria and urgency.  Musculoskeletal: Negative for back pain, joint pain and myalgias.  Skin: Negative for rash.  Neurological: Negative for dizziness, tingling, focal weakness, seizures, weakness and headaches.  Endo/Heme/Allergies: Does not bruise/bleed easily.  Psychiatric/Behavioral: Negative for depression and suicidal ideas. The patient does not have insomnia.  No  Known Allergies  Past Medical History:  Diagnosis Date  . Depression   . Hypercholesterolemia   . Hypertension   . Multiple sclerosis (HCC)    Sees Dr Ala Bent [facility])  . Prostate cancer [facility])    Followed by Dr [name] and Dr [name]    Past Surgical History:  Procedure Laterality Date  . KNEE SURGERY      Social History   Socioeconomic History  . Marital status: Divorced    Spouse name: Not on file  . Number of children: Not on file  . Years of education: Not on file  . Highest education level: Not on file  Occupational History  . Not on file  Tobacco Use  . Smoking status: Never Smoker  . Smokeless tobacco: Current User    Types: Chew  . Tobacco comment: Is going to try and cut down on the amount.   Substance and Sexual Activity  . Alcohol use: No    Alcohol/week: 2.0 standard drinks    Types: 2 Cans of beer per week  . Drug use: No  . Sexual activity: Not Currently  Other Topics Concern  . Not on file  Social History Narrative  . Not on file   Social Determinants of Health   Financial Resource Strain:   . Difficulty of Paying Living Expenses: Not on file  Food Insecurity:   . Worried About Charity fundraiser in the Last Year: Not on file  . Ran Out of Food in the Last Year: Not on file  Transportation Needs:   . Lack of Transportation (Medical): Not on file  . Lack of Transportation (Non-Medical): Not on file  Physical Activity:   . Days of Exercise per Week: Not on file  . Minutes of Exercise per Session: Not on file  Stress:   . Feeling of Stress : Not on file  Social Connections:   . Frequency of Communication with Friends and Family: Not on file  . Frequency of Social Gatherings with Friends and Family: Not on file  . Attends Religious Services: Not on file  . Active Member of Clubs or Organizations: Not on file  . Attends Archivist Meetings: Not on file  . Marital Status: Not on file  Intimate Partner Violence:   . Fear of  Current or Ex-Partner: Not on file  . Emotionally Abused: Not on file  . Physically Abused: Not on file  . Sexually Abused: Not on file    Family History  Problem Relation Age of Onset  . Hypertension Mother   . Stroke Father   . Diabetes Maternal Grandmother   . Diabetes Maternal Grandfather   . Leukemia Sister        died - age 15     Current Outpatient Medications:  .  amLODipine (NORVASC) 5 MG tablet, Take 1 tablet (5 mg total) by mouth daily., Disp: 90 tablet, Rfl: 1 .  diphenhydrAMINE (BENADRYL) 25 mg capsule, Take 25 mg by mouth at bedtime as needed. , Disp: , Rfl:  .  donepezil (ARICEPT) 10 MG tablet, Take 10 mg by mouth at bedtime. , Disp: , Rfl: 5 .  ezetimibe (ZETIA) 10 MG tablet, Take 1 tablet (10 mg total) by mouth daily., Disp: 90 tablet, Rfl: 1 .  finasteride (PROSCAR) 5 MG tablet, Take 1 tablet (5 mg total) by mouth daily., Disp: 90 tablet, Rfl: 1 .  fluticasone (FLONASE) 50 MCG/ACT nasal spray, Place 2 sprays into both nostrils daily.,  Disp: 16 g, Rfl: 6 .  gabapentin (NEURONTIN) 100 MG capsule, Take 100 mg by mouth 3 (three) times daily., Disp: , Rfl:  .  hydrochlorothiazide (MICROZIDE) 12.5 MG capsule, TAKE 1 CAPSULE BY MOUTH DAILY. (Patient taking differently: Take 12.5 mg by mouth daily. ), Disp: 90 capsule, Rfl: 1 .  mirtazapine (REMERON) 15 MG tablet, Take 15 mg by mouth at bedtime., Disp: , Rfl:  .  POTASSIUM PO, Take 99 mg by mouth daily., Disp: , Rfl:  .  sertraline (ZOLOFT) 25 MG tablet, Take 1 tablet (25 mg total) by mouth daily., Disp: 90 tablet, Rfl: 1 .  tiZANidine (ZANAFLEX) 4 MG tablet, TAKE 1 TABLET BY MOUTH NIGHTLY (Patient taking differently: Take 4 mg by mouth at bedtime. ), Disp: 30 tablet, Rfl: 4 .  vitamin B-12 (CYANOCOBALAMIN) 1000 MCG tablet, Take 1 tablet (1,000 mcg total) by mouth daily., Disp: 90 tablet, Rfl: 3  No results found.  No images are attached to the encounter.   CMP Latest Ref Rng & Units 03/13/2019  Glucose 70 - 99 mg/dL 88   BUN 8 - 23 mg/dL 17  Creatinine 0.61 - 1.24 mg/dL 1.08  Sodium 135 - 145 mmol/L 138  Potassium 3.5 - 5.1 mmol/L 4.5  Chloride 98 - 111 mmol/L 108  CO2 22 - 32 mmol/L 20(L)  Calcium 8.9 - 10.3 mg/dL 8.6(L)  Total Protein 6.5 - 8.1 g/dL -  Total Bilirubin 0.3 - 1.2 mg/dL -  Alkaline Phos 38 - 126 U/L -  AST 15 - 41 U/L -  ALT 0 - 44 U/L -   CBC Latest Ref Rng & Units 03/13/2019  WBC 4.0 - 10.5 K/uL 9.0  Hemoglobin 13.0 - 17.0 g/dL 9.2(L)  Hematocrit 39.0 - 52.0 % 26.3(L)  Platelets 150 - 400 K/uL 409(H)     Observation/objective: Appears in no acute distress of a video visit today.  Breathing is nonlabored  Assessment and plan: Patient is a 69 year old male with history of Autoimmune hemolytic anemia currently remains under observation and has not required any treatment so far.  This is a routine follow-up visit  I have reviewed patient's recent blood work done at [facility] from 03/30/2019 which showed an H&H of 9.1/26.2.  White count was 7.3 and platelets 525.  Bilirubin 2.1.  Sed rate was mildly elevated at 121.  Haptoglobin less than 10 reticulocyte count was 5.6% which is still indicate some ongoing hemolysis but his hemoglobin has overall remained stable between 9-10 over the last 1-1/2-year.  I will consider treatment with steroids or Rituxan if his hemoglobin drifts down closer to eight.  Patient wishes to see fewer doctors and I will get in touch with Dr. Nicki Reaper to see if she can keep an eye on his hemolysis labs every 4 to 6 months.  If his hemoglobin starts drifting down he can be referred to [place] for further management.  Patient has had normal platelet counts in the past but noted to have a mildly elevated platelet count in the last 1 month which could be reactive.  Continue to monitor  Follow-up instructions: I have gotten in touch with Dr. Nicki Reaper and awaiting her reply regarding patient's follow-up and I will communicate with patient and his caregiver once I hear back from her  I  discussed the assessment and treatment plan with the patient. The patient was provided an opportunity to ask questions and all were answered. The patient agreed with the plan and demonstrated an understanding of the instructions.  The patient was advised to call back or seek an in-person evaluation if the symptoms worsen or if the condition fails to improve as anticipated.   Visit Diagnosis: 1. Autoimmune hemolytic anemia   2. Thrombocytosis ([place])     Dr. Randa Evens, MD, MPH [facility] Tel- [phone] 04/16/2019 8:34 AM

## 2019-04-17 ENCOUNTER — Telehealth: Payer: Self-pay | Admitting: *Deleted

## 2019-04-17 NOTE — Telephone Encounter (Signed)
Called the sister in law [place]. I told her that [name] has spoke to Dr. Einar Pheasant and she will monitor the pt's labs and if hgb goes down too far then dr scott will communicate to [name] and we will need to see him again. Enid Derry is agreeable to this plan

## 2019-04-18 ENCOUNTER — Encounter: Payer: Self-pay | Admitting: Internal Medicine

## 2019-04-18 DIAGNOSIS — D591 Autoimmune hemolytic anemia, unspecified: Secondary | ICD-10-CM | POA: Insufficient documentation

## 2019-04-24 ENCOUNTER — Other Ambulatory Visit: Payer: Self-pay

## 2019-04-24 ENCOUNTER — Ambulatory Visit: Payer: Medicare Other | Admitting: Internal Medicine

## 2019-04-24 ENCOUNTER — Encounter: Payer: Self-pay | Admitting: Internal Medicine

## 2019-04-24 ENCOUNTER — Ambulatory Visit (INDEPENDENT_AMBULATORY_CARE_PROVIDER_SITE_OTHER): Payer: Medicare Other | Admitting: Internal Medicine

## 2019-04-24 DIAGNOSIS — Z8546 Personal history of malignant neoplasm of prostate: Secondary | ICD-10-CM | POA: Diagnosis not present

## 2019-04-24 DIAGNOSIS — I10 Essential (primary) hypertension: Secondary | ICD-10-CM | POA: Diagnosis not present

## 2019-04-24 DIAGNOSIS — D891 Cryoglobulinemia: Secondary | ICD-10-CM | POA: Diagnosis not present

## 2019-04-24 DIAGNOSIS — E7800 Pure hypercholesterolemia, unspecified: Secondary | ICD-10-CM | POA: Diagnosis not present

## 2019-04-24 DIAGNOSIS — G35 Multiple sclerosis: Secondary | ICD-10-CM | POA: Diagnosis not present

## 2019-04-24 NOTE — Progress Notes (Signed)
Patient ID: Philip Gordon, male   DOB: [birth year], 69 y.o.   MRN: [id number]   Virtual Visit via video Note  This visit type was conducted due to national recommendations for restrictions regarding the COVID-19 pandemic (e.g. social distancing).  This format is felt to be most appropriate for this patient at this time.  All issues noted in this document were discussed and addressed.  No physical exam was performed (except for noted visual exam findings with Video Visits).   I connected with Philip Gordon by a video enabled telemedicine application and verified that I am speaking with the correct person using two identifiers. Location patient: home Location provider: work  Persons participating in the virtual visit: patient, provider and pts aunt.   the limitations, risks, security and privacy concerns of performing an evaluation and management service by video and the availability of in person appointments have been discussed.  The patient expressed understanding and agreed to proceed.   Reason for visit: scheduled follow up.    HPI: Followed by MS by Dr Manuella Ghazi.  On aricept. pts aunt and uncle are living with him currently.  States things are stable.  Using a walker.  Needs a new walker - rollator walker with seat.  Unable to get around without the walker.  Has been followed by Dr Janese Banks - for f/u hemolytic anemia.  hgb has been stable 9-10.  Plans to follow up here regarding the anemia.  Was seen in ER 03/13/19 with bradycardia.  Off amlodipine and hctz.  Blood pressure and pulse doing ok off medication.  No chest pain.  No sob.  No acid reflux.  No abdominal pain.  Bowels moving.     ROS: See pertinent positives and negatives per HPI.  Past Medical History:  Diagnosis Date  . Depression   . Hypercholesterolemia   . Hypertension   . Multiple sclerosis (HCC)    Sees Dr Ala Bent [facility])  . Prostate cancer [facility])    Followed by Dr [name] and Dr [name]    Past Surgical History:    Procedure Laterality Date  . KNEE SURGERY      Family History  Problem Relation Age of Onset  . Hypertension Mother   . Stroke Father   . Diabetes Maternal Grandmother   . Diabetes Maternal Grandfather   . Leukemia Sister        died - age 86    SOCIAL HX: reviewed.    Current Outpatient Medications:  .  diphenhydrAMINE (BENADRYL) 25 mg capsule, Take 25 mg by mouth at bedtime as needed. , Disp: , Rfl:  .  donepezil (ARICEPT) 10 MG tablet, Take 10 mg by mouth at bedtime. , Disp: , Rfl: 5 .  ezetimibe (ZETIA) 10 MG tablet, Take 1 tablet (10 mg total) by mouth daily., Disp: 90 tablet, Rfl: 1 .  finasteride (PROSCAR) 5 MG tablet, Take 1 tablet (5 mg total) by mouth daily., Disp: 90 tablet, Rfl: 1 .  fluticasone (FLONASE) 50 MCG/ACT nasal spray, Place 2 sprays into both nostrils daily., Disp: 16 g, Rfl: 6 .  gabapentin (NEURONTIN) 100 MG capsule, Take 100 mg by mouth 3 (three) times daily., Disp: , Rfl:  .  mirtazapine (REMERON) 15 MG tablet, Take 15 mg by mouth at bedtime., Disp: , Rfl:  .  Multiple Vitamin (MULTIVITAMIN) capsule, Take 1 capsule by mouth daily., Disp: , Rfl:  .  POTASSIUM PO, Take 99 mg by mouth daily., Disp: , Rfl:  .  Probiotic Product (  PROBIOTIC-10 PO), Take by mouth., Disp: , Rfl:  .  sertraline (ZOLOFT) 25 MG tablet, Take 1 tablet (25 mg total) by mouth daily., Disp: 90 tablet, Rfl: 1 .  tiZANidine (ZANAFLEX) 4 MG tablet, TAKE 1 TABLET BY MOUTH NIGHTLY (Patient taking differently: Take 4 mg by mouth at bedtime. ), Disp: 30 tablet, Rfl: 4 .  vitamin B-12 (CYANOCOBALAMIN) 1000 MCG tablet, Take 1 tablet (1,000 mcg total) by mouth daily., Disp: 90 tablet, Rfl: 3  EXAM:  VITALS per patient if applicable:  [id number].  Weight 145lbs  GENERAL: alert, oriented, appears well and in no acute distress  HEENT: atraumatic, conjunttiva clear, no obvious abnormalities on inspection of external nose and ears  NECK: normal movements of the head and neck  LUNGS: on  inspection no signs of respiratory distress, breathing rate appears normal, no obvious gross SOB, gasping or wheezing  CV: no obvious cyanosis  PSYCH/NEURO: pleasant and cooperative, no obvious depression or anxiety, speech and thought processing grossly intact   ASSESSMENT AND PLAN:  Discussed the following assessment and plan:  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[place]) Followed by hematology.   History of prostate cancer Had been followed by urology.  Check psa with next labs.    Hypercholesteremia Unable to take statin medication.  Has been on zetia.  Follow lipid panel.   Hypertension Blood pressure doing well off medication.  Follow.    Multiple sclerosis ([place]) Followed by Dr Manuella Ghazi.  Unable to get around without walker.  Needs rollator walker with seat.  Follow.     No orders of the defined types were placed in this encounter.   No orders of the defined types were placed in this encounter.    I discussed the assessment and treatment plan with the patient. The patient was provided an opportunity to ask questions and all were answered. The patient agreed with the plan and demonstrated an understanding of the instructions.   The patient was advised to call back or seek an in-person evaluation if the symptoms worsen or if the condition fails to improve as anticipated.   Einar Pheasant, MD

## 2019-04-26 ENCOUNTER — Encounter: Payer: Self-pay | Admitting: Internal Medicine

## 2019-04-26 NOTE — Assessment & Plan Note (Signed)
Unable to take statin medication.  Has been on zetia.  Follow lipid panel.

## 2019-04-26 NOTE — Assessment & Plan Note (Signed)
Had been followed by urology.  Check psa with next labs.

## 2019-04-26 NOTE — Assessment & Plan Note (Signed)
Blood pressure doing well off medication.  Follow.

## 2019-04-26 NOTE — Assessment & Plan Note (Signed)
Followed by Dr Manuella Ghazi.  Unable to get around without walker.  Needs rollator walker with seat.  Follow.

## 2019-04-26 NOTE — Assessment & Plan Note (Signed)
Followed by hematology 

## 2019-04-30 ENCOUNTER — Telehealth: Payer: Self-pay

## 2019-04-30 NOTE — Telephone Encounter (Signed)
Telephone call to schedule palliative care visit.  Philip Gordon patients cousins wife in agreement with RN making home visit 05/12/19 at 10:00 AM.

## 2019-05-05 ENCOUNTER — Telehealth: Payer: Self-pay | Admitting: Internal Medicine

## 2019-05-05 DIAGNOSIS — G35 Multiple sclerosis: Secondary | ICD-10-CM

## 2019-05-05 NOTE — Telephone Encounter (Signed)
POA Enid Derry called about rx for walker that needs to be sent to [facility] in [place], [place].

## 2019-05-06 NOTE — Telephone Encounter (Signed)
DME reprinted for walker. Do not have signed copy scanned in chart. Placed in quick sign for your signature

## 2019-05-06 NOTE — Telephone Encounter (Signed)
I signed order and will sign hard copy when I return to office tomorrow if needed.  Please hold message until signed and sent.  Thanks.

## 2019-05-08 NOTE — Telephone Encounter (Signed)
Faxed

## 2019-05-11 ENCOUNTER — Telehealth: Payer: Self-pay

## 2019-05-11 ENCOUNTER — Telehealth: Payer: Self-pay | Admitting: Internal Medicine

## 2019-05-11 NOTE — Telephone Encounter (Signed)
err

## 2019-05-11 NOTE — Telephone Encounter (Signed)
RN returned call to patients [place] to reschedule visit.  Enid Derry reports patient has appointment in the AM to receive his CoVid vaccine.  Enid Derry in agreement with RN making home visit on Thuesday 05-14-19 at 11:00 AM.

## 2019-05-11 NOTE — Telephone Encounter (Signed)
Refaxed to [phone]

## 2019-05-11 NOTE — Telephone Encounter (Signed)
Philip Gordon states that [facility] has not received fax. Please re fax to [phone].

## 2019-05-12 ENCOUNTER — Other Ambulatory Visit: Payer: Medicare Other

## 2019-05-14 ENCOUNTER — Other Ambulatory Visit: Payer: Medicare Other

## 2019-05-14 ENCOUNTER — Telehealth: Payer: Self-pay

## 2019-05-14 ENCOUNTER — Other Ambulatory Visit: Payer: Self-pay

## 2019-05-14 NOTE — Telephone Encounter (Signed)
Telephone call to reschedule visit for today.  Philip Gordon in agreement with RN makinghome visit on Monday 05/18/19 at 11:00 AM.

## 2019-05-15 ENCOUNTER — Telehealth: Payer: Self-pay | Admitting: Internal Medicine

## 2019-05-15 NOTE — Telephone Encounter (Signed)
Philip Gordon, patient's POA called back and said that [facility] in [place] need to have proof that he needs this walker. Please call [facility] for more information, [phone].

## 2019-05-15 NOTE — Telephone Encounter (Signed)
Patients wife called and medical supply still did not receive the fax. Try this fax number [phone].

## 2019-05-15 NOTE — Telephone Encounter (Signed)
Patient's wife called about getting a walker and the order was not sent to [facility] [employer] [facility]. Faxed on 05/15/2019 at 12:44pm , sent fax twice and both times came back complete. Tried to call patient to let him know, lm on vm.

## 2019-05-15 NOTE — Telephone Encounter (Signed)
Per Philip Gordon, refaxed twice and message left on VM.

## 2019-05-18 ENCOUNTER — Other Ambulatory Visit: Payer: Self-pay

## 2019-05-18 ENCOUNTER — Other Ambulatory Visit: Payer: Medicare Other

## 2019-05-18 DIAGNOSIS — Z515 Encounter for palliative care: Secondary | ICD-10-CM

## 2019-05-18 NOTE — Telephone Encounter (Signed)
[facility]ed [facility]. [facility]ed [facility] to [facility] and attached OV note.

## 2019-05-18 NOTE — Progress Notes (Signed)
PATIENT NAME: DEMONTEZ DEROUSSE DOB: [birth year] MRN: [id number]  PRIMARY CARE PROVIDER: Einar Pheasant, MD  RESPONSIBLE PARTY:  Acct ID - Guarantor Home Phone Work Phone Relationship Acct Type  [id number] Doristine [phone]  Self P/F     [address], [place], [place] [address]    PLAN OF CARE and INTERVENTIONS:               1.  GOALS OF CARE/ ADVANCE CARE PLANNING:  Remain in home with caregiver in home 6 days per week/3 hours per day and patients cousin and wife also checking on patient.                2.  PATIENT/CAREGIVER EDUCATION:  Education on fall precautions, education on need to continue with strengthening exercises, educaion on need to increase fluid intake, support                   4. PERSONAL EMERGENCY PLAN:  Patient has Lifeline in place.               5.  DISEASE STATUS:RN made scheduled palliative care home visit. Patient sitting in his chair watching TV. Patients cousin [name] and [name]'s wife Enid Derry as well as private caregiver in home with patient. Patient denies having any pain at the present time. Patient received his Covid vaccine last week and has had no side effects from receiving vaccine. Patient's vital signs are stable. Enid Derry reports patient is not doing well with ambulating and has not been doing exercises to increase his strength as recommended by PT. Patient has braces in place on lower legs. Patient has not suffered any recent falls.  Enid Derry reports patient got choked last PM with eating dinner. Patient has not been drinking a lot of water and Enid Derry is concerned about this. Nurse provided support to patient and encouraged patient to increase fluid intake and to do exercises. [name] and Enid Derry will be moving out in a few weeks and have been staying with patient while their home was completed. Patient states he has enjoyed having them in his home. Patient rents his apartment from [place]. Patient's appetite is good. Patient denies having any nausea  or vomiting. Patient denies having any shortness of breath or cough other than when he got choked last night. Patient did have some loose stools and had difficulty getting to the bathroom per [place]. Patient has had no medication changes other than Hydrochlorothiazide, Amlodipine and potassium was stopped as patients blood pressure has been low.  Patient has Lifeline in place. Patient has not seen primary care provider but has had telephonic visits with Dr. Nicki Reaper. Nurse reviewed patient's medications with Enid Derry.   Patient and family remain in agreement with palliative care services and all were encouraged to contact palliative care with questions or concerns.      HISTORY OF PRESENT ILLNESS:  Patient is a 69 year old patient who is followed by palliative care.  Patient is seen monthly and PRN.   CODE STATUS: DNR  ADVANCED DIRECTIVES: Y MOST FORM: No PPS: 40%   PHYSICAL EXAM:   VITALS: Today's Vitals   05/18/19 1147  BP: 106/68  Pulse: 65  Resp: 18  Temp: 97.6 F (36.4 C)  TempSrc: Temporal  SpO2: 98%  PainSc: 0-No pain    LUNGS: clear to auscultation  CARDIAC: Cor RRR  EXTREMITIES: none edema SKIN: no visible open areas of skin breakdown  NEURO: positive for gait problems and memory problems  Debara Kamphuis, RN 

## 2019-06-09 ENCOUNTER — Telehealth: Payer: Self-pay

## 2019-06-09 DIAGNOSIS — Z7409 Other reduced mobility: Secondary | ICD-10-CM | POA: Diagnosis not present

## 2019-06-09 NOTE — Telephone Encounter (Signed)
Telephone call to patient to schedule palliative care visit with patient. Patient/family in agreement with home visit on 06-10-19 at 11:00AM.

## 2019-06-10 ENCOUNTER — Other Ambulatory Visit: Payer: Medicare Other

## 2019-06-10 ENCOUNTER — Other Ambulatory Visit: Payer: Self-pay

## 2019-06-10 DIAGNOSIS — Z515 Encounter for palliative care: Secondary | ICD-10-CM

## 2019-06-10 NOTE — Progress Notes (Signed)
COMMUNITY PALLIATIVE CARE SW NOTE  PATIENT NAME: Philip Gordon DOB: 1950-04-05 MRN: [id number]  PRIMARY CARE PROVIDER: Einar Pheasant, MD  RESPONSIBLE PARTY:  Acct ID - Guarantor Home Phone Work Phone Relationship Acct Type  [id number] Philip [phone]  Self P/F     [address], [address], [place], [place] [address]     PLAN OF CARE and INTERVENTIONS:             1. GOALS OF CARE/ ADVANCE CARE PLANNING:  Patient is DNR, form is in the home. HCPOA is Philip Gordon,patient's cousin. Goal is to get stronger and move around more. 2. SOCIAL/EMOTIONAL/SPIRITUAL ASSESSMENT/ INTERVENTIONS:  SW met with patient and Philip Gordon (patient's cousin wife) in the home. Patient feels he is having a flare-up of his MS, feels weaker. Patient denies pain. Patient walked to his bedroom with his walker, SW discussed safety precautions. Patient denies falls. Patient reports good appetite. Patient said he is sleeping "fair", does nap during the day. Patient has received both COVID vaccines, no issues. Patient is starting prednisone 10 mg tablet to help with mobility following neurology telehealth visit. Patient continues to experience nasal drainage, PCP suggested trying Bendryal at night to help with this. SW provided supportive counseling, discussed care, and used active and reflective listening. Discussed long-term care options and encouraged ongoing discussion with family. 3. PATIENT/CAREGIVER EDUCATION/ COPING:  Patient was alert, engaged.  Patient is forgetful, at times. Patient openly expresses feelings. Patient feels that he is coping well, denies concerns. Family is supportive, as patient allows. 4. PERSONAL EMERGENCY PLAN:  Family/caregiver will call 9-1-1 for emergencies. Patient has LifeAlert necklace. 5. COMMUNITY RESOURCES COORDINATION/ HEALTH CARE NAVIGATION:  Patient has completed home health. Patient had in-home aides 3 hours/day for six days a week. Patient is scheduled for urology follow-up in  April. 6. FINANCIAL/LEGAL CONCERNS/INTERVENTIONS:  None.     SOCIAL HX:  Social History   Tobacco Use  . Smoking status: Never Smoker  . Smokeless tobacco: Current User    Types: Chew  . Tobacco comment: Is going to try and cut down on the amount.   Substance Use Topics  . Alcohol use: No    Alcohol/week: 2.0 standard drinks    Types: 2 Cans of beer per week    CODE STATUS:   Code Status: Prior (DNR) ADVANCED DIRECTIVES: Y MOST FORM COMPLETE:  No. HOSPICE EDUCATION PROVIDED: None.  PPS: Patient is able to feed himself. Patient needs assistance with bathing and tolieting.Patient ambulates slowly with his walker.  I spent81mnutes with patient/family, from11:00a-11:45aproviding education, support and consultation.  WMargaretmary Lombard LCSW

## 2019-06-15 DIAGNOSIS — C61 Malignant neoplasm of prostate: Secondary | ICD-10-CM | POA: Diagnosis not present

## 2019-06-15 DIAGNOSIS — M47812 Spondylosis without myelopathy or radiculopathy, cervical region: Secondary | ICD-10-CM | POA: Diagnosis not present

## 2019-06-15 DIAGNOSIS — I10 Essential (primary) hypertension: Secondary | ICD-10-CM | POA: Diagnosis not present

## 2019-06-15 DIAGNOSIS — G35 Multiple sclerosis: Secondary | ICD-10-CM | POA: Diagnosis not present

## 2019-06-18 DIAGNOSIS — C61 Malignant neoplasm of prostate: Secondary | ICD-10-CM | POA: Diagnosis not present

## 2019-06-18 DIAGNOSIS — I10 Essential (primary) hypertension: Secondary | ICD-10-CM | POA: Diagnosis not present

## 2019-06-18 DIAGNOSIS — G35 Multiple sclerosis: Secondary | ICD-10-CM | POA: Diagnosis not present

## 2019-06-18 DIAGNOSIS — M47812 Spondylosis without myelopathy or radiculopathy, cervical region: Secondary | ICD-10-CM | POA: Diagnosis not present

## 2019-06-19 DIAGNOSIS — C61 Malignant neoplasm of prostate: Secondary | ICD-10-CM | POA: Diagnosis not present

## 2019-06-19 DIAGNOSIS — I10 Essential (primary) hypertension: Secondary | ICD-10-CM | POA: Diagnosis not present

## 2019-06-19 DIAGNOSIS — G35 Multiple sclerosis: Secondary | ICD-10-CM | POA: Diagnosis not present

## 2019-06-19 DIAGNOSIS — M47812 Spondylosis without myelopathy or radiculopathy, cervical region: Secondary | ICD-10-CM | POA: Diagnosis not present

## 2019-06-22 DIAGNOSIS — M47812 Spondylosis without myelopathy or radiculopathy, cervical region: Secondary | ICD-10-CM | POA: Diagnosis not present

## 2019-06-22 DIAGNOSIS — G35 Multiple sclerosis: Secondary | ICD-10-CM | POA: Diagnosis not present

## 2019-06-22 DIAGNOSIS — I10 Essential (primary) hypertension: Secondary | ICD-10-CM | POA: Diagnosis not present

## 2019-06-22 DIAGNOSIS — C61 Malignant neoplasm of prostate: Secondary | ICD-10-CM | POA: Diagnosis not present

## 2019-06-24 DIAGNOSIS — I10 Essential (primary) hypertension: Secondary | ICD-10-CM | POA: Diagnosis not present

## 2019-06-24 DIAGNOSIS — G35 Multiple sclerosis: Secondary | ICD-10-CM | POA: Diagnosis not present

## 2019-06-24 DIAGNOSIS — M47812 Spondylosis without myelopathy or radiculopathy, cervical region: Secondary | ICD-10-CM | POA: Diagnosis not present

## 2019-06-24 DIAGNOSIS — C61 Malignant neoplasm of prostate: Secondary | ICD-10-CM | POA: Diagnosis not present

## 2019-06-25 ENCOUNTER — Telehealth: Payer: Self-pay | Admitting: Internal Medicine

## 2019-06-25 MED ORDER — SERTRALINE HCL 25 MG PO TABS: 25.0000 mg | ORAL_TABLET | Freq: Every day | ORAL | 1 refills | Status: AC

## 2019-06-25 NOTE — Telephone Encounter (Signed)
Pt needs a refill on sertraline (ZOLOFT) 25 MG tablet sent to the mail order

## 2019-06-25 NOTE — Addendum Note (Signed)
Addended by: Elpidio Galea T on: 06/25/2019 01:17 PM   Modules accepted: Orders

## 2019-06-29 DIAGNOSIS — G35 Multiple sclerosis: Secondary | ICD-10-CM | POA: Diagnosis not present

## 2019-06-29 DIAGNOSIS — I10 Essential (primary) hypertension: Secondary | ICD-10-CM | POA: Diagnosis not present

## 2019-06-29 DIAGNOSIS — M47812 Spondylosis without myelopathy or radiculopathy, cervical region: Secondary | ICD-10-CM | POA: Diagnosis not present

## 2019-06-29 DIAGNOSIS — C61 Malignant neoplasm of prostate: Secondary | ICD-10-CM | POA: Diagnosis not present

## 2019-07-01 ENCOUNTER — Telehealth: Payer: Self-pay

## 2019-07-01 DIAGNOSIS — C61 Malignant neoplasm of prostate: Secondary | ICD-10-CM | POA: Diagnosis not present

## 2019-07-01 DIAGNOSIS — G35 Multiple sclerosis: Secondary | ICD-10-CM | POA: Diagnosis not present

## 2019-07-01 DIAGNOSIS — I10 Essential (primary) hypertension: Secondary | ICD-10-CM | POA: Diagnosis not present

## 2019-07-01 DIAGNOSIS — M47812 Spondylosis without myelopathy or radiculopathy, cervical region: Secondary | ICD-10-CM | POA: Diagnosis not present

## 2019-07-01 NOTE — Telephone Encounter (Signed)
Telephone call to patients cousins wife Enid Derry to schedule palliative care visit.  RN left message requesting call back to schedule visit.

## 2019-07-02 ENCOUNTER — Telehealth: Payer: Self-pay

## 2019-07-02 NOTE — Telephone Encounter (Signed)
Telephone call from Philip Gordon returning RN's call to schedule palliative care visit. Philip Gordon in agreement with RN makin ghome visit 07/06/19 at 12:30 PM.

## 2019-07-06 ENCOUNTER — Other Ambulatory Visit: Payer: Self-pay

## 2019-07-06 ENCOUNTER — Other Ambulatory Visit: Payer: Medicare Other

## 2019-07-06 DIAGNOSIS — M47812 Spondylosis without myelopathy or radiculopathy, cervical region: Secondary | ICD-10-CM | POA: Diagnosis not present

## 2019-07-06 DIAGNOSIS — Z515 Encounter for palliative care: Secondary | ICD-10-CM

## 2019-07-06 DIAGNOSIS — C61 Malignant neoplasm of prostate: Secondary | ICD-10-CM | POA: Diagnosis not present

## 2019-07-06 DIAGNOSIS — I10 Essential (primary) hypertension: Secondary | ICD-10-CM | POA: Diagnosis not present

## 2019-07-06 DIAGNOSIS — G35 Multiple sclerosis: Secondary | ICD-10-CM | POA: Diagnosis not present

## 2019-07-06 NOTE — Progress Notes (Signed)
PATIENT NAME: Philip Gordon DOB: 1950-06-21 MRN: [id number]  PRIMARY CARE PROVIDER: Einar Pheasant, MD  RESPONSIBLE PARTY:  Acct ID - Guarantor Home Phone Work Phone Relationship Acct Type  [id number] Doristine [phone]  Self P/F     [address], [address], [place], [place] [address]    PLAN OF CARE and INTERVENTIONS:               1.  GOALS OF CARE/ ADVANCE CARE PLANNING:  Remain in home and remain independent for as long as possible.                2.  PATIENT/CAREGIVER EDUCATION:  Education on fall precautions, education on s/s of infection, reviewed meds, support               4. PERSONAL EMERGENCY PLAN:  [name] has lifeline in place.               5.  DISEASE STATUS: RN made scheduled palliative care home visit. Patient sitting in recliner, [facility] in home with patient. Abagail Kitchens has patient stand and complete physical therapy exercises. Abagail Kitchens reports patient is doing well with physical therapy. Patient alert and denies having any pain. Patient has Lifeline necklace in place. Patient reports his balance has been off and [place] lowered patients rollator walker. Abagail Kitchens made recommendation patient walk closer into his walker to be more steady and hopefully prevent falls. Patient reports he is to see neurologist [name] tomorrow. Patient states he may go into the hospital for 4 to 5 days to receive treatment for his MS, patient does this periodically. Patient denies having any shortness of breath or cough. Patient's vital signs are stable. Patient continues to have hired caregiver in the home 6 days a week from 9 AM to 12 noon everyday. Patient also has leg braces in place on lower legs. Patient reports his appetite is good and his last weight was 145 lbs. Patient reports he gets enough sleep but does wake up during the night. Patient continues to enjoy chewing his tobacco. Patient talks about traveling to [place] and playing softball. Patient remains a DNR  and does have advance  directives in place. Patients cousin [name] and his wife remain supportive and [name] will take patient to MD appointment tomorrow. Patient remains in agreement with palliative care services. Patient encouraged to contact palliative care with questions or concerns.    HISTORY OF PRESENT ILLNESS:  Patient is a 69 year old male  Who resides in his home.  Patient being followed by palliative care and is seen monthly and PRN.  CODE STATUS: DNR  ADVANCED DIRECTIVES: Yes MOST FORM: No PPS: 40%   PHYSICAL EXAM:   VITALS: Today's Vitals   07/06/19 1234  BP: 122/78  Pulse: (!) 56  Resp: 16  Temp: 98.4 F (36.9 C)  TempSrc: Temporal  SpO2: 99%  Weight: 145 lb (65.8 kg)  PainSc: 0-No pain    LUNGS: clear breath sounds/ denies cough or shortness of breath CARDIAC: heart rate regular, pulse 56 EXTREMITIES: no edema braces on lower legs bilaterally SKIN: no open areas of skin breakdown  NEURO: Alert and oriented/ forgetful       Nilda Simmer, RN

## 2019-07-07 DIAGNOSIS — G35 Multiple sclerosis: Secondary | ICD-10-CM | POA: Diagnosis not present

## 2019-07-08 ENCOUNTER — Ambulatory Visit: Payer: Medicare Other | Admitting: Urology

## 2019-07-08 DIAGNOSIS — R531 Weakness: Secondary | ICD-10-CM | POA: Diagnosis not present

## 2019-07-08 DIAGNOSIS — Z79899 Other long term (current) drug therapy: Secondary | ICD-10-CM | POA: Diagnosis not present

## 2019-07-08 DIAGNOSIS — M4802 Spinal stenosis, cervical region: Secondary | ICD-10-CM | POA: Diagnosis not present

## 2019-07-08 DIAGNOSIS — R2689 Other abnormalities of gait and mobility: Secondary | ICD-10-CM | POA: Diagnosis not present

## 2019-07-08 DIAGNOSIS — I10 Essential (primary) hypertension: Secondary | ICD-10-CM | POA: Diagnosis not present

## 2019-07-08 DIAGNOSIS — M21371 Foot drop, right foot: Secondary | ICD-10-CM | POA: Diagnosis not present

## 2019-07-08 DIAGNOSIS — G35 Multiple sclerosis: Secondary | ICD-10-CM | POA: Diagnosis not present

## 2019-07-08 DIAGNOSIS — M21372 Foot drop, left foot: Secondary | ICD-10-CM | POA: Diagnosis not present

## 2019-07-09 DIAGNOSIS — M21371 Foot drop, right foot: Secondary | ICD-10-CM | POA: Diagnosis present

## 2019-07-09 DIAGNOSIS — R296 Repeated falls: Secondary | ICD-10-CM | POA: Diagnosis present

## 2019-07-09 DIAGNOSIS — M4802 Spinal stenosis, cervical region: Secondary | ICD-10-CM | POA: Insufficient documentation

## 2019-07-09 DIAGNOSIS — I10 Essential (primary) hypertension: Secondary | ICD-10-CM | POA: Diagnosis not present

## 2019-07-09 DIAGNOSIS — Z66 Do not resuscitate: Secondary | ICD-10-CM | POA: Diagnosis present

## 2019-07-09 DIAGNOSIS — R768 Other specified abnormal immunological findings in serum: Secondary | ICD-10-CM | POA: Diagnosis not present

## 2019-07-09 DIAGNOSIS — N400 Enlarged prostate without lower urinary tract symptoms: Secondary | ICD-10-CM | POA: Diagnosis present

## 2019-07-09 DIAGNOSIS — E785 Hyperlipidemia, unspecified: Secondary | ICD-10-CM | POA: Diagnosis not present

## 2019-07-09 DIAGNOSIS — R531 Weakness: Secondary | ICD-10-CM | POA: Diagnosis not present

## 2019-07-09 DIAGNOSIS — C61 Malignant neoplasm of prostate: Secondary | ICD-10-CM | POA: Diagnosis present

## 2019-07-09 DIAGNOSIS — F3289 Other specified depressive episodes: Secondary | ICD-10-CM | POA: Diagnosis not present

## 2019-07-09 DIAGNOSIS — G35 Multiple sclerosis: Secondary | ICD-10-CM | POA: Diagnosis present

## 2019-07-09 DIAGNOSIS — M21372 Foot drop, left foot: Secondary | ICD-10-CM | POA: Diagnosis present

## 2019-07-09 DIAGNOSIS — I159 Secondary hypertension, unspecified: Secondary | ICD-10-CM | POA: Diagnosis not present

## 2019-07-09 DIAGNOSIS — Z9181 History of falling: Secondary | ICD-10-CM | POA: Diagnosis not present

## 2019-07-09 DIAGNOSIS — Z79899 Other long term (current) drug therapy: Secondary | ICD-10-CM | POA: Diagnosis not present

## 2019-07-09 DIAGNOSIS — Z20822 Contact with and (suspected) exposure to covid-19: Secondary | ICD-10-CM | POA: Diagnosis present

## 2019-07-09 DIAGNOSIS — Z85841 Personal history of malignant neoplasm of brain: Secondary | ICD-10-CM | POA: Diagnosis not present

## 2019-07-09 DIAGNOSIS — M6281 Muscle weakness (generalized): Secondary | ICD-10-CM | POA: Diagnosis not present

## 2019-07-09 DIAGNOSIS — F329 Major depressive disorder, single episode, unspecified: Secondary | ICD-10-CM | POA: Diagnosis present

## 2019-07-09 DIAGNOSIS — M4804 Spinal stenosis, thoracic region: Secondary | ICD-10-CM | POA: Diagnosis not present

## 2019-07-09 DIAGNOSIS — Z8546 Personal history of malignant neoplasm of prostate: Secondary | ICD-10-CM | POA: Diagnosis not present

## 2019-07-09 DIAGNOSIS — F0150 Vascular dementia without behavioral disturbance: Secondary | ICD-10-CM | POA: Diagnosis not present

## 2019-07-09 DIAGNOSIS — D649 Anemia, unspecified: Secondary | ICD-10-CM | POA: Diagnosis present

## 2019-07-09 DIAGNOSIS — R5381 Other malaise: Secondary | ICD-10-CM | POA: Diagnosis not present

## 2019-07-09 DIAGNOSIS — R471 Dysarthria and anarthria: Secondary | ICD-10-CM | POA: Diagnosis not present

## 2019-07-09 DIAGNOSIS — R2681 Unsteadiness on feet: Secondary | ICD-10-CM | POA: Diagnosis not present

## 2019-07-09 DIAGNOSIS — R262 Difficulty in walking, not elsewhere classified: Secondary | ICD-10-CM | POA: Diagnosis not present

## 2019-07-11 DIAGNOSIS — R768 Other specified abnormal immunological findings in serum: Secondary | ICD-10-CM | POA: Insufficient documentation

## 2019-07-11 DIAGNOSIS — R7689 Other specified abnormal immunological findings in serum: Secondary | ICD-10-CM | POA: Insufficient documentation

## 2019-07-13 ENCOUNTER — Ambulatory Visit: Payer: Self-pay | Admitting: Urology

## 2019-07-14 DIAGNOSIS — F3289 Other specified depressive episodes: Secondary | ICD-10-CM | POA: Diagnosis not present

## 2019-07-14 DIAGNOSIS — R471 Dysarthria and anarthria: Secondary | ICD-10-CM | POA: Diagnosis not present

## 2019-07-14 DIAGNOSIS — D649 Anemia, unspecified: Secondary | ICD-10-CM | POA: Diagnosis not present

## 2019-07-14 DIAGNOSIS — Z8546 Personal history of malignant neoplasm of prostate: Secondary | ICD-10-CM | POA: Diagnosis not present

## 2019-07-14 DIAGNOSIS — E7800 Pure hypercholesterolemia, unspecified: Secondary | ICD-10-CM | POA: Diagnosis not present

## 2019-07-14 DIAGNOSIS — J302 Other seasonal allergic rhinitis: Secondary | ICD-10-CM | POA: Diagnosis not present

## 2019-07-14 DIAGNOSIS — M4802 Spinal stenosis, cervical region: Secondary | ICD-10-CM | POA: Diagnosis not present

## 2019-07-14 DIAGNOSIS — D508 Other iron deficiency anemias: Secondary | ICD-10-CM | POA: Diagnosis not present

## 2019-07-14 DIAGNOSIS — B351 Tinea unguium: Secondary | ICD-10-CM | POA: Diagnosis not present

## 2019-07-14 DIAGNOSIS — R262 Difficulty in walking, not elsewhere classified: Secondary | ICD-10-CM | POA: Diagnosis not present

## 2019-07-14 DIAGNOSIS — E559 Vitamin D deficiency, unspecified: Secondary | ICD-10-CM | POA: Diagnosis not present

## 2019-07-14 DIAGNOSIS — F329 Major depressive disorder, single episode, unspecified: Secondary | ICD-10-CM | POA: Diagnosis not present

## 2019-07-14 DIAGNOSIS — M6281 Muscle weakness (generalized): Secondary | ICD-10-CM | POA: Diagnosis not present

## 2019-07-14 DIAGNOSIS — R296 Repeated falls: Secondary | ICD-10-CM | POA: Diagnosis not present

## 2019-07-14 DIAGNOSIS — Z515 Encounter for palliative care: Secondary | ICD-10-CM | POA: Diagnosis not present

## 2019-07-14 DIAGNOSIS — G35 Multiple sclerosis: Secondary | ICD-10-CM | POA: Diagnosis not present

## 2019-07-14 DIAGNOSIS — M21372 Foot drop, left foot: Secondary | ICD-10-CM | POA: Diagnosis not present

## 2019-07-14 DIAGNOSIS — Z23 Encounter for immunization: Secondary | ICD-10-CM | POA: Diagnosis not present

## 2019-07-14 DIAGNOSIS — E785 Hyperlipidemia, unspecified: Secondary | ICD-10-CM | POA: Diagnosis not present

## 2019-07-14 DIAGNOSIS — R41840 Attention and concentration deficit: Secondary | ICD-10-CM | POA: Diagnosis not present

## 2019-07-14 DIAGNOSIS — M79674 Pain in right toe(s): Secondary | ICD-10-CM | POA: Diagnosis not present

## 2019-07-14 DIAGNOSIS — M79675 Pain in left toe(s): Secondary | ICD-10-CM | POA: Diagnosis not present

## 2019-07-14 DIAGNOSIS — Z1159 Encounter for screening for other viral diseases: Secondary | ICD-10-CM | POA: Diagnosis not present

## 2019-07-14 DIAGNOSIS — M4712 Other spondylosis with myelopathy, cervical region: Secondary | ICD-10-CM | POA: Diagnosis not present

## 2019-07-14 DIAGNOSIS — F0150 Vascular dementia without behavioral disturbance: Secondary | ICD-10-CM | POA: Diagnosis not present

## 2019-07-14 DIAGNOSIS — R531 Weakness: Secondary | ICD-10-CM | POA: Diagnosis not present

## 2019-07-14 DIAGNOSIS — N400 Enlarged prostate without lower urinary tract symptoms: Secondary | ICD-10-CM | POA: Diagnosis not present

## 2019-07-14 DIAGNOSIS — R2681 Unsteadiness on feet: Secondary | ICD-10-CM | POA: Diagnosis not present

## 2019-07-14 DIAGNOSIS — M21371 Foot drop, right foot: Secondary | ICD-10-CM | POA: Diagnosis not present

## 2019-07-14 DIAGNOSIS — I10 Essential (primary) hypertension: Secondary | ICD-10-CM | POA: Diagnosis not present

## 2019-07-14 DIAGNOSIS — N390 Urinary tract infection, site not specified: Secondary | ICD-10-CM | POA: Diagnosis not present

## 2019-07-14 MED ORDER — EZETIMIBE 10 MG PO TABS
10.00 | ORAL_TABLET | ORAL | Status: DC
Start: 2019-07-15 — End: 2019-07-14

## 2019-07-14 MED ORDER — MELATONIN 3 MG PO TABS
3.00 | ORAL_TABLET | ORAL | Status: DC
Start: ? — End: 2019-07-14

## 2019-07-14 MED ORDER — ENOXAPARIN SODIUM 40 MG/0.4ML ~~LOC~~ SOLN
40.00 | SUBCUTANEOUS | Status: DC
Start: 2019-07-15 — End: 2019-07-14

## 2019-07-14 MED ORDER — DONEPEZIL HCL 10 MG PO TABS
10.00 | ORAL_TABLET | ORAL | Status: DC
Start: 2019-07-14 — End: 2019-07-14

## 2019-07-14 MED ORDER — CARBOXYMETHYLCELLULOSE SODIUM 0.5 % OP SOLN
1.00 | OPHTHALMIC | Status: DC
Start: ? — End: 2019-07-14

## 2019-07-14 MED ORDER — MIRTAZAPINE 15 MG PO TABS
15.00 | ORAL_TABLET | ORAL | Status: DC
Start: 2019-07-14 — End: 2019-07-14

## 2019-07-14 MED ORDER — CHOLECALCIFEROL 25 MCG (1000 UT) PO TABS
1000.00 | ORAL_TABLET | ORAL | Status: DC
Start: 2019-07-15 — End: 2019-07-14

## 2019-07-14 MED ORDER — SERTRALINE HCL 25 MG PO TABS
25.00 | ORAL_TABLET | ORAL | Status: DC
Start: 2019-07-15 — End: 2019-07-14

## 2019-07-14 MED ORDER — ACETAMINOPHEN 325 MG PO TABS
650.00 | ORAL_TABLET | ORAL | Status: DC
Start: ? — End: 2019-07-14

## 2019-07-14 MED ORDER — FINASTERIDE 5 MG PO TABS
5.00 | ORAL_TABLET | ORAL | Status: DC
Start: 2019-07-15 — End: 2019-07-14

## 2019-07-14 MED ORDER — LIDOCAINE HCL 1 % IJ SOLN
0.50 | INTRAMUSCULAR | Status: DC
Start: ? — End: 2019-07-14

## 2019-07-14 MED ORDER — CYANOCOBALAMIN 500 MCG PO TABS
500.00 | ORAL_TABLET | ORAL | Status: DC
Start: 2019-07-15 — End: 2019-07-14

## 2019-07-14 MED ORDER — POLYETHYLENE GLYCOL 3350 17 GM/SCOOP PO POWD
17.00 | ORAL | Status: DC
Start: ? — End: 2019-07-14

## 2019-07-16 DIAGNOSIS — N400 Enlarged prostate without lower urinary tract symptoms: Secondary | ICD-10-CM | POA: Diagnosis not present

## 2019-07-16 DIAGNOSIS — F329 Major depressive disorder, single episode, unspecified: Secondary | ICD-10-CM | POA: Diagnosis not present

## 2019-07-16 DIAGNOSIS — E559 Vitamin D deficiency, unspecified: Secondary | ICD-10-CM | POA: Diagnosis not present

## 2019-07-16 DIAGNOSIS — G35 Multiple sclerosis: Secondary | ICD-10-CM | POA: Diagnosis not present

## 2019-07-16 DIAGNOSIS — E785 Hyperlipidemia, unspecified: Secondary | ICD-10-CM | POA: Diagnosis not present

## 2019-07-16 DIAGNOSIS — M6281 Muscle weakness (generalized): Secondary | ICD-10-CM | POA: Diagnosis not present

## 2019-07-16 DIAGNOSIS — M4712 Other spondylosis with myelopathy, cervical region: Secondary | ICD-10-CM | POA: Diagnosis not present

## 2019-07-23 DIAGNOSIS — M6281 Muscle weakness (generalized): Secondary | ICD-10-CM | POA: Diagnosis not present

## 2019-07-23 DIAGNOSIS — R41840 Attention and concentration deficit: Secondary | ICD-10-CM | POA: Diagnosis not present

## 2019-07-23 DIAGNOSIS — E785 Hyperlipidemia, unspecified: Secondary | ICD-10-CM | POA: Diagnosis not present

## 2019-07-23 DIAGNOSIS — F329 Major depressive disorder, single episode, unspecified: Secondary | ICD-10-CM | POA: Diagnosis not present

## 2019-07-23 DIAGNOSIS — D649 Anemia, unspecified: Secondary | ICD-10-CM | POA: Diagnosis not present

## 2019-07-23 DIAGNOSIS — E559 Vitamin D deficiency, unspecified: Secondary | ICD-10-CM | POA: Diagnosis not present

## 2019-07-23 DIAGNOSIS — M4712 Other spondylosis with myelopathy, cervical region: Secondary | ICD-10-CM | POA: Diagnosis not present

## 2019-07-23 DIAGNOSIS — N400 Enlarged prostate without lower urinary tract symptoms: Secondary | ICD-10-CM | POA: Diagnosis not present

## 2019-07-23 DIAGNOSIS — G35 Multiple sclerosis: Secondary | ICD-10-CM | POA: Diagnosis not present

## 2019-07-29 DIAGNOSIS — M79675 Pain in left toe(s): Secondary | ICD-10-CM | POA: Diagnosis not present

## 2019-07-29 DIAGNOSIS — G35 Multiple sclerosis: Secondary | ICD-10-CM | POA: Diagnosis not present

## 2019-07-29 DIAGNOSIS — M21371 Foot drop, right foot: Secondary | ICD-10-CM | POA: Diagnosis not present

## 2019-07-29 DIAGNOSIS — M21372 Foot drop, left foot: Secondary | ICD-10-CM | POA: Diagnosis not present

## 2019-07-29 DIAGNOSIS — B351 Tinea unguium: Secondary | ICD-10-CM | POA: Diagnosis not present

## 2019-07-29 DIAGNOSIS — M79674 Pain in right toe(s): Secondary | ICD-10-CM | POA: Diagnosis not present

## 2019-07-30 ENCOUNTER — Telehealth: Payer: Self-pay | Admitting: Internal Medicine

## 2019-07-30 NOTE — Telephone Encounter (Signed)
Philip Gordon, pt's POA would like Dr. Nicki Reaper to address longterm Care with patient on 08/05/2019 appointment. Patient's MS is getting worse. If you need to speak to Enid Derry please call her at [phone].

## 2019-07-30 NOTE — Telephone Encounter (Signed)
FYI for his upcoming appt.

## 2019-08-04 ENCOUNTER — Other Ambulatory Visit: Payer: Self-pay | Admitting: *Deleted

## 2019-08-04 DIAGNOSIS — M6281 Muscle weakness (generalized): Secondary | ICD-10-CM | POA: Diagnosis not present

## 2019-08-04 DIAGNOSIS — M4712 Other spondylosis with myelopathy, cervical region: Secondary | ICD-10-CM | POA: Diagnosis not present

## 2019-08-04 DIAGNOSIS — G35 Multiple sclerosis: Secondary | ICD-10-CM | POA: Diagnosis not present

## 2019-08-04 DIAGNOSIS — E785 Hyperlipidemia, unspecified: Secondary | ICD-10-CM | POA: Diagnosis not present

## 2019-08-04 DIAGNOSIS — N400 Enlarged prostate without lower urinary tract symptoms: Secondary | ICD-10-CM | POA: Diagnosis not present

## 2019-08-04 DIAGNOSIS — R41840 Attention and concentration deficit: Secondary | ICD-10-CM | POA: Diagnosis not present

## 2019-08-04 DIAGNOSIS — E559 Vitamin D deficiency, unspecified: Secondary | ICD-10-CM | POA: Diagnosis not present

## 2019-08-04 DIAGNOSIS — F329 Major depressive disorder, single episode, unspecified: Secondary | ICD-10-CM | POA: Diagnosis not present

## 2019-08-04 NOTE — Patient Outreach (Signed)
Screened for potential [facility] Care Management needs as a benefit of  [employer].  Philip Gordon is currently receiving skilled therapy at [facility].   Writer attended telephonic interdisciplinary team meeting to assess for disposition needs and transition plan for resident.   Facility reports member will transition to ALF likely next week.   Marthenia Rolling, MSN-Ed, RN,BSN [place] Acute Care Coordinator [phone] [name]) [phone]  (Toll free office)

## 2019-08-05 ENCOUNTER — Other Ambulatory Visit: Payer: Self-pay

## 2019-08-05 ENCOUNTER — Ambulatory Visit (INDEPENDENT_AMBULATORY_CARE_PROVIDER_SITE_OTHER): Payer: Medicare Other | Admitting: Internal Medicine

## 2019-08-05 ENCOUNTER — Telehealth: Payer: Self-pay

## 2019-08-05 DIAGNOSIS — R531 Weakness: Secondary | ICD-10-CM | POA: Diagnosis not present

## 2019-08-05 DIAGNOSIS — G35 Multiple sclerosis: Secondary | ICD-10-CM | POA: Diagnosis not present

## 2019-08-05 DIAGNOSIS — E7800 Pure hypercholesterolemia, unspecified: Secondary | ICD-10-CM

## 2019-08-05 DIAGNOSIS — Z8546 Personal history of malignant neoplasm of prostate: Secondary | ICD-10-CM

## 2019-08-05 DIAGNOSIS — I10 Essential (primary) hypertension: Secondary | ICD-10-CM

## 2019-08-05 NOTE — Telephone Encounter (Signed)
Telephone call to patients cousin's wife Enid Derry. Enid Derry reports patient remains at [facility]. Enid Derry states the plan at this time is for patient to be a resident at [facility]. Enid Derry states patient will not be returning home. Enid Derry reports her goal would be to possibly have patient go to [facility]. Nurse will update palliative care admin team as well as NP Ralene Bathe.  Enid Derry informed to call with questions or concerns.

## 2019-08-05 NOTE — Progress Notes (Signed)
Patient ID: Philip Gordon, male   DOB: 03-04-1951, 69 y.o.   MRN: [id number]   Virtual Visit via telephone Note  This visit type was conducted due to national recommendations for restrictions regarding the COVID-19 pandemic (e.g. social distancing).  This format is felt to be most appropriate for this patient at this time.  All issues noted in this document were discussed and addressed.  No physical exam was performed (except for noted visual exam findings with Video Visits).   I connected with [name] by telephone and verified that I am speaking with the correct person using two identifiers. Location patient: home Location provider: work  Persons participating in the telephone visit: patient, provider  The limitations, risks, security and privacy concerns of performing an evaluation and management service by telephone and the availability of in person appointments have been discussed. The patient expressed understanding and agreed to proceed.   Reason for visit:  Work in appt  HPI: Work in appt to discuss his overall clinical status and plans for continued care in a facility.  Has a history of MS.  Followed by neurology.  Prior to his admission 07/08/19, he was getting weaker.  Harder for him to walk, even with his walker. MRI brain, c-spine and T-spine without enhancing lesion or evidence of active demyelination, but cervical stenosis.  PT/OT evaluated and pt discharged to SNF.  He is at [facility] now.  Reports his last day will be next week.  He (per report) has been approved for long term care.  Discussed with him today.  Dicussed concerns regarding him returning home.  Cousin (who helps care for him at home), feels he cannot return home.  This was discussed as well.  Pt agreeable to assisted living facility or higher level of care.  Aware of concerns regarding returning home.  Would like to move to [facility]  States family member working on this transition.  Does need help bating and  with ADLs.  Needs  Help preparing food.  Does feel stronger since working with PT/OT.  Is able to ambulate with walker.  Weight - 135 pounds.  No chest pain or sob reported.  No abdominal pain.  Bowels stable.     ROS: See pertinent positives and negatives per HPI.  Past Medical History:  Diagnosis Date  . Depression   . Hypercholesterolemia   . Hypertension   . Multiple sclerosis (HCC)    Sees Dr Ala Bent [facility])  . Prostate cancer [facility])    Followed by Dr [name] and Dr [name]    Past Surgical History:  Procedure Laterality Date  . KNEE SURGERY      Family History  Problem Relation Age of Onset  . Hypertension Mother   . Stroke Father   . Diabetes Maternal Grandmother   . Diabetes Maternal Grandfather   . Leukemia Sister        died - age 71    SOCIAL HX: reviewed.    Current Outpatient Medications:  .  diphenhydrAMINE (BENADRYL) 25 mg capsule, Take 25 mg by mouth at bedtime as needed. , Disp: , Rfl:  .  donepezil (ARICEPT) 10 MG tablet, Take 10 mg by mouth at bedtime. , Disp: , Rfl: 5 .  ezetimibe (ZETIA) 10 MG tablet, Take 1 tablet (10 mg total) by mouth daily., Disp: 90 tablet, Rfl: 1 .  finasteride (PROSCAR) 5 MG tablet, Take 1 tablet (5 mg total) by mouth daily., Disp: 90 tablet, Rfl: 1 .  fluticasone (  FLONASE) 50 MCG/ACT nasal spray, Place 2 sprays into both nostrils daily., Disp: 16 g, Rfl: 6 .  gabapentin (NEURONTIN) 100 MG capsule, Take 100 mg by mouth 3 (three) times daily., Disp: , Rfl:  .  mirtazapine (REMERON) 15 MG tablet, Take 15 mg by mouth at bedtime., Disp: , Rfl:  .  Multiple Vitamin (MULTIVITAMIN) capsule, Take 1 capsule by mouth daily., Disp: , Rfl:  .  POTASSIUM PO, Take 99 mg by mouth daily., Disp: , Rfl:  .  Probiotic Product (PROBIOTIC-10 PO), Take by mouth., Disp: , Rfl:  .  sertraline (ZOLOFT) 25 MG tablet, Take 1 tablet (25 mg total) by mouth daily., Disp: 90 tablet, Rfl: 1 .  tiZANidine (ZANAFLEX) 4 MG tablet, TAKE 1 TABLET BY MOUTH NIGHTLY  (Patient taking differently: Take 4 mg by mouth at bedtime. ), Disp: 30 tablet, Rfl: 4 .  vitamin B-12 (CYANOCOBALAMIN) 1000 MCG tablet, Take 1 tablet (1,000 mcg total) by mouth daily., Disp: 90 tablet, Rfl: 3  EXAM:  VITALS per patient if applicable: weight [id number] pounds.   GENERAL: alert.  Sounds to be in no acute distress.  Answering questions appropriately.    PSYCH/NEURO: pleasant and cooperative, no obvious depression or anxiety, speech and thought processing grossly intact  ASSESSMENT AND PLAN:  Discussed the following assessment and plan:  Weakness Doing better now.  In SNF.  Has been working with PT/OT.  Able to ambulate with walker.  Felt unable to return home.  Needs help with ADLs, preparing meals, etc.  Plan for continued care - assisted living, skilled facility, etc - pending PT's assessment.  Pt in agreement.    Multiple sclerosis ([place]) Followed by neurology.  Most recent MRIs - on recent admission- no enhancing lesions or evidence of active demyelination, but did have cervical stenosis.  Continue to follow up with neurology.  Has been working with PT/OT.  Able to ambulate better.  Follow.    Hypertension Blood pressure has been controlled.  Not on blood pressure medication now.  Follow.    Hypercholesteremia Unable to take statin medication.  On zeita.  Follow lipid panel.   History of prostate cancer Has been followed by urology and oncology.      I discussed the assessment and treatment plan with the patient. The patient was provided an opportunity to ask questions and all were answered. The patient agreed with the plan and demonstrated an understanding of the instructions.   The patient was advised to call back or seek an in-person evaluation if the symptoms worsen or if the condition fails to improve as anticipated.  I provided 25 minutes of non-face-to-face time during this encounter.   Einar Pheasant, MD

## 2019-08-10 ENCOUNTER — Non-Acute Institutional Stay: Payer: Medicare Other | Admitting: Primary Care

## 2019-08-10 ENCOUNTER — Other Ambulatory Visit: Payer: Self-pay

## 2019-08-10 ENCOUNTER — Encounter: Payer: Self-pay | Admitting: Internal Medicine

## 2019-08-10 DIAGNOSIS — Z515 Encounter for palliative care: Secondary | ICD-10-CM

## 2019-08-10 NOTE — Assessment & Plan Note (Signed)
Has been followed by urology and oncology.

## 2019-08-10 NOTE — Progress Notes (Signed)
[facility] Consult Note Telephone: [phone]  Fax: [phone]  PATIENT NAME: Philip Gordon [place] [address] [phone] (home)  DOB: 1950-06-14 MRN: [id number]  PRIMARY CARE PROVIDER:    Juluis Pitch, MD,  [address] [place] [address] [phone]  REFERRING PROVIDER:   Juluis Pitch, [place] [address] [phone]  RESPONSIBLE PARTY:   Extended Emergency Contact Information Primary Emergency Contact: Jamarkis Branam,[name] Address: [address]          [place],  [address] [place] Phone: [phone] Relation: Other Secondary Emergency Contact: Eddrick Dilone,Shirley  [place] Phone: [phone] Relation: Friend  I met with patient and family in the facility.  ASSESSMENT AND RECOMMENDATIONS:   1. Advance Care Planning/Goals of Care: Goals include to maximize quality of life and symptom management. Most on file at the skilled facility with DNR comfort measures limited antibiotics and IV no feeding tube.   2. Symptom Management:   I met with Philip Gordon in his room. He was alert and oriented times 2 to 3. States that he had not had a bath yet and would like one. I let the nursing staff know that he would need a bath on second shift. We were also joined by his cousin and POA at the end of our talk. Philip Gordon stated that he is not able to get up and do things by himself. He states he is looking for a long-term care situation and may stay at [facility]. He says he's not eating well and has lost weight. His cousin brought him a milkshake, which he did consume part of. He states his appetite is not good. He denies pain and other discomforts. We discuss the role of palliative care that palliative medicine address symptoms and quality-of-life. He said that he thought that would be helpful. I will continue to follow every 6 to 8 weeks or as  needed.  3. Family /Caregiver/Community Supports: Cousin and wife are POA, scheduled to move to LTC.  4. Cognitive / Functional decline:  A and O, discusses readily but difficult to understand. Losing function, hence need to move to more care provision. Tremors, difficulty holding things.  5. Follow up Palliative Care Visit: Palliative care will continue to follow for goals of care clarification and symptom management. Return 4 weeks or prn.  I spent 35 minutes providing this consultation,  from 1400 to 1434. More than 50% of the time in this consultation was spent coordinating communication.   HISTORY OF PRESENT ILLNESS:  Philip Gordon is a 69 y.o. year old male with multiple medical problems including MS, depression, dysarthria. Palliative Care was asked to follow this patient by consultation request of Juluis Pitch, MD. to help address advance care planning and goals of care. This is the initial visit.  CODE STATUS:  Most on file at the skilled facility with DNR comfort measures limited antibiotics and IV no feeding tube.   PPS: 30% HOSPICE ELIGIBILITY/DIAGNOSIS: TBD  PAST MEDICAL HISTORY:  Past Medical History:  Diagnosis Date  . Depression   . Hypercholesterolemia   . Hypertension   . Multiple sclerosis (HCC)    Sees Dr Ala Bent [facility])  . Prostate cancer ([place])    Followed by Dr [name] and Dr [name]    SOCIAL HX:  Social History   Tobacco Use  . Smoking status: Never Smoker  . Smokeless tobacco: Current User  Types: Chew  . Tobacco comment: Is going to try and cut down on the amount.   Substance Use Topics  . Alcohol use: No    Alcohol/week: 2.0 standard drinks    Types: 2 Cans of beer per week    ALLERGIES: No Known Allergies   PERTINENT MEDICATIONS:  Outpatient Encounter Medications as of 08/10/2019  Medication Sig  . diphenhydrAMINE (BENADRYL) 25 mg capsule Take 25 mg by mouth at bedtime as needed.   . donepezil (ARICEPT) 10 MG tablet Take 10 mg by  mouth at bedtime.   Marland Kitchen ezetimibe (ZETIA) 10 MG tablet Take 1 tablet (10 mg total) by mouth daily.  . finasteride (PROSCAR) 5 MG tablet Take 1 tablet (5 mg total) by mouth daily.  . fluticasone (FLONASE) 50 MCG/ACT nasal spray Place 2 sprays into both nostrils daily.  Marland Kitchen gabapentin (NEURONTIN) 100 MG capsule Take 100 mg by mouth 3 (three) times daily.  . mirtazapine (REMERON) 15 MG tablet Take 15 mg by mouth at bedtime.  . Multiple Vitamin (MULTIVITAMIN) capsule Take 1 capsule by mouth daily.  Marland Kitchen POTASSIUM PO Take 99 mg by mouth daily.  . Probiotic Product (PROBIOTIC-10 PO) Take by mouth.  . sertraline (ZOLOFT) 25 MG tablet Take 1 tablet (25 mg total) by mouth daily.  Marland Kitchen tiZANidine (ZANAFLEX) 4 MG tablet TAKE 1 TABLET BY MOUTH NIGHTLY (Patient taking differently: Take 4 mg by mouth at bedtime. )  . vitamin B-12 (CYANOCOBALAMIN) 1000 MCG tablet Take 1 tablet (1,000 mcg total) by mouth daily.   No facility-administered encounter medications on file as of 08/10/2019.    PHYSICAL EXAM / ROS:   Current and past weights: 143 lbs 1 month ago General: NAD, frail appearing, thin Cardiovascular: no chest pain reported, no LE edema  Pulmonary: no cough, no increased SOB, room air Abdomen: appetite fair, continent of bowel GU: denies dysuria, continent of urine MSK:  + joint and ROM abnormalities,  states ambulatory with walker and assistance Skin: no rashes or wounds reported, scalp skin anomaly Neurological: Alert, oriented x 3, dysarthria, endorses weakness in gait  Jason Coop, NP [facility]  COVID-19 PATIENT SCREENING TOOL  Person answering questions: ____________Staff_______ _____   1.  Is the patient or any family member in the home showing any signs or symptoms regarding respiratory infection?               Person with Symptom- __________NA_________________  a. Fever                                                                          Yes___ No___           ___________________  b. Shortness of breath                                                    Yes___ No___          ___________________ c. Cough/congestion  Yes___  No___         ___________________ d. Body aches/pains                                                         Yes___ No___        ____________________ e. Gastrointestinal symptoms (diarrhea, nausea)           Yes___ No___        ____________________  2. Within the past 14 days, has anyone living in the home had any contact with someone with or under investigation for COVID-19?    Yes___ No_X_   Person __________________   

## 2019-08-10 NOTE — Assessment & Plan Note (Signed)
Unable to take statin medication.  On zeita.  Follow lipid panel.

## 2019-08-10 NOTE — Assessment & Plan Note (Signed)
Blood pressure has been controlled.  Not on blood pressure medication now.  Follow.

## 2019-08-10 NOTE — Assessment & Plan Note (Signed)
Doing better now.  In SNF.  Has been working with PT/OT.  Able to ambulate with walker.  Felt unable to return home.  Needs help with ADLs, preparing meals, etc.  Plan for continued care - assisted living, skilled facility, etc - pending PT's assessment.  Pt in agreement.

## 2019-08-10 NOTE — Assessment & Plan Note (Addendum)
Followed by neurology.  Most recent MRIs - on recent admission- no enhancing lesions or evidence of active demyelination, but did have cervical stenosis.  Continue to follow up with neurology.  Has been working with PT/OT.  Able to ambulate better.  Follow.

## 2019-08-11 ENCOUNTER — Telehealth: Payer: Self-pay | Admitting: Internal Medicine

## 2019-08-11 ENCOUNTER — Other Ambulatory Visit: Payer: Self-pay | Admitting: *Deleted

## 2019-08-11 NOTE — Patient Outreach (Signed)
[facility] Post- Acute Care Coordinator follow up  Member remains at [facility]. He has converted to private pay until transition to ALF. Member is also being followed [facility] palliative care for ongoing goals of care discussions.   No identifiable [facility] Care Management needs at this time.  Marthenia Rolling, MSN-Ed, RN,BSN [place] Acute Care Coordinator [phone] [facility]) [phone]  (Toll free office)

## 2019-08-11 NOTE — Telephone Encounter (Signed)
Philip Gordon states that she is the POA for pt and would like to know when his labs are available from [facility]? Please advise

## 2019-08-14 NOTE — Telephone Encounter (Signed)
We have not received labs from [facility]. LM for POA

## 2019-08-19 DIAGNOSIS — M6281 Muscle weakness (generalized): Secondary | ICD-10-CM | POA: Diagnosis not present

## 2019-08-19 DIAGNOSIS — N400 Enlarged prostate without lower urinary tract symptoms: Secondary | ICD-10-CM | POA: Diagnosis not present

## 2019-08-19 DIAGNOSIS — E559 Vitamin D deficiency, unspecified: Secondary | ICD-10-CM | POA: Diagnosis not present

## 2019-08-19 DIAGNOSIS — G35 Multiple sclerosis: Secondary | ICD-10-CM | POA: Diagnosis not present

## 2019-08-19 DIAGNOSIS — F329 Major depressive disorder, single episode, unspecified: Secondary | ICD-10-CM | POA: Diagnosis not present

## 2019-08-19 DIAGNOSIS — R41840 Attention and concentration deficit: Secondary | ICD-10-CM | POA: Diagnosis not present

## 2019-08-19 DIAGNOSIS — E785 Hyperlipidemia, unspecified: Secondary | ICD-10-CM | POA: Diagnosis not present

## 2019-08-19 DIAGNOSIS — D649 Anemia, unspecified: Secondary | ICD-10-CM | POA: Diagnosis not present

## 2019-08-19 DIAGNOSIS — M4712 Other spondylosis with myelopathy, cervical region: Secondary | ICD-10-CM | POA: Diagnosis not present

## 2019-08-25 ENCOUNTER — Telehealth: Payer: Self-pay

## 2019-08-25 NOTE — Telephone Encounter (Signed)
Faxed approval for [facility] per Cira Rue NP to [facility] in [place]  Phone#[phone] Fax# [phone]  Fax came back approved and placed in bin beside nurse desk/

## 2019-08-26 ENCOUNTER — Non-Acute Institutional Stay: Payer: Medicare Other | Admitting: Primary Care

## 2019-08-26 ENCOUNTER — Other Ambulatory Visit: Payer: Self-pay

## 2019-08-27 NOTE — Telephone Encounter (Signed)
Spoke with POA. Advised since living at [facility], need to clarify if Dr Nicki Reaper or Dr Lovie Macadamia will be following patient and also advised that I had not given any orders for labs. POA stated she is going to clarify and let me know.

## 2019-08-27 NOTE — Telephone Encounter (Signed)
POA called back returning your call

## 2019-08-28 ENCOUNTER — Non-Acute Institutional Stay: Payer: Medicare Other | Admitting: Primary Care

## 2019-08-28 ENCOUNTER — Other Ambulatory Visit: Payer: Self-pay

## 2019-08-28 DIAGNOSIS — Z515 Encounter for palliative care: Secondary | ICD-10-CM | POA: Diagnosis not present

## 2019-08-28 NOTE — Progress Notes (Signed)
  [facility] Consult Note Telephone: [phone]  Fax: [phone]  PATIENT NAME: Philip Gordon [address] [place] [address] [phone] (home)  DOB: 06/07/1950 MRN: [id number]  PRIMARY CARE PROVIDER:    Bronstein, David, MD,  [address] [place] [address] [phone]  REFERRING PROVIDER:   Bronstein, David, MD [address] [place],  [place] [address] [phone]  RESPONSIBLE PARTY:   Extended Emergency Contact Information Primary Emergency Contact: ,Philip Gordon Address: [address]          [place], [place] [address] [place] Home Phone: [phone] Relation: Other Secondary Emergency Contact: ,Philip Gordon  [place] Home Phone: [phone] Relation: Friend  I met with patient and family in the facility.  ASSESSMENT AND RECOMMENDATIONS:   1. Advance Care Planning/Goals of Care: Goals include to maximize quality of life and symptom management. Most on file at the skilled facility with DNR comfort measures limited antibiotics and IV no feeding tube.   2. Symptom Management:   Discussed weakness and therapy. States he no longer gets PT. He wanted more PT but apparently has plateaued. This was explained to him.  Asked Philip Gordon to f/u with request for restorative therapy. Patient liked working on equipment and would like to keep up his strength. T/c to POA who also states he would like to be more active but often does not take initiative. His appetite appears good for food brought by family. We discussed his care needs and level of care.   3. Family /Caregiver/Community Supports: Many cousins, family helping. Lives in LTC. POA asked if this was the best care level. In light of his progressive disease it appears so.  4. Cognitive / Functional decline: A and O x 2-3. Forgetful.   5. Follow up Palliative Care Visit: Palliative care will continue to follow for goals of care  clarification and symptom management. Return 4-6 weeks or prn.  I spent 35 minutes providing this consultation,  from 1100 to 1135. More than 50% of the time in this consultation was spent coordinating communication.   HISTORY OF PRESENT ILLNESS:  Philip Gordon is a 69 y.o. year old male with multiple medical problems including MS, depression, dysarthria. Palliative Care was asked to follow this patient by consultation request of Gordon, David, MD to help address advance care planning and goals of care. This is a follow up visit.  CODE STATUS: Most on file at the skilled facility with DNR comfort measures limited antibiotics and IV no feeding tube.   PPS: 30%  HOSPICE ELIGIBILITY/DIAGNOSIS: TBD  PAST MEDICAL HISTORY:  Past Medical History:  Diagnosis Date  . Depression   . Hypercholesterolemia   . Hypertension   . Multiple sclerosis (HCC)    Sees Dr Philip Gordon ([facility])  . Prostate cancer (HCC)    Followed by Dr Philip Gordon and Dr Philip Gordon    SOCIAL HX:  Social History   Tobacco Use  . Smoking status: Never Smoker  . Smokeless tobacco: Current User    Types: Chew  . Tobacco comment: Is going to try and cut down on the amount.   Substance Use Topics  . Alcohol use: No    Alcohol/week: 2.0 standard drinks    Types: 2 Cans of beer per week    ALLERGIES: No Known Allergies   PERTINENT MEDICATIONS:  Outpatient Encounter Medications as of 08/28/2019  Medication Sig  . diphenhydrAMINE (BENADRYL) 25 mg capsule Take 25 mg by   mouth at bedtime as needed.   . donepezil (ARICEPT) 10 MG tablet Take 10 mg by mouth at bedtime.   Marland Kitchen ezetimibe (ZETIA) 10 MG tablet Take 1 tablet (10 mg total) by mouth daily.  . finasteride (PROSCAR) 5 MG tablet Take 1 tablet (5 mg total) by mouth daily.  . fluticasone (FLONASE) 50 MCG/ACT nasal spray Place 2 sprays into both nostrils daily.  Marland Kitchen gabapentin (NEURONTIN) 100 MG capsule Take 100 mg by mouth 3 (three) times daily.  . mirtazapine (REMERON) 15 MG  tablet Take 15 mg by mouth at bedtime.  . Multiple Vitamin (MULTIVITAMIN) capsule Take 1 capsule by mouth daily.  Marland Kitchen POTASSIUM PO Take 99 mg by mouth daily.  . Probiotic Product (PROBIOTIC-10 PO) Take by mouth.  . sertraline (ZOLOFT) 25 MG tablet Take 1 tablet (25 mg total) by mouth daily.  Marland Kitchen tiZANidine (ZANAFLEX) 4 MG tablet TAKE 1 TABLET BY MOUTH NIGHTLY (Patient taking differently: Take 4 mg by mouth at bedtime. )  . vitamin B-12 (CYANOCOBALAMIN) 1000 MCG tablet Take 1 tablet (1,000 mcg total) by mouth daily.   No facility-administered encounter medications on file as of 08/28/2019.    PHYSICAL EXAM / ROS:   Current and past weights: current weight is 151 lbs, states 143 lbs but has gained General: NAD, frail appearing, thin Cardiovascular: no chest pain reported, no edema  Pulmonary: no cough, no increased SOB, room air Abdomen: appetite good,denies constipation, continent of bowel GU: denies dysuria, continent of urine MSK:  no joint and ROM abnormalities, ambulatory with assistance Skin: no rashes or wounds reported Neurological: Weakness of gait, forgetful, gross motor stiffness, dysarthria (improved today)  Philip Coop, NP [facility]  COVID-19 PATIENT SCREENING TOOL  Person answering questions: ____________Staff_______ _____   1.  Is the patient or any family member in the home showing any signs or symptoms regarding respiratory infection?               Person with Symptom- __________NA_________________  a. Fever                                                                          Yes___ No___          ___________________  b. Shortness of breath                                                    Yes___ No___          ___________________ c. Cough/congestion                                       Yes___  No___         ___________________ d. Body aches/pains  Yes___ No___         ____________________ e. Gastrointestinal symptoms (diarrhea, nausea)           Yes___ No___        ____________________  2. Within the past 14 days, has anyone living in the home had any contact with someone with or under investigation for COVID-19?    Yes___ No_X_   Person __________________   

## 2019-09-03 ENCOUNTER — Other Ambulatory Visit: Payer: Self-pay

## 2019-09-03 ENCOUNTER — Ambulatory Visit (INDEPENDENT_AMBULATORY_CARE_PROVIDER_SITE_OTHER): Payer: Medicare Other | Admitting: Urology

## 2019-09-03 ENCOUNTER — Encounter: Payer: Self-pay | Admitting: Urology

## 2019-09-03 VITALS — BP 148/70 | HR 67 | Ht 69.0 in | Wt 138.0 lb

## 2019-09-03 DIAGNOSIS — Z8546 Personal history of malignant neoplasm of prostate: Secondary | ICD-10-CM

## 2019-09-03 MED ORDER — FINASTERIDE 5 MG PO TABS: 5.0000 mg | ORAL_TABLET | Freq: Every day | ORAL | 3 refills | Status: AC

## 2019-09-03 NOTE — Progress Notes (Signed)
09/03/2019 1:20 PM   Philip Gordon 03/11/1951 [id number]  Referring provider: Einar Pheasant, MD [address] [place],  [place] [address]  Chief Complaint  Patient presents with  . Prostate Cancer    Urologic history: 1. T1c adenocarcinoma the prostate low risk -Prostate biopsy January 2011; PSA 6.3; Gleason 3+3 adenocarcinoma 3 cores -Treated prostate brachytherapy May 2011  HPI: 69 yo male presents for follow-up of prostate cancer.  I last saw him in October 2019.  Last PSA on chart review was May 2019 which was 0.02 he has no bothersome lower urinary tract symptoms.  Denies dysuria or gross hematuria.  He remains on finasteride.   PMH: Past Medical History:  Diagnosis Date  . Depression   . Hypercholesterolemia   . Hypertension   . Multiple sclerosis (HCC)    Sees Dr Ala Bent [facility])  . Prostate cancer [facility])    Followed by Dr [name] and Dr [name]    Surgical History: Past Surgical History:  Procedure Laterality Date  . KNEE SURGERY      Home Medications:  Allergies as of 09/03/2019      Reactions   Gabapentin    Other reaction(s): Dizziness   Pregabalin    Other reaction(s): Dizziness      Medication List       Accurate as of September 03, 2019  1:20 PM. If you have any questions, ask your nurse or doctor.        diphenhydrAMINE 25 mg capsule Commonly known as: BENADRYL Take 25 mg by mouth at bedtime as needed.   donepezil 10 MG tablet Commonly known as: ARICEPT Take 10 mg by mouth at bedtime.   ezetimibe 10 MG tablet Commonly known as: ZETIA Take 1 tablet (10 mg total) by mouth daily.   finasteride 5 MG tablet Commonly known as: PROSCAR Take 1 tablet (5 mg total) by mouth daily.   fluticasone 50 MCG/ACT nasal spray Commonly known as: FLONASE Place 2 sprays into both nostrils daily.   gabapentin 100 MG capsule Commonly known as: NEURONTIN Take 100 mg by mouth 3 (three) times daily.   mirtazapine 15 MG  tablet Commonly known as: REMERON Take 15 mg by mouth at bedtime.   multivitamin capsule Take 1 capsule by mouth daily.   POTASSIUM PO Take 99 mg by mouth daily.   PROBIOTIC-10 PO Take by mouth.   sertraline 25 MG tablet Commonly known as: ZOLOFT Take 1 tablet (25 mg total) by mouth daily.   tiZANidine 4 MG tablet Commonly known as: ZANAFLEX TAKE 1 TABLET BY MOUTH NIGHTLY   vitamin B-12 1000 MCG tablet Commonly known as: CYANOCOBALAMIN Take 1 tablet (1,000 mcg total) by mouth daily.       Allergies:  Allergies  Allergen Reactions  . Gabapentin     Other reaction(s): Dizziness  . Pregabalin     Other reaction(s): Dizziness    Family History: Family History  Problem Relation Age of Onset  . Hypertension Mother   . Stroke Father   . Diabetes Maternal Grandmother   . Diabetes Maternal Grandfather   . Leukemia Sister        died - age 71    Social History:  reports that he has never smoked. His smokeless tobacco use includes chew. He reports that he does not drink alcohol and does not use drugs.   Physical Exam: BP (!) 148/70   Pulse 67   Ht 5\' 9"  (1.753 m)   Wt 138 lb (  62.6 kg)   BMI 20.38 kg/m   Constitutional:  Alert and oriented, No acute distress. HEENT: [place] AT, moist mucus membranes.  Trachea midline, no masses. Cardiovascular: No clubbing, cyanosis, or edema. Respiratory: Normal respiratory effort, no increased work of breathing. Psychiatric: Normal mood and affect.   Assessment & Plan:    History T1c prostate cancer status post brachytherapy -Stable -PSA drawn today and will be notified with results -Finasteride refilled -Continue annual follow-up   Abbie Sons, MD  [place] [address], [place], [place] [address] [phone]

## 2019-09-04 ENCOUNTER — Telehealth: Payer: Self-pay | Admitting: *Deleted

## 2019-09-04 LAB — PSA: Prostate Specific Ag, Serum: <0.1 ng/mL (ref 0.0–4.0)

## 2019-09-04 NOTE — Telephone Encounter (Signed)
-----   Message from Abbie Sons, MD sent at 09/04/2019 12:50 PM EDT ----- PSA was undetectable at <0.1

## 2019-09-04 NOTE — Telephone Encounter (Signed)
Talk to POA Notified  instructed, patient pleased

## 2019-09-24 DIAGNOSIS — F3289 Other specified depressive episodes: Secondary | ICD-10-CM | POA: Diagnosis not present

## 2019-09-24 DIAGNOSIS — F0150 Vascular dementia without behavioral disturbance: Secondary | ICD-10-CM | POA: Diagnosis not present

## 2019-09-24 DIAGNOSIS — I10 Essential (primary) hypertension: Secondary | ICD-10-CM | POA: Diagnosis not present

## 2019-09-24 DIAGNOSIS — E785 Hyperlipidemia, unspecified: Secondary | ICD-10-CM | POA: Diagnosis not present

## 2019-09-24 DIAGNOSIS — G35 Multiple sclerosis: Secondary | ICD-10-CM | POA: Diagnosis not present

## 2019-09-28 DIAGNOSIS — F0150 Vascular dementia without behavioral disturbance: Secondary | ICD-10-CM | POA: Diagnosis not present

## 2019-09-28 DIAGNOSIS — E785 Hyperlipidemia, unspecified: Secondary | ICD-10-CM | POA: Diagnosis not present

## 2019-09-28 DIAGNOSIS — G35 Multiple sclerosis: Secondary | ICD-10-CM | POA: Diagnosis not present

## 2019-09-28 DIAGNOSIS — F3289 Other specified depressive episodes: Secondary | ICD-10-CM | POA: Diagnosis not present

## 2019-09-28 DIAGNOSIS — I10 Essential (primary) hypertension: Secondary | ICD-10-CM | POA: Diagnosis not present

## 2019-09-29 DIAGNOSIS — I10 Essential (primary) hypertension: Secondary | ICD-10-CM | POA: Diagnosis not present

## 2019-09-29 DIAGNOSIS — E785 Hyperlipidemia, unspecified: Secondary | ICD-10-CM | POA: Diagnosis not present

## 2019-09-29 DIAGNOSIS — G35 Multiple sclerosis: Secondary | ICD-10-CM | POA: Diagnosis not present

## 2019-09-29 DIAGNOSIS — F3289 Other specified depressive episodes: Secondary | ICD-10-CM | POA: Diagnosis not present

## 2019-09-29 DIAGNOSIS — F0150 Vascular dementia without behavioral disturbance: Secondary | ICD-10-CM | POA: Diagnosis not present

## 2019-09-30 DIAGNOSIS — I10 Essential (primary) hypertension: Secondary | ICD-10-CM | POA: Diagnosis not present

## 2019-09-30 DIAGNOSIS — F0150 Vascular dementia without behavioral disturbance: Secondary | ICD-10-CM | POA: Diagnosis not present

## 2019-09-30 DIAGNOSIS — G35 Multiple sclerosis: Secondary | ICD-10-CM | POA: Diagnosis not present

## 2019-09-30 DIAGNOSIS — E785 Hyperlipidemia, unspecified: Secondary | ICD-10-CM | POA: Diagnosis not present

## 2019-09-30 DIAGNOSIS — F3289 Other specified depressive episodes: Secondary | ICD-10-CM | POA: Diagnosis not present

## 2019-10-02 DIAGNOSIS — F0150 Vascular dementia without behavioral disturbance: Secondary | ICD-10-CM | POA: Diagnosis not present

## 2019-10-02 DIAGNOSIS — I10 Essential (primary) hypertension: Secondary | ICD-10-CM | POA: Diagnosis not present

## 2019-10-02 DIAGNOSIS — E785 Hyperlipidemia, unspecified: Secondary | ICD-10-CM | POA: Diagnosis not present

## 2019-10-02 DIAGNOSIS — F3289 Other specified depressive episodes: Secondary | ICD-10-CM | POA: Diagnosis not present

## 2019-10-02 DIAGNOSIS — G35 Multiple sclerosis: Secondary | ICD-10-CM | POA: Diagnosis not present

## 2019-10-05 ENCOUNTER — Non-Acute Institutional Stay: Payer: Medicare Other | Admitting: Primary Care

## 2019-10-05 ENCOUNTER — Other Ambulatory Visit: Payer: Self-pay

## 2019-10-05 DIAGNOSIS — E785 Hyperlipidemia, unspecified: Secondary | ICD-10-CM | POA: Diagnosis not present

## 2019-10-05 DIAGNOSIS — Z515 Encounter for palliative care: Secondary | ICD-10-CM | POA: Diagnosis not present

## 2019-10-05 DIAGNOSIS — G35 Multiple sclerosis: Secondary | ICD-10-CM | POA: Diagnosis not present

## 2019-10-05 DIAGNOSIS — I10 Essential (primary) hypertension: Secondary | ICD-10-CM | POA: Diagnosis not present

## 2019-10-05 DIAGNOSIS — F3289 Other specified depressive episodes: Secondary | ICD-10-CM | POA: Diagnosis not present

## 2019-10-05 DIAGNOSIS — F0150 Vascular dementia without behavioral disturbance: Secondary | ICD-10-CM | POA: Diagnosis not present

## 2019-10-05 NOTE — Progress Notes (Signed)
[facility] Consult Note Telephone: [phone]  Fax: [phone]  PATIENT NAME: Philip Gordon [place] [address] [phone] (home)  DOB: [birth year] MRN: [id number]  PRIMARY CARE PROVIDER:    Juluis Pitch, MD,  [address] [place] [address] [phone]  REFERRING PROVIDER:   Juluis Pitch, MD [address] [place],  [place] [address] [phone]  RESPONSIBLE PARTY:   Extended Emergency Contact Information Primary Emergency Contact: Jasreet Dickie,[name] Address: [address]          [place], [place] [address] [place] Phone: [phone] Relation: Other Secondary Emergency Contact: Cory Kitt,Shirley  [place] Phone: [phone] Relation: Friend  I met with Mr. Kindt in his facility.  ASSESSMENT AND RECOMMENDATIONS:   1. Advance Care Planning/Goals of Care: Goals include to maximize quality of life and symptom management. Current MOST Has DNR, comfort measures limited abx and fluid use, no feeding tube,   Signed by his POA. We will continue to explore.   2. Symptom Management:      Nutrition: He was finishing lunch. Sometimes his cousins will bring him food in. He states they visit every day. Stable weight. He denies new issues.   Mobility: He denies falls. He states that he would like to walk more and have someone for standby and questioned if there was a volunteer program or something similar where by people could walk with him. He has a Rollator walker and is trying to exercise to maintain his strength. Goes to the SNF gym for some exercises.We discussed muscle and function loss in progressing MS.  Mood: Overall hes doing well at the nursing home. He does find some of the  people crying out at night disturbing. We discussed the fact that many people there had dementia and that often they were confused. Mr. Lynelle Doctor is alert and oriented times x3. He talked  about places hed visited in the past such as [place]. He may benefit from some activities etc. or some outings if permitted.  3. Family /Caregiver/Community Supports: Cousin is POA, lives in LTC due to limitations of MS.   4. Cognitive / Functional decline: A and O x 3 although he endorses better long term than short term memory. Some dysarthria. Needs assistance with adls, iadls.  5. Follow up Palliative Care Visit: Palliative care will continue to follow for goals of care clarification and symptom management. Return 4 weeks or prn.  I spent 35 minutes providing this consultation,  from 1100 to 1135. More than 50% of the time in this consultation was spent coordinating communication.   Chief Complaint: Deficits in ambulation, fall risk. Seeks to improve strength and stamina.  HISTORY OF PRESENT ILLNESS:  Philip Gordon is a 69 y.o. year old male with multiple medical problems including MS, abnormality of gait. Palliative Care was asked to follow this patient by consultation request of Juluis Pitch, MD to help address advance care planning and goals of care. This is a follow up visit.  CODE STATUS: DN R  PPS: 50%  HOSPICE ELIGIBILITY/DIAGNOSIS: no  PAST MEDICAL HISTORY:  Past Medical History:  Diagnosis Date   Depression    Hypercholesterolemia    Hypertension    Multiple sclerosis (HCC)    Sees Dr Ala Bent [facility])   Prostate cancer [facility])    Followed by Dr [name] and Dr [name]    SOCIAL HX:  Social History   Tobacco Use   Smoking status:  Never Smoker   Smokeless tobacco: Current User    Types: Chew   Tobacco comment: Is going to try and cut down on the amount.   Substance Use Topics   Alcohol use: No    Alcohol/week: 2.0 standard drinks    Types: 2 Cans of beer per week    ALLERGIES:  Allergies  Allergen Reactions   Gabapentin     Other reaction(s): Dizziness   Pregabalin     Other reaction(s): Dizziness     PERTINENT MEDICATIONS:  Outpatient  Encounter Medications as of 10/05/2019  Medication Sig   acetaminophen (TYLENOL) 325 MG tablet Take by mouth every 6 (six) hours as needed. As needed for pain   acidophilus (RISAQUAD) CAPS capsule Take 1 capsule by mouth daily.   Cholecalciferol 250 MCG (10000 UT) TABS Take 1 tablet by mouth daily.   diphenhydrAMINE (BENADRYL) 25 mg capsule Take 25 mg by mouth at bedtime as needed.    DULoxetine (CYMBALTA) 20 MG capsule Take 20 mg by mouth daily.   ezetimibe (ZETIA) 10 MG tablet Take 1 tablet (10 mg total) by mouth daily.   ferrous sulfate 325 (65 FE) MG tablet Take 325 mg by mouth daily with breakfast.   finasteride (PROSCAR) 5 MG tablet Take 1 tablet (5 mg total) by mouth daily.   fluticasone (FLONASE) 50 MCG/ACT nasal spray Place 2 sprays into both nostrils daily.   mirtazapine (REMERON) 15 MG tablet Take 15 mg by mouth at bedtime.   Multiple Vitamin (MULTIVITAMIN) capsule Take 1 capsule by mouth daily.   Polyethyl Glycol-Propyl Glycol (SYSTANE) 0.4-0.3 % SOLN Apply 1 drop to eye in the morning and at bedtime. In both eyes (OU) as needed for dry eyes.   sertraline (ZOLOFT) 25 MG tablet Take 1 tablet (25 mg total) by mouth daily.   tiZANidine (ZANAFLEX) 4 MG tablet TAKE 1 TABLET BY MOUTH NIGHTLY (Patient taking differently: Take 4 mg by mouth at bedtime. )   vitamin B-12 (CYANOCOBALAMIN) 1000 MCG tablet Take 1 tablet (1,000 mcg total) by mouth daily.   [DISCONTINUED] donepezil (ARICEPT) 10 MG tablet Take 10 mg by mouth at bedtime.    No facility-administered encounter medications on file as of 10/05/2019.   PHYSICAL EXAM / ROS:   Current and past weights: 150 lbs, stable General: NAD, frail appearing,WNWD, chewing tobacco. Cardiovascular: no chest pain reported, no LE edema  Pulmonary: no cough, no increased SOB, room air Abdomen: appetite fair, better with take out,  continent of bowel GU: denies dysuria, continent of urine MSK:  ++ joint and ROM abnormalities,  ambulatory with walker, denies falls Skin: no rashes or wounds reported Neurological: Weakness, MS deficits, dysarthria, walks to bathroom with walker,   Jason Coop, NP [facility]  COVID-19 PATIENT SCREENING TOOL  Person answering questions: ____________Staff_______ _____   1.  Is the patient or any family member in the home showing any signs or symptoms regarding respiratory infection?               Person with Symptom- __________NA_________________  a. Fever                                                                          Yes___ No___  ___________________  b. Shortness of breath                                                    Yes___ No___          ___________________ c. Cough/congestion                                       Yes___  No___         ___________________ d. Body aches/pains                                                         Yes___ No___        ____________________ e. Gastrointestinal symptoms (diarrhea, nausea)           Yes___ No___        ____________________  2. Within the past 14 days, has anyone living in the home had any contact with someone with or under investigation for COVID-19?    Yes___ No_X_   Person __________________

## 2019-10-06 DIAGNOSIS — G35 Multiple sclerosis: Secondary | ICD-10-CM | POA: Diagnosis not present

## 2019-10-06 DIAGNOSIS — F0150 Vascular dementia without behavioral disturbance: Secondary | ICD-10-CM | POA: Diagnosis not present

## 2019-10-06 DIAGNOSIS — F3289 Other specified depressive episodes: Secondary | ICD-10-CM | POA: Diagnosis not present

## 2019-10-06 DIAGNOSIS — E785 Hyperlipidemia, unspecified: Secondary | ICD-10-CM | POA: Diagnosis not present

## 2019-10-06 DIAGNOSIS — I10 Essential (primary) hypertension: Secondary | ICD-10-CM | POA: Diagnosis not present

## 2019-10-07 DIAGNOSIS — F3289 Other specified depressive episodes: Secondary | ICD-10-CM | POA: Diagnosis not present

## 2019-10-07 DIAGNOSIS — F0150 Vascular dementia without behavioral disturbance: Secondary | ICD-10-CM | POA: Diagnosis not present

## 2019-10-07 DIAGNOSIS — G35 Multiple sclerosis: Secondary | ICD-10-CM | POA: Diagnosis not present

## 2019-10-07 DIAGNOSIS — I10 Essential (primary) hypertension: Secondary | ICD-10-CM | POA: Diagnosis not present

## 2019-10-07 DIAGNOSIS — E785 Hyperlipidemia, unspecified: Secondary | ICD-10-CM | POA: Diagnosis not present

## 2019-10-08 DIAGNOSIS — F0150 Vascular dementia without behavioral disturbance: Secondary | ICD-10-CM | POA: Diagnosis not present

## 2019-10-08 DIAGNOSIS — F3289 Other specified depressive episodes: Secondary | ICD-10-CM | POA: Diagnosis not present

## 2019-10-08 DIAGNOSIS — G35 Multiple sclerosis: Secondary | ICD-10-CM | POA: Diagnosis not present

## 2019-10-08 DIAGNOSIS — I10 Essential (primary) hypertension: Secondary | ICD-10-CM | POA: Diagnosis not present

## 2019-10-08 DIAGNOSIS — E785 Hyperlipidemia, unspecified: Secondary | ICD-10-CM | POA: Diagnosis not present

## 2019-10-09 DIAGNOSIS — F0150 Vascular dementia without behavioral disturbance: Secondary | ICD-10-CM | POA: Diagnosis not present

## 2019-10-09 DIAGNOSIS — G35 Multiple sclerosis: Secondary | ICD-10-CM | POA: Diagnosis not present

## 2019-10-09 DIAGNOSIS — F3289 Other specified depressive episodes: Secondary | ICD-10-CM | POA: Diagnosis not present

## 2019-10-09 DIAGNOSIS — I10 Essential (primary) hypertension: Secondary | ICD-10-CM | POA: Diagnosis not present

## 2019-10-09 DIAGNOSIS — E785 Hyperlipidemia, unspecified: Secondary | ICD-10-CM | POA: Diagnosis not present

## 2019-10-12 DIAGNOSIS — E785 Hyperlipidemia, unspecified: Secondary | ICD-10-CM | POA: Diagnosis not present

## 2019-10-12 DIAGNOSIS — I10 Essential (primary) hypertension: Secondary | ICD-10-CM | POA: Diagnosis not present

## 2019-10-12 DIAGNOSIS — G35 Multiple sclerosis: Secondary | ICD-10-CM | POA: Diagnosis not present

## 2019-10-12 DIAGNOSIS — F3289 Other specified depressive episodes: Secondary | ICD-10-CM | POA: Diagnosis not present

## 2019-10-12 DIAGNOSIS — F0150 Vascular dementia without behavioral disturbance: Secondary | ICD-10-CM | POA: Diagnosis not present

## 2019-10-13 DIAGNOSIS — F3289 Other specified depressive episodes: Secondary | ICD-10-CM | POA: Diagnosis not present

## 2019-10-13 DIAGNOSIS — E785 Hyperlipidemia, unspecified: Secondary | ICD-10-CM | POA: Diagnosis not present

## 2019-10-13 DIAGNOSIS — F0150 Vascular dementia without behavioral disturbance: Secondary | ICD-10-CM | POA: Diagnosis not present

## 2019-10-13 DIAGNOSIS — G35 Multiple sclerosis: Secondary | ICD-10-CM | POA: Diagnosis not present

## 2019-10-13 DIAGNOSIS — I10 Essential (primary) hypertension: Secondary | ICD-10-CM | POA: Diagnosis not present

## 2019-10-14 DIAGNOSIS — E785 Hyperlipidemia, unspecified: Secondary | ICD-10-CM | POA: Diagnosis not present

## 2019-10-14 DIAGNOSIS — F0150 Vascular dementia without behavioral disturbance: Secondary | ICD-10-CM | POA: Diagnosis not present

## 2019-10-14 DIAGNOSIS — G35 Multiple sclerosis: Secondary | ICD-10-CM | POA: Diagnosis not present

## 2019-10-14 DIAGNOSIS — F3289 Other specified depressive episodes: Secondary | ICD-10-CM | POA: Diagnosis not present

## 2019-10-14 DIAGNOSIS — D508 Other iron deficiency anemias: Secondary | ICD-10-CM | POA: Diagnosis not present

## 2019-10-14 DIAGNOSIS — I10 Essential (primary) hypertension: Secondary | ICD-10-CM | POA: Diagnosis not present

## 2019-10-15 DIAGNOSIS — E785 Hyperlipidemia, unspecified: Secondary | ICD-10-CM | POA: Diagnosis not present

## 2019-10-15 DIAGNOSIS — G35 Multiple sclerosis: Secondary | ICD-10-CM | POA: Diagnosis not present

## 2019-10-15 DIAGNOSIS — F0150 Vascular dementia without behavioral disturbance: Secondary | ICD-10-CM | POA: Diagnosis not present

## 2019-10-15 DIAGNOSIS — I10 Essential (primary) hypertension: Secondary | ICD-10-CM | POA: Diagnosis not present

## 2019-10-15 DIAGNOSIS — F3289 Other specified depressive episodes: Secondary | ICD-10-CM | POA: Diagnosis not present

## 2019-10-16 DIAGNOSIS — I10 Essential (primary) hypertension: Secondary | ICD-10-CM | POA: Diagnosis not present

## 2019-10-16 DIAGNOSIS — F0150 Vascular dementia without behavioral disturbance: Secondary | ICD-10-CM | POA: Diagnosis not present

## 2019-10-16 DIAGNOSIS — F3289 Other specified depressive episodes: Secondary | ICD-10-CM | POA: Diagnosis not present

## 2019-10-16 DIAGNOSIS — G35 Multiple sclerosis: Secondary | ICD-10-CM | POA: Diagnosis not present

## 2019-10-16 DIAGNOSIS — E785 Hyperlipidemia, unspecified: Secondary | ICD-10-CM | POA: Diagnosis not present

## 2019-10-19 DIAGNOSIS — E785 Hyperlipidemia, unspecified: Secondary | ICD-10-CM | POA: Diagnosis not present

## 2019-10-19 DIAGNOSIS — F0150 Vascular dementia without behavioral disturbance: Secondary | ICD-10-CM | POA: Diagnosis not present

## 2019-10-19 DIAGNOSIS — I10 Essential (primary) hypertension: Secondary | ICD-10-CM | POA: Diagnosis not present

## 2019-10-19 DIAGNOSIS — F3289 Other specified depressive episodes: Secondary | ICD-10-CM | POA: Diagnosis not present

## 2019-10-19 DIAGNOSIS — G35 Multiple sclerosis: Secondary | ICD-10-CM | POA: Diagnosis not present

## 2019-10-20 DIAGNOSIS — F3289 Other specified depressive episodes: Secondary | ICD-10-CM | POA: Diagnosis not present

## 2019-10-20 DIAGNOSIS — I10 Essential (primary) hypertension: Secondary | ICD-10-CM | POA: Diagnosis not present

## 2019-10-20 DIAGNOSIS — E785 Hyperlipidemia, unspecified: Secondary | ICD-10-CM | POA: Diagnosis not present

## 2019-10-20 DIAGNOSIS — G35 Multiple sclerosis: Secondary | ICD-10-CM | POA: Diagnosis not present

## 2019-10-20 DIAGNOSIS — F0150 Vascular dementia without behavioral disturbance: Secondary | ICD-10-CM | POA: Diagnosis not present

## 2019-10-21 DIAGNOSIS — F0150 Vascular dementia without behavioral disturbance: Secondary | ICD-10-CM | POA: Diagnosis not present

## 2019-10-21 DIAGNOSIS — E785 Hyperlipidemia, unspecified: Secondary | ICD-10-CM | POA: Diagnosis not present

## 2019-10-21 DIAGNOSIS — I10 Essential (primary) hypertension: Secondary | ICD-10-CM | POA: Diagnosis not present

## 2019-10-21 DIAGNOSIS — G35 Multiple sclerosis: Secondary | ICD-10-CM | POA: Diagnosis not present

## 2019-10-21 DIAGNOSIS — F3289 Other specified depressive episodes: Secondary | ICD-10-CM | POA: Diagnosis not present

## 2019-10-22 DIAGNOSIS — E785 Hyperlipidemia, unspecified: Secondary | ICD-10-CM | POA: Diagnosis not present

## 2019-10-22 DIAGNOSIS — F0150 Vascular dementia without behavioral disturbance: Secondary | ICD-10-CM | POA: Diagnosis not present

## 2019-10-22 DIAGNOSIS — I10 Essential (primary) hypertension: Secondary | ICD-10-CM | POA: Diagnosis not present

## 2019-10-22 DIAGNOSIS — G35 Multiple sclerosis: Secondary | ICD-10-CM | POA: Diagnosis not present

## 2019-10-22 DIAGNOSIS — F3289 Other specified depressive episodes: Secondary | ICD-10-CM | POA: Diagnosis not present

## 2019-10-27 DIAGNOSIS — R41840 Attention and concentration deficit: Secondary | ICD-10-CM | POA: Diagnosis not present

## 2019-10-27 DIAGNOSIS — M4712 Other spondylosis with myelopathy, cervical region: Secondary | ICD-10-CM | POA: Diagnosis not present

## 2019-10-27 DIAGNOSIS — E559 Vitamin D deficiency, unspecified: Secondary | ICD-10-CM | POA: Diagnosis not present

## 2019-10-27 DIAGNOSIS — E785 Hyperlipidemia, unspecified: Secondary | ICD-10-CM | POA: Diagnosis not present

## 2019-10-27 DIAGNOSIS — F329 Major depressive disorder, single episode, unspecified: Secondary | ICD-10-CM | POA: Diagnosis not present

## 2019-10-27 DIAGNOSIS — N400 Enlarged prostate without lower urinary tract symptoms: Secondary | ICD-10-CM | POA: Diagnosis not present

## 2019-10-27 DIAGNOSIS — G35 Multiple sclerosis: Secondary | ICD-10-CM | POA: Diagnosis not present

## 2019-10-27 DIAGNOSIS — J302 Other seasonal allergic rhinitis: Secondary | ICD-10-CM | POA: Diagnosis not present

## 2019-10-27 DIAGNOSIS — M6281 Muscle weakness (generalized): Secondary | ICD-10-CM | POA: Diagnosis not present

## 2019-11-11 ENCOUNTER — Telehealth: Payer: Self-pay | Admitting: Family Medicine

## 2019-11-11 NOTE — Telephone Encounter (Signed)
Pt's nurse call request medical certification for patient to be moved from [facility] to [facility]

## 2019-11-11 NOTE — Telephone Encounter (Signed)
Enid Derry called requesting a medical certificate from [place] for patient to be moved from [facility] to [facility].

## 2019-11-12 NOTE — Telephone Encounter (Signed)
Advised to [place] that patient is no longer a patient of Dr Lars Mage and she cannot sign any kind of forms. He is being followed by Dr Lovie Macadamia. Patient stated that she is bringing the form up here to drop off so we can look over. Patient asked to meet with Dr Nicki Reaper for 5 minutes regarding this. Advised that Dr Nicki Reaper is seeing patients all afternoon. Will review form once dropped off.

## 2019-11-12 NOTE — Telephone Encounter (Signed)
Philip Gordon called office back, please call her back, [phone].

## 2019-11-17 NOTE — Telephone Encounter (Signed)
Philip Gordon called back returning your call

## 2019-11-18 ENCOUNTER — Non-Acute Institutional Stay: Payer: Medicare Other | Admitting: Primary Care

## 2019-11-18 ENCOUNTER — Other Ambulatory Visit: Payer: Self-pay

## 2019-11-18 DIAGNOSIS — G35 Multiple sclerosis: Secondary | ICD-10-CM

## 2019-11-18 DIAGNOSIS — Z515 Encounter for palliative care: Secondary | ICD-10-CM | POA: Diagnosis not present

## 2019-11-18 NOTE — Telephone Encounter (Signed)
Philip Gordon has picked up

## 2019-11-18 NOTE — Progress Notes (Signed)
[facility] Consult Note Telephone: [phone]  Fax: [phone]  PATIENT NAME: Philip Gordon [id number] [phone] (home)  DOB: 1950/08/12 MRN: [id number]  PRIMARY CARE PROVIDER:    Juluis Pitch, MD,  [address] [place] [address] [phone]  REFERRING PROVIDER:   Juluis Pitch, MD [address] [place],  [place] [address] [phone]  RESPONSIBLE PARTY:   Extended Emergency Contact Information Primary Emergency Contact: Philip Gordon,Philip Address: [address]          [place], [place] [address] [place] Phone: [phone] Relation: Other Secondary Emergency Contact: Philip Gordon,Philip  [place] Phone: [phone] Relation: Friend  I met face to face with patient in facility.  ASSESSMENT AND RECOMMENDATIONS:    1. Advance Care Planning/Goals of Care: Goals include to maximize quality of life and symptom management. Current MOST Has DNR, comfort measures limited abx and fluid use, no feeding tube,   Signed by his POA. We will continue to explore.  2. Symptom Management:  I met with Philip Gordon in his nursing home room. He was sitting up today; he states no problems. He does have dislike of the food and states hes not eating as well. His family will bring outside food which he enjoys especially cookout milkshakes. He denies pain or decreased mobility. He endorses using his walker and being able to get around.   I note a little more slurred speech today I will continue to monitor for signs of disease progression. He states he would like to move bed to the other side of the room where there is window and a little more space once his current roommate is discharged tomorrow. I encouraged him to let staff know this request.   I feel Philip Gordon is at a successful level of care now that he is living in the long-term care facility. His medical issues  have remained stable and his care provision and monitoring is optimized. I do not think he needs to be followed monthly in the managed care program and so will decreasing his visits and discharge him from the [facility] program to periodic Palliative management.   3. Follow up Palliative Care Visit: Palliative care will continue to follow for goals of care clarification and symptom management. Return 8 weeks or prn.  4. Family /Caregiver/Community Supports: Has cousin who is poa, lives in [place]  5. Cognitive / Functional decline:  A and O x 2-3, Needs assistance with adls, ialds.  I spent 35 minutes providing this consultation,  from 1430 to 1505. More than 50% of the time in this consultation was spent coordinating communication.   CHIEF COMPLAINT: nutritional intake, fall risk  HISTORY OF PRESENT ILLNESS:  Philip Gordon is a 69 y.o. year old male with multiple medical problems including MS, depression. Palliative Care was asked to follow this patient by consultation request of Philip Pitch, MD to help address advance care planning and goals of care. This is a follow up visit.  CODE STATUS:  DNR  PPS: 60%  HOSPICE ELIGIBILITY/DIAGNOSIS: TBD  PAST MEDICAL HISTORY:  Past Medical History:  Diagnosis Date   Depression    Hypercholesterolemia    Hypertension    Multiple sclerosis (HCC)    Sees Dr Ala [facility])   Prostate [facility])    Followed by Dr [name] and Dr [name]    SOCIAL HX:  Social History   Tobacco Use   Smoking  status: Never Smoker   Smokeless tobacco: Current User    Types: Chew   Tobacco comment: Is going to try and cut down on the amount.   Substance Use Topics   Alcohol use: No    Alcohol/week: 2.0 standard drinks    Types: 2 Cans of beer per week   FAMILY HX:  Family History  Problem Relation Age of Onset   Hypertension Mother    Stroke Father    Diabetes Maternal Grandmother    Diabetes Maternal Grandfather    Leukemia Sister         died - age 48    ALLERGIES:  Allergies  Allergen Reactions   Gabapentin     Other reaction(s): Dizziness   Pregabalin     Other reaction(s): Dizziness     PERTINENT MEDICATIONS:  Outpatient Encounter Medications as of 71/25/2021  Medication Sig   acetaminophen (TYLENOL) 325 MG tablet Take by mouth every 6 (six) hours as needed. As needed for pain   acidophilus (RISAQUAD) CAPS capsule Take 1 capsule by mouth daily.   Cholecalciferol 250 MCG (10000 UT) TABS Take 1 tablet by mouth daily.   diphenhydrAMINE (BENADRYL) 25 mg capsule Take 25 mg by mouth at bedtime as needed.    DULoxetine (CYMBALTA) 20 MG capsule Take 20 mg by mouth daily.   ezetimibe (ZETIA) 10 MG tablet Take 1 tablet (10 mg total) by mouth daily.   ferrous sulfate 325 (65 FE) MG tablet Take 325 mg by mouth daily with breakfast.   finasteride (PROSCAR) 5 MG tablet Take 1 tablet (5 mg total) by mouth daily.   fluticasone (FLONASE) 50 MCG/ACT nasal spray Place 2 sprays into both nostrils daily.   mirtazapine (REMERON) 15 MG tablet Take 15 mg by mouth at bedtime.   Multiple Vitamin (MULTIVITAMIN) capsule Take 1 capsule by mouth daily.   Polyethyl Glycol-Propyl Glycol (SYSTANE) 0.4-0.3 % SOLN Apply 1 drop to eye in the morning and at bedtime. In both eyes (OU) as needed for dry eyes.   sertraline (ZOLOFT) 25 MG tablet Take 1 tablet (25 mg total) by mouth daily.   tiZANidine (ZANAFLEX) 4 MG tablet TAKE 1 TABLET BY MOUTH NIGHTLY (Patient taking differently: Take 4 mg by mouth at bedtime. )   vitamin B-12 (CYANOCOBALAMIN) 1000 MCG tablet Take 1 tablet (1,000 mcg total) by mouth daily.   No facility-administered encounter medications on file as of 11/18/2019.    PHYSICAL EXAM / ROS:   Current and past weights: stable General: NAD, frail appearing, WNWD Cardiovascular: no chest pain reported, no  LEedema  Pulmonary: no cough, no increased SOB, room air Abdomen: appetite fair, denies constipation, continent  of bowel GU: denies dysuria, continent of urine MSK:  ++ joint and ROM abnormalities, ambulatory with rollator and stand by Skin: no rashes or wounds reported Neurological: Weakness, MS deficits, mild dysarthria  Jason Coop, NP , DNP, MPH, [facility]  COVID-19 PATIENT SCREENING TOOL  Person answering questions: __________staff 1.  Is the patient or any family member in the home showing any signs or symptoms regarding respiratory infection?               Person with Symptom- __________NA_________________  a. Fever  Yes___ No___          ___________________  b. Shortness of breath                                                    Yes___ No___          ___________________ c. Cough/congestion                                       Yes___  No___         ___________________ d. Body aches/pains                                                         Yes___ No___        ____________________ e. Gastrointestinal symptoms (diarrhea, nausea)           Yes___ No___        ____________________  2. Within the past 14 days, has anyone living in the home had any contact with someone with or under investigation for COVID-19?    Yes___ No_X_   Person __________________

## 2019-12-01 DIAGNOSIS — N390 Urinary tract infection, site not specified: Secondary | ICD-10-CM | POA: Diagnosis not present

## 2019-12-01 DIAGNOSIS — I10 Essential (primary) hypertension: Secondary | ICD-10-CM | POA: Diagnosis not present

## 2019-12-04 DIAGNOSIS — F0150 Vascular dementia without behavioral disturbance: Secondary | ICD-10-CM | POA: Diagnosis not present

## 2019-12-04 DIAGNOSIS — G35 Multiple sclerosis: Secondary | ICD-10-CM | POA: Diagnosis not present

## 2019-12-04 DIAGNOSIS — R296 Repeated falls: Secondary | ICD-10-CM | POA: Diagnosis not present

## 2019-12-04 DIAGNOSIS — I10 Essential (primary) hypertension: Secondary | ICD-10-CM | POA: Diagnosis not present

## 2019-12-04 DIAGNOSIS — R531 Weakness: Secondary | ICD-10-CM | POA: Diagnosis not present

## 2019-12-04 DIAGNOSIS — F3289 Other specified depressive episodes: Secondary | ICD-10-CM | POA: Diagnosis not present

## 2019-12-07 DIAGNOSIS — R296 Repeated falls: Secondary | ICD-10-CM | POA: Diagnosis not present

## 2019-12-07 DIAGNOSIS — F3289 Other specified depressive episodes: Secondary | ICD-10-CM | POA: Diagnosis not present

## 2019-12-07 DIAGNOSIS — F0150 Vascular dementia without behavioral disturbance: Secondary | ICD-10-CM | POA: Diagnosis not present

## 2019-12-07 DIAGNOSIS — G35 Multiple sclerosis: Secondary | ICD-10-CM | POA: Diagnosis not present

## 2019-12-07 DIAGNOSIS — I10 Essential (primary) hypertension: Secondary | ICD-10-CM | POA: Diagnosis not present

## 2019-12-07 DIAGNOSIS — R531 Weakness: Secondary | ICD-10-CM | POA: Diagnosis not present

## 2019-12-08 DIAGNOSIS — R296 Repeated falls: Secondary | ICD-10-CM | POA: Diagnosis not present

## 2019-12-08 DIAGNOSIS — R531 Weakness: Secondary | ICD-10-CM | POA: Diagnosis not present

## 2019-12-08 DIAGNOSIS — F3289 Other specified depressive episodes: Secondary | ICD-10-CM | POA: Diagnosis not present

## 2019-12-08 DIAGNOSIS — F0150 Vascular dementia without behavioral disturbance: Secondary | ICD-10-CM | POA: Diagnosis not present

## 2019-12-08 DIAGNOSIS — I10 Essential (primary) hypertension: Secondary | ICD-10-CM | POA: Diagnosis not present

## 2019-12-08 DIAGNOSIS — G35 Multiple sclerosis: Secondary | ICD-10-CM | POA: Diagnosis not present

## 2019-12-09 DIAGNOSIS — R296 Repeated falls: Secondary | ICD-10-CM | POA: Diagnosis not present

## 2019-12-09 DIAGNOSIS — R531 Weakness: Secondary | ICD-10-CM | POA: Diagnosis not present

## 2019-12-09 DIAGNOSIS — G35 Multiple sclerosis: Secondary | ICD-10-CM | POA: Diagnosis not present

## 2019-12-09 DIAGNOSIS — I10 Essential (primary) hypertension: Secondary | ICD-10-CM | POA: Diagnosis not present

## 2019-12-09 DIAGNOSIS — F0150 Vascular dementia without behavioral disturbance: Secondary | ICD-10-CM | POA: Diagnosis not present

## 2019-12-09 DIAGNOSIS — F3289 Other specified depressive episodes: Secondary | ICD-10-CM | POA: Diagnosis not present

## 2019-12-10 DIAGNOSIS — F0150 Vascular dementia without behavioral disturbance: Secondary | ICD-10-CM | POA: Diagnosis not present

## 2019-12-10 DIAGNOSIS — F3289 Other specified depressive episodes: Secondary | ICD-10-CM | POA: Diagnosis not present

## 2019-12-10 DIAGNOSIS — G35 Multiple sclerosis: Secondary | ICD-10-CM | POA: Diagnosis not present

## 2019-12-10 DIAGNOSIS — I10 Essential (primary) hypertension: Secondary | ICD-10-CM | POA: Diagnosis not present

## 2019-12-10 DIAGNOSIS — R296 Repeated falls: Secondary | ICD-10-CM | POA: Diagnosis not present

## 2019-12-10 DIAGNOSIS — R531 Weakness: Secondary | ICD-10-CM | POA: Diagnosis not present

## 2019-12-12 DIAGNOSIS — I10 Essential (primary) hypertension: Secondary | ICD-10-CM | POA: Diagnosis not present

## 2019-12-12 DIAGNOSIS — R531 Weakness: Secondary | ICD-10-CM | POA: Diagnosis not present

## 2019-12-12 DIAGNOSIS — G35 Multiple sclerosis: Secondary | ICD-10-CM | POA: Diagnosis not present

## 2019-12-12 DIAGNOSIS — R296 Repeated falls: Secondary | ICD-10-CM | POA: Diagnosis not present

## 2019-12-12 DIAGNOSIS — F3289 Other specified depressive episodes: Secondary | ICD-10-CM | POA: Diagnosis not present

## 2019-12-12 DIAGNOSIS — F0150 Vascular dementia without behavioral disturbance: Secondary | ICD-10-CM | POA: Diagnosis not present

## 2019-12-14 DIAGNOSIS — I10 Essential (primary) hypertension: Secondary | ICD-10-CM | POA: Diagnosis not present

## 2019-12-14 DIAGNOSIS — R531 Weakness: Secondary | ICD-10-CM | POA: Diagnosis not present

## 2019-12-14 DIAGNOSIS — R296 Repeated falls: Secondary | ICD-10-CM | POA: Diagnosis not present

## 2019-12-14 DIAGNOSIS — F3289 Other specified depressive episodes: Secondary | ICD-10-CM | POA: Diagnosis not present

## 2019-12-14 DIAGNOSIS — G35 Multiple sclerosis: Secondary | ICD-10-CM | POA: Diagnosis not present

## 2019-12-14 DIAGNOSIS — F0150 Vascular dementia without behavioral disturbance: Secondary | ICD-10-CM | POA: Diagnosis not present

## 2019-12-15 DIAGNOSIS — F329 Major depressive disorder, single episode, unspecified: Secondary | ICD-10-CM | POA: Diagnosis not present

## 2019-12-15 DIAGNOSIS — F0150 Vascular dementia without behavioral disturbance: Secondary | ICD-10-CM | POA: Diagnosis not present

## 2019-12-15 DIAGNOSIS — J302 Other seasonal allergic rhinitis: Secondary | ICD-10-CM | POA: Diagnosis not present

## 2019-12-15 DIAGNOSIS — N400 Enlarged prostate without lower urinary tract symptoms: Secondary | ICD-10-CM | POA: Diagnosis not present

## 2019-12-15 DIAGNOSIS — M6281 Muscle weakness (generalized): Secondary | ICD-10-CM | POA: Diagnosis not present

## 2019-12-15 DIAGNOSIS — G35 Multiple sclerosis: Secondary | ICD-10-CM | POA: Diagnosis not present

## 2019-12-15 DIAGNOSIS — M4712 Other spondylosis with myelopathy, cervical region: Secondary | ICD-10-CM | POA: Diagnosis not present

## 2019-12-15 DIAGNOSIS — I10 Essential (primary) hypertension: Secondary | ICD-10-CM | POA: Diagnosis not present

## 2019-12-15 DIAGNOSIS — E785 Hyperlipidemia, unspecified: Secondary | ICD-10-CM | POA: Diagnosis not present

## 2019-12-15 DIAGNOSIS — F3289 Other specified depressive episodes: Secondary | ICD-10-CM | POA: Diagnosis not present

## 2019-12-15 DIAGNOSIS — R296 Repeated falls: Secondary | ICD-10-CM | POA: Diagnosis not present

## 2019-12-15 DIAGNOSIS — R531 Weakness: Secondary | ICD-10-CM | POA: Diagnosis not present

## 2019-12-15 DIAGNOSIS — E559 Vitamin D deficiency, unspecified: Secondary | ICD-10-CM | POA: Diagnosis not present

## 2019-12-16 DIAGNOSIS — I10 Essential (primary) hypertension: Secondary | ICD-10-CM | POA: Diagnosis not present

## 2019-12-16 DIAGNOSIS — F3289 Other specified depressive episodes: Secondary | ICD-10-CM | POA: Diagnosis not present

## 2019-12-16 DIAGNOSIS — R531 Weakness: Secondary | ICD-10-CM | POA: Diagnosis not present

## 2019-12-16 DIAGNOSIS — G35 Multiple sclerosis: Secondary | ICD-10-CM | POA: Diagnosis not present

## 2019-12-16 DIAGNOSIS — R296 Repeated falls: Secondary | ICD-10-CM | POA: Diagnosis not present

## 2019-12-16 DIAGNOSIS — F0150 Vascular dementia without behavioral disturbance: Secondary | ICD-10-CM | POA: Diagnosis not present

## 2019-12-17 DIAGNOSIS — F0150 Vascular dementia without behavioral disturbance: Secondary | ICD-10-CM | POA: Diagnosis not present

## 2019-12-17 DIAGNOSIS — R296 Repeated falls: Secondary | ICD-10-CM | POA: Diagnosis not present

## 2019-12-17 DIAGNOSIS — F3289 Other specified depressive episodes: Secondary | ICD-10-CM | POA: Diagnosis not present

## 2019-12-17 DIAGNOSIS — I10 Essential (primary) hypertension: Secondary | ICD-10-CM | POA: Diagnosis not present

## 2019-12-17 DIAGNOSIS — G35 Multiple sclerosis: Secondary | ICD-10-CM | POA: Diagnosis not present

## 2019-12-17 DIAGNOSIS — R531 Weakness: Secondary | ICD-10-CM | POA: Diagnosis not present

## 2019-12-29 DIAGNOSIS — E785 Hyperlipidemia, unspecified: Secondary | ICD-10-CM | POA: Diagnosis not present

## 2019-12-29 DIAGNOSIS — Z23 Encounter for immunization: Secondary | ICD-10-CM | POA: Diagnosis not present

## 2019-12-29 DIAGNOSIS — G35 Multiple sclerosis: Secondary | ICD-10-CM | POA: Diagnosis not present

## 2019-12-29 DIAGNOSIS — F0150 Vascular dementia without behavioral disturbance: Secondary | ICD-10-CM | POA: Diagnosis not present

## 2019-12-29 DIAGNOSIS — F3289 Other specified depressive episodes: Secondary | ICD-10-CM | POA: Diagnosis not present

## 2019-12-29 DIAGNOSIS — M6281 Muscle weakness (generalized): Secondary | ICD-10-CM | POA: Diagnosis not present

## 2019-12-29 DIAGNOSIS — I10 Essential (primary) hypertension: Secondary | ICD-10-CM | POA: Diagnosis not present

## 2020-01-01 DIAGNOSIS — G35 Multiple sclerosis: Secondary | ICD-10-CM | POA: Diagnosis not present

## 2020-01-01 DIAGNOSIS — F3289 Other specified depressive episodes: Secondary | ICD-10-CM | POA: Diagnosis not present

## 2020-01-01 DIAGNOSIS — R2681 Unsteadiness on feet: Secondary | ICD-10-CM | POA: Diagnosis not present

## 2020-01-01 DIAGNOSIS — F0150 Vascular dementia without behavioral disturbance: Secondary | ICD-10-CM | POA: Diagnosis not present

## 2020-01-01 DIAGNOSIS — I10 Essential (primary) hypertension: Secondary | ICD-10-CM | POA: Diagnosis not present

## 2020-01-04 DIAGNOSIS — I10 Essential (primary) hypertension: Secondary | ICD-10-CM | POA: Diagnosis not present

## 2020-01-04 DIAGNOSIS — R2681 Unsteadiness on feet: Secondary | ICD-10-CM | POA: Diagnosis not present

## 2020-01-04 DIAGNOSIS — F3289 Other specified depressive episodes: Secondary | ICD-10-CM | POA: Diagnosis not present

## 2020-01-04 DIAGNOSIS — F0150 Vascular dementia without behavioral disturbance: Secondary | ICD-10-CM | POA: Diagnosis not present

## 2020-01-04 DIAGNOSIS — G35 Multiple sclerosis: Secondary | ICD-10-CM | POA: Diagnosis not present

## 2020-01-05 DIAGNOSIS — R2681 Unsteadiness on feet: Secondary | ICD-10-CM | POA: Diagnosis not present

## 2020-01-05 DIAGNOSIS — G35 Multiple sclerosis: Secondary | ICD-10-CM | POA: Diagnosis not present

## 2020-01-05 DIAGNOSIS — F0150 Vascular dementia without behavioral disturbance: Secondary | ICD-10-CM | POA: Diagnosis not present

## 2020-01-05 DIAGNOSIS — I10 Essential (primary) hypertension: Secondary | ICD-10-CM | POA: Diagnosis not present

## 2020-01-05 DIAGNOSIS — F3289 Other specified depressive episodes: Secondary | ICD-10-CM | POA: Diagnosis not present

## 2020-01-06 DIAGNOSIS — G35 Multiple sclerosis: Secondary | ICD-10-CM | POA: Diagnosis not present

## 2020-01-06 DIAGNOSIS — F3289 Other specified depressive episodes: Secondary | ICD-10-CM | POA: Diagnosis not present

## 2020-01-06 DIAGNOSIS — I10 Essential (primary) hypertension: Secondary | ICD-10-CM | POA: Diagnosis not present

## 2020-01-06 DIAGNOSIS — R2681 Unsteadiness on feet: Secondary | ICD-10-CM | POA: Diagnosis not present

## 2020-01-06 DIAGNOSIS — F0150 Vascular dementia without behavioral disturbance: Secondary | ICD-10-CM | POA: Diagnosis not present

## 2020-01-07 DIAGNOSIS — R2681 Unsteadiness on feet: Secondary | ICD-10-CM | POA: Diagnosis not present

## 2020-01-07 DIAGNOSIS — F0150 Vascular dementia without behavioral disturbance: Secondary | ICD-10-CM | POA: Diagnosis not present

## 2020-01-07 DIAGNOSIS — I10 Essential (primary) hypertension: Secondary | ICD-10-CM | POA: Diagnosis not present

## 2020-01-07 DIAGNOSIS — G35 Multiple sclerosis: Secondary | ICD-10-CM | POA: Diagnosis not present

## 2020-01-07 DIAGNOSIS — F3289 Other specified depressive episodes: Secondary | ICD-10-CM | POA: Diagnosis not present

## 2020-01-08 DIAGNOSIS — I10 Essential (primary) hypertension: Secondary | ICD-10-CM | POA: Diagnosis not present

## 2020-01-08 DIAGNOSIS — F0150 Vascular dementia without behavioral disturbance: Secondary | ICD-10-CM | POA: Diagnosis not present

## 2020-01-08 DIAGNOSIS — F3289 Other specified depressive episodes: Secondary | ICD-10-CM | POA: Diagnosis not present

## 2020-01-08 DIAGNOSIS — G35 Multiple sclerosis: Secondary | ICD-10-CM | POA: Diagnosis not present

## 2020-01-08 DIAGNOSIS — R2681 Unsteadiness on feet: Secondary | ICD-10-CM | POA: Diagnosis not present

## 2020-01-11 DIAGNOSIS — F0150 Vascular dementia without behavioral disturbance: Secondary | ICD-10-CM | POA: Diagnosis not present

## 2020-01-11 DIAGNOSIS — R2681 Unsteadiness on feet: Secondary | ICD-10-CM | POA: Diagnosis not present

## 2020-01-11 DIAGNOSIS — I10 Essential (primary) hypertension: Secondary | ICD-10-CM | POA: Diagnosis not present

## 2020-01-11 DIAGNOSIS — F3289 Other specified depressive episodes: Secondary | ICD-10-CM | POA: Diagnosis not present

## 2020-01-11 DIAGNOSIS — G35 Multiple sclerosis: Secondary | ICD-10-CM | POA: Diagnosis not present

## 2020-01-12 DIAGNOSIS — F0150 Vascular dementia without behavioral disturbance: Secondary | ICD-10-CM | POA: Diagnosis not present

## 2020-01-12 DIAGNOSIS — F3289 Other specified depressive episodes: Secondary | ICD-10-CM | POA: Diagnosis not present

## 2020-01-12 DIAGNOSIS — R2681 Unsteadiness on feet: Secondary | ICD-10-CM | POA: Diagnosis not present

## 2020-01-12 DIAGNOSIS — I10 Essential (primary) hypertension: Secondary | ICD-10-CM | POA: Diagnosis not present

## 2020-01-12 DIAGNOSIS — G35 Multiple sclerosis: Secondary | ICD-10-CM | POA: Diagnosis not present

## 2020-01-13 DIAGNOSIS — G35 Multiple sclerosis: Secondary | ICD-10-CM | POA: Diagnosis not present

## 2020-01-13 DIAGNOSIS — R2681 Unsteadiness on feet: Secondary | ICD-10-CM | POA: Diagnosis not present

## 2020-01-13 DIAGNOSIS — F3289 Other specified depressive episodes: Secondary | ICD-10-CM | POA: Diagnosis not present

## 2020-01-13 DIAGNOSIS — F0150 Vascular dementia without behavioral disturbance: Secondary | ICD-10-CM | POA: Diagnosis not present

## 2020-01-13 DIAGNOSIS — I10 Essential (primary) hypertension: Secondary | ICD-10-CM | POA: Diagnosis not present

## 2020-01-15 DIAGNOSIS — G35 Multiple sclerosis: Secondary | ICD-10-CM | POA: Diagnosis not present

## 2020-01-15 DIAGNOSIS — F3289 Other specified depressive episodes: Secondary | ICD-10-CM | POA: Diagnosis not present

## 2020-01-15 DIAGNOSIS — R2681 Unsteadiness on feet: Secondary | ICD-10-CM | POA: Diagnosis not present

## 2020-01-15 DIAGNOSIS — F0150 Vascular dementia without behavioral disturbance: Secondary | ICD-10-CM | POA: Diagnosis not present

## 2020-01-15 DIAGNOSIS — I10 Essential (primary) hypertension: Secondary | ICD-10-CM | POA: Diagnosis not present

## 2020-01-18 DIAGNOSIS — F0150 Vascular dementia without behavioral disturbance: Secondary | ICD-10-CM | POA: Diagnosis not present

## 2020-01-18 DIAGNOSIS — R2681 Unsteadiness on feet: Secondary | ICD-10-CM | POA: Diagnosis not present

## 2020-01-18 DIAGNOSIS — G35 Multiple sclerosis: Secondary | ICD-10-CM | POA: Diagnosis not present

## 2020-01-18 DIAGNOSIS — I10 Essential (primary) hypertension: Secondary | ICD-10-CM | POA: Diagnosis not present

## 2020-01-18 DIAGNOSIS — F3289 Other specified depressive episodes: Secondary | ICD-10-CM | POA: Diagnosis not present

## 2020-01-20 DIAGNOSIS — F0150 Vascular dementia without behavioral disturbance: Secondary | ICD-10-CM | POA: Diagnosis not present

## 2020-01-20 DIAGNOSIS — I10 Essential (primary) hypertension: Secondary | ICD-10-CM | POA: Diagnosis not present

## 2020-01-20 DIAGNOSIS — F3289 Other specified depressive episodes: Secondary | ICD-10-CM | POA: Diagnosis not present

## 2020-01-20 DIAGNOSIS — R2681 Unsteadiness on feet: Secondary | ICD-10-CM | POA: Diagnosis not present

## 2020-01-20 DIAGNOSIS — G35 Multiple sclerosis: Secondary | ICD-10-CM | POA: Diagnosis not present

## 2020-01-21 DIAGNOSIS — R2681 Unsteadiness on feet: Secondary | ICD-10-CM | POA: Diagnosis not present

## 2020-01-21 DIAGNOSIS — I10 Essential (primary) hypertension: Secondary | ICD-10-CM | POA: Diagnosis not present

## 2020-01-21 DIAGNOSIS — F3289 Other specified depressive episodes: Secondary | ICD-10-CM | POA: Diagnosis not present

## 2020-01-21 DIAGNOSIS — F0150 Vascular dementia without behavioral disturbance: Secondary | ICD-10-CM | POA: Diagnosis not present

## 2020-01-21 DIAGNOSIS — G35 Multiple sclerosis: Secondary | ICD-10-CM | POA: Diagnosis not present

## 2020-01-22 DIAGNOSIS — F0150 Vascular dementia without behavioral disturbance: Secondary | ICD-10-CM | POA: Diagnosis not present

## 2020-01-22 DIAGNOSIS — I10 Essential (primary) hypertension: Secondary | ICD-10-CM | POA: Diagnosis not present

## 2020-01-22 DIAGNOSIS — G35 Multiple sclerosis: Secondary | ICD-10-CM | POA: Diagnosis not present

## 2020-01-22 DIAGNOSIS — R2681 Unsteadiness on feet: Secondary | ICD-10-CM | POA: Diagnosis not present

## 2020-01-22 DIAGNOSIS — F3289 Other specified depressive episodes: Secondary | ICD-10-CM | POA: Diagnosis not present

## 2020-01-26 DIAGNOSIS — F0150 Vascular dementia without behavioral disturbance: Secondary | ICD-10-CM | POA: Diagnosis not present

## 2020-01-26 DIAGNOSIS — M21371 Foot drop, right foot: Secondary | ICD-10-CM | POA: Diagnosis not present

## 2020-01-26 DIAGNOSIS — R2681 Unsteadiness on feet: Secondary | ICD-10-CM | POA: Diagnosis not present

## 2020-01-26 DIAGNOSIS — I10 Essential (primary) hypertension: Secondary | ICD-10-CM | POA: Diagnosis not present

## 2020-01-26 DIAGNOSIS — R296 Repeated falls: Secondary | ICD-10-CM | POA: Diagnosis not present

## 2020-01-26 DIAGNOSIS — G35 Multiple sclerosis: Secondary | ICD-10-CM | POA: Diagnosis not present

## 2020-01-27 DIAGNOSIS — R2681 Unsteadiness on feet: Secondary | ICD-10-CM | POA: Diagnosis not present

## 2020-01-27 DIAGNOSIS — F0150 Vascular dementia without behavioral disturbance: Secondary | ICD-10-CM | POA: Diagnosis not present

## 2020-01-27 DIAGNOSIS — M21371 Foot drop, right foot: Secondary | ICD-10-CM | POA: Diagnosis not present

## 2020-01-27 DIAGNOSIS — I10 Essential (primary) hypertension: Secondary | ICD-10-CM | POA: Diagnosis not present

## 2020-01-27 DIAGNOSIS — R296 Repeated falls: Secondary | ICD-10-CM | POA: Diagnosis not present

## 2020-01-27 DIAGNOSIS — G35 Multiple sclerosis: Secondary | ICD-10-CM | POA: Diagnosis not present

## 2020-01-28 DIAGNOSIS — F0150 Vascular dementia without behavioral disturbance: Secondary | ICD-10-CM | POA: Diagnosis not present

## 2020-01-28 DIAGNOSIS — G35 Multiple sclerosis: Secondary | ICD-10-CM | POA: Diagnosis not present

## 2020-01-28 DIAGNOSIS — I10 Essential (primary) hypertension: Secondary | ICD-10-CM | POA: Diagnosis not present

## 2020-01-28 DIAGNOSIS — M21371 Foot drop, right foot: Secondary | ICD-10-CM | POA: Diagnosis not present

## 2020-01-28 DIAGNOSIS — R2681 Unsteadiness on feet: Secondary | ICD-10-CM | POA: Diagnosis not present

## 2020-01-28 DIAGNOSIS — R296 Repeated falls: Secondary | ICD-10-CM | POA: Diagnosis not present

## 2020-02-02 DIAGNOSIS — E559 Vitamin D deficiency, unspecified: Secondary | ICD-10-CM | POA: Diagnosis not present

## 2020-02-02 DIAGNOSIS — E538 Deficiency of other specified B group vitamins: Secondary | ICD-10-CM | POA: Diagnosis not present

## 2020-02-02 DIAGNOSIS — J309 Allergic rhinitis, unspecified: Secondary | ICD-10-CM | POA: Diagnosis not present

## 2020-02-02 DIAGNOSIS — I10 Essential (primary) hypertension: Secondary | ICD-10-CM | POA: Diagnosis not present

## 2020-02-02 DIAGNOSIS — D519 Vitamin B12 deficiency anemia, unspecified: Secondary | ICD-10-CM | POA: Diagnosis not present

## 2020-02-02 DIAGNOSIS — D508 Other iron deficiency anemias: Secondary | ICD-10-CM | POA: Diagnosis not present

## 2020-03-02 DIAGNOSIS — M79674 Pain in right toe(s): Secondary | ICD-10-CM | POA: Diagnosis not present

## 2020-03-02 DIAGNOSIS — M79675 Pain in left toe(s): Secondary | ICD-10-CM | POA: Diagnosis not present

## 2020-03-02 DIAGNOSIS — B351 Tinea unguium: Secondary | ICD-10-CM | POA: Diagnosis not present

## 2020-03-09 ENCOUNTER — Non-Acute Institutional Stay: Payer: Medicare Other | Admitting: Primary Care

## 2020-03-09 ENCOUNTER — Other Ambulatory Visit: Payer: Self-pay

## 2020-03-16 ENCOUNTER — Other Ambulatory Visit: Payer: Self-pay

## 2020-03-16 ENCOUNTER — Non-Acute Institutional Stay: Payer: Medicare Other | Admitting: Primary Care

## 2020-03-16 DIAGNOSIS — Z515 Encounter for palliative care: Secondary | ICD-10-CM | POA: Diagnosis not present

## 2020-03-16 DIAGNOSIS — G35 Multiple sclerosis: Secondary | ICD-10-CM

## 2020-03-16 NOTE — Progress Notes (Signed)
[facility] Consult Note Telephone: [phone]  Fax: [phone]     Date of encounter: 03/16/20 PATIENT NAME: Philip Gordon [id number] [phone] (home)  DOB: 01-02-1951 MRN: [id number]  PRIMARY CARE PROVIDER:    Jennette Bill., MD,  [place] [address] [phone]  REFERRING PROVIDER:   Jennette Bill., MD,  [place] [address] [phone] RESPONSIBLE PARTY:   Extended Emergency Contact Information Primary Emergency Contact: Dany Gordon,Philip Address: [address]          [place], [place] [address] [place] Phone: [phone] Relation: Other Secondary Emergency Contact: Kayln Gordon,Philip  [place] Phone: [phone] Relation: Friend  I met face to face with patient  In the facility. Palliative Care was asked to follow this patient by consultation request of Hester, Ronnette Hila., MD  to help address advance care planning and goals of care. This is a follow up  visit.   ASSESSMENT AND RECOMMENDATIONS:   1. Advance Care Planning/Goals of Care: Goals include to maximize quality of life and symptom management.MOST on file with DNR, comfort measures, limited use of abx and iv, no feeding tube. No changes .  2. Symptom Management:   Nutrition; Pt endorses poor appetite and dysgeusia. Recommend supplements for increased protein intake.   Med management: we reviewed pt medications and supplements. No changes.  Mobility: Able to ambulate to bath room with stand by. Patient is a fall risk.  3. Follow up Palliative Care Visit: Palliative care will continue to follow for goals of care clarification and symptom management. Return 2-3  months or prn.  4. Family /Caregiver/Community Supports: Cousins are [employer], lives in [place].  5. Cognitive / Functional decline: A and O x 3, able to do some adls, needs help with  iadls.  I spent 25 minutes providing this consultation,  From 1130 to 1155. More than 50% of the time in this consultation was spent coordinating communication.   CODE STATUS:DNR  PPS: 40%  HOSPICE ELIGIBILITY/DIAGNOSIS: TBD  Subjective:  CHIEF COMPLAINT: poor appetite  HISTORY OF PRESENT ILLNESS:  Philip Gordon is a 69 y.o. year old male  with MS, debility, immobility, nutritional deficits.   We are asked to consult around advance care planning and complex medical decision making.    History obtained from review of EMR, discussion with primary team, and  interview with family, caregiver  and/or Philip Gordon. Records reviewed and summarized above.   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   Bilateral foot-drop 02/04/2015   Sinus infection 11/15/2014   Right mandibular swelling 11/15/2014   Brain lesion 01/17/2014   Obstruction of urinary tract 10/15/2012   Bladder outlet obstruction 10/15/2012   Disorder of bladder function 01/16/2012   Incomplete bladder emptying 01/16/2012   History of prostate cancer 01/16/2012   DS (disseminated sclerosis) ([place]) 01/16/2012   Hypertension 12/28/2011   Hypercholesteremia 12/28/2011   Multiple sclerosis ([place]) 12/28/2011   B12 deficiency 12/28/2011   Acquired ankle/foot deformity 12/07/2010   PAST MEDICAL HISTORY:  Active Ambulatory Problems    Diagnosis Date Noted   Hypertension 12/28/2011   Hypercholesteremia 12/28/2011   Multiple  sclerosis ([place]) 12/28/2011   B12 deficiency 12/28/2011   Brain lesion 01/17/2014   Acquired ankle/foot deformity 12/07/2010   Disorder of bladder function 01/16/2012   Incomplete bladder emptying 01/16/2012   History  of prostate cancer 01/16/2012   DS (disseminated sclerosis) ([place]) 01/16/2012   Obstruction of urinary tract 10/15/2012   Sinus infection 11/15/2014   Right mandibular swelling 11/15/2014   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[place]) 09/21/2017   Bilateral foot-drop 02/04/2015   Bladder outlet obstruction 10/15/2012   Weakness 01/21/2019   AKI (acute kidney injury) ([place]) 01/21/2019   Fall 01/23/2019   Autoimmune hemolytic anemia ([place]) 04/18/2019   Resolved Ambulatory Problems    Diagnosis Date Noted   Malignant neoplasm of prostate ([place]) 01/16/2012   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   Substance Use Topics   Alcohol use: No    Alcohol/week: 2.0 standard drinks    Types: 2 Cans of beer per week   FAMILY HX:  Family History  Problem Relation Age of Onset   Hypertension Mother    Stroke Father    Diabetes Maternal Grandmother    Diabetes Maternal Grandfather    Leukemia Sister        died - age 18      ALLERGIES:  Allergies  Allergen Reactions   Gabapentin     Other reaction(s): Dizziness   Pregabalin     Other reaction(s): Dizziness     PERTINENT MEDICATIONS:  Outpatient Encounter Medications as of 03/16/2020  Medication Sig   acetaminophen (TYLENOL) 325 MG tablet Take by mouth every 6 (six) hours as needed. As needed for pain   acidophilus (RISAQUAD) CAPS capsule Take 1 capsule by mouth daily.   Cholecalciferol 250 MCG (10000 UT) TABS Take 1 tablet by mouth daily.   diphenhydrAMINE (BENADRYL) 25 mg capsule Take 25 mg by mouth at bedtime as needed.    DULoxetine (CYMBALTA) 20 MG capsule Take 20 mg by mouth daily.    ezetimibe (ZETIA) 10 MG tablet Take 1 tablet (10 mg total) by mouth daily.   ferrous sulfate 325 (65 FE) MG tablet Take 325 mg by mouth daily with breakfast.   finasteride (PROSCAR) 5 MG tablet Take 1 tablet (5 mg total) by mouth daily.   fluticasone (FLONASE) 50 MCG/ACT nasal spray Place 2 sprays into both nostrils daily.   mirtazapine (REMERON) 15 MG tablet Take 15 mg by mouth at bedtime.   Multiple Vitamin (MULTIVITAMIN) capsule Take 1 capsule by mouth daily.   Polyethyl Glycol-Propyl Glycol (SYSTANE) 0.4-0.3 % SOLN Apply 1 drop to eye in the morning and at bedtime. In both eyes (OU) as needed for dry eyes.   sertraline (ZOLOFT) 25 MG tablet Take 1 tablet (25 mg total) by mouth daily.   tiZANidine (ZANAFLEX) 4 MG tablet TAKE 1 TABLET BY MOUTH NIGHTLY (Patient taking differently: Take 4 mg by mouth at bedtime. )   vitamin B-12 (CYANOCOBALAMIN) 1000 MCG tablet Take 1 tablet (1,000 mcg total) by mouth daily.   No facility-administered encounter medications on file as of 03/16/2020.    Objective: ROS  General: NAD ENMT: denies dysphagia  Cardiovascular: denies chest pain Pulmonary: denies  cough, denies increased SOB Abdomen: endorses fair appetite, denies  constipation, endorses continence of bowel GU: denies dysuria, endorses continence of urine MSK:  Endorses+we ROM limitations, no falls reported Skin: denies rashes or wounds Neurological: endorses weakness, denies pain, denies insomnia Psych: Endorses positive mood Heme/lymph/immuno: denies bruises, abnormal bleeding  Physical Exam: Current and past weights: stable Constitutional: NAD General: frail appearing, thin EYES: anicteric sclera,lids intact, no discharge  ENMT: intact hearing,oral mucous membranes moist, dentition intact CV: no LE edema Pulmonary:  no increased work of breathing, no cough, no audible wheezes, room air Abdomen: intake 50%, no ascities MSK: moderate sarcopenia, decreased ROM in all extremities,  no contractures of LE,ambulatory with assistance Skin: warm and dry, no rashes or wounds on visible skin Neuro: Generalized weakness,mild to mod cognitive impairment, Secondary progressive multiple sclerosis Psych: non-anxious affect, A and O x 3 Hem/lymph/immuno: no widespread bruising   Thank you for the opportunity to participate in the care of Philip Gordon.  The palliative care team will continue to follow. Please call our office at [phone] if we can be of additional assistance.  Jason Coop, NP , DNP, MPH, AGPCNP-BC, A[facility]  COVID-19 PATIENT SCREENING TOOL  Person answering questions: ____________staff______ _____   1.  Is the patient or any family member in the home showing any signs or symptoms regarding respiratory infection?               Person with Symptom- __________NA_________________  a. Fever                                                                          Yes___ No___          ___________________  b. Shortness of breath                                                    Yes___ No___          ___________________ c. Cough/congestion                                       Yes___  No___         ___________________ d. Body aches/pains                                                         Yes___ No___        ____________________ e. Gastrointestinal symptoms (diarrhea, nausea)           Yes___ No___        ____________________  2. Within the past 14 days, has anyone living in the home had any contact with someone with or under investigation for COVID-19?    Yes___ No_X_   Person __________________

## 2020-05-17 ENCOUNTER — Ambulatory Visit: Payer: Medicare Other | Admitting: Cardiology

## 2020-05-18 ENCOUNTER — Non-Acute Institutional Stay: Payer: Medicare Other | Admitting: Primary Care

## 2020-05-18 ENCOUNTER — Other Ambulatory Visit: Payer: Self-pay

## 2020-06-24 ENCOUNTER — Non-Acute Institutional Stay: Payer: Medicare Other | Admitting: Primary Care

## 2020-06-24 ENCOUNTER — Other Ambulatory Visit: Payer: Self-pay

## 2020-06-24 DIAGNOSIS — D591 Autoimmune hemolytic anemia, unspecified: Secondary | ICD-10-CM

## 2020-06-24 DIAGNOSIS — M21372 Foot drop, left foot: Secondary | ICD-10-CM

## 2020-06-24 DIAGNOSIS — G35 Multiple sclerosis: Secondary | ICD-10-CM

## 2020-06-24 DIAGNOSIS — R531 Weakness: Secondary | ICD-10-CM

## 2020-06-24 DIAGNOSIS — Z515 Encounter for palliative care: Secondary | ICD-10-CM

## 2020-06-24 DIAGNOSIS — M21371 Foot drop, right foot: Secondary | ICD-10-CM

## 2020-06-24 NOTE — Progress Notes (Signed)
[facility] Consult Note Telephone: [phone]  Fax: [phone]    Date of encounter: 06/24/20 PATIENT NAME: Philip Gordon [id number]   [phone] (home)  DOB: 09-25-1950 MRN: [id number] PRIMARY CARE PROVIDER:    Jennette Bill., MD,  [address] [place] [address] [phone]  REFERRING PROVIDER:   Jennette Bill., MD [address] [place],  [place] [address] [phone]  RESPONSIBLE PARTY:    Contact Information    Name Relation Home Work [place] Other [phone]     Genia Del [place] Daughter [phone]        I met face to face with patient and family in facility. Palliative Care was asked to follow this patient by consultation request of  Hester, Ronnette Hila., MD to address advance care planning and complex medical decision making. This is the follow up visit.   ASSESSMENT AND RECOMMENDATIONS:   1. Advance Care Planning/Goals of Care: Goals include to maximize quality of life and symptom management.   Exploration of personal, cultural or spiritual beliefs that might influence medical decisions   Identification  of a healthcare agent  cousin  Review  of an  advance directive document .  MOST on file with DNR  2. Symptom Management:    I met with Mr Warrick Parisian today in his room period also present were his cousins.   PAIN :he continues to complain about pain related to  MS and spasms. He has a hi  patch on his right scapula. He endorses spasms in this area. I see his zanaflex was discontinued. I would recommend to add the zanaflex in again to his regimen at a lower dose if he was somnolent. Apparently there was some question about some of his medicines and fall risks however as his disease progresses immobility and spasticity will become more and more of an issue. Recommend 2 mg Zanaflex  Q 8 hours PRN spasms/  spasticity/ pain  Rhinitis: Appears to have seasonal rhinitis. He and cousin complain he is not getting a good effect from the current 10 gm loratadine he is on. Recommend Flonase bid. Recommend changing loratadine to cetirizine if reasonable trial has been done.  Fall Risk: Has had some falls. Endorses being able to walk with rollator and leg braces. He needs stand by for ambulation safety. He may benefit from restorative exercise. He should have rollator for all ambulation.  Mood: Currently on Zoloft, was taken off duloxetine, not sure time frame. He does seem flatter of affect today and family endorses his loss of pleasure in former activities.  Nutrition; patient does not appear subjectively to have lost weight. He  endorses a poor appetite and lack of interest in food unless it is from takeout. He chews chewing tobacco most of the time which may affect Sense of taste and food enjoyment. 3. Follow up Palliative Care Visit: Palliative care will continue to follow for goals of care clarification and symptom management. Return 8 weeks or prn.  4. Family /Caregiver/Community Supports: cousin is POA, lives in [place]  5. Cognitive / Functional decline:  A and O x 3, dependent  inmost ADLs, all IADLS.  I spent 35 minutes providing this consultation. More than 50% of the time in this consultation was spent in counseling and care coordination.   CODE STATUS: DNR  PPS: 40%  HOSPICE ELIGIBILITY/DIAGNOSIS: TBD  CHIEF COMPLAINT: spastic pain  HISTORY OF PRESENT ILLNESS:  Philip Gordon is a 70 y.o. year old male  with MS,  Immobility, Autoimmune hemolytic anemia, foot drop. He endorses walking with walker but also endorses falls. He has some pain in R scapula . Zanaflex was discontinued but he preferred that for pain relief. He endorses some recent falls and he and family attribute it to medications. However, his benadryl has been d/c'ed and mirtazapine was reduced from 15 to 7.5 mg. He also is on  zoloft for depression. Recommend return to smaller zanaflex dosing and  Observe for safety. Has a patch currently on R scapula that does not work.  History obtained from review of EMR, discussion with primary team, and  interview with family, caregiver  and/or Mr. Leaf. Records reviewed and summarized above.   CURRENT PROBLEM LIST:  Patient Active Problem List   Diagnosis Date Noted  . Autoimmune hemolytic anemia ([place]) 04/18/2019  . Fall 01/23/2019  . Weakness 01/21/2019  . AKI (acute kidney injury) ([place]) 01/21/2019  . 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 Bilateral foot-drop 02/04/2015  . Sinus infection 11/15/2014  . Right mandibular swelling 11/15/2014  . Brain lesion 01/17/2014  . Obstruction of urinary tract 10/15/2012  . Bladder outlet obstruction 10/15/2012  . Disorder of bladder function 01/16/2012  . Incomplete bladder emptying 01/16/2012  . History of prostate cancer 01/16/2012  . DS (disseminated sclerosis) ([place]) 01/16/2012  . Hypertension 12/28/2011  . Hypercholesteremia 12/28/2011  . Multiple sclerosis ([place]) 12/28/2011  . B12 deficiency 12/28/2011  . Acquired ankle/foot deformity 12/07/2010   PAST MEDICAL HISTORY:  Active Ambulatory Problems    Diagnosis Date Noted  . Hypertension 12/28/2011  . Hypercholesteremia 12/28/2011  . Multiple sclerosis ([place]) 12/28/2011  . B12 deficiency 12/28/2011  . Brain lesion 01/17/2014  . Acquired ankle/foot deformity 12/07/2010  . Disorder of bladder function 01/16/2012  . Incomplete bladder emptying 01/16/2012  . History of prostate cancer 01/16/2012  . DS (disseminated sclerosis) ([place]) 01/16/2012  . Obstruction of urinary tract 10/15/2012  . Sinus infection 11/15/2014  . Right mandibular swelling 11/15/2014  . 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 Bilateral foot-drop 02/04/2015  . Bladder outlet obstruction 10/15/2012  .  Weakness 01/21/2019  . AKI (acute kidney injury) ([place]) 01/21/2019  . Fall 01/23/2019  . Autoimmune hemolytic anemia ([place]) 04/18/2019   Resolved Ambulatory Problems    Diagnosis Date Noted  . Malignant neoplasm of prostate ([place]) 01/16/2012  . 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 Depression   . Hypercholesterolemia   . Prostate cancer [facility])    SOCIAL HX:  Social History   Tobacco Use  . Smoking status: Never Smoker  . Smokeless tobacco: Current User    Types: Chew  . Tobacco comment: Is going to try and cut down on the amount.   Substance Use Topics  . Alcohol use: No    Alcohol/week: 2.0 standard drinks    Types: 2 Cans of beer per week   FAMILY HX:  Family History  Problem Relation Age of Onset  . Hypertension Mother   . Stroke Father   . Diabetes Maternal Grandmother   . Diabetes Maternal Grandfather   . Leukemia Sister        died - age 26      ALLERGIES:  Allergies  Allergen Reactions  . Gabapentin     Other reaction(s): Dizziness  .  Pregabalin     Other reaction(s): Dizziness     PERTINENT MEDICATIONS:  Outpatient Encounter Medications as of 06/24/2020  Medication Sig  . acetaminophen (TYLENOL) 325 MG tablet Take by mouth every 6 (six) hours as needed. As needed for pain  . acidophilus (RISAQUAD) CAPS capsule Take 1 capsule by mouth daily.  . Cholecalciferol 250 MCG (10000 UT) TABS Take 1 tablet by mouth daily.  Marland Kitchen ezetimibe (ZETIA) 10 MG tablet Take 1 tablet (10 mg total) by mouth daily.  . ferrous sulfate 325 (65 FE) MG tablet Take 325 mg by mouth daily with breakfast.  . finasteride (PROSCAR) 5 MG tablet Take 1 tablet (5 mg total) by mouth daily.  . fluticasone (FLONASE) 50 MCG/ACT nasal spray Place 2 sprays into both nostrils daily.  Marland Kitchen lidocaine (LIDODERM) 5 % Place 1 patch onto the skin daily. Remove & Discard patch within 12 hours or as directed by MD  . loratadine (CLARITIN) 10 MG tablet Take 10 mg by mouth daily.   . melatonin 3 MG TABS tablet Take 3 mg by mouth at bedtime.  . Multiple Vitamin (MULTIVITAMIN) capsule Take 1 capsule by mouth daily.  Vladimir Faster Glycol-Propyl Glycol (SYSTANE) 0.4-0.3 % SOLN Apply 1 drop to eye in the morning and at bedtime. In both eyes (OU) as needed for dry eyes.  Marland Kitchen sertraline (ZOLOFT) 25 MG tablet Take 1 tablet (25 mg total) by mouth daily.  . vitamin B-12 (CYANOCOBALAMIN) 1000 MCG tablet Take 1 tablet (1,000 mcg total) by mouth daily.  . [DISCONTINUED] mirtazapine (REMERON) 15 MG tablet Take 7.5 mg by mouth at bedtime.  . mirtazapine (REMERON) 7.5 MG tablet Take 7.5 mg by mouth at bedtime.  Marland Kitchen Propylene Glycol 0.6 % SOLN Apply to eye.  Marland Kitchen tiZANidine (ZANAFLEX) 4 MG tablet TAKE 1 TABLET BY MOUTH NIGHTLY (Patient taking differently: Take 4 mg by mouth at bedtime. )  . [DISCONTINUED] diphenhydrAMINE (BENADRYL) 25 mg capsule Take 25 mg by mouth at bedtime as needed.   . [DISCONTINUED] DULoxetine (CYMBALTA) 20 MG capsule Take 20 mg by mouth daily.   No facility-administered encounter medications on file as of 06/24/2020.     ROS  General: NAD EYES: denies vision changes ENMT: denies dysphagia Cardiovascular: denies chest pain, denies DOE Pulmonary: denies cough, denies increased SOB Abdomen: endorses POOR  Appetite,endorses dysgeusia,  denies constipation, endorses continence of bowel GU: denies dysuria, endorses continence of urine MSK:  Endorses spasms and weakness,  One  Fall reported Skin: denies rashes or wounds Neurological: denies pain, denies insomnia Psych: Endorses flat mood Heme/lymph/immuno: denies bruises, abnormal bleeding  Physical Exam: Current and past weights: unavailable Constitutional:  NAD General: frail appearing, WNWD EYES: anicteric sclera, lids intact, no discharge  ENMT: intact hearing, oral mucous membranes moist, dentition intact, chews tobacco CV: S1S2, RRR, no LE edema Pulmonary: LCTA, no increased work of breathing, no cough, room  air Abdomen: intake 50%, normo-active BS + 4 quadrants, soft and non tender, no ascites GU: deferred MSK: +sarcopenia, moves all extremities, ambulatory with help Skin: warm and dry, no rashes or wounds on visible skin Neuro:  + generalized weakness,  no cognitive impairment Psych: non-anxious affect, A and O x 3 Hem/lymph/immuno: no widespread bruising   Thank you for the opportunity to participate in the care of Mr. Signorelli.  The palliative care team will continue to follow. Please call our office at [phone] if we can be of additional assistance.   Jason Coop, NP , DNP, MPH, AGPCNP-BC, [facility]  COVID-19 PATIENT SCREENING TOOL Asked and negative response unless otherwise noted:   Have you had symptoms of covid, tested positive or been in contact with someone with symptoms/positive test in the past 5-10 days?

## 2020-09-02 ENCOUNTER — Ambulatory Visit: Payer: Self-pay | Admitting: Urology

## 2020-09-28 ENCOUNTER — Non-Acute Institutional Stay: Payer: Medicare Other | Admitting: Primary Care

## 2020-09-28 ENCOUNTER — Other Ambulatory Visit: Payer: Self-pay

## 2020-09-28 DIAGNOSIS — G8929 Other chronic pain: Secondary | ICD-10-CM | POA: Insufficient documentation

## 2020-09-28 DIAGNOSIS — G35 Multiple sclerosis: Secondary | ICD-10-CM

## 2020-09-28 DIAGNOSIS — R531 Weakness: Secondary | ICD-10-CM

## 2020-09-28 NOTE — Progress Notes (Signed)
[facility] Consult Note Telephone: [phone]  Fax: [phone]    Date of encounter: 09/28/20 PATIENT NAME: [place] [id number]   [phone] (home)  DOB: 17-Jan-1951 MRN: [id number] PRIMARY CARE PROVIDER:    Rica Koyanagi, MD [place] [address] [phone]   REFERRING PROVIDER:  Rica Koyanagi, MD [place] [address] [phone]  RESPONSIBLE PARTY:    Contact Information     Name Relation Home Work [place] Other [phone]     Genia Del [place] Daughter [phone]        I met [facility] to [facility] in [facility]. Palliative Care was asked to follow this patient by consultation request of  Rica Koyanagi, MD  to address advance care planning and complex medical decision making. This is a follow up visit.                                   ASSESSMENT AND PLAN / RECOMMENDATIONS:   Advance Care Planning/Goals of Care: Goals include to maximize quality of life and symptom management.  CODE STATUS: DNR  Symptom Management/Plan:  R shoulder and back spasms. Has been off zanaflex and has not seen any difference. He did not know he had been off for a while. This is a significant pain and impacts his QOL. Recommend deep ultrasound massage per pt several times a week.  I asked PT supervisor to call me to discuss but no call by end of day.   Please change lidocaine patch to am and tape to secure. Today it is not in place at [facility], and scheduled to be placed at 9 am. Patient cannot take gabapentin.  Nutrition: Generally good, snacks a lot. Endorses drinking more water.   Follow up Palliative Care Visit: Palliative care will continue to follow for complex medical decision making, advance care planning, and clarification of goals. Return 4 weeks or prn.  I spent 35 minutes providing this  consultation. More than 50% of the time in this consultation was spent in counseling and care coordination.  PPS: 40%  HOSPICE ELIGIBILITY/DIAGNOSIS: TBD  Chief Complaint: Pain in R shoulder  HISTORY OF PRESENT ILLNESS:  LES LONGMORE is a 70 y.o. year old male  with MS, chronic pain, tobacco use (smokeless) .   History obtained from review of EMR, discussion with primary team, and interview with family, facility staff/caregiver and/or Mr. Stuhr.  I reviewed available labs, medications, imaging, studies and related documents from the EMR.  Records reviewed and summarized above.   ROS   General: NAD ENMT: denies dysphagia Cardiovascular: denies chest pain, denies DOE Pulmonary: denies cough, denies increased SOB Abdomen: endorses fair appetite, denies constipation, endorses continence of bowel GU: denies dysuria, endorses continence of urine MSK:  endorses  weakness,  no falls reported Skin: denies rashes or wounds Neurological: endorses R scapular/shoulder  pain,  from spasm, endorses occ insomnia Psych: Endorses positive mood Heme/lymph/immuno: denies bruises, abnormal bleeding  Physical Exam: Current and past weights: 152 lbs, down from 155 lbs 3 mos ago Constitutional: NAD General: frail appearing, thin EYES: anicteric sclera, lids intact, no discharge  ENMT: intact hearing, oral mucous membranes moist, dentition intact, oral tobacco,  increased breathless/weak with speaking CV: no LE edema Pulmonary: + increased work of breathing while talking,  no cough, room air Abdomen: intake 75%, no ascites GU: deferred MSK: moderate sarcopenia, moves all extremities,  L foot drop brace, ambulatory with walker,  Skin: warm and dry, no rashes or wounds on visible skin Neuro:  + generalized weakness,  no cognitive impairment Psych: anxious affect, A and O x 3 Hem/lymph/immuno: no widespread bruising  Outpatient Encounter Medications as of 09/28/2020  Medication Sig    acidophilus (RISAQUAD) CAPS capsule Take 1 capsule by mouth daily.   Cholecalciferol 250 MCG (10000 UT) TABS Take 1 tablet by mouth daily.   ezetimibe (ZETIA) 10 MG tablet Take 1 tablet (10 mg total) by mouth daily.   ferrous sulfate 325 (65 FE) MG tablet Take 325 mg by mouth daily with breakfast.   finasteride (PROSCAR) 5 MG tablet Take 1 tablet (5 mg total) by mouth daily.   fluticasone (FLONASE) 50 MCG/ACT nasal spray Place 2 sprays into both nostrils daily.   lidocaine (LIDODERM) 5 % Place 1 patch onto the skin daily. Remove & Discard patch within 12 hours or as directed by MD   loratadine (CLARITIN) 10 MG tablet Take 10 mg by mouth daily.   melatonin 3 MG TABS tablet Take 3 mg by mouth at bedtime.   mirtazapine (REMERON) 7.5 MG tablet Take 7.5 mg by mouth at bedtime.   Multiple Vitamin (MULTIVITAMIN) capsule Take 1 capsule by mouth daily.   Polyethyl Glycol-Propyl Glycol (SYSTANE) 0.4-0.3 % SOLN Apply 1 drop to eye in the morning and at bedtime. In both eyes (OU) as needed for dry eyes.   sertraline (ZOLOFT) 25 MG tablet Take 1 tablet (25 mg total) by mouth daily.   vitamin B-12 (CYANOCOBALAMIN) 1000 MCG tablet Take 1 tablet (1,000 mcg total) by mouth daily.   acetaminophen (TYLENOL) 325 MG tablet Take by mouth every 6 (six) hours as needed. As needed for pain   Propylene Glycol 0.6 % SOLN Apply to eye. (Patient not taking: Reported on 09/28/2020)   [DISCONTINUED] tiZANidine (ZANAFLEX) 4 MG tablet TAKE 1 TABLET BY MOUTH NIGHTLY (Patient taking differently: Take 4 mg by mouth at bedtime. )   No facility-administered encounter medications on file as of 09/28/2020.     Thank you for the opportunity to participate in the care of Mr. Montminy.  The palliative care team will continue to follow. Please call our office at [phone] if we can be of additional assistance.   Jason Coop, NP   COVID-19 PATIENT SCREENING TOOL Asked and negative response unless otherwise noted:   Have  you had symptoms of covid, tested positive or been in contact with someone with symptoms/positive test in the past 5-10 days?

## 2020-11-04 ENCOUNTER — Telehealth: Payer: Self-pay | Admitting: Oncology

## 2020-11-04 NOTE — Telephone Encounter (Signed)
Spoke with the scheduler at [facility] who called to schedule an appointment for reported anemia. Confirmed appt day and time to see NP.

## 2020-11-08 ENCOUNTER — Inpatient Hospital Stay: Payer: Medicare Other | Attending: Nurse Practitioner | Admitting: Nurse Practitioner

## 2020-11-08 ENCOUNTER — Encounter: Payer: Self-pay | Admitting: Nurse Practitioner

## 2020-11-08 ENCOUNTER — Inpatient Hospital Stay: Payer: Medicare Other

## 2020-11-08 VITALS — BP 123/57 | HR 60 | Temp 97.8°F | Resp 18 | Wt 153.0 lb

## 2020-11-08 DIAGNOSIS — F17220 Nicotine dependence, chewing tobacco, uncomplicated: Secondary | ICD-10-CM | POA: Insufficient documentation

## 2020-11-08 DIAGNOSIS — D591 Autoimmune hemolytic anemia, unspecified: Secondary | ICD-10-CM | POA: Diagnosis present

## 2020-11-08 DIAGNOSIS — G35 Multiple sclerosis: Secondary | ICD-10-CM | POA: Diagnosis not present

## 2020-11-08 DIAGNOSIS — D75839 Thrombocytosis, unspecified: Secondary | ICD-10-CM | POA: Diagnosis not present

## 2020-11-08 LAB — COMPREHENSIVE METABOLIC PANEL
ALT: 13 U/L (ref 0–44)
AST: 18 U/L (ref 15–41)
Albumin: 4.2 g/dL (ref 3.5–5.0)
Alkaline Phosphatase: 107 U/L (ref 38–126)
Anion gap: 9 (ref 5–15)
BUN: 17 mg/dL (ref 8–23)
CO2: 25 mmol/L (ref 22–32)
Calcium: 9 mg/dL (ref 8.9–10.3)
Chloride: 104 mmol/L (ref 98–111)
Creatinine, Ser: 1.14 mg/dL (ref 0.61–1.24)
GFR, Estimated: >60 mL/min (ref >60)
Glucose, Bld: 83 mg/dL (ref 70–99)
Potassium: 3.9 mmol/L (ref 3.5–5.1)
Sodium: 138 mmol/L (ref 135–145)
Total Bilirubin: 2.7 mg/dL — ABNORMAL HIGH (ref 0.3–1.2)
Total Protein: 7.1 g/dL (ref 6.5–8.1)

## 2020-11-08 LAB — CBC WITH DIFFERENTIAL/PLATELET
Abs Immature Granulocytes: 0.08 10*3/uL — ABNORMAL HIGH (ref 0.00–0.07)
Basophils Absolute: 0.1 10*3/uL (ref 0.0–0.1)
Basophils Relative: 1 %
Eosinophils Absolute: 0.1 10*3/uL (ref 0.0–0.5)
Eosinophils Relative: 1 %
HCT: 30.4 % — ABNORMAL LOW (ref 39.0–52.0)
Hemoglobin: 10.4 g/dL — ABNORMAL LOW (ref 13.0–17.0)
Immature Granulocytes: 1 %
Lymphocytes Relative: 22 %
Lymphs Abs: 1.5 10*3/uL (ref 0.7–4.0)
MCH: 34.3 pg — ABNORMAL HIGH (ref 26.0–34.0)
MCHC: 34.2 g/dL (ref 30.0–36.0)
MCV: 100.3 fL — ABNORMAL HIGH (ref 80.0–100.0)
Monocytes Absolute: 0.6 10*3/uL (ref 0.1–1.0)
Monocytes Relative: 9 %
Neutro Abs: 4.4 10*3/uL (ref 1.7–7.7)
Neutrophils Relative %: 66 %
Platelets: 398 10*3/uL (ref 150–400)
RBC: 3.03 MIL/uL — ABNORMAL LOW (ref 4.22–5.81)
RDW: 14.4 % (ref 11.5–15.5)
WBC: 6.7 10*3/uL (ref 4.0–10.5)
nRBC: 0 % (ref 0.0–0.2)

## 2020-11-08 LAB — RETICULOCYTES
Immature Retic Fract: 18.2 % — ABNORMAL HIGH (ref 2.3–15.9)
RBC.: 3.05 MIL/uL — ABNORMAL LOW (ref 4.22–5.81)
Retic Count, Absolute: 136.6 10*3/uL (ref 19.0–186.0)
Retic Ct Pct: 4.5 % — ABNORMAL HIGH (ref 0.4–3.1)

## 2020-11-08 NOTE — Progress Notes (Signed)
Pt in for follow up, has not been since 2021.  Pt is a palliative care pt at [facility] and a pt of Dr Janese Banks.  [facility] requested pt be reevaluated for anemia concerns per NP at facility.

## 2020-11-08 NOTE — Progress Notes (Signed)
Hematology/Oncology Consult note [facility]  Telephone:([phone] [phone]  Date of Visit: 11/08/20  Clinic: Hematology  Chief Complaint: Routine follow-up of hemolytic anemia  History of present illness: patient is a 70 year old male who sees [name] for multiple sclerosis. He is currently on prednisone taper and also takes Aricept. Recent CBC from 09/04/2016 showed white count of 7.9, H&H of 10.5/30.8 with an MCV of 97.2 and a platelet count of 396. Cold agglutinin/cryoglobulin was sus recent B12, TSH and hepatic function panel was normal.  iron study showed an increased iron saturation of 61.5%. Ferritin was 175 and folate was within normal limits.    Patient has secondary progressive MS diagnosed in 1980 now gradually getting worse. He lives alone and uses walker for ambulation. He thinks he is doing well staying alone. But his guardians think otherwise and feel he is failing slowly and needs more help   Results of bloodwork from 10/11/2016 were as follows: CBC showed white count of 6.9, H&H of 10.1/27.8 and a platelet count of 331. Iron studies were within normal limits. CMP was significant for elevated creatinine of 1.3 and elevated total bilirubin of 2.8.Marland Kitchen ANA was negative and C3 and C4 were within normal limits. HIV antibody was negative. Hepatitis B and C testing was negative. Multiple myeloma panel revealed no monoclonal protein. Rheumatoid factor was negative and ESR was mildly elevated at 23. CRP was elevated at 3.1. Haptoglobin was less than 10. Reticulocyte count was normal at 2.7%.   Review of peripheral blood smear revealed prominent RBC agglutination. 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   CT chest abdomen and pelvis without contrast did not reveal any evidence of adenopathy or malignancy.   Patient has  been on observation for his hemolytic anemia and his hemoglobin has remained stable around 10 but with evidence of active hemolysis and has not required any treatment so far    Interval history: Patient returns to clinic for follow up for history of hemolytic anemia. He has MS and is followed by [facility] and Palliative care. He feels at baseline and denies new or specific complaints today. His MS is stable. He is accompanied by his Ursula Beath, who contributes to history and ROS and says he's doing well.   Review of Systems  Constitutional:  Positive for malaise/fatigue. Negative for chills, fever and weight loss.  HENT:  Negative for congestion, ear discharge and nosebleeds.   Eyes:  Negative for blurred vision.  Respiratory:  Negative for cough, hemoptysis, sputum production, shortness of breath and wheezing.   Cardiovascular:  Negative for chest pain, palpitations, orthopnea and claudication.  Gastrointestinal:  Negative for abdominal pain, blood in stool, constipation, diarrhea, heartburn, melena, nausea and vomiting.  Genitourinary:  Negative for dysuria, flank pain, frequency, hematuria and urgency.  Musculoskeletal:  Negative for back pain, joint pain and myalgias.  Skin:  Negative for rash.  Neurological:  Negative for dizziness, tingling, focal weakness, seizures, weakness and headaches.  Endo/Heme/Allergies:  Does not bruise/bleed easily.  Psychiatric/Behavioral:  Negative for depression and suicidal ideas. The patient does not have insomnia.    Allergies  Allergen Reactions   Gabapentin     Other reaction(s): Dizziness   Pregabalin     Other reaction(s): Dizziness    Past Medical History:  Diagnosis Date   Depression    Hypercholesterolemia    Hypertension    Multiple sclerosis (  [place])    Sees Dr Ala Bent [facility])   Prostate cancer [facility])    Followed by Dr [name] and Dr [name]    Past Surgical History:  Procedure Laterality Date   KNEE SURGERY      Social  History   Socioeconomic History   Marital status: Divorced    Spouse name: Not on file   Number of children: Not on file   Years of education: Not on file   Highest education level: Not on file  Occupational History   Not on file  Tobacco Use   Smoking status: Never   Smokeless tobacco: Current    Types: Chew   Tobacco comments:    Is going to try and cut down on the amount.   Vaping Use   Vaping Use: Never used  Substance and Sexual Activity   Alcohol use: No    Alcohol/week: 2.0 standard drinks    Types: 2 Cans of beer per week   Drug use: No   Sexual activity: Not Currently  Other Topics Concern   Not on file  Social History Narrative   Not on file   Social Determinants of Health   Financial Resource Strain: Not on file  Food Insecurity: Not on file  Transportation Needs: Not on file  Physical Activity: Not on file  Stress: Not on file  Social Connections: Not on file  Intimate Partner Violence: Not on file    Family History  Problem Relation Age of Onset   Hypertension Mother    Stroke Father    Diabetes Maternal Grandmother    Diabetes Maternal Grandfather    Leukemia Sister        died - age 73     Current Outpatient Medications:    acetaminophen (TYLENOL) 325 MG tablet, Take by mouth every 6 (six) hours as needed. As needed for pain, Disp: , Rfl:    acidophilus (RISAQUAD) CAPS capsule, Take 1 capsule by mouth daily., Disp: , Rfl:    ezetimibe (ZETIA) 10 MG tablet, Take 1 tablet (10 mg total) by mouth daily., Disp: 90 tablet, Rfl: 1   ferrous sulfate 325 (65 FE) MG tablet, Take 325 mg by mouth daily with breakfast., Disp: , Rfl:    finasteride (PROSCAR) 5 MG tablet, Take 1 tablet (5 mg total) by mouth daily., Disp: 90 tablet, Rfl: 3   fluticasone (FLONASE) 50 MCG/ACT nasal spray, Place 2 sprays into both nostrils daily., Disp: 16 g, Rfl: 6   lidocaine (LIDODERM) 5 %, Place 1 patch onto the skin daily. Remove & Discard patch within 12 hours or as directed  by MD, Disp: , Rfl:    loratadine (CLARITIN) 10 MG tablet, Take 10 mg by mouth daily., Disp: , Rfl:    losartan (COZAAR) 25 MG tablet, Take 25 mg by mouth daily., Disp: , Rfl:    melatonin 3 MG TABS tablet, Take 3 mg by mouth at bedtime., Disp: , Rfl:    mirtazapine (REMERON) 7.5 MG tablet, Take 7.5 mg by mouth at bedtime., Disp: , Rfl:    Multiple Vitamin (MULTIVITAMIN) capsule, Take 1 capsule by mouth daily., Disp: , Rfl:    Polyethyl Glycol-Propyl Glycol (SYSTANE) 0.4-0.3 % SOLN, Apply 1 drop to eye in the morning and at bedtime. In both eyes (OU) as needed for dry eyes., Disp: , Rfl:    sertraline (ZOLOFT) 25 MG tablet, Take 1 tablet (25 mg total) by mouth daily., Disp: 90 tablet, Rfl: 1   vitamin B-12 (CYANOCOBALAMIN)  1000 MCG tablet, Take 1 tablet (1,000 mcg total) by mouth daily., Disp: 90 tablet, Rfl: 3   Cholecalciferol 250 MCG (10000 UT) TABS, Take 1 tablet by mouth daily. (Patient not taking: Reported on 11/08/2020), Disp: , Rfl:   No results found.  No images are attached to the encounter.   CMP Latest Ref Rng & Units 11/08/2020  Glucose 70 - 99 mg/dL 83  BUN 8 - 23 mg/dL 17  Creatinine 0.61 - 1.24 mg/dL 1.14  Sodium 135 - 145 mmol/L 138  Potassium 3.5 - 5.1 mmol/L 3.9  Chloride 98 - 111 mmol/L 104  CO2 22 - 32 mmol/L 25  Calcium 8.9 - 10.3 mg/dL 9.0  Total Protein 6.5 - 8.1 g/dL 7.1  Total Bilirubin 0.3 - 1.2 mg/dL 2.7(H)  Alkaline Phos 38 - 126 U/L 107  AST 15 - 41 U/L 18  ALT 0 - 44 U/L 13   CBC Latest Ref Rng & Units 11/08/2020  WBC 4.0 - 10.5 K/uL 6.7  Hemoglobin 13.0 - 17.0 g/dL 10.4(L)  Hematocrit 39.0 - 52.0 % 30.4(L)  Platelets 150 - 400 K/uL 398     Observation/objective:  Today's Vitals   11/08/20 1101 11/08/20 1105  BP:  (!) 123/57  Pulse:  60  Resp:  18  Temp:  97.8 F (36.6 C)  TempSrc:  Tympanic  SpO2:  100%  Weight:  153 lb (69.4 kg)  PainSc: 0-No pain 0-No pain   Body mass index is 22.59 kg/m. Physical Exam Constitutional:       General: He is not in acute distress.    Appearance: He is well-developed.  HENT:     Head: Normocephalic and atraumatic.  Eyes:     General: No scleral icterus. Cardiovascular:     Rate and Rhythm: Normal rate and regular rhythm.     Heart sounds: Normal heart sounds.  Pulmonary:     Effort: Pulmonary effort is normal.     Breath sounds: Normal breath sounds. No wheezing.  Abdominal:     Tenderness: There is no guarding.  Musculoskeletal:     Cervical back: Normal range of motion and neck supple.     Comments: wheelchair  Skin:    General: Skin is warm and dry.     Findings: No bruising.  Neurological:     Mental Status: He is alert and oriented to person, place, and time.  Psychiatric:        Behavior: Behavior normal.    CBC Latest Ref Rng & Units 11/08/2020 03/13/2019 01/22/2019  WBC 4.0 - 10.5 K/uL 6.7 9.0 6.8  Hemoglobin 13.0 - 17.0 g/dL 10.4(L) 9.2(L) 9.9(L)  Hematocrit 39.0 - 52.0 % 30.4(L) 26.3(L) 29.0(L)  Platelets 150 - 400 K/uL 398 409(H) 327   CMP Latest Ref Rng & Units 11/08/2020 03/13/2019 01/22/2019  Glucose 70 - 99 mg/dL 83 88 91  BUN 8 - 23 mg/dL '17 17 17  ' Creatinine 0.61 - 1.24 mg/dL 1.14 1.08 1.25(H)  Sodium 135 - 145 mmol/L 138 138 137  Potassium 3.5 - 5.1 mmol/L 3.9 4.5 3.1(L)  Chloride 98 - 111 mmol/L 104 108 102  CO2 22 - 32 mmol/L 25 20(L) 24  Calcium 8.9 - 10.3 mg/dL 9.0 8.6(L) 8.6(L)  Total Protein 6.5 - 8.1 g/dL 7.1 - -  Total Bilirubin 0.3 - 1.2 mg/dL 2.7(H) - -  Alkaline Phos 38 - 126 U/L 107 - -  AST 15 - 41 U/L 18 - -  ALT 0 - 44 U/L 13 - -  Assessment and plan:   Autoimmune Hemolytic anemia - patient has not yet required treatment - H&H has been in the 9 range. Bilirubin previously 2.1. Sed rate was mildly elevated at 121. Haptoglobin < 10, reticulocyte count was 5.6% which is indicative of ongoing hemolysis but hemoglobin has been stable in the 9-10 range for the past year.  - previously discussed that treatment would be  steroids or rituxan if his hemoglobin downtrends to 8 range.  - Patient has been having labs with PCP  - Hemoglobin today 10.4. Improved.  - reticulocytes persistently elevated. Continue to monitor as hemoglobin is improved.  - patient prefers to have labs monitored by his pcp and will return to the cancer center if hemoglobin decreases. Recommend checking labs every 4-6 months to ensure stability.   Thrombocytosis - platelet counts previously 525.  - resolved. Platelets 398.   Disposition:  Labs today. Return to clinic prn at patient request.   Visit Diagnosis: 1. Autoimmune hemolytic anemia ([place])     Beckey Rutter, DNP, AGNP-C [place] [facility] [phone] (clinic) 11/08/2020 3:52 PM

## 2020-11-09 LAB — HAPTOGLOBIN: Haptoglobin: <10 mg/dL — ABNORMAL LOW (ref 34–355)

## 2020-12-30 ENCOUNTER — Other Ambulatory Visit: Payer: Self-pay

## 2020-12-30 ENCOUNTER — Non-Acute Institutional Stay: Payer: 59 | Admitting: Primary Care

## 2020-12-30 DIAGNOSIS — M549 Dorsalgia, unspecified: Secondary | ICD-10-CM

## 2020-12-30 DIAGNOSIS — Z515 Encounter for palliative care: Secondary | ICD-10-CM

## 2020-12-30 DIAGNOSIS — R531 Weakness: Secondary | ICD-10-CM

## 2020-12-30 DIAGNOSIS — G8929 Other chronic pain: Secondary | ICD-10-CM

## 2020-12-30 DIAGNOSIS — G35 Multiple sclerosis: Secondary | ICD-10-CM

## 2020-12-30 NOTE — Progress Notes (Signed)
[facility] Consult Note Telephone: [phone]  Fax: [phone]    Date of encounter: 12/30/20 12:43 PM PATIENT NAME: Philip Gordon [address] [place] [address]   [phone] (home)  DOB: 1950/04/05 MRN: [id number] PRIMARY CARE PROVIDER:    Rica Koyanagi, MD,  [place] [address] [phone]  REFERRING PROVIDER:   Rica Koyanagi, [place],  [place] [address] [phone]  RESPONSIBLE PARTY:    Contact Information     Name Relation Home Work [place] Other [phone]     Genia Del [phone]     Denard,Lindsay Daughter [phone]          I met [facility] to face [facility] in [facility]. Palliative Care was asked to follow this patient by consultation request of  Rica Koyanagi, MD to address advance care planning and complex medical decision making. This is a follow up visit.                                   ASSESSMENT AND PLAN / RECOMMENDATIONS:   Advance Care Planning/Goals of Care: Goals include to maximize quality of life and symptom management. Our advance care planning conversation included a discussion about:     Exploration of personal, cultural or spiritual beliefs that might influence medical decisions   CODE STATUS: DNR  Symptom Management/Plan:  Met with patient in his snf room. He had a fall this week which he describes as 'sitting down'. He denies injury and he also was not sent out for assessment.  He had PT services in July and Aug but states they did not do massage on his R shoulder. He endorses continued chronic pain there. He has been given tramadol and zanaflex which appear to be modulating pain some.  He states his intake is WNL when he likes the food.  Maintaining weight and has snacks.  Follow up Palliative Care Visit: Palliative care will continue to follow for complex medical decision making, advance care  planning, and clarification of goals. Return 6-8 weeks or prn.  I spent 25 minutes providing this consultation. More than 50% of the time in this consultation was spent in counseling and care coordination.  PPS: 40%  HOSPICE ELIGIBILITY/DIAGNOSIS: TBD  Chief Complaint: debility  HISTORY OF PRESENT ILLNESS:  Philip Gordon is a 70 y.o. year old male  with debility, MS, recent fall .   History obtained from review of EMR, discussion with primary team, and interview with family, facility staff/caregiver and/or Philip Gordon.  I reviewed available labs, medications, imaging, studies and related documents from the EMR.  Records reviewed and summarized above.   ROS  General: NAD EYES: denies vision changes ENMT: denies dysphagia Cardiovascular: denies chest pain, denies DOE Pulmonary: denies cough, denies increased SOB Abdomen: endorses good appetite, denies constipation, endorses continence of bowel GU: denies dysuria, endorses continence of urine MSK:  endorses  weakness,  1 fall reported Skin: denies rashes or wounds Neurological: denies pain, denies insomnia, endorses weakness Psych: Endorses positive mood Heme/lymph/immuno: denies bruises, abnormal bleeding  Physical Exam: Current and past weights: 154 lbs. Constitutional: NAD General: frail appearing, wnwd EYES: anicteric sclera, lids intact, no discharge  ENMT: intact hearing, oral mucous membranes moist, dentition intact Pulmonary:  no increased work of breathing, no cough, room air Abdomen: intake 75%,  no ascites MSK: + sarcopenia,  moves all extremities, ambulatory with walker, appears more weak than in past, voice less strong Skin: warm and dry, no rashes or wounds on visible skin Neuro:  ++ generalized weakness,  no cognitive impairment Psych: non-anxious affect, A and O x 3 Hem/lymph/immuno: no widespread bruising  Outpatient Encounter Medications as of 12/30/2020  Medication Sig   acetaminophen (TYLENOL) 325  MG tablet Take by mouth every 6 (six) hours as needed. As needed for pain   acidophilus (RISAQUAD) CAPS capsule Take 1 capsule by mouth daily.   Cetirizine HCl (ZYRTEC ALLERGY) 10 MG CAPS Take by mouth.   Cholecalciferol 250 MCG (10000 UT) TABS Take 1 tablet by mouth daily.   ezetimibe (ZETIA) 10 MG tablet Take 1 tablet (10 mg total) by mouth daily.   ferrous sulfate 325 (65 FE) MG tablet Take 325 mg by mouth daily with breakfast.   finasteride (PROSCAR) 5 MG tablet Take 1 tablet (5 mg total) by mouth daily.   fluticasone (FLONASE) 50 MCG/ACT nasal spray Place 2 sprays into both nostrils daily.   lidocaine (LIDODERM) 5 % Place 1 patch onto the skin daily. Remove & Discard patch within 12 hours or as directed by MD   losartan (COZAAR) 25 MG tablet Take 25 mg by mouth daily.   melatonin 3 MG TABS tablet Take 3 mg by mouth at bedtime.   mirtazapine (REMERON) 7.5 MG tablet Take 7.5 mg by mouth at bedtime.   Multiple Vitamin (MULTIVITAMIN) capsule Take 1 capsule by mouth daily.   Polyethyl Glycol-Propyl Glycol (SYSTANE) 0.4-0.3 % SOLN Apply 1 drop to eye in the morning and at bedtime. In both eyes (OU) as needed for dry eyes.   sertraline (ZOLOFT) 25 MG tablet Take 1 tablet (25 mg total) by mouth daily.   tiZANidine (ZANAFLEX) 2 MG tablet Take 2 mg by mouth every 8 (eight) hours as needed for muscle spasms.   traMADol (ULTRAM) 50 MG tablet Take by mouth every 6 (six) hours as needed.   vitamin B-12 (CYANOCOBALAMIN) 1000 MCG tablet Take 1 tablet (1,000 mcg total) by mouth daily.   [DISCONTINUED] loratadine (CLARITIN) 10 MG tablet Take 10 mg by mouth daily.   No facility-administered encounter medications on file as of 12/30/2020.    Thank you for the opportunity to participate in the care of Philip Gordon.  The palliative care team will continue to follow. Please call our office at [phone] if we can be of additional assistance.   Jason Coop, NP DNP, AGPCNP-BC  COVID-19 PATIENT  SCREENING TOOL Asked and negative response unless otherwise noted:   Have you had symptoms of covid, tested positive or been in contact with someone with symptoms/positive test in the past 5-10 days?

## 2021-02-10 ENCOUNTER — Ambulatory Visit (INDEPENDENT_AMBULATORY_CARE_PROVIDER_SITE_OTHER): Payer: Medicare Other | Admitting: Cardiology

## 2021-02-10 ENCOUNTER — Ambulatory Visit (INDEPENDENT_AMBULATORY_CARE_PROVIDER_SITE_OTHER): Payer: Medicare Other

## 2021-02-10 ENCOUNTER — Encounter: Payer: Self-pay | Admitting: Cardiology

## 2021-02-10 ENCOUNTER — Other Ambulatory Visit: Payer: Self-pay

## 2021-02-10 VITALS — BP 136/62 | HR 88 | Ht 69.0 in | Wt 154.0 lb

## 2021-02-10 DIAGNOSIS — I10 Essential (primary) hypertension: Secondary | ICD-10-CM | POA: Diagnosis not present

## 2021-02-10 DIAGNOSIS — I493 Ventricular premature depolarization: Secondary | ICD-10-CM | POA: Diagnosis not present

## 2021-02-10 DIAGNOSIS — R001 Bradycardia, unspecified: Secondary | ICD-10-CM | POA: Diagnosis not present

## 2021-02-10 DIAGNOSIS — R0602 Shortness of breath: Secondary | ICD-10-CM

## 2021-02-10 NOTE — Progress Notes (Signed)
Cardiology Office Note:    Date:  02/10/2021   ID:  Philip Gordon, DOB 11-10-1950, MRN [id number]  PCP:  Rica Koyanagi, MD   [facility] Cardiologist:  Kate Sable, MD     Referring MD: Rica Koyanagi, MD   Chief Complaint  Patient presents with   New Patient (Initial Visit)    Referred by PCP for Bradycardia. Meds reviewed verbally with patient.    Philip Gordon is a 70 y.o. male who is being seen today for the evaluation of bradycardia at the request of Rica Koyanagi, MD.   History of Present Illness:    Philip Gordon is a 70 y.o. male with a hx of hypertension, MS who presents due to bradycardia.  Patient lives in a skilled nursing facility.  Recent pulse checks revealed low heart rates in the 30s.  He denies chest pain or shortness of breath.  He ambulates with a walker, has a history of falls.  Follows up with hematology for MS.  Presents today with the wife of his cousin who is his POA.  He denies any history of heart disease, denies edema.  Sometimes gets short of breath when he overexerts himself during physical therapy.  Past Medical History:  Diagnosis Date   Depression    Hypercholesterolemia    Hypertension    Multiple sclerosis (HCC)    Sees Dr Ala Bent [facility])   Prostate cancer [facility])    Followed by Dr [name] and Dr [name]    Past Surgical History:  Procedure Laterality Date   KNEE SURGERY      Current Medications: Current Meds  Medication Sig   acetaminophen (TYLENOL) 325 MG tablet Take by mouth every 6 (six) hours as needed. As needed for pain   acidophilus (RISAQUAD) CAPS capsule Take 1 capsule by mouth daily.   Cetirizine HCl (ZYRTEC ALLERGY) 10 MG CAPS Take by mouth.   Cholecalciferol 250 MCG (10000 UT) TABS Take 1 tablet by mouth daily.   ezetimibe (ZETIA) 10 MG tablet Take 1 tablet (10 mg total) by mouth daily.   ferrous sulfate 325 (65 FE) MG tablet Take 325 mg by mouth daily with breakfast.   finasteride  (PROSCAR) 5 MG tablet Take 1 tablet (5 mg total) by mouth daily.   fluticasone (FLONASE) 50 MCG/ACT nasal spray Place 2 sprays into both nostrils daily.   lidocaine (LIDODERM) 5 % Place 1 patch onto the skin daily. Remove & Discard patch within 12 hours or as directed by MD   losartan (COZAAR) 25 MG tablet Take 25 mg by mouth daily.   melatonin 3 MG TABS tablet Take 3 mg by mouth at bedtime.   mirtazapine (REMERON) 7.5 MG tablet Take 7.5 mg by mouth at bedtime.   Multiple Vitamin (MULTIVITAMIN) capsule Take 1 capsule by mouth daily.   Polyethyl Glycol-Propyl Glycol (SYSTANE) 0.4-0.3 % SOLN Apply 1 drop to eye in the morning and at bedtime. In both eyes (OU) as needed for dry eyes.   sertraline (ZOLOFT) 25 MG tablet Take 1 tablet (25 mg total) by mouth daily.   tiZANidine (ZANAFLEX) 2 MG tablet Take 2 mg by mouth every 8 (eight) hours as needed for muscle spasms.   traMADol (ULTRAM) 50 MG tablet Take by mouth every 6 (six) hours as needed.   vitamin B-12 (CYANOCOBALAMIN) 1000 MCG tablet Take 1 tablet (1,000 mcg total) by mouth daily.     Allergies:   Gabapentin and Pregabalin   Social History   Socioeconomic  History   Marital status: Divorced    Spouse name: Not on file   Number of children: Not on file   Years of education: Not on file   Highest education level: Not on file  Occupational History   Not on file  Tobacco Use   Smoking status: Never   Smokeless tobacco: Current    Types: Chew   Tobacco comments:    Is going to try and cut down on the amount.   Vaping Use   Vaping Use: Never used  Substance and Sexual Activity   Alcohol use: No    Alcohol/week: 2.0 standard drinks    Types: 2 Cans of beer per week   Drug use: No   Sexual activity: Not Currently  Other Topics Concern   Not on file  Social History Narrative   Not on file   Social Determinants of Health   Financial Resource Strain: Not on file  Food Insecurity: Not on file  Transportation Needs: Not on file   Physical Activity: Not on file  Stress: Not on file  Social Connections: Not on file     Family History: The patient's family history includes Diabetes in his maternal grandfather and maternal grandmother; Hypertension in his mother; Leukemia in his sister; Stroke in his father.  ROS:   Please see the history of present illness.     All other systems reviewed and are negative.  EKGs/Labs/Other Studies Reviewed:    The following studies were reviewed today:   EKG:  EKG is  ordered today.  The ekg ordered today demonstrates sinus rhythm, PVCs.  Recent Labs: 11/08/2020: ALT 13; BUN 17; Creatinine, Ser 1.14; Hemoglobin 10.4; Platelets 398; Potassium 3.9; Sodium 138  Recent Lipid Panel    Component Value Date/Time   CHOL 145 03/31/2018 1429   TRIG 130.0 03/31/2018 1429   HDL 44.70 03/31/2018 1429   CHOLHDL 3 03/31/2018 1429   VLDL 26.0 03/31/2018 1429   LDLCALC 74 03/31/2018 1429   LDLDIRECT 129.6 01/30/2013 0946     Risk Assessment/Calculations:          Physical Exam:    VS:  BP 136/62 (BP Location: Left Arm, Patient Position: Sitting, Cuff Size: Normal)   Pulse 88   Ht 5\' 9"  (1.753 m)   Wt 154 lb (69.9 kg)   SpO2 97%   BMI 22.74 kg/m     Wt Readings from Last 3 Encounters:  02/10/21 154 lb (69.9 kg)  11/08/20 153 lb (69.4 kg)  09/03/19 138 lb (62.6 kg)     GEN:  Well nourished, soft-spoken HEENT: Normal NECK: No JVD; No carotid bruits LYMPHATICS: No lymphadenopathy CARDIAC: No murmurs, occasional skipped heartbeats, RESPIRATORY:  Clear to auscultation without rales, wheezing or rhonchi  ABDOMEN: Soft, non-tender, non-distended MUSCULOSKELETAL:  No edema; No deformity  SKIN: Warm and dry NEUROLOGIC:  Alert and oriented x 3 PSYCHIATRIC:  Normal affect   ASSESSMENT:    1. Bradycardia   2. Frequent PVCs   3. Shortness of breath   4. Primary hypertension    PLAN:    In order of problems listed above:  Bradycardia, place cardiac monitor to  evaluate any significant heart blocks or arrhythmias.  It is possible finger pulse ox was picking up PVCs as such incorrectly making heart rate lower. Frequent PVCs, shortness of breath, get echocardiogram to evaluate any structural abnormalities. Hypertension, BP controlled on losartan.  Continue losartan 25 mg daily.  Follow-up after echocardiogram and cardiac monitor.  Medication Adjustments/Labs and Tests Ordered: Current medicines are reviewed at length with the patient today.  Concerns regarding medicines are outlined above.  Orders Placed This Encounter  Procedures   LONG TERM MONITOR (3-14 DAYS)   EKG 12-Lead   ECHOCARDIOGRAM COMPLETE   No orders of the defined types were placed in this encounter.   Patient Instructions  Medication Instructions:  Your physician recommends that you continue on your current medications as directed. Please refer to the Current Medication list given to you today.  *If you need a refill on your cardiac medications before your next appointment, please call your pharmacy*   Lab Work: None ordered If you have labs (blood work) drawn today and your tests are completely normal, you will receive your results only by: Philip Gordon (if you have MyChart) OR A paper copy in the mail If you have any lab test that is abnormal or we need to change your treatment, we will call you to review the results.   Testing/Procedures: Your physician has requested that you have an echocardiogram. Echocardiography is a painless test that uses sound waves to create images of your heart. It provides your doctor with information about the size and shape of your heart and how well your heart's chambers and valves are working. This procedure takes approximately one hour. There are no restrictions for this procedure.  Your physician has recommended that you wear a Zio monitor to be worn for 14 days. The monitor will be mailed to you. Please follow the application and  the return instructions that will arrive with the monitor.  This monitor is a medical device that records the heart's electrical activity. Doctors most often use these monitors to diagnose arrhythmias. Arrhythmias are problems with the speed or rhythm of the heartbeat. The monitor is a small device applied to your chest. You can wear one while you do your normal daily activities. While wearing this monitor if you have any symptoms to push the button and record what you felt. Once you have worn this monitor for the period of time provider prescribed (Usually 14 days), you will return the monitor device in the postage paid box. Once it is returned they will download the data collected and provide [place] with a report which the provider will then review and we will call you with those results. Important tips:  Avoid showering during the first 24 hours of wearing the monitor. Avoid excessive sweating to help maximize wear time. Do not submerge the device, no hot tubs, and no swimming pools. Keep any lotions or oils away from the patch. After 24 hours you may shower with the patch on. Take brief showers with your back facing the shower head.  Do not remove patch once it has been placed because that will interrupt data and decrease adhesive wear time. Push the button when you have any symptoms and write down what you were feeling. Once you have completed wearing your monitor, remove and place into box which has postage paid and place in your outgoing mailbox.  If for some reason you have misplaced your box then call our office and we can provide another box and/or mail it off for you.      Follow-Up: At [facility], you and your health needs are our priority.  As part of our continuing mission to provide you with exceptional heart care, we have created designated Provider Care Teams.  These Care Teams include your primary Cardiologist (physician) and Advanced Practice Providers (APPs -  Physician  Assistants and Nurse Practitioners) who all work together to provide you with the care you need, when you need it.  We recommend signing up for the patient portal called "MyChart".  Sign up information is provided on this After Visit Summary.  MyChart is used to connect with patients for Virtual Visits (Telemedicine).  Patients are able to view lab/test results, encounter notes, upcoming appointments, etc.  Non-urgent messages can be sent to your provider as well.   To learn more about what you can do with MyChart, go to [email or web address].    Your next appointment:   5-6 week(s)  The format for your next appointment:   In Person  Provider:   You may see Kate Sable, MD or one of the following Advanced Practice Providers on your designated Care Team:   Murray Hodgkins, NP Christell Faith, PA-C Cadence Kathlen Mody, [place]    Other Instructions N/A   Signed, Kate Sable, MD  02/10/2021 12:33 PM    [place]

## 2021-02-10 NOTE — Patient Instructions (Signed)
Medication Instructions:  Your physician recommends that you continue on your current medications as directed. Please refer to the Current Medication list given to you today.  *If you need a refill on your cardiac medications before your next appointment, please call your pharmacy*   Lab Work: None ordered If you have labs (blood work) drawn today and your tests are completely normal, you will receive your results only by: [facility] (if you have MyChart) OR A paper copy in the mail If you have any lab test that is abnormal or we need to change your treatment, we will call you to review the results.   Testing/Procedures: Your physician has requested that you have an echocardiogram. Echocardiography is a painless test that uses sound waves to create images of your heart. It provides your doctor with information about the size and shape of your heart and how well your heart's chambers and valves are working. This procedure takes approximately one hour. There are no restrictions for this procedure.  Your physician has recommended that you wear a Zio monitor to be worn for 14 days. The monitor will be mailed to you. Please follow the application and the return instructions that will arrive with the monitor.  This monitor is a medical device that records the heart's electrical activity. Doctors most often use these monitors to diagnose arrhythmias. Arrhythmias are problems with the speed or rhythm of the heartbeat. The monitor is a small device applied to your chest. You can wear one while you do your normal daily activities. While wearing this monitor if you have any symptoms to push the button and record what you felt. Once you have worn this monitor for the period of time provider prescribed (Usually 14 days), you will return the monitor device in the postage paid box. Once it is returned they will download the data collected and provide [place] with a report which the provider will then review and  we will call you with those results. Important tips:  Avoid showering during the first 24 hours of wearing the monitor. Avoid excessive sweating to help maximize wear time. Do not submerge the device, no hot tubs, and no swimming pools. Keep any lotions or oils away from the patch. After 24 hours you may shower with the patch on. Take brief showers with your back facing the shower head.  Do not remove patch once it has been placed because that will interrupt data and decrease adhesive wear time. Push the button when you have any symptoms and write down what you were feeling. Once you have completed wearing your monitor, remove and place into box which has postage paid and place in your outgoing mailbox.  If for some reason you have misplaced your box then call our office and we can provide another box and/or mail it off for you.      Follow-Up: At [facility], you and your health needs are our priority.  As part of our continuing mission to provide you with exceptional heart care, we have created designated Provider Care Teams.  These Care Teams include your primary Cardiologist (physician) and Advanced Practice Providers (APPs -  Physician Assistants and Nurse Practitioners) who all work together to provide you with the care you need, when you need it.  We recommend signing up for the patient portal called "MyChart".  Sign up information is provided on this After Visit Summary.  MyChart is used to connect with patients for Virtual Visits (Telemedicine).  Patients are able to  view lab/test results, encounter notes, upcoming appointments, etc.  Non-urgent messages can be sent to your provider as well.   To learn more about what you can do with MyChart, go to [email or web address].    Your next appointment:   5-6 week(s)  The format for your next appointment:   In Person  Provider:   You may see Kate Sable, MD or one of the following Advanced Practice Providers on your  designated Care Team:   Murray Hodgkins, NP Christell Faith, PA-C Cadence Kathlen Mody, [place]    Other Instructions N/A

## 2021-02-14 DIAGNOSIS — I493 Ventricular premature depolarization: Secondary | ICD-10-CM | POA: Diagnosis not present

## 2021-02-14 DIAGNOSIS — R001 Bradycardia, unspecified: Secondary | ICD-10-CM

## 2021-02-21 ENCOUNTER — Ambulatory Visit: Payer: BLUE CROSS/BLUE SHIELD

## 2021-03-03 ENCOUNTER — Other Ambulatory Visit: Payer: Self-pay | Admitting: Cardiology

## 2021-03-03 DIAGNOSIS — I493 Ventricular premature depolarization: Secondary | ICD-10-CM

## 2021-03-10 ENCOUNTER — Telehealth: Payer: Self-pay

## 2021-03-10 DIAGNOSIS — I471 Supraventricular tachycardia: Secondary | ICD-10-CM

## 2021-03-10 MED ORDER — METOPROLOL SUCCINATE ER 25 MG PO TB24: 25.0000 mg | ORAL_TABLET | Freq: Every day | ORAL | Status: DC

## 2021-03-10 NOTE — Telephone Encounter (Signed)
Called patients POA Spero Curb and discussed result note. She advised me to reach out to [facility] where the patient is residing. I called and spoke with [place] and discussed the recommendations of the result note. She informed me that they would take care of the prescription.  Patients POA takes care of patients appointments and informed me that she will need to reschedule the Echo on the 23rd. I sent a secure chat to scheduling requesting them to call and reschedule the Echo as well as schedule the appointment with Dr. Quentin Ore.

## 2021-03-10 NOTE — Telephone Encounter (Signed)
-----   Message from Kate Sable, MD sent at 03/09/2021  5:31 PM EST ----- Frequent PVCs, slow VT and paroxysmal SVTs noted.  Stop losartan, start Toprol-XL 25 mg daily.  Obtain echocardiogram as scheduled.  Refer patient to EP/Dr. Quentin Ore for additional input.

## 2021-03-16 ENCOUNTER — Other Ambulatory Visit: Payer: Self-pay

## 2021-03-16 ENCOUNTER — Non-Acute Institutional Stay: Payer: Medicare Other | Admitting: Primary Care

## 2021-03-16 DIAGNOSIS — G35 Multiple sclerosis: Secondary | ICD-10-CM

## 2021-03-16 DIAGNOSIS — Z515 Encounter for palliative care: Secondary | ICD-10-CM

## 2021-03-16 DIAGNOSIS — R531 Weakness: Secondary | ICD-10-CM

## 2021-03-16 NOTE — Progress Notes (Signed)
[facility] Pall[facility] Consult Note Telephone: [phone]  Fax: [phone]    Date of encounter: 03/16/21 12:02 PM PATIENT NAME: Philip Gordon [place] [id number]   [phone] (home)  DOB: 24-Apr-1950 MRN: [id number] PRIMARY CARE PROVIDER:    Rica Koyanagi, MD,  [place] [address] [phone]  REFERRING PROVIDER:   Rica Koyanagi, MD [address] [place],  [place] [address] [phone]  RESPONSIBLE PARTY:    Contact Information     Name Relation Home Work [place] Other [phone]     Genia Del [phone]     Denard,Lindsay Daughter [phone]        I met [facility] to [facility] in [facility]. Palliative Care was asked to follow this patient by consultation request of  Rica Koyanagi, MD to address advance care planning and complex medical decision making. This is a follow up visit.                                   ASSESSMENT AND PLAN / RECOMMENDATIONS:   Advance Care Planning/Goals of Care: Goals include to maximize quality of life and symptom management. Our advance care planning conversation included a discussion about:    Exploration of personal, cultural or spiritual beliefs that might influence medical decisions  CODE STATUS: DNR  Symptom Management/Plan:   I met with patient in his nursing home room. His voice is getting much weaker and more difficult to understand. He also usually has a large plug of chewing tobacco in his mouth. He does endorse having been diagnosed with Covid this week but feels he is better now. States that he did not feel bad from it.    Endorses fair appetite but doesn't often like all the meals. He has significant snack stores available.   He endorses pain in his right shoulder due to spasms associated with his MS. He has a Lidoderm patch  currently and may benefit from a change in  anti-spasmodic medications from flexeril to  baclofen. We discuss the disease trajectory and what he can expect  in regard to symptoms. Continues to be falls risk due to weakness.  Follow up Palliative Care Visit: Palliative care will continue to follow for complex medical decision making, advance care planning, and clarification of goals. Return 12 weeks or prn.  I spent 25 minutes providing this consultation. More than 50% of the time in this consultation was spent in counseling and care coordination.  PPS: 40%  HOSPICE ELIGIBILITY/DIAGNOSIS: TBD  Chief Complaint: spasms and pain in back.  HISTORY OF PRESENT ILLNESS:  Philip Gordon is a 70 y.o. year old male  with MS, frequent falls, chronic pain, immobility .   History obtained from review of EMR, discussion with primary team, and interview with family, facility staff/caregiver and/or Mr. Reineck.  I reviewed available labs, medications, imaging, studies and related documents from the EMR.  Records reviewed and summarized above.   ROS   General: NAD EYES: denies vision changes ENMT: denies dysphagia Cardiovascular: denies chest pain, denies DOE Pulmonary: denies cough, denies increased SOB Abdomen: endorses good appetite, denies constipation, endorses continence of bowel GU: denies dysuria, endorses continence of urine MSK:  endorses weakness,  no recent  falls reported Skin: denies rashes or wounds Neurological: denies pain in feet, endorses  in back, denies insomnia Psych: Endorses positive mood Heme/lymph/immuno:  denies bruises, abnormal bleeding  Physical Exam: Current and past weights: 151-153 lbs stable Constitutional: NAD General: frail appearing, thin EYES: anicteric sclera, lids intact, no discharge  ENMT: intact hearing, oral mucous membranes moist, dentition intact, voice is weaker and progressively so. CV: no LE edema Pulmonary: no increased work of breathing, no cough, room air Abdomen: intake 75%, no ascites GU: deferred MSK: + sarcopenia,  moves all extremities,  with walker ambulatory Skin: warm and dry, no rashes or wounds on visible skin Neuro:  + generalized weakness,  mild  cognitive impairment Psych: non-anxious affect, A and O x 2-3 , forgetful (cannot remember podiatry appt 2 weeks ago) Hem/lymph/immuno: no widespread bruising   Thank you for the opportunity to participate in the care of Mr. Plante.  The palliative care team will continue to follow. Please call our office at [phone] if we can be of additional assistance.   Jason Coop, NP DNP, AGPCNP-BC  COVID-19 PATIENT SCREENING TOOL Asked and + response:   Have you had symptoms of covid, tested positive or been in contact with someone with symptoms/positive test in the past 5-10 days?

## 2021-03-17 ENCOUNTER — Other Ambulatory Visit: Payer: BLUE CROSS/BLUE SHIELD

## 2021-04-07 ENCOUNTER — Ambulatory Visit: Payer: BLUE CROSS/BLUE SHIELD | Admitting: Cardiology

## 2021-04-14 ENCOUNTER — Other Ambulatory Visit: Payer: Self-pay

## 2021-04-14 ENCOUNTER — Ambulatory Visit (INDEPENDENT_AMBULATORY_CARE_PROVIDER_SITE_OTHER): Payer: Medicare Other

## 2021-04-14 DIAGNOSIS — I493 Ventricular premature depolarization: Secondary | ICD-10-CM

## 2021-04-14 DIAGNOSIS — R001 Bradycardia, unspecified: Secondary | ICD-10-CM

## 2021-04-16 LAB — ECHOCARDIOGRAM COMPLETE
AR max vel: 2.29 cm2
AV Area VTI: 2.31 cm2
AV Area mean vel: 2.4 cm2
AV Mean grad: 3 mmHg
AV Peak grad: 4.8 mmHg
Ao pk vel: 1.1 m/s
Area-P 1/2: 3.11 cm2
S' Lateral: 3.1 cm
Single Plane A4C EF: 60.7 %

## 2021-04-24 ENCOUNTER — Encounter: Payer: Self-pay | Admitting: Cardiology

## 2021-04-24 ENCOUNTER — Other Ambulatory Visit: Payer: Self-pay

## 2021-04-24 ENCOUNTER — Ambulatory Visit (INDEPENDENT_AMBULATORY_CARE_PROVIDER_SITE_OTHER): Payer: Medicare Other | Admitting: Cardiology

## 2021-04-24 VITALS — BP 136/72 | HR 56 | Ht 69.0 in | Wt 156.0 lb

## 2021-04-24 DIAGNOSIS — I472 Ventricular tachycardia, unspecified: Secondary | ICD-10-CM | POA: Diagnosis not present

## 2021-04-24 DIAGNOSIS — I471 Supraventricular tachycardia: Secondary | ICD-10-CM

## 2021-04-24 DIAGNOSIS — I10 Essential (primary) hypertension: Secondary | ICD-10-CM | POA: Diagnosis not present

## 2021-04-24 NOTE — Progress Notes (Signed)
Cardiology Office Note:    Date:  04/24/2021   ID:  Philip Gordon, DOB 1950/06/11, MRN [id number]  PCP:  Rica Koyanagi, MD   [place] [facility] Cardiologist:  Kate Sable, MD     Referring MD: Rica Koyanagi, MD   No chief complaint on file.   History of Present Illness:    Philip Gordon is a 71 y.o. male with a hx of hypertension, MS who presents for follow-up.  Previously seen due to bradycardia.    Cardiac monitor was placed to evaluate any high degree AV block patient lives in a skilled nursing facility.  Recent pulse checks revealed low heart rates in the 30s.  He denies palpitations, dizziness, chest pain or shortness of breath.  He ambulates with a walker, has a history of falls.    Follows up with hematology for MS.  Presents today with the wife of his cousin who is his POA.    Cardiac monitor 02/2021 showed frequent PVCs, VT lasting 33 seconds, paroxysmal SVTs. Echocardiogram obtained 03/2021 showed EF 55 to 60%, mild MR.  Previous EKG in the office showed PVCs.  Toprol-XL was started, losartan stopped.  Past Medical History:  Diagnosis Date   Depression    Hypercholesterolemia    Hypertension    Multiple sclerosis (HCC)    Sees Dr Ala Bent [facility])   Prostate cancer [facility])    Followed by Dr [name] and Dr [name]    Past Surgical History:  Procedure Laterality Date   KNEE SURGERY      Current Medications: No outpatient medications have been marked as taking for the 04/24/21 encounter (Office Visit) with Kate Sable, MD.     Allergies:   Gabapentin and Pregabalin   Social History   Socioeconomic History   Marital status: Divorced    Spouse name: Not on file   Number of children: Not on file   Years of education: Not on file   Highest education level: Not on file  Occupational History   Not on file  Tobacco Use   Smoking status: Never   Smokeless tobacco: Current    Types: Chew   Tobacco comments:    Is going to try  and cut down on the amount.   Vaping Use   Vaping Use: Never used  Substance and Sexual Activity   Alcohol use: No    Alcohol/week: 2.0 standard drinks    Types: 2 Cans of beer per week   Drug use: No   Sexual activity: Not Currently  Other Topics Concern   Not on file  Social History Narrative   Not on file   Social Determinants of Health   Financial Resource Strain: Not on file  Food Insecurity: Not on file  Transportation Needs: Not on file  Physical Activity: Not on file  Stress: Not on file  Social Connections: Not on file     Family History: The patient's family history includes Diabetes in his maternal grandfather and maternal grandmother; Hypertension in his mother; Leukemia in his sister; Stroke in his father.  ROS:   Please see the history of present illness.     All other systems reviewed and are negative.  EKGs/Labs/Other Studies Reviewed:    The following studies were reviewed today:   EKG:  EKG is  ordered today.  The ekg ordered today demonstrates sinus bradycardia.  Recent Labs: 11/08/2020: ALT 13; BUN 17; Creatinine, Ser 1.14; Hemoglobin 10.4; Platelets 398; Potassium 3.9; Sodium 138  Recent Lipid Panel  Component Value Date/Time   CHOL 145 03/31/2018 1429   TRIG 130.0 03/31/2018 1429   HDL 44.70 03/31/2018 1429   CHOLHDL 3 03/31/2018 1429   VLDL 26.0 03/31/2018 1429   LDLCALC 74 03/31/2018 1429   LDLDIRECT 129.6 01/30/2013 0946     Risk Assessment/Calculations:          Physical Exam:    VS:  BP 136/72    Pulse (!) 56    Ht 5\' 9"  (1.753 m)    Wt 156 lb (70.8 kg)    SpO2 99%    BMI 23.04 kg/m     Wt Readings from Last 3 Encounters:  04/24/21 156 lb (70.8 kg)  02/10/21 154 lb (69.9 kg)  11/08/20 153 lb (69.4 kg)     GEN:  Well nourished, soft-spoken HEENT: Normal NECK: No JVD; No carotid bruits LYMPHATICS: No lymphadenopathy CARDIAC: No murmurs, occasional skipped heartbeats, RESPIRATORY:  Clear to auscultation without rales,  wheezing or rhonchi  ABDOMEN: Soft, non-tender, non-distended MUSCULOSKELETAL:  No edema; No deformity  SKIN: Warm and dry NEUROLOGIC:  Alert and oriented x 3 PSYCHIATRIC:  Normal affect   ASSESSMENT:    1. PSVT (paroxysmal supraventricular tachycardia) ([place])   2. VT (ventricular tachycardia)   3. Primary hypertension     PLAN:    In order of problems listed above:  Paroxysmal SVT, 1 episode of sustained VT lasting 33 seconds noted.  Frequent PVCs 45% burden noted.  Start Toprol-XL 25 mg daily, echo with preserved EF 55 to 60%.  Schedule appointment with EP for additional input. Hypertension, BP controlled continue Toprol-XL 25 mg daily.  Follow-up in 6 months.      Medication Adjustments/Labs and Tests Ordered: Current medicines are reviewed at length with the patient today.  Concerns regarding medicines are outlined above.  Orders Placed This Encounter  Procedures   EKG 12-Lead   No orders of the defined types were placed in this encounter.    Patient Instructions  Medication Instructions:  Your physician recommends that you continue on your current medications as directed. Please refer to the Current Medication list given to you today.  *If you need a refill on your cardiac medications before your next appointment, please call your pharmacy*   Lab Work: None ordered  If you have labs (blood work) drawn today and your tests are completely normal, you will receive your results only by: [place] (if you have MyChart) OR A paper copy in the mail If you have any lab test that is abnormal or we need to change your treatment, we will call you to review the results.   Testing/Procedures: None ordered  Follow-Up: At [facility], you and your health needs are our priority.  As part of our continuing mission to provide you with exceptional heart care, we have created designated Provider Care Teams.  These Care Teams include your primary Cardiologist  (physician) and Advanced Practice Providers (APPs -  Physician Assistants and Nurse Practitioners) who all work together to provide you with the care you need, when you need it.  We recommend signing up for the patient portal called "MyChart".  Sign up information is provided on this After Visit Summary.  MyChart is used to connect with patients for Virtual Visits (Telemedicine).  Patients are able to view lab/test results, encounter notes, upcoming appointments, etc.  Non-urgent messages can be sent to your provider as well.   To learn more about what you can do with MyChart, go to [email or web address].  Your next appointment:   6 month(s)  The format for your next appointment:   In Person  Provider:   Kate Sable, MD    Other Instructions Please keep scheduled appointment with Dr. Quentin Ore on May 03, 2021.    Signed, Kate Sable, MD  04/24/2021 5:18 PM    [place]

## 2021-04-24 NOTE — Patient Instructions (Addendum)
Medication Instructions:  Your physician recommends that you continue on your current medications as directed. Please refer to the Current Medication list given to you today.  *If you need a refill on your cardiac medications before your next appointment, please call your pharmacy*   Lab Work: None ordered  If you have labs (blood work) drawn today and your tests are completely normal, you will receive your results only by: [place] (if you have MyChart) OR A paper copy in the mail If you have any lab test that is abnormal or we need to change your treatment, we will call you to review the results.   Testing/Procedures: None ordered  Follow-Up: At [facility], you and your health needs are our priority.  As part of our continuing mission to provide you with exceptional heart care, we have created designated Provider Care Teams.  These Care Teams include your primary Cardiologist (physician) and Advanced Practice Providers (APPs -  Physician Assistants and Nurse Practitioners) who all work together to provide you with the care you need, when you need it.  We recommend signing up for the patient portal called "MyChart".  Sign up information is provided on this After Visit Summary.  MyChart is used to connect with patients for Virtual Visits (Telemedicine).  Patients are able to view lab/test results, encounter notes, upcoming appointments, etc.  Non-urgent messages can be sent to your provider as well.   To learn more about what you can do with MyChart, go to [email or web address].    Your next appointment:   6 month(s)  The format for your next appointment:   In Person  Provider:   Kate Sable, MD    Other Instructions Please keep scheduled appointment with Dr. Quentin Ore on May 03, 2021.

## 2021-05-03 ENCOUNTER — Ambulatory Visit (INDEPENDENT_AMBULATORY_CARE_PROVIDER_SITE_OTHER): Payer: Medicare Other | Admitting: Cardiology

## 2021-05-03 ENCOUNTER — Encounter: Payer: Self-pay | Admitting: Cardiology

## 2021-05-03 ENCOUNTER — Other Ambulatory Visit: Payer: Self-pay

## 2021-05-03 VITALS — BP 102/52 | HR 84 | Ht 69.0 in | Wt 156.0 lb

## 2021-05-03 DIAGNOSIS — I493 Ventricular premature depolarization: Secondary | ICD-10-CM | POA: Diagnosis not present

## 2021-05-03 DIAGNOSIS — I472 Ventricular tachycardia, unspecified: Secondary | ICD-10-CM

## 2021-05-03 NOTE — Patient Instructions (Signed)
Medications: Your physician recommends that you continue on your current medications as directed. Please refer to the Current Medication list given to you today. *If you need a refill on your cardiac medications before your next appointment, please call your pharmacy*  Lab Work: None. If you have labs (blood work) drawn today and your tests are completely normal, you will receive your results only by: [place] (if you have MyChart) OR A paper copy in the mail If you have any lab test that is abnormal or we need to change your treatment, we will call you to review the results.  Testing/Procedures: None.  Follow-Up: At [facility], you and your health needs are our priority.  As part of our continuing mission to provide you with exceptional heart care, we have created designated Provider Care Teams.  These Care Teams include your primary Cardiologist (physician) and Advanced Practice Providers (APPs -  Physician Assistants and Nurse Practitioners) who all work together to provide you with the care you need, when you need it.  Your physician wants you to follow-up in: As needed  with Lars Mage   We recommend signing up for the patient portal called "MyChart".  Sign up information is provided on this After Visit Summary.  MyChart is used to connect with patients for Virtual Visits (Telemedicine).  Patients are able to view lab/test results, encounter notes, upcoming appointments, etc.  Non-urgent messages can be sent to your provider as well.   To learn more about what you can do with MyChart, go to [email or web address].    Any Other Special Instructions Will Be Listed Below (If Applicable).

## 2021-05-03 NOTE — Progress Notes (Signed)
Electrophysiology Office Note:    Date:  05/03/2021   ID:  Philip Gordon, DOB 11-23-1950, MRN [id number]  PCP:  Rica Koyanagi, MD  [facility] Cardiologist:  Kate Sable, MD  [facility] Electrophysiologist:  None   Referring MD: Kate Sable, MD   Chief Complaint: NSVT  History of Present Illness:    Philip Gordon is a 71 y.o. male who presents for an evaluation of NSVT at the request of Dr. Garen Gordon. Their medical history includes hypertension, multiple sclerosis, prostate cancer, hyperlipidemia and depression.  The patient last all Dr. Garen Gordon April 24, 2021.  The patient has previously worn a ZIO monitor.  He is a nursing facility.  He is with his cousin in law today who helps him with his medical care.  He cannot feel any of the PVCs.     Past Medical History:  Diagnosis Date   Depression    Hypercholesterolemia    Hypertension    Multiple sclerosis (HCC)    Sees Dr Ala Bent [facility])   Prostate cancer [facility])    Followed by Dr [name] and Dr [name]    Past Surgical History:  Procedure Laterality Date   KNEE SURGERY      Current Medications: Current Meds  Medication Sig   acetaminophen (TYLENOL) 325 MG tablet Take by mouth every 6 (six) hours as needed. As needed for pain   acidophilus (RISAQUAD) CAPS capsule Take 1 capsule by mouth daily.   Cetirizine HCl (ZYRTEC ALLERGY) 10 MG CAPS Take by mouth.   Cholecalciferol 250 MCG (10000 UT) TABS Take 1 tablet by mouth daily.   ezetimibe (ZETIA) 10 MG tablet Take 1 tablet (10 mg total) by mouth daily.   ferrous sulfate 325 (65 FE) MG tablet Take 325 mg by mouth daily with breakfast.   finasteride (PROSCAR) 5 MG tablet Take 1 tablet (5 mg total) by mouth daily.   fluticasone (FLONASE) 50 MCG/ACT nasal spray Place 2 sprays into both nostrils daily.   lidocaine (LIDODERM) 5 % Place 1 patch onto the skin daily. Remove & Discard patch within 12 hours or as directed by MD   melatonin 3 MG  TABS tablet Take 3 mg by mouth at bedtime.   metoprolol succinate (TOPROL XL) 25 MG 24 hr tablet Take 1 tablet (25 mg total) by mouth daily.   mirtazapine (REMERON) 7.5 MG tablet Take 7.5 mg by mouth at bedtime.   Multiple Vitamin (MULTIVITAMIN) capsule Take 1 capsule by mouth daily.   Polyethyl Glycol-Propyl Glycol (SYSTANE) 0.4-0.3 % SOLN Apply 1 drop to eye in the morning and at bedtime. In both eyes (OU) as needed for dry eyes.   sertraline (ZOLOFT) 25 MG tablet Take 1 tablet (25 mg total) by mouth daily.   tiZANidine (ZANAFLEX) 2 MG tablet Take 2 mg by mouth every 8 (eight) hours as needed for muscle spasms.   traMADol (ULTRAM) 50 MG tablet Take by mouth every 6 (six) hours as needed.   vitamin B-12 (CYANOCOBALAMIN) 1000 MCG tablet Take 1 tablet (1,000 mcg total) by mouth daily.     Allergies:   Gabapentin and Pregabalin   Social History   Socioeconomic History   Marital status: Divorced    Spouse name: Not on file   Number of children: Not on file   Years of education: Not on file   Highest education level: Not on file  Occupational History   Not on file  Tobacco Use   Smoking status: Never   Smokeless tobacco:  Current    Types: Chew   Tobacco comments:    Is going to try and cut down on the amount.   Vaping Use   Vaping Use: Never used  Substance and Sexual Activity   Alcohol use: No    Alcohol/week: 2.0 standard drinks    Types: 2 Cans of beer per week   Drug use: No   Sexual activity: Not Currently  Other Topics Concern   Not on file  Social History Narrative   Not on file   Social Determinants of Health   Financial Resource Strain: Not on file  Food Insecurity: Not on file  Transportation Needs: Not on file  Physical Activity: Not on file  Stress: Not on file  Social Connections: Not on file     Family History: The patient's family history includes Diabetes in his maternal grandfather and maternal grandmother; Hypertension in his mother; Leukemia in his  sister; Stroke in his father.  ROS:   Please see the history of present illness.    All other systems reviewed and are negative.  EKGs/Labs/Other Studies Reviewed:    The following studies were reviewed today:  April 14, 2021 echo Left ventricular function normal, 35% Right ventricular function normal Mild MR  March 09, 2021 ZIO monitor personally reviewed Frequent ventricular ectopy, 45% 293 NSVT, longest 33 seconds with an average rate of 117 bpm  March 16, 2019 EKG  Frequent PVCs, inferior axis, precordial transition in V3   EKG:  The ekg ordered today demonstrates sinus rhythm with frequent PVCs.  PVCs are monomorphic and are positive throughout the precordium and have an inferior axis.  This looks like a different PVC than prior EKGs   Recent Labs: 11/08/2020: ALT 13; BUN 17; Creatinine, Ser 1.14; Hemoglobin 10.4; Platelets 398; Potassium 3.9; Sodium 138  Recent Lipid Panel    Component Value Date/Time   CHOL 145 03/31/2018 1429   TRIG 130.0 03/31/2018 1429   HDL 44.70 03/31/2018 1429   CHOLHDL 3 03/31/2018 1429   VLDL 26.0 03/31/2018 1429   LDLCALC 74 03/31/2018 1429   LDLDIRECT 129.6 01/30/2013 0946    Physical Exam:    VS:  BP (!) 102/52 (BP Location: Right Arm, Patient Position: Sitting, Cuff Size: Normal)    Pulse 84    Ht 5\' 9"  (1.753 m)    Wt 156 lb (70.8 kg) Comment: per patient   SpO2 100%    BMI 23.04 kg/m     Wt Readings from Last 3 Encounters:  05/03/21 156 lb (70.8 kg)  04/24/21 156 lb (70.8 kg)  02/10/21 154 lb (69.9 kg)     GEN:  Well nourished, well developed in no acute distress elderly.  In wheelchair. HEENT: Normal NECK: No JVD; No carotid bruits LYMPHATICS: No lymphadenopathy CARDIAC: Irregular rhythm, no murmurs, rubs, gallops RESPIRATORY:  Clear to auscultation without rales, wheezing or rhonchi  ABDOMEN: Soft, non-tender, non-distended MUSCULOSKELETAL:  No edema; No deformity  SKIN: Warm and dry NEUROLOGIC:  Alert and  oriented x 3 PSYCHIATRIC:  Normal affect       ASSESSMENT:    1. Frequent PVCs   2. VT (ventricular tachycardia)    PLAN:    In order of problems listed above:  #Frequent PVCs Pleomorphic.  No LV dysfunction.  Asymptomatic.  Given medical comorbidities, I do not recommend aggressive medical suppression of the PVCs.  The patient and his family were in agreement with this plan.  Follow-up as needed with EP.  Continue routine primary  care follow-up.   Medication Adjustments/Labs and Tests Ordered: Current medicines are reviewed at length with the patient today.  Concerns regarding medicines are outlined above.  Orders Placed This Encounter  Procedures   EKG 12-Lead   No orders of the defined types were placed in this encounter.    Signed, Hilton Cork. Quentin Ore, MD, [facility], [facility] 05/03/2021 9:57 AM    Electrophysiology [facility]

## 2021-07-07 ENCOUNTER — Non-Acute Institutional Stay: Payer: Medicare Other | Admitting: Primary Care

## 2021-07-07 DIAGNOSIS — G35 Multiple sclerosis: Secondary | ICD-10-CM

## 2021-07-07 DIAGNOSIS — G8929 Other chronic pain: Secondary | ICD-10-CM

## 2021-07-07 DIAGNOSIS — R531 Weakness: Secondary | ICD-10-CM

## 2021-07-07 DIAGNOSIS — Z515 Encounter for palliative care: Secondary | ICD-10-CM

## 2021-07-07 NOTE — Progress Notes (Signed)
? ? ?Manufacturing engineer ?Community Palliative Care Consult Note ?Telephone: [phone]  ?Fax: [phone]  ? ? ?Date of encounter: 07/07/21 ?1:40 PM ?PATIENT NAME: Philip Gordon ?[place] [address]   ?[phone] (home)  ?DOB: [birth year] ?MRN: [id number] ?PRIMARY CARE PROVIDER:    ?Rica Koyanagi, MD,  ?[place] [address] ?[phone] ? ?REFERRING PROVIDER:   ?Rica Koyanagi, MD ?[place],  [place] [address] ?[phone] ? ?RESPONSIBLE PARTY:    ?Contact Information   ? ? Name Relation Home Work Mobile  ? Mackenize Delgadillo,[name] Other [phone]    ? Amritha Yorke,Shirley Friend [phone]    ? Denard,Lindsay Daughter [phone]    ? ?  ? ? ?I met face to face with patient in [facility]. Palliative Care was asked to follow this patient by consultation request of  Rica Koyanagi, MD to address advance care planning and complex medical decision making. This is a follow up visit. ? ?                                 ASSESSMENT AND PLAN / RECOMMENDATIONS:  ? ?Advance Care Planning/Goals of Care: Goals include to maximize quality of life and symptom management. Patient/health care surrogate gave his/her permission to discuss.Our advance care planning conversation included a discussion about:    ? ?Exploration of personal, cultural or spiritual beliefs that might influence medical decisions ?CODE STATUS: DNR, comfort measures ? ?Symptom Management/Plan: ? ?Pain:  I met with patient in his nursing home room. He endorses no changes. He says his back pain is the same; it is constant. He states that massage does help and I would recommend that PT can put him on a regular schedule of deep ultrasound massage.   ? ?He states he  does not tolerate gabapentin well in the past.  He is on daily tizanidine. He may benefit from baclofen qid or tid in smaller doses. ? ?Nutrition. Labs reviewed. Albumin WNL, implying intake is good. Weight is stable ? ?Disease progression: On our exam today  I do see he's had some ocular changes with his right orb more protruding. ? ?Follow up Palliative Care Visit: Palliative care will continue to follow for complex medical decision making, advance care planning, and clarification of goals. Return 12 weeks or prn. ? ?This visit was coded based on medical decision making (MDM). ? ?PPS: 50% ? ?HOSPICE ELIGIBILITY/DIAGNOSIS: TBD ? ?Chief Complaint: immobility, pain in back ? ?HISTORY OF PRESENT ILLNESS:  Philip Gordon is a 71 y.o. year old male  with MS, immobility, debility and chronic pain . Patient seen today to review palliative care needs to include medical decision making and advance care planning as appropriate.  ? ?History obtained from review of EMR, discussion with primary team, and interview with family, facility staff/caregiver and/or Mr. Dolezal.  ?I reviewed available labs, medications, imaging, studies and related documents from the EMR.  Records reviewed and summarized above.  ? ?ROS ? ?General: NAD ?EYES: denies vision changes ?ENMT: denies dysphagia ?Cardiovascular: denies chest pain, denies DOE ?Pulmonary: denies cough, denies increased SOB ?Abdomen: endorses fair appetite, denies constipation, endorses continence of bowel ?GU: denies dysuria, endorses continence of urine ?MSK:  denies  increased weakness,  no falls reported ?Skin: denies rashes or wounds ?Neurological: denies pain, denies insomnia ?Psych: Endorses positive mood ? ?Physical Exam: ?Current and past weights: Stable 153-155 lbs ?Constitutional: NAD ?General: frail appearing, thin ?  EYES: anicteric sclera,  L lid intact,  R eye  orb with protrusion, no discharge  ?ENMT: intact hearing, oral mucous membranes moist, dentition intact ?CV:  no LE edema ?Pulmonary: no increased work of breathing, no cough, room air ?Abdomen: intake 50%,  no ascites ?MSK: mild  sarcopenia, moves all extremities, ambulatory with walker ?Skin: warm and dry, no rashes or wounds on visible skin ?Neuro:  no new   generalized weakness,  mild cognitive impairment, non-anxious affect ? ?Outpatient Encounter Medications as of 07/07/2021  ?Medication Sig  ? Cetirizine HCl (ZYRTEC ALLERGY) 10 MG CAPS Take by mouth.  ? finasteride (PROSCAR) 5 MG tablet Take 1 tablet (5 mg total) by mouth daily.  ? fluticasone (FLONASE) 50 MCG/ACT nasal spray Place 2 sprays into both nostrils daily.  ? lidocaine (LIDODERM) 5 % Place 1 patch onto the skin daily. Remove & Discard patch within 12 hours or as directed by MD  ? melatonin 5 MG TABS Take 5 mg by mouth at bedtime.  ? metoprolol succinate (TOPROL XL) 25 MG 24 hr tablet Take 1 tablet (25 mg total) by mouth daily. (Patient taking differently: Take 12.5 mg by mouth daily.)  ? mirtazapine (REMERON) 7.5 MG tablet Take 7.5 mg by mouth at bedtime.  ? Multiple Vitamin (MULTIVITAMIN) capsule Take 1 capsule by mouth daily.  ? Polyethyl Glycol-Propyl Glycol (SYSTANE) 0.4-0.3 % SOLN Apply 1 drop to eye in the morning and at bedtime. In both eyes (OU) as needed for dry eyes.  ? sertraline (ZOLOFT) 25 MG tablet Take 1 tablet (25 mg total) by mouth daily.  ? tiZANidine (ZANAFLEX) 2 MG tablet Take 2 mg by mouth at bedtime.  ? vitamin B-12 (CYANOCOBALAMIN) 1000 MCG tablet Take 1 tablet (1,000 mcg total) by mouth daily.  ? acetaminophen (TYLENOL) 325 MG tablet Take by mouth every 6 (six) hours as needed. As needed for pain (Patient not taking: Reported on 07/07/2021)  ? acidophilus (RISAQUAD) CAPS capsule Take 1 capsule by mouth daily. (Patient not taking: Reported on 07/07/2021)  ? Cholecalciferol 250 MCG (10000 UT) TABS Take 1 tablet by mouth daily. (Patient not taking: Reported on 07/07/2021)  ? ezetimibe (ZETIA) 10 MG tablet Take 1 tablet (10 mg total) by mouth daily. (Patient not taking: Reported on 07/07/2021)  ? ferrous sulfate 325 (65 FE) MG tablet Take 325 mg by mouth daily with breakfast. (Patient not taking: Reported on 07/07/2021)  ? traMADol (ULTRAM) 50 MG tablet Take by mouth every 6 (six) hours as  needed. (Patient not taking: Reported on 07/07/2021)  ? ?No facility-administered encounter medications on file as of 07/07/2021.  ? ? ? ?Thank you for the opportunity to participate in the care of Mr. Corella.  The palliative care team will continue to follow. Please call our office at [phone] if we can be of additional assistance.  ? ?Jason Coop, NP DNP, AGPCNP-BC ? ?COVID-19 PATIENT SCREENING TOOL ?Asked and negative response unless otherwise noted:  ? ?Have you had symptoms of covid, tested positive or been in contact with someone with symptoms/positive test in the past 5-10 days?  ? ?

## 2021-10-25 ENCOUNTER — Emergency Department: Payer: Medicare Other

## 2021-10-25 ENCOUNTER — Encounter: Payer: Self-pay | Admitting: Internal Medicine

## 2021-10-25 ENCOUNTER — Observation Stay
Admission: EM | Admit: 2021-10-25 | Discharge: 2021-10-27 | Disposition: A | Discharge status: Skilled Nursing Facility | Payer: Medicare Other | Attending: Family Medicine | Admitting: Family Medicine

## 2021-10-25 ENCOUNTER — Telehealth: Payer: Self-pay | Admitting: Cardiology

## 2021-10-25 ENCOUNTER — Other Ambulatory Visit: Payer: Self-pay

## 2021-10-25 DIAGNOSIS — F32A Depression, unspecified: Secondary | ICD-10-CM | POA: Diagnosis present

## 2021-10-25 DIAGNOSIS — Z8546 Personal history of malignant neoplasm of prostate: Secondary | ICD-10-CM | POA: Diagnosis not present

## 2021-10-25 DIAGNOSIS — E875 Hyperkalemia: Secondary | ICD-10-CM | POA: Diagnosis not present

## 2021-10-25 DIAGNOSIS — R531 Weakness: Secondary | ICD-10-CM | POA: Diagnosis present

## 2021-10-25 DIAGNOSIS — E860 Dehydration: Secondary | ICD-10-CM | POA: Diagnosis not present

## 2021-10-25 DIAGNOSIS — I10 Essential (primary) hypertension: Secondary | ICD-10-CM | POA: Insufficient documentation

## 2021-10-25 DIAGNOSIS — R001 Bradycardia, unspecified: Secondary | ICD-10-CM | POA: Diagnosis not present

## 2021-10-25 DIAGNOSIS — N179 Acute kidney failure, unspecified: Secondary | ICD-10-CM | POA: Diagnosis not present

## 2021-10-25 DIAGNOSIS — R600 Localized edema: Secondary | ICD-10-CM | POA: Insufficient documentation

## 2021-10-25 DIAGNOSIS — R9431 Abnormal electrocardiogram [ECG] [EKG]: Secondary | ICD-10-CM

## 2021-10-25 DIAGNOSIS — I498 Other specified cardiac arrhythmias: Secondary | ICD-10-CM | POA: Diagnosis not present

## 2021-10-25 DIAGNOSIS — I4891 Unspecified atrial fibrillation: Secondary | ICD-10-CM | POA: Insufficient documentation

## 2021-10-25 DIAGNOSIS — E162 Hypoglycemia, unspecified: Secondary | ICD-10-CM | POA: Insufficient documentation

## 2021-10-25 DIAGNOSIS — Z79899 Other long term (current) drug therapy: Secondary | ICD-10-CM | POA: Insufficient documentation

## 2021-10-25 DIAGNOSIS — Z20822 Contact with and (suspected) exposure to covid-19: Secondary | ICD-10-CM | POA: Diagnosis not present

## 2021-10-25 LAB — TROPONIN I (HIGH SENSITIVITY)
Troponin I (High Sensitivity): 3 ng/L (ref <18)
Troponin I (High Sensitivity): 4 ng/L (ref <18)

## 2021-10-25 LAB — COMPREHENSIVE METABOLIC PANEL
ALT: 14 U/L (ref 0–44)
ALT: 11 U/L (ref 0–44)
AST: 23 U/L (ref 15–41)
AST: 20 U/L (ref 15–41)
Albumin: 3.9 g/dL (ref 3.5–5.0)
Albumin: 2.9 g/dL — ABNORMAL LOW (ref 3.5–5.0)
Alkaline Phosphatase: 95 U/L (ref 38–126)
Alkaline Phosphatase: 76 U/L (ref 38–126)
Anion gap: 6 (ref 5–15)
Anion gap: 6 (ref 5–15)
BUN: 20 mg/dL (ref 8–23)
BUN: 16 mg/dL (ref 8–23)
CO2: 22 mmol/L (ref 22–32)
CO2: 22 mmol/L (ref 22–32)
Calcium: 9.1 mg/dL (ref 8.9–10.3)
Calcium: 8.2 mg/dL — ABNORMAL LOW (ref 8.9–10.3)
Chloride: 109 mmol/L (ref 98–111)
Chloride: 109 mmol/L (ref 98–111)
Creatinine, Ser: 1.31 mg/dL — ABNORMAL HIGH (ref 0.61–1.24)
Creatinine, Ser: 1.28 mg/dL — ABNORMAL HIGH (ref 0.61–1.24)
GFR, Estimated: 57 mL/min — ABNORMAL LOW (ref >60)
GFR, Estimated: 59 mL/min — ABNORMAL LOW (ref >60)
Glucose, Bld: 83 mg/dL (ref 70–99)
Glucose, Bld: 117 mg/dL — ABNORMAL HIGH (ref 70–99)
Potassium: 6.2 mmol/L — ABNORMAL HIGH (ref 3.5–5.1)
Potassium: 3.1 mmol/L — ABNORMAL LOW (ref 3.5–5.1)
Sodium: 137 mmol/L (ref 135–145)
Sodium: 137 mmol/L (ref 135–145)
Total Bilirubin: 2.6 mg/dL — ABNORMAL HIGH (ref 0.3–1.2)
Total Bilirubin: 1.4 mg/dL — ABNORMAL HIGH (ref 0.3–1.2)
Total Protein: 7.1 g/dL (ref 6.5–8.1)
Total Protein: 5.3 g/dL — ABNORMAL LOW (ref 6.5–8.1)

## 2021-10-25 LAB — BASIC METABOLIC PANEL
Anion gap: 7 (ref 5–15)
BUN: 18 mg/dL (ref 8–23)
CO2: 23 mmol/L (ref 22–32)
Calcium: 8.7 mg/dL — ABNORMAL LOW (ref 8.9–10.3)
Chloride: 109 mmol/L (ref 98–111)
Creatinine, Ser: 1.31 mg/dL — ABNORMAL HIGH (ref 0.61–1.24)
GFR, Estimated: 57 mL/min — ABNORMAL LOW (ref >60)
Glucose, Bld: 91 mg/dL (ref 70–99)
Potassium: 3.5 mmol/L (ref 3.5–5.1)
Sodium: 139 mmol/L (ref 135–145)

## 2021-10-25 LAB — CBC WITH DIFFERENTIAL/PLATELET
Abs Immature Granulocytes: 0.06 10*3/uL (ref 0.00–0.07)
Basophils Absolute: 0.1 10*3/uL (ref 0.0–0.1)
Basophils Relative: 1 %
Eosinophils Absolute: 0.1 10*3/uL (ref 0.0–0.5)
Eosinophils Relative: 1 %
Hemoglobin: 11.8 g/dL — ABNORMAL LOW (ref 13.0–17.0)
Immature Granulocytes: 1 %
Lymphocytes Relative: 20 %
Lymphs Abs: 1.9 10*3/uL (ref 0.7–4.0)
Monocytes Absolute: 1 10*3/uL (ref 0.1–1.0)
Monocytes Relative: 11 %
Neutro Abs: 6.2 10*3/uL (ref 1.7–7.7)
Neutrophils Relative %: 66 %
Platelets: 339 10*3/uL (ref 150–400)
Smear Review: NORMAL
WBC: 9.3 10*3/uL (ref 4.0–10.5)

## 2021-10-25 LAB — GLUCOSE, CAPILLARY
Glucose-Capillary: 114 mg/dL — ABNORMAL HIGH (ref 70–99)
Glucose-Capillary: 166 mg/dL — ABNORMAL HIGH (ref 70–99)
Glucose-Capillary: 55 mg/dL — ABNORMAL LOW (ref 70–99)
Glucose-Capillary: 90 mg/dL (ref 70–99)

## 2021-10-25 LAB — URINALYSIS, COMPLETE (UACMP) WITH MICROSCOPIC
Bacteria, UA: NONE SEEN
Bilirubin Urine: NEGATIVE
Glucose, UA: NEGATIVE mg/dL
Hgb urine dipstick: NEGATIVE
Ketones, ur: NEGATIVE mg/dL
Leukocytes,Ua: NEGATIVE
Nitrite: NEGATIVE
Protein, ur: NEGATIVE mg/dL
Specific Gravity, Urine: 1.019 (ref 1.005–1.030)
Squamous Epithelial / HPF: NONE SEEN (ref 0–5)
pH: 6 (ref 5.0–8.0)

## 2021-10-25 LAB — SARS CORONAVIRUS 2 BY RT PCR: SARS Coronavirus 2 by RT PCR: NEGATIVE

## 2021-10-25 LAB — TSH: TSH: 1.784 u[IU]/mL (ref 0.350–4.500)

## 2021-10-25 LAB — CBG MONITORING, ED: Glucose-Capillary: 39 mg/dL — CL (ref 70–99)

## 2021-10-25 LAB — PROTIME-INR
INR: 1.1 (ref 0.8–1.2)
Prothrombin Time: 14.5 seconds (ref 11.4–15.2)

## 2021-10-25 LAB — MAGNESIUM: Magnesium: 2.1 mg/dL (ref 1.7–2.4)

## 2021-10-25 LAB — BRAIN NATRIURETIC PEPTIDE: B Natriuretic Peptide: 179.9 pg/mL — ABNORMAL HIGH (ref 0.0–100.0)

## 2021-10-25 MED ORDER — ACETAMINOPHEN 650 MG RE SUPP: 650.0000 mg | Freq: Four times a day (QID) | RECTAL | Status: DC | PRN

## 2021-10-25 MED ORDER — DEXTROSE 50 % IV SOLN
INTRAVENOUS | Status: AC
  Administered 2021-10-25: 50 mL via INTRAVENOUS
  Filled 2021-10-25: qty 50

## 2021-10-25 MED ORDER — INSULIN ASPART 100 UNIT/ML IV SOLN
10.0000 [IU] | Freq: Once | INTRAVENOUS | Status: AC
  Administered 2021-10-25: 10 [IU] via INTRAVENOUS
  Filled 2021-10-25: qty 0.1

## 2021-10-25 MED ORDER — ONDANSETRON HCL 4 MG/2ML IJ SOLN: 4.0000 mg | Freq: Four times a day (QID) | INTRAMUSCULAR | Status: DC | PRN

## 2021-10-25 MED ORDER — FINASTERIDE 5 MG PO TABS
5.0000 mg | ORAL_TABLET | Freq: Every day | ORAL | Status: DC
  Administered 2021-10-26 – 2021-10-27 (×2): 5 mg via ORAL
  Filled 2021-10-25 (×2): qty 1

## 2021-10-25 MED ORDER — POLYVINYL ALCOHOL 1.4 % OP SOLN: 1.0000 [drp] | Freq: Four times a day (QID) | OPHTHALMIC | Status: DC | PRN

## 2021-10-25 MED ORDER — SODIUM ZIRCONIUM CYCLOSILICATE 10 G PO PACK
10.0000 g | PACK | ORAL | Status: AC
  Administered 2021-10-25: 10 g via ORAL
  Filled 2021-10-25: qty 1

## 2021-10-25 MED ORDER — LIDOCAINE 5 % EX PTCH
1.0000 | MEDICATED_PATCH | CUTANEOUS | Status: DC
Start: 2021-10-25 — End: 2021-10-27
  Administered 2021-10-26: 1 via TRANSDERMAL
  Filled 2021-10-25 (×3): qty 1

## 2021-10-25 MED ORDER — METOPROLOL SUCCINATE ER 25 MG PO TB24: 12.5000 mg | ORAL_TABLET | Freq: Every day | ORAL | Status: DC

## 2021-10-25 MED ORDER — CALCIUM GLUCONATE-NACL 1-0.675 GM/50ML-% IV SOLN
1.0000 g | Freq: Once | INTRAVENOUS | Status: AC
  Administered 2021-10-25: 1000 mg via INTRAVENOUS
  Filled 2021-10-25: qty 50

## 2021-10-25 MED ORDER — DEXTROSE 10 % IV SOLN: INTRAVENOUS | Status: DC

## 2021-10-25 MED ORDER — DEXTROSE 50 % IV SOLN: 1.0000 | Freq: Once | INTRAVENOUS | Status: DC

## 2021-10-25 MED ORDER — DEXTROSE 50 % IV SOLN
1.0000 | Freq: Once | INTRAVENOUS | Status: AC
  Administered 2021-10-25: 50 mL via INTRAVENOUS
  Filled 2021-10-25: qty 50

## 2021-10-25 MED ORDER — METOPROLOL SUCCINATE ER 25 MG PO TB24
25.0000 mg | ORAL_TABLET | Freq: Every day | ORAL | Status: DC
  Administered 2021-10-25 – 2021-10-26 (×2): 25 mg via ORAL
  Filled 2021-10-25 (×2): qty 1

## 2021-10-25 MED ORDER — ADULT MULTIVITAMIN W/MINERALS CH
1.0000 | ORAL_TABLET | Freq: Every day | ORAL | Status: DC
Start: 2021-10-26 — End: 2021-10-27
  Administered 2021-10-26 – 2021-10-27 (×2): 1 via ORAL
  Filled 2021-10-25 (×2): qty 1

## 2021-10-25 MED ORDER — MELATONIN 5 MG PO TABS
5.0000 mg | ORAL_TABLET | Freq: Every day | ORAL | Status: DC
  Administered 2021-10-25 – 2021-10-26 (×2): 5 mg via ORAL
  Filled 2021-10-25 (×2): qty 1

## 2021-10-25 MED ORDER — TIZANIDINE HCL 2 MG PO TABS
2.0000 mg | ORAL_TABLET | Freq: Every day | ORAL | Status: DC
Start: 2021-10-25 — End: 2021-10-27
  Administered 2021-10-25: 2 mg via ORAL
  Filled 2021-10-25 (×3): qty 1

## 2021-10-25 MED ORDER — ACETAMINOPHEN 325 MG PO TABS
650.0000 mg | ORAL_TABLET | Freq: Four times a day (QID) | ORAL | Status: DC | PRN
  Administered 2021-10-27: 650 mg via ORAL
  Filled 2021-10-25: qty 2

## 2021-10-25 MED ORDER — MIRTAZAPINE 15 MG PO TABS
7.5000 mg | ORAL_TABLET | Freq: Every day | ORAL | Status: DC
  Administered 2021-10-25 – 2021-10-26 (×2): 7.5 mg via ORAL
  Filled 2021-10-25 (×2): qty 1

## 2021-10-25 MED ORDER — DEXTROSE 50 % IV SOLN
INTRAVENOUS | Status: AC
  Administered 2021-10-25: 50 mL
  Filled 2021-10-25: qty 50

## 2021-10-25 MED ORDER — DEXTROSE 50 % IV SOLN: 50.0000 mL | Freq: Once | INTRAVENOUS | Status: AC

## 2021-10-25 MED ORDER — ENOXAPARIN SODIUM 40 MG/0.4ML IJ SOSY
40.0000 mg | PREFILLED_SYRINGE | INTRAMUSCULAR | Status: DC
  Administered 2021-10-25 – 2021-10-26 (×2): 40 mg via SUBCUTANEOUS
  Filled 2021-10-25 (×2): qty 0.4

## 2021-10-25 MED ORDER — LACTATED RINGERS IV BOLUS
1000.0000 mL | Freq: Once | INTRAVENOUS | Status: AC
  Administered 2021-10-25: 1000 mL via INTRAVENOUS

## 2021-10-25 MED ORDER — SERTRALINE HCL 50 MG PO TABS
25.0000 mg | ORAL_TABLET | Freq: Every day | ORAL | Status: DC
  Administered 2021-10-26 – 2021-10-27 (×2): 25 mg via ORAL
  Filled 2021-10-25 (×2): qty 1

## 2021-10-25 MED ORDER — ONDANSETRON HCL 4 MG PO TABS: 4.0000 mg | ORAL_TABLET | Freq: Four times a day (QID) | ORAL | Status: DC | PRN

## 2021-10-25 MED ORDER — VITAMIN B-12 1000 MCG PO TABS
1000.0000 ug | ORAL_TABLET | Freq: Every day | ORAL | Status: DC
  Administered 2021-10-26 – 2021-10-27 (×2): 1000 ug via ORAL
  Filled 2021-10-25 (×2): qty 1

## 2021-10-25 MED ORDER — SODIUM CHLORIDE 0.9 % IV BOLUS
1000.0000 mL | Freq: Once | INTRAVENOUS | Status: AC
  Administered 2021-10-25: 1000 mL via INTRAVENOUS

## 2021-10-25 NOTE — Assessment & Plan Note (Signed)
Stable Continue sertraline and mirtazapine

## 2021-10-25 NOTE — Progress Notes (Signed)
Hypoglycemic Event  CBG: 55  Treatment: D50 50 mL (25 gm)  Symptoms: None  Follow-up CBG: Time:2245 CBG Result:166  Possible Reasons for Event: Inadequate meal intake and Other: see MAR  Comments/MD notified:qh CBG for 4hours    [employer]

## 2021-10-25 NOTE — Assessment & Plan Note (Signed)
Patient presents to the ER for evaluation of weakness and bradycardia and found to have a potassium level of 6.2. TSH is within normal limits. Unclear etiology for patient's hyperkalemia r/o RTA IV Patient received IV calcium gluconate, dextrose, insulin, Lokelma Repeat potassium levels

## 2021-10-25 NOTE — ED Triage Notes (Signed)
Patient from [facility] - sent for uncontrolled A-fib and hypotension; Only complaint from patient is that he feels week

## 2021-10-25 NOTE — Telephone Encounter (Signed)
Caller stated patient has been having low HR between 26 and 28 and she would like to know if he is a candidate for a pacemaker.  Caller also noted they discontinued the patient's metoprolol succinate (TOPROL XL) 25 MG 24 hr tablet medication on Monday.

## 2021-10-25 NOTE — Assessment & Plan Note (Signed)
Most likely multifactorial related to beta-blocker use as well as hyperkalemia. Patient was on metoprolol which has since been discontinued We will treat hyperkalemia Expect improvement in patient's heart rate following resolution of hyperkalemia. We will consult cardiology

## 2021-10-25 NOTE — Assessment & Plan Note (Signed)
Stable

## 2021-10-25 NOTE — Consult Note (Signed)
Cardiology Consultation:   Patient ID: Philip Gordon MRN: [id number]; DOB: May 18, 1950  Admit date: 10/25/2021 Date of Consult: 10/25/2021  PCP:  Rica Koyanagi, MD   [facility] Providers Cardiologist:  Kate Sable, MD        Patient Profile:   Philip Gordon is a 71 y.o. male with a hx of hypertension, MS, PVCs who is being seen 10/25/2021 for the evaluation of bradycardia at the request of [place].  History of Present Illness:   Philip Gordon is a 71 year old male with history of PVCs, VT, hypertension, multiple sclerosis who presents due to weakness and bradycardia.  Patient lives in an assisted living facility, states feeling weak over the past day or so.  Endorses poor oral intake, does not drink much due to staff not providing enough water for him.  Due to weakness, vitals were checked and heart rates were noted to be in the 30s to 40s.  He was advised to come to the emergency room.  Previously known to myself from outpatient, cardiac monitor 02/2021 showed frequent PVCs and VT lasting 33 seconds.  Toprol-XL was started to suppress ectopy.  Recent bradycardia at facility prompted primary care physician to stop metoprolol 2 days ago.  In the ED upon admission, potassium was elevated at 6.2, creatinine 1.31.  EKG showed sinus rhythm with frequent PVCs, bigeminal pattern, heart rate 87.  Started on IV fluids possible dehydration, medications to address hyperkalemia also given   Past Medical History:  Diagnosis Date   Depression    Hypercholesterolemia    Hypertension    Multiple sclerosis (HCC)    Sees Dr Ala Bent [facility])   Prostate cancer [facility])    Followed by Dr [name] and Dr [name]    Past Surgical History:  Procedure Laterality Date   KNEE SURGERY       Home Medications:  Prior to Admission medications   Medication Sig Start Date End Date Taking? Authorizing Provider  cetirizine (ZYRTEC) 10 MG tablet Take 10 mg by mouth daily.   Yes [provider]  fenofibrate 54 MG tablet Take 54 mg by mouth daily. 10/04/21  Yes [provider]  finasteride (PROSCAR) 5 MG tablet Take 1 tablet (5 mg total) by mouth daily. 09/03/19  Yes Stoioff, Ronda Fairly, MD  fluticasone (FLONASE) 50 MCG/ACT nasal spray Place 2 sprays into both nostrils daily. 03/14/17  Yes Einar Pheasant, MD  lidocaine (LIDODERM) 5 % Place 1 patch onto the skin daily. Remove & Discard patch within 12 hours or as directed by MD   Yes [provider]  melatonin 5 MG TABS Take 5 mg by mouth at bedtime.   Yes [provider]  mirtazapine (REMERON) 7.5 MG tablet Take 7.5 mg by mouth at bedtime. 06/08/20  Yes [provider]  Multiple Vitamin (MULTIVITAMIN) capsule Take 1 capsule by mouth daily.   Yes [provider]  mupirocin ointment (BACTROBAN) 2 % Apply 1 Application topically 3 (three) times daily. 10/24/21  Yes [provider]  Polyethyl Glycol-Propyl Glycol (SYSTANE) 0.4-0.3 % SOLN Apply 1 drop to eye in the morning and at bedtime. In both eyes (OU) as needed for dry eyes.   Yes [provider]  sertraline (ZOLOFT) 25 MG tablet Take 1 tablet (25 mg total) by mouth daily. 06/25/19  Yes Einar Pheasant, MD  tiZANidine (ZANAFLEX) 2 MG tablet Take 2 mg by mouth at bedtime.   Yes [provider]  traMADol (ULTRAM) 50 MG tablet Take 50 mg  by mouth every 6 (six) hours as needed for moderate pain.   Yes [provider]  vitamin B-12 (CYANOCOBALAMIN) 1000 MCG tablet Take 1 tablet (1,000 mcg total) by mouth daily. 10/25/15  Yes Einar Pheasant, MD  acetaminophen (TYLENOL) 325 MG tablet Take by mouth every 6 (six) hours as needed. As needed for pain    [provider]  acidophilus (RISAQUAD) CAPS capsule Take 1 capsule by mouth daily. Patient not taking: Reported on 07/07/2021    [provider]  Cholecalciferol 250 MCG (10000 UT) TABS Take 1 tablet by mouth daily. Patient not taking: Reported on  07/07/2021    [provider]  ezetimibe (ZETIA) 10 MG tablet Take 1 tablet (10 mg total) by mouth daily. Patient not taking: Reported on 07/07/2021 10/14/18   Einar Pheasant, MD  ferrous sulfate 325 (65 FE) MG tablet Take 325 mg by mouth daily with breakfast. Patient not taking: Reported on 07/07/2021    [provider]  metoprolol succinate (TOPROL XL) 25 MG 24 hr tablet Take 1 tablet (25 mg total) by mouth daily. Patient taking differently: Take 12.5 mg by mouth daily. 03/10/21   Kate Sable, MD    Inpatient Medications: Scheduled Meds:  cyanocobalamin  1,000 mcg Oral Daily   dextrose  1 ampule Intravenous Once   enoxaparin (LOVENOX) injection  40 mg Subcutaneous Q24H   finasteride  5 mg Oral Daily   insulin aspart  10 Units Intravenous Once   lidocaine  1 patch Transdermal Q24H   melatonin  5 mg Oral QHS   mirtazapine  7.5 mg Oral QHS   multivitamin  1 capsule Oral Daily   sertraline  25 mg Oral Daily   tiZANidine  2 mg Oral QHS   Continuous Infusions:  sodium chloride     PRN Meds: acetaminophen **OR** acetaminophen, ondansetron **OR** ondansetron (ZOFRAN) IV, Polyethyl Glycol-Propyl Glycol  Allergies:    Allergies  Allergen Reactions   Gabapentin     Other reaction(s): Dizziness   Pregabalin     Other reaction(s): Dizziness    Social History:   Social History   Socioeconomic History   Marital status: Divorced    Spouse name: Not on file   Number of children: Not on file   Years of education: Not on file   Highest education level: Not on file  Occupational History   Not on file  Tobacco Use   Smoking status: Never   Smokeless tobacco: Current    Types: Chew   Tobacco comments:    Is going to try and cut down on the amount.   Vaping Use   Vaping Use: Never used  Substance and Sexual Activity   Alcohol use: No    Alcohol/week: 2.0 standard drinks of alcohol    Types: 2 Cans of beer per week   Drug use: No   Sexual activity: Not  Currently  Other Topics Concern   Not on file  Social History Narrative   Not on file   Social Determinants of Health   Financial Resource Strain: Not on file  Food Insecurity: Not on file  Transportation Needs: Not on file  Physical Activity: Not on file  Stress: Not on file  Social Connections: Not on file  Intimate Partner Violence: Not on file    Family History:    Family History  Problem Relation Age of Onset   Hypertension Mother    Stroke Father    Diabetes Maternal Grandmother    Diabetes Maternal  Grandfather    Leukemia Sister        died - age 104     ROS:  Please see the history of present illness.   All other ROS reviewed and negative.     Physical Exam/Data:   Vitals:   10/25/21 1700 10/25/21 1745 10/25/21 1800 10/25/21 1815  BP: (!) 159/63 (!) 145/55 (!) 150/106 (!) 140/56  Pulse:      Resp: (!) 30 14 (!) 24 15  Temp:      TempSrc:      SpO2: 100% 99% 100% 99%  Weight:      Height:        Intake/Output Summary (Last 24 hours) at 10/25/2021 1853 Last data filed at 10/25/2021 1600 Gross per 24 hour  Intake 1050 ml  Output --  Net 1050 ml      10/25/2021    2:24 PM 05/03/2021    9:25 AM 04/24/2021    3:40 PM  Last 3 Weights  Weight (lbs) 155 lb 3.2 oz 156 lb 156 lb  Weight (kg) 70.398 kg 70.761 kg 70.761 kg     Body mass index is 22.92 kg/m.  General:  Well nourished, well developed, in no acute distress, soft-spoken HEENT: normal Neck: no JVD Vascular: No carotid bruits; Distal pulses 2+ bilaterally Cardiac:  normal S1, S2; RRR; no murmur  Lungs:  clear to auscultation bilaterally, no wheezing, rhonchi or rales  Abd: soft, nontender, no hepatomegaly  Ext: no edema Musculoskeletal:  No deformities, BUE and BLE strength normal and equal Skin: warm and dry  Neuro:  CNs 2-12 intact, no focal abnormalities noted Psych:  Normal affect   EKG:  The EKG was personally reviewed and demonstrates: Sinus rhythm, PVCs, bigeminal pattern, heart rate  87  Telemetry:  Telemetry was personally reviewed and demonstrates: Sinus rhythm, frequent PVCs  Relevant CV Studies: TTE 03/2021 1. Left ventricular ejection fraction, by estimation, is 55 to 60%. The  left ventricle has normal function. The left ventricle has no regional  wall motion abnormalities. Left ventricular diastolic parameters are  consistent with Grade II diastolic  dysfunction (pseudonormalization).   2. Right ventricular systolic function is normal. The right ventricular  size is normal.   3. The mitral valve is normal in structure. Mild mitral valve  regurgitation. No evidence of mitral stenosis.   4. The aortic valve was not well visualized. Aortic valve regurgitation  is not visualized. Aortic valve sclerosis is present, with no evidence of  aortic valve stenosis.   5. The inferior vena cava is normal in size with greater than 50%  respiratory variability, suggesting right atrial pressure of 3 mmHg.   Laboratory Data:  High Sensitivity Troponin:   Recent Labs  Lab 10/25/21 1403 10/25/21 1611  TROPONINIHS 3 4     Chemistry Recent Labs  Lab 10/25/21 1403  NA 137  K 6.2*  CL 109  CO2 22  GLUCOSE 83  BUN 20  CREATININE 1.31*  CALCIUM 9.1  MG 2.1  GFRNONAA 57*  ANIONGAP 6    Recent Labs  Lab 10/25/21 1403  PROT 7.1  ALBUMIN 3.9  AST 23  ALT 14  ALKPHOS 95  BILITOT 2.6*   Lipids No results for input(s): "CHOL", "TRIG", "HDL", "LABVLDL", "LDLCALC", "CHOLHDL" in the last 168 hours.  Hematology Recent Labs  Lab 10/25/21 1403  WBC 9.3  RBC RESULTS UNAVAILABLE DUE TO INTERFERING SUBSTANCE  HGB 11.8*  HCT RESULTS UNAVAILABLE DUE TO INTERFERING SUBSTANCE  MCV RESULTS  UNAVAILABLE DUE TO INTERFERING SUBSTANCE  MCH RESULTS UNAVAILABLE DUE TO INTERFERING SUBSTANCE  MCHC RESULTS UNAVAILABLE DUE TO INTERFERING SUBSTANCE  RDW RESULTS UNAVAILABLE DUE TO INTERFERING SUBSTANCE  PLT 339   Thyroid  Recent Labs  Lab 10/25/21 1403  TSH 1.784     BNP Recent Labs  Lab 10/25/21 1611  BNP 179.9*    DDimer No results for input(s): "DDIMER" in the last 168 hours.   Radiology/Studies:  DG Chest 2 View  Result Date: 10/25/2021 CLINICAL DATA:  Weakness, atrial fibrillation, hypotension EXAM: CHEST - 2 VIEW COMPARISON:  03/13/2019 FINDINGS: Cardiomegaly. Both lungs are clear. The visualized skeletal structures are unremarkable. IMPRESSION: Cardiomegaly without acute abnormality of the lungs. Electronically Signed   By: Delanna Ahmadi M.D.   On: 10/25/2021 15:54     Assessment and Plan:   History of VT, frequent PVCs -EKG shows bigeminy, heart rate 87 -Measurement of heart rate at facility with pulse oximeter was erroneous.  Portable pulse oximetry was measuring every other beat, as such, heart rates of 40s were actually 80s.  This was demonstrated at bedside to patient and RN staff.  Patient DOES NOT have bradycardia. -Restart Toprol-XL 25 mg daily -Previously evaluated by EP as outpatient, due to comorbidities, aggressive medical suppression of PVCs not recommended. -Echo performed earlier this year showed normal EF  2.  Hypertension -Start Toprol-XL 25 mg daily  3.  Dehydration, AKI, hyperkalemia -Management as per medicine team  Total encounter time more than 85 minutes  Greater than 50% was spent in counseling and coordination of care with the patient    Signed, Kate Sable, MD  10/25/2021 6:53 PM

## 2021-10-25 NOTE — H&P (Signed)
History and Physical    Patient: Philip Gordon YIR:[id number] DOB: 05/02/1950 DOA: 10/25/2021 DOS: the patient was seen and examined on 10/25/2021 PCP: Rica Koyanagi, MD  Patient coming from: SNF  Chief Complaint:  Chief Complaint  Patient presents with   Abnormal ECG    Patient from [facility] - sent for uncontrolled A-fib and hypotension; Only complaint from patient is that he feels week   HPI: Philip Gordon is a 71 y.o. male with medical history significant for multiple sclerosis, hypertension, history of prostate cancer, depression who was sent to the ER for evaluation of episodes of bradycardia.  Patient was noted to have heart rate in the low 20s.  His metoprolol was recently discontinued due to bradycardia. Chart review shows that patient was seen by EP for evaluation of nonsustained V. Tach in 02/23.  Per EP physician, due to patient's multiple medical comorbidities he did not recommend aggressive medical suppression of patient's PVCs. Patient complains of generalized weakness but denies having any chest pain, no dizziness, no light headedness, no cough, no shortness of breath, no fever, no chills, no headache, no blurred vision, no focal deficit. Patient states that he fell 2 nights ago while trying to pick up his urinal but denied feeling dizzy or lightheaded. Patient had a twelve-lead EKG which shows sinus rhythm with ventricular bigeminy.  Labs showed a potassium of 6.2. Patient received calcium gluconate, Lokelma, IV fluids in the ER and admission was requested for observation.    Review of Systems: As mentioned in the history of present illness. All other systems reviewed and are negative. Past Medical History:  Diagnosis Date   Depression    Hypercholesterolemia    Hypertension    Multiple sclerosis (HCC)    Sees Dr Ala Bent [facility])   Prostate [facility])    Followed by Dr [name] and Dr [name]   Past Surgical History:  Procedure Laterality Date   KNEE  SURGERY     Social History:  reports that he has never smoked. His smokeless tobacco use includes chew. He reports that he does not drink alcohol and does not use drugs.  Allergies  Allergen Reactions   Gabapentin     Other reaction(s): Dizziness   Pregabalin     Other reaction(s): Dizziness    Family History  Problem Relation Age of Onset   Hypertension Mother    Stroke Father    Diabetes Maternal Grandmother    Diabetes Maternal Grandfather    Leukemia Sister        died - age 65    Prior to Admission medications   Medication Sig Start Date End Date Taking? Authorizing Provider  acetaminophen (TYLENOL) 325 MG tablet Take by mouth every 6 (six) hours as needed. As needed for pain Patient not taking: Reported on 07/07/2021    [provider]  acidophilus (RISAQUAD) CAPS capsule Take 1 capsule by mouth daily. Patient not taking: Reported on 07/07/2021    [provider]  Cetirizine HCl (ZYRTEC ALLERGY) 10 MG CAPS Take by mouth.    [provider]  Cholecalciferol 250 MCG (10000 UT) TABS Take 1 tablet by mouth daily. Patient not taking: Reported on 07/07/2021    [provider]  ezetimibe (ZETIA) 10 MG tablet Take 1 tablet (10 mg total) by mouth daily. Patient not taking: Reported on 07/07/2021 10/14/18   Einar Pheasant, MD  ferrous sulfate 325 (65 FE) MG tablet Take 325 mg by mouth daily with breakfast. Patient not taking:  Reported on 07/07/2021    [provider]  finasteride (PROSCAR) 5 MG tablet Take 1 tablet (5 mg total) by mouth daily. 09/03/19   Stoioff, Ronda Fairly, MD  fluticasone (FLONASE) 50 MCG/ACT nasal spray Place 2 sprays into both nostrils daily. 03/14/17   Einar Pheasant, MD  lidocaine (LIDODERM) 5 % Place 1 patch onto the skin daily. Remove & Discard patch within 12 hours or as directed by MD    [provider]  melatonin 5 MG TABS Take 5 mg by mouth at bedtime.    [provider]  metoprolol succinate (TOPROL  XL) 25 MG 24 hr tablet Take 1 tablet (25 mg total) by mouth daily. Patient taking differently: Take 12.5 mg by mouth daily. 03/10/21   Kate Sable, MD  mirtazapine (REMERON) 7.5 MG tablet Take 7.5 mg by mouth at bedtime. 06/08/20   [provider]  Multiple Vitamin (MULTIVITAMIN) capsule Take 1 capsule by mouth daily.    [provider]  Polyethyl Glycol-Propyl Glycol (SYSTANE) 0.4-0.3 % SOLN Apply 1 drop to eye in the morning and at bedtime. In both eyes (OU) as needed for dry eyes.    [provider]  sertraline (ZOLOFT) 25 MG tablet Take 1 tablet (25 mg total) by mouth daily. 06/25/19   Einar Pheasant, MD  tiZANidine (ZANAFLEX) 2 MG tablet Take 2 mg by mouth at bedtime.    [provider]  vitamin B-12 (CYANOCOBALAMIN) 1000 MCG tablet Take 1 tablet (1,000 mcg total) by mouth daily. 10/25/15   Einar Pheasant, MD    Physical Exam: Vitals:   10/25/21 1500 10/25/21 1515 10/25/21 1600 10/25/21 1615  BP: (!) 160/62 (!) 173/68 (!) 154/59 (!) 155/72  Pulse: 81 78  87  Resp: 19 (!) 25 15 (!) 23  Temp:      TempSrc:      SpO2: 100% 99% 100% 100%  Weight:      Height:       Physical Exam Vitals and nursing note reviewed.  Constitutional:      Appearance: Normal appearance.  HENT:     Head: Normocephalic.     Nose: Nose normal.     Mouth/Throat:     Mouth: Mucous membranes are moist.  Eyes:     Conjunctiva/sclera: Conjunctivae normal.  Cardiovascular:     Rate and Rhythm: Bradycardia present.  Pulmonary:     Effort: Pulmonary effort is normal.     Breath sounds: Normal breath sounds.  Abdominal:     General: Abdomen is flat. Bowel sounds are normal.     Palpations: Abdomen is soft.  Musculoskeletal:        General: Normal range of motion.     Cervical back: Normal range of motion and neck supple.  Skin:    General: Skin is warm and dry.  Neurological:     Mental Status: He is alert.     Motor: Weakness present.  Psychiatric:        Mood  and Affect: Mood normal.        Behavior: Behavior normal.     Data Reviewed: Relevant notes from primary care and specialist visits, past discharge summaries as available in EHR, including Care Everywhere. Prior diagnostic testing as pertinent to current admission diagnoses Updated medications and problem lists for reconciliation ED course, including vitals, labs, imaging, treatment and response to treatment Triage notes, nursing and pharmacy notes and ED provider's notes Notable results as noted in HPI Labs reviewed.  Troponin 4, BNP  179, white count 9.3, hemoglobin 11.8, TSH 1.78, sodium 137, potassium 6.2, chloride 109, bicarb 22, glucose 83, BUN 20, creatinine 1.31, calcium 9.1, total protein 7.1, albumin 3.9, AST 23, ALT 14, alkaline phosphatase 95, total bilirubin 2.6, magnesium 2.1 Chest x-ray reviewed by me shows cardiomegaly with clear lungs  Twelve-lead EKG reviewed by me shows sinus rhythm with ventricular bigeminy. There are no new results to review at this time.  Assessment and Plan: * Hyperkalemia Patient presents to the ER for evaluation of weakness and bradycardia and found to have a potassium level of 6.2. TSH is within normal limits. Unclear etiology for patient's hyperkalemia r/o RTA IV Patient received IV calcium gluconate, dextrose, insulin, Lokelma Repeat potassium levels  Sinus bradycardia Most likely multifactorial related to beta-blocker use as well as hyperkalemia. Patient was on metoprolol which has since been discontinued We will treat hyperkalemia Expect improvement in patient's heart rate following resolution of hyperkalemia. We will consult cardiology  Depression Stable Continue sertraline and mirtazapine  History of prostate cancer Stable  Hypertension We will start patient on low-dose amlodipine for blood pressure control      Advance Care Planning:   Code Status: Prior Do not resuscitate  Consults: Cardiology  Family Communication:  Greater than 50% of time was spent discussing patient's condition and plan of care with his healthcare power of attorney at the bedside.  All questions and concerns have been addressed.  She verbalizes understanding and agrees with the plan.  CODE STATUS was discussed and he is a DNR  Severity of Illness: The appropriate patient status for this patient is OBSERVATION. Observation status is judged to be reasonable and necessary in order to provide the required intensity of service to ensure the patient's safety. The patient's presenting symptoms, physical exam findings, and initial radiographic and laboratory data in the context of their medical condition is felt to place them at decreased risk for further clinical deterioration. Furthermore, it is anticipated that the patient will be medically stable for discharge from the hospital within 2 midnights of admission.   Author: Collier Bullock, MD 10/25/2021 5:36 PM  For on call review [email or web address].

## 2021-10-25 NOTE — Telephone Encounter (Signed)
Called and spoke with Desta at [facility]. She informed me that the patient has been having very low HR running between 26-28. I asked if this was being checked manually as the patient has a known history of frequent PVC's. She stated that all of his pulse checks were done manually. She stated that the patients primary discontinued his Metoprolol on Mon 10/23/21 when his HR was in the 30's. She stated that he is also symptomatic and is not responding well. I immediately advised that they get the patient to the ED. She replied, "That's what we were thinking". She then continued to ask if we had ever recommended a pacemaker. I informed her that at his last OV in 04/2021 with Dr. Quentin Ore, he was seen for Ventricular Tachycardia and frequent PVC's. Again, I advised that the patient be taken to the ED immediately. Desta agreed. Phone call ended.

## 2021-10-25 NOTE — Assessment & Plan Note (Signed)
We will start patient on low-dose amlodipine for blood pressure control

## 2021-10-25 NOTE — ED Provider Notes (Signed)
-----------------------------------------   4:45 PM on 10/25/2021 ----------------------------------------- Patient's labs have resulted showing a troponin of 4.  Urinalysis nonrevealing.  CBC does not show any obvious abnormality on what has resulted thus far.  Patient's chemistry does show a potassium of 6.2.  Patient has received Lokelma as well as calcium.  We will continue with IV hydration.  Given the patient's reported weakness palpitations with bigeminy on EKG and a potassium of 6.2 we will admit to the hospital service for ongoing work-up and management.  Patient's POA is agreeable to admission as well.   Harvest Dark, MD 10/25/21 [phone]

## 2021-10-25 NOTE — ED Provider Notes (Signed)
[facility] Provider Note    Event Date/Time   First MD Initiated Contact with Patient 10/25/21 1348     (approximate)   History   Abnormal ECG (Patient from [facility] - sent for uncontrolled A-fib and hypotension; Only complaint from patient is that he feels week)   HPI  Philip Gordon is a 71 y.o. male with a past medical history of MS, HTN, HDL, depression, prostate cancer, chronic immobility and pain followed by palliative care with recent documentation from 4/14 note P showing patient is to be DNR with comfort measures who presents via EMS from nursing facility for evaluation of generalized weakness and reportedly uncontrolled A-fib and low blood pressure.  History of A-fib.  He states he has an appointment to see cardiologist next week for some palpitations.  He denies any chest pain, cough, shortness of breath, headache, earache or sore throat.  States he uses a walker to get around.    Past Medical History:  Diagnosis Date   Depression    Hypercholesterolemia    Hypertension    Multiple sclerosis ([place])    Sees Dr Ala [facility])   Prostate cancer [facility])    Followed by Dr [name] and Dr [name]     Physical Exam  Triage Vital Signs: ED Triage Vitals  Enc Vitals Group     BP      Pulse      Resp      Temp      Temp src      SpO2      Weight      Height      Head Circumference      [facility]      Pain Score      Pain Loc      Pain Edu?      Excl. in [place]?     Most recent vital signs: Vitals:   10/25/21 1500 10/25/21 1515  BP: (!) 160/62 (!) 173/68  Pulse: 81 78  Resp: 19 (!) 25  Temp:    SpO2: 100% 99%    General: Awake, no distress.  CV:  Good peripheral perfusion.  2+ radial pulse. Resp:  Normal effort.  Clear bilaterally. Abd:  No distention.  Soft throughout. Other:  Some minimal lower extremity edema is symmetric.  Patient otherwise stable moves extremities on command.   ED Results / Procedures / Treatments   Labs (all labs ordered are listed, but only abnormal results are displayed) Labs Reviewed  CBC WITH DIFFERENTIAL/PLATELET - Abnormal; Notable for the following components:      Result Value   Hemoglobin 11.8 (*)    All other components within normal limits  COMPREHENSIVE METABOLIC PANEL - Abnormal; Notable for the following components:   Potassium 6.2 (*)    Creatinine, Ser 1.31 (*)    Total Bilirubin 2.6 (*)    GFR, Estimated 57 (*)    All other components within normal limits  SARS CORONAVIRUS 2 BY RT PCR  MAGNESIUM  TSH  PROTIME-INR  URINALYSIS, COMPLETE (UACMP) WITH MICROSCOPIC  BRAIN NATRIURETIC PEPTIDE  TROPONIN I (HIGH SENSITIVITY)  TROPONIN I (HIGH SENSITIVITY)     EKG  EKG is remarkable sinus rhythm with ventricular rate of 87, normal axis, ventricular bigeminy, left anterior fascicle block with nonspecific ST changes throughout but no other clear evidence of acute ischemia.   RADIOLOGY   PROCEDURES:  Critical Care performed: Yes, see critical care procedure note(s)  .Critical Care  Performed by: Lucrezia Starch, MD Authorized by: Lucrezia Starch, MD   Critical care provider statement:    Critical care time (minutes):  30   Critical care was necessary to treat or prevent imminent or life-threatening deterioration of the following conditions:  Metabolic crisis (hyperkalemia)   Critical care was time spent personally by me on the following activities:  Development of treatment plan with patient or surrogate, discussions with consultants, evaluation of patient's response to treatment, examination of patient, ordering and review of laboratory studies, ordering and review of radiographic studies, ordering and performing treatments and interventions, pulse oximetry, re-evaluation of patient's condition and review of old charts .1-3 Lead EKG Interpretation  Performed by: Lucrezia Starch, MD Authorized by: Lucrezia Starch, MD     Interpretation: normal     ECG  rate assessment: normal     Rhythm: sinus rhythm     Ectopy: none     Conduction: normal     The patient is on the cardiac monitor to evaluate for evidence of arrhythmia and/or significant heart rate changes.   MEDICATIONS ORDERED IN ED: Medications  calcium gluconate 1 g/ 50 mL sodium chloride IVPB (1,000 mg Intravenous New Bag/Given 10/25/21 1456)  sodium zirconium cyclosilicate (LOKELMA) packet 10 g (has no administration in time range)  lactated ringers bolus 1,000 mL (1,000 mLs Intravenous New Bag/Given 10/25/21 1456)     IMPRESSION / MDM / ASSESSMENT AND PLAN / ED COURSE  I reviewed the triage vital signs and the nursing notes. Patient's presentation is most consistent with acute presentation with potential threat to life or bodily function.                               Differential diagnosis includes, but is not limited to, generalized weakness and abnormal heart rhythm related to ACS, or metabolic derangements, anemia, hypothyroidism or other symptomatic arrhythmia as it seems on review of the L paper records accompanying patient he stopped his metoprolol 2 days ago.  EKG is remarkable sinus rhythm with ventricular rate of 87, normal axis, ventricular bigeminy, left anterior fascicle block with nonspecific ST changes throughout but no other clear evidence of acute ischemia.  CBC without leukocytosis and hemoglobin of 11.8 compared to 10.411 months ago and normal platelets.  CMP is marked for K of 6.2, creatinine of 1.31 compared to 1.111 months ago with no other significant derangements.  Patient has been chronically elevated bilirubin at 2.6 compared to 2.711 months ago.  Magnesium and TSH are unremarkable.  Initial troponin is nonelevated and overall clinical picture is not suggestive of ACS at this time.  Discussed patient's presentation work-up with on-call cardiologist Dr.   Garen Lah  It felt after reviewing patient's EKG that this would be unrelated to the hyperkalemia.   I suspect his mild hyperkalemia is related to some dehydration given his AKI.  We will plan to hydrate.  Also obtain a chest x-ray.  Given he seems fairly symptomatic from this with fairly significant worsening weakness I think it is reasonable for him to be admitted.  In addition I reviewed his EMS EKG strips that showed appear to be a fascicle block PVCs.  Care of patient signed over to assuming provider at approximately 1530 with plan to follow-up chest x-ray and admit.  We will also give a dose of calcium and Lokelma.      FINAL CLINICAL IMPRESSION(S) / ED DIAGNOSES   Final diagnoses:  Weakness  Abnormal ECG  Hyperkalemia  AKI (acute kidney injury) ([place])     Rx / DC Orders   ED Discharge Orders     None        Note:  This document was prepared using Dragon voice recognition software and may include unintentional dictation errors.   Lucrezia Starch, MD 10/25/21 [phone]

## 2021-10-26 DIAGNOSIS — R9431 Abnormal electrocardiogram [ECG] [EKG]: Secondary | ICD-10-CM

## 2021-10-26 DIAGNOSIS — R531 Weakness: Secondary | ICD-10-CM

## 2021-10-26 DIAGNOSIS — R001 Bradycardia, unspecified: Secondary | ICD-10-CM | POA: Diagnosis not present

## 2021-10-26 DIAGNOSIS — E875 Hyperkalemia: Secondary | ICD-10-CM | POA: Diagnosis not present

## 2021-10-26 DIAGNOSIS — I472 Ventricular tachycardia, unspecified: Secondary | ICD-10-CM

## 2021-10-26 DIAGNOSIS — I10 Essential (primary) hypertension: Secondary | ICD-10-CM | POA: Diagnosis not present

## 2021-10-26 DIAGNOSIS — I493 Ventricular premature depolarization: Secondary | ICD-10-CM

## 2021-10-26 LAB — CBC
HCT: 27.2 % — ABNORMAL LOW (ref 39.0–52.0)
Hemoglobin: 9.2 g/dL — ABNORMAL LOW (ref 13.0–17.0)
MCH: 33.6 pg (ref 26.0–34.0)
MCHC: 33.8 g/dL (ref 30.0–36.0)
MCV: 99.3 fL (ref 80.0–100.0)
Platelets: 340 10*3/uL (ref 150–400)
RBC: 2.74 MIL/uL — ABNORMAL LOW (ref 4.22–5.81)
RDW: 17.3 % — ABNORMAL HIGH (ref 11.5–15.5)
WBC: 7.3 10*3/uL (ref 4.0–10.5)
nRBC: 0 % (ref 0.0–0.2)

## 2021-10-26 LAB — GLUCOSE, CAPILLARY
Glucose-Capillary: 92 mg/dL (ref 70–99)
Glucose-Capillary: 94 mg/dL (ref 70–99)
Glucose-Capillary: 93 mg/dL (ref 70–99)
Glucose-Capillary: 108 mg/dL — ABNORMAL HIGH (ref 70–99)
Glucose-Capillary: 98 mg/dL (ref 70–99)
Glucose-Capillary: 131 mg/dL — ABNORMAL HIGH (ref 70–99)

## 2021-10-26 LAB — BASIC METABOLIC PANEL
Anion gap: 4 — ABNORMAL LOW (ref 5–15)
BUN: 15 mg/dL (ref 8–23)
CO2: 21 mmol/L — ABNORMAL LOW (ref 22–32)
Calcium: 8.3 mg/dL — ABNORMAL LOW (ref 8.9–10.3)
Chloride: 112 mmol/L — ABNORMAL HIGH (ref 98–111)
Creatinine, Ser: 1.27 mg/dL — ABNORMAL HIGH (ref 0.61–1.24)
GFR, Estimated: 60 mL/min — ABNORMAL LOW (ref >60)
Glucose, Bld: 95 mg/dL (ref 70–99)
Potassium: 3.9 mmol/L (ref 3.5–5.1)
Sodium: 137 mmol/L (ref 135–145)

## 2021-10-26 MED ORDER — POTASSIUM CHLORIDE CRYS ER 20 MEQ PO TBCR
40.0000 meq | EXTENDED_RELEASE_TABLET | Freq: Once | ORAL | Status: AC
  Administered 2021-10-26: 40 meq via ORAL
  Filled 2021-10-26: qty 2

## 2021-10-26 NOTE — Progress Notes (Signed)
Progress Note  Patient Name: Philip Gordon Date of Encounter: 10/26/2021  [facility] Cardiologist: Kate Sable, MD   Subjective   Patient seen on AM rounds. Denies any chest pain or shortness of breath. Potassium improved to 3.9. Hgb dropped from 11.8 to 9.2.  Inpatient Medications    Scheduled Meds:  cyanocobalamin  1,000 mcg Oral Daily   dextrose  1 ampule Intravenous Once   enoxaparin (LOVENOX) injection  40 mg Subcutaneous Q24H   finasteride  5 mg Oral Daily   lidocaine  1 patch Transdermal Q24H   melatonin  5 mg Oral QHS   metoprolol succinate  25 mg Oral Daily   mirtazapine  7.5 mg Oral QHS   multivitamin with minerals  1 tablet Oral Daily   sertraline  25 mg Oral Daily   tiZANidine  2 mg Oral QHS   Continuous Infusions:  dextrose 30 mL/hr at 10/26/21 0315   PRN Meds: acetaminophen **OR** acetaminophen, ondansetron **OR** ondansetron (ZOFRAN) IV, polyvinyl alcohol   Vital Signs    Vitals:   10/25/21 2147 10/25/21 2318 10/26/21 0429 10/26/21 0723  BP: (!) 146/64 (!) 107/52 (!) 97/51 (!) 108/58  Pulse:   (!) 52 84  Resp: '18 18 18 17  '$ Temp: 97.6 F (36.4 C) 98.1 F (36.7 C) 97.9 F (36.6 C) 97.8 F (36.6 C)  TempSrc: Oral Oral    SpO2: 100% 100% 100% 99%  Weight:      Height:        Intake/Output Summary (Last 24 hours) at 10/26/2021 0752 Last data filed at 10/26/2021 0315 Gross per 24 hour  Intake 2191.88 ml  Output --  Net 2191.88 ml      10/25/2021    2:24 PM 05/03/2021    9:25 AM 04/24/2021    3:40 PM  Last 3 Weights  Weight (lbs) 155 lb 3.2 oz 156 lb 156 lb  Weight (kg) 70.398 kg 70.761 kg 70.761 kg      Telemetry    Sinus with ventricular bigeminy rates of 60-70 - Personally Reviewed  ECG    No new tracings - Personally Reviewed  Physical Exam   GEN: No acute distress.   Neck: No JVD Cardiac: RRR, no murmurs, rubs, or gallops.  Respiratory: Clear to auscultation bilaterally. Respirations are unlabored on room air. GI:  Soft, nontender, non-distended  MS: No edema; No deformity. Neuro:  Nonfocal  Psych: Normal affect   Labs    High Sensitivity Troponin:   Recent Labs  Lab 10/25/21 1403 10/25/21 1611  TROPONINIHS 3 4     Chemistry Recent Labs  Lab 10/25/21 1403 10/25/21 1949 10/25/21 2252 10/26/21 0457  NA 137 139 137 137  K 6.2* 3.5 3.1* 3.9  CL 109 109 109 112*  CO2 '22 23 22 '$ 21*  GLUCOSE 83 91 117* 95  BUN '20 18 16 15  '$ CREATININE 1.31* 1.31* 1.28* 1.27*  CALCIUM 9.1 8.7* 8.2* 8.3*  MG 2.1  --   --   --   PROT 7.1  --  5.3*  --   ALBUMIN 3.9  --  2.9*  --   AST 23  --  20  --   ALT 14  --  11  --   ALKPHOS 95  --  76  --   BILITOT 2.6*  --  1.4*  --   GFRNONAA 57* 57* 59* 60*  ANIONGAP '6 7 6 '$ 4*    Lipids No results for input(s): "CHOL", "TRIG", "HDL", "LABVLDL", "  [place]", "CHOLHDL" in the last 168 hours.  Hematology Recent Labs  Lab 10/25/21 1403  WBC 9.3  RBC RESULTS UNAVAILABLE DUE TO INTERFERING SUBSTANCE  HGB 11.8*  HCT RESULTS UNAVAILABLE DUE TO INTERFERING SUBSTANCE  MCV RESULTS UNAVAILABLE DUE TO INTERFERING SUBSTANCE  MCH RESULTS UNAVAILABLE DUE TO INTERFERING SUBSTANCE  MCHC RESULTS UNAVAILABLE DUE TO INTERFERING SUBSTANCE  RDW RESULTS UNAVAILABLE DUE TO INTERFERING SUBSTANCE  PLT 339   Thyroid  Recent Labs  Lab 10/25/21 1403  TSH 1.784    BNP Recent Labs  Lab 10/25/21 1611  BNP 179.9*    DDimer No results for input(s): "DDIMER" in the last 168 hours.   Radiology    DG Chest 2 View  Result Date: 10/25/2021 CLINICAL DATA:  Weakness, atrial fibrillation, hypotension EXAM: CHEST - 2 VIEW COMPARISON:  03/13/2019 FINDINGS: Cardiomegaly. Both lungs are clear. The visualized skeletal structures are unremarkable. IMPRESSION: Cardiomegaly without acute abnormality of the lungs. Electronically Signed   By: Delanna Ahmadi M.D.   On: 10/25/2021 15:54    Cardiac Studies  Echocardiogram 04/14/21 1. Left ventricular ejection fraction, by estimation, is 55 to 60%.  The  left ventricle has normal function. The left ventricle has no regional  wall motion abnormalities. Left ventricular diastolic parameters are  consistent with Grade II diastolic  dysfunction (pseudonormalization).   2. Right ventricular systolic function is normal. The right ventricular  size is normal.   3. The mitral valve is normal in structure. Mild mitral valve  regurgitation. No evidence of mitral stenosis.   4. The aortic valve was not well visualized. Aortic valve regurgitation  is not visualized. Aortic valve sclerosis is present, with no evidence of  aortic valve stenosis.   5. The inferior vena cava is normal in size with greater than 50%  respiratory variability, suggesting right atrial pressure of 3 mmHg.   Patient Profile     71 y.o. male with a history of hypertension, MS, PVC's who is being seen and evaluated for bradycardia.   Assessment & Plan    History of VT, frequent PVC's -telemetry tracings revealed bigeminy rate of rate 60-70's -no bradycardia noted -continue metoprolol XL 25 mg daily -previously evaluated by EP as outpatient, due to comorbidities, aggressive medical suppression of PVC's not recommended  -LVEF 55-60% 03/2021  Hypertension -blood pressure 108/58 -continue metoprolol  -vital sign per unit protocol  Dehydration, AKI, hyperkalemia -once returns to nursing facility ensure adequate hydration and fluid availability -management per IM    For questions or updates, please contact [place] Please consult [email or web address] for contact info under        Signed, Karlen Barbar, NP  10/26/2021, 7:52 AM

## 2021-10-26 NOTE — TOC Initial Note (Signed)
Transition of Care [facility]) - Initial/Assessment Note    Patient Details  Name: Philip Gordon MRN: [id number] Date of Birth: 09-15-1950  [facility] CM/SW Contact:    Alberteen Sam, LCSW Phone Number: 10/26/2021, 8:42 AM  Clinical Narrative:                  CSW notes patient is from [facility] long term, per Tammy at [facility] would not be needed for patient to return to [facility] at discharge.   Pending medical readiness to return to [facility].   Expected Discharge Plan: Skilled Nursing Facility Barriers to Discharge: Continued Medical Work up   Patient Goals and CMS Choice Patient states their goals for this hospitalization and ongoing recovery are:: to go home CMS Medicare.gov Compare Post Acute Care list provided to:: Patient Choice offered to / list presented to : Patient  Expected Discharge Plan and Services Expected Discharge Plan: [place]       Living arrangements for the past 2 months: [place]                                      Prior Living Arrangements/Services Living arrangements for the past 2 months: [place]                     Activities of Daily Living Home Assistive Devices/Equipment: Brace (specify type), Walker (specify type) ADL Screening (condition at time of admission) Patient's cognitive ability adequate to safely complete daily activities?: No Is the patient deaf or have difficulty hearing?: No Does the patient have difficulty seeing, even when wearing glasses/contacts?: No Does the patient have difficulty concentrating, remembering, or making decisions?: Yes Patient able to express need for assistance with ADLs?: Yes Does the patient have difficulty dressing or bathing?: No Independently performs ADLs?: No Communication: Independent Dressing (OT): Needs assistance Is this a change from baseline?: Pre-admission baseline Grooming: Needs assistance Is this  a change from baseline?: Pre-admission baseline Feeding: Independent Bathing: Needs assistance Is this a change from baseline?: Pre-admission baseline Toileting: Needs assistance Is this a change from baseline?: Pre-admission baseline In/Out Bed: Needs assistance Is this a change from baseline?: Pre-admission baseline Walks in Home: Needs assistance Is this a change from baseline?: Pre-admission baseline Does the patient have difficulty walking or climbing stairs?: Yes Weakness of Legs: Both Weakness of Arms/Hands: Both  Permission Sought/Granted                  Emotional Assessment              Admission diagnosis:  Hyperkalemia [E87.5] Weakness [R53.1] Abnormal ECG [R94.31] AKI (acute kidney injury) ([place]) [N17.9] Patient Active Problem List   Diagnosis Date Noted   Hyperkalemia 10/25/2021   Sinus bradycardia 10/25/2021   Depression    Bigeminy    Chronic back pain 09/28/2020   Autoimmune hemolytic anemia ([place]) 04/18/2019   Fall 01/23/2019   Weakness 01/21/2019   AKI (acute kidney injury) ([place]) 01/21/2019   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[place]) 09/21/2017   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   Bilateral foot-drop 02/04/2015   Sinus infection 11/15/2014   Right mandibular swelling 11/15/2014   Brain lesion 01/17/2014   Obstruction of urinary tract 10/15/2012   Bladder outlet obstruction 10/15/2012   Disorder of bladder function 01/16/2012   Incomplete bladder emptying 01/16/2012   History of prostate  cancer 01/16/2012   DS (disseminated sclerosis) ([place]) 01/16/2012   Hypertension 12/28/2011   Hypercholesteremia 12/28/2011   Multiple sclerosis ([place]) 12/28/2011   B12 deficiency 12/28/2011   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

## 2021-10-26 NOTE — NC FL2 (Signed)
[place] LEVEL OF CARE SCREENING TOOL     IDENTIFICATION  Patient Name: Philip Gordon Birthdate: 10-May-1950 Sex: male Admission Date (Current Location): 10/25/2021  [facility] and [place] Number:  Engineering geologist and Address:  [facility], [address], [place], [place] [address]      Provider Number: [id number]  Attending Physician Name and Address:  Shawna Clamp, MD  Relative Name and Phone Number:  [name] (other) [phone]    Current Level of Care: Hospital Recommended Level of Care: [place] Prior Approval Number:    Date Approved/Denied:   PASRR Number: [id number]  Discharge Plan: SNF    Current Diagnoses: Patient Active Problem List   Diagnosis Date Noted   Hyperkalemia 10/25/2021   Sinus bradycardia 10/25/2021   Depression    Bigeminy    Chronic back pain 09/28/2020   Autoimmune hemolytic anemia ([place]) 04/18/2019   Fall 01/23/2019   Weakness 01/21/2019   AKI (acute kidney injury) ([place]) 01/21/2019   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   Bilateral foot-drop 02/04/2015   Sinus infection 11/15/2014   Right mandibular swelling 11/15/2014   Brain lesion 01/17/2014   Obstruction of urinary tract 10/15/2012   Bladder outlet obstruction 10/15/2012   Disorder of bladder function 01/16/2012   Incomplete bladder emptying 01/16/2012   History of prostate cancer 01/16/2012   DS (disseminated sclerosis) ([place]) 01/16/2012   Hypertension 12/28/2011   Hypercholesteremia 12/28/2011   Multiple sclerosis ([place]) 12/28/2011   B12 deficiency 12/28/2011   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/2011   Acquired ankle/foot deformity 12/07/2010    Orientation RESPIRATION BLADDER Height & Weight     Self, Time, Situation, Place  Normal Incontinent, External catheter Weight: 155 lb 3.2 oz (70.4 kg) Height:  '5\' 9"'$  (175.3 cm)  BEHAVIORAL SYMPTOMS/MOOD  NEUROLOGICAL BOWEL NUTRITION STATUS      Continent Diet (see discharge summary)  AMBULATORY STATUS COMMUNICATION OF NEEDS Skin   Limited Assist Verbally Other (Comment) (sternum open wound)                       Personal Care Assistance Level of Assistance  Bathing, Feeding, Dressing, Total care Bathing Assistance: Limited assistance Feeding assistance: Independent Dressing Assistance: Limited assistance Total Care Assistance: Limited assistance   Functional Limitations Info  Sight, Hearing, Speech Sight Info: Adequate Hearing Info: Adequate Speech Info: Adequate    SPECIAL CARE FACTORS FREQUENCY  PT (By licensed PT), OT (By licensed OT)     PT Frequency: min 4x weekly OT Frequency: min 4x weekly            Contractures Contractures Info: Not present    Additional Factors Info  Code Status, Allergies Code Status Info: full Allergies Info: gabapentin, pregabalin           Current Medications (10/26/2021):  This is the current hospital active medication list Current Facility-Administered Medications  Medication Dose Route Frequency Provider Last Rate Last Admin   acetaminophen (TYLENOL) tablet 650 mg  650 mg Oral Q6H PRN Agbata, Tochukwu, MD       Or   acetaminophen (TYLENOL) suppository 650 mg  650 mg Rectal Q6H PRN Agbata, Tochukwu, MD       cyanocobalamin (VITAMIN B12) tablet 1,000 mcg  1,000 mcg Oral Daily Agbata, Tochukwu, MD   1,000 mcg at 10/26/21 0818   dextrose 10 % infusion   Intravenous Continuous Sharion Settler, NP 30 mL/hr at 10/26/21  0315 Infusion Verify at 10/26/21 0315   dextrose 50 % solution 50 mL  1 ampule Intravenous Once Sharion Settler, NP       enoxaparin (LOVENOX) injection 40 mg  40 mg Subcutaneous Q24H Agbata, Tochukwu, MD   40 mg at 10/25/21 2249   finasteride (PROSCAR) tablet 5 mg  5 mg Oral Daily Agbata, Tochukwu, MD   5 mg at 10/26/21 0818   lidocaine (LIDODERM) 5 % 1 patch  1 patch Transdermal Q24H Agbata, Tochukwu, MD        melatonin tablet 5 mg  5 mg Oral QHS Agbata, Tochukwu, MD   5 mg at 10/25/21 2249   metoprolol succinate (TOPROL-XL) 24 hr tablet 25 mg  25 mg Oral Daily Agbor-Etang, Aaron Edelman, MD   25 mg at 10/25/21 2249   mirtazapine (REMERON) tablet 7.5 mg  7.5 mg Oral QHS Agbata, Tochukwu, MD   7.5 mg at 10/25/21 2249   multivitamin with minerals tablet 1 tablet  1 tablet Oral Daily Agbata, Tochukwu, MD   1 tablet at 10/26/21 0818   ondansetron (ZOFRAN) tablet 4 mg  4 mg Oral Q6H PRN Agbata, Tochukwu, MD       Or   ondansetron (ZOFRAN) injection 4 mg  4 mg Intravenous Q6H PRN Agbata, Tochukwu, MD       polyvinyl alcohol (LIQUIFILM TEARS) 1.4 % ophthalmic solution 1 drop  1 drop Both Eyes Q6H PRN Agbata, Tochukwu, MD       sertraline (ZOLOFT) tablet 25 mg  25 mg Oral Daily Agbata, Tochukwu, MD   25 mg at 10/26/21 0818   tiZANidine (ZANAFLEX) tablet 2 mg  2 mg Oral QHS Agbata, Tochukwu, MD   2 mg at 10/25/21 2249     Discharge Medications: Please see discharge summary for a list of discharge medications.  Relevant Imaging Results:  Relevant Lab Results:   Additional Information [id number]  Alberteen Sam, LCSW

## 2021-10-26 NOTE — Progress Notes (Addendum)
PROGRESS NOTE    Philip Gordon  NOM:[id number] DOB: 1951/02/13 DOA: 10/25/2021 PCP: Rica Koyanagi, MD  Brief Narrative:  This 71 years old male with PMH significant for multiple sclerosis, hypertension, history of prostate cancer, depression who was sent in the ED from [facility] for further evaluation of bradycardia.  Patient was noted to have heart rate in the low 20s.  His metoprolol was recently discontinued due to bradycardia.  Chart review shows that patient was seen by EP for the evaluation of nonsustained V. tach in 2/23. Per EP physician's note due to patient's multiple medical comorbidities he did not recommend aggressive medical suppression of patient's PVCs.  Patient reports generalized weakness but denies any chest pain, dizziness, lightheadedness, palpitations, shortness of breath or other symptoms.  Patient fell 2 nights ago while trying to pick up his urinal but denies feeling dizzy or lightheaded.  Twelve-lead EKG shows sinus rhythm with ventricular bigeminy.  Labs significant for hyperkalemia,   Potassium of 6.2.  Patient was given Lokelma, calcium gluconate,  IV fluids and was admitted for further evaluation.  Cardiology is consulted.  Assessment & Plan:   Principal Problem:   Hyperkalemia Active Problems:   Sinus bradycardia   Hypertension   History of prostate cancer   Depression  Hyperkalemia: > Improved. Patient presented in the ED with generalized weakness and bradycardia and found to have potassium level of 6.2. TSH within normal limits.  Unclear etiology of patient's hyperkalemia. Patient has received IV calcium gluconate,  dextrose, insulin, Lokelma. Repeat potassium level normalized. Continue to monitor  Sinus bradycardia: History of ventricular tachycardia: Likely multifactorial and related to the beta-blocker use are as well as hyperkalemia.   EKG shows ventricular bigeminy. Patient was taking metoprolol which has been recently  discontinued. Heart rate has improved with correction of potassium. Cardiology is consulted, patient does not have bradycardia. Patient's heart rate was measured at facility with a pulse oximeter, portable pulse oximeter was measuring every other beat. Metoprolol resumed and continue daily. Patient was previously evaluated by EP as an outpatient, aggressive medical suppression of PVC not recommended due to multiple comorbid conditions. Echocardiogram shows normal LVEF.  Depression: Remains stable, continue Zoloft and Remeron.  History of prostate cancer: Stable.  Continue outpatient follow-up  Essential hypertension: Continue Toprol-XL 25 mg daily.  Generalized weakness: PT and OT evaluation. Patient does have multiple sclerosis.   Patient will go back to [facility] once medically stable.  Hypoglycemia: Patient has hypoglycemic episodes twice last night and started on low-dose dextrose water.  This could be likely due to insulin given to correct hyperkalemia. Continue to monitor blood glucose.   DVT prophylaxis: Lovenox Code Status: Full code Family Communication: No family at bedside Disposition Plan:   Status is: Observation The patient remains OBS appropriate and will d/c before 2 midnights. Admitted for hyperkalemia and sinus bradycardia.  Cardiology is consulted.  Hyperkalemia improved, patient does not have sinus bradycardia.  Continue to monitor.  Anticipated discharge back to [facility] 10/27/2021  Consultants:  Cardiology  Procedures: Echocardiogram Antimicrobials: None  Subjective: Patient was seen and examined at bedside.  Overnight events noted. Patient reports feeling better. He denies any chest pain, shortness of breath or dizziness.  Objective: Vitals:   10/25/21 2318 10/26/21 0429 10/26/21 0723 10/26/21 1132  BP: (!) 107/52 (!) 97/51 (!) 108/58 (!) 125/49  Pulse:  (!) 52 84 81  Resp: '18 18 17 18  '$ Temp: 98.1 F (36.7 C) 97.9 F (36.6 C) 97.8 F (36.6  C) 98.1 F (36.7 C)  TempSrc: Oral   Oral  SpO2: 100% 100% 99% 99%  Weight:      Height:        Intake/Output Summary (Last 24 hours) at 10/26/2021 1349 Last data filed at 10/26/2021 1100 Gross per 24 hour  Intake 2551.88 ml  Output 400 ml  Net 2151.88 ml   Filed Weights   10/25/21 1424  Weight: 70.4 kg    Examination:  General exam: Appears comfortable, not in any acute distress.  Deconditioned Respiratory system: Clear to auscultation bilaterally, no wheezing, no crackles, normal respiratory effort. Cardiovascular system: S1 & S2 heard, regular rate and rhythm, no murmur.  Gastrointestinal system: Abdomen is soft, non tender, non distended, BS+ Central nervous system: Alert and oriented x 3 . No focal neurological deficits. Extremities: No edema, no cyanosis, no clubbing. Skin: No rashes, lesions or ulcers Psychiatry: Judgement and insight appear normal. Mood & affect appropriate.     Data Reviewed: I have personally reviewed following labs and imaging studies  CBC: Recent Labs  Lab 10/25/21 1403 10/26/21 0643  WBC 9.3 7.3  NEUTROABS 6.2  --   HGB 11.8* 9.2*  HCT RESULTS UNAVAILABLE DUE TO INTERFERING SUBSTANCE 27.2*  MCV RESULTS UNAVAILABLE DUE TO INTERFERING SUBSTANCE 99.3  PLT 339 245   Basic Metabolic Panel: Recent Labs  Lab 10/25/21 1403 10/25/21 1949 10/25/21 2252 10/26/21 0457  NA 137 139 137 137  K 6.2* 3.5 3.1* 3.9  CL 109 109 109 112*  CO2 '22 23 22 '$ 21*  GLUCOSE 83 91 117* 95  BUN '20 18 16 15  '$ CREATININE 1.31* 1.31* 1.28* 1.27*  CALCIUM 9.1 8.7* 8.2* 8.3*  MG 2.1  --   --   --    GFR: Estimated Creatinine Clearance: 51.6 mL/min (A) (by C-G formula based on SCr of 1.27 mg/dL (H)). Liver Function Tests: Recent Labs  Lab 10/25/21 1403 10/25/21 2252  AST 23 20  ALT 14 11  ALKPHOS 95 76  BILITOT 2.6* 1.4*  PROT 7.1 5.3*  ALBUMIN 3.9 2.9*   No results for input(s): "LIPASE", "AMYLASE" in the last 168 hours. No results for input(s):  "AMMONIA" in the last 168 hours. Coagulation Profile: Recent Labs  Lab 10/25/21 2152  INR 1.1   Cardiac Enzymes: No results for input(s): "CKTOTAL", "CKMB", "CKMBINDEX", "TROPONINI" in the last 168 hours. BNP (last 3 results) No results for input(s): "PROBNP" in the last 8760 hours. HbA1C: No results for input(s): "HGBA1C" in the last 72 hours. CBG: Recent Labs  Lab 10/25/21 2357 10/26/21 0100 10/26/21 0156 10/26/21 0721 10/26/21 1131  GLUCAP 90 92 94 93 108*   Lipid Profile: No results for input(s): "CHOL", "HDL", "LDLCALC", "TRIG", "CHOLHDL", "LDLDIRECT" in the last 72 hours. Thyroid Function Tests: Recent Labs    10/25/21 1403  TSH 1.784   Anemia Panel: No results for input(s): "VITAMINB12", "FOLATE", "FERRITIN", "TIBC", "IRON", "RETICCTPCT" in the last 72 hours. Sepsis Labs: No results for input(s): "PROCALCITON", "LATICACIDVEN" in the last 168 hours.  Recent Results (from the past 240 hour(s))  SARS Coronavirus 2 by RT PCR (hospital order, performed in [facility] hospital lab) *cepheid single result test* Anterior Nasal Swab     Status: None   Collection Time: 10/25/21  4:11 PM   Specimen: Anterior Nasal Swab  Result Value Ref Range Status   SARS Coronavirus 2 by RT PCR NEGATIVE NEGATIVE Final    Comment: (NOTE) SARS-CoV-2 target nucleic acids are NOT DETECTED.  The SARS-CoV-2 RNA  is generally detectable in upper and lower respiratory specimens during the acute phase of infection. The lowest concentration of SARS-CoV-2 viral copies this assay can detect is 250 copies / mL. A negative result does not preclude SARS-CoV-2 infection and should not be used as the sole basis for treatment or other patient management decisions.  A negative result may occur with improper specimen collection / handling, submission of specimen other than nasopharyngeal swab, presence of viral mutation(s) within the areas targeted by this assay, and inadequate number of viral  copies (<250 copies / mL). A negative result must be combined with clinical observations, patient history, and epidemiological information.  Fact Sheet for Patients:   https://www.patel.info/  Fact Sheet for Healthcare Providers: https://hall.com/  This test is not yet approved or  cleared by the [place] FDA and has been authorized for detection and/or diagnosis of SARS-CoV-2 by FDA under an Emergency Use Authorization (EUA).  This EUA will remain in effect (meaning this test can be used) for the duration of the COVID-19 declaration under Section 564(b)(1) of the Act, 21 U.S.C. section 360bbb-3(b)(1), unless the authorization is terminated or revoked sooner.  Performed at [facility], [address] [place], [place] [address]     Radiology Studies: DG Chest 2 View  Result Date: 10/25/2021 CLINICAL DATA:  Weakness, atrial fibrillation, hypotension EXAM: CHEST - 2 VIEW COMPARISON:  03/13/2019 FINDINGS: Cardiomegaly. Both lungs are clear. The visualized skeletal structures are unremarkable. IMPRESSION: Cardiomegaly without acute abnormality of the lungs. Electronically Signed   By: Delanna Ahmadi M.D.   On: 10/25/2021 15:54    Scheduled Meds:  cyanocobalamin  1,000 mcg Oral Daily   dextrose  1 ampule Intravenous Once   enoxaparin (LOVENOX) injection  40 mg Subcutaneous Q24H   finasteride  5 mg Oral Daily   lidocaine  1 patch Transdermal Q24H   melatonin  5 mg Oral QHS   metoprolol succinate  25 mg Oral Daily   mirtazapine  7.5 mg Oral QHS   multivitamin with minerals  1 tablet Oral Daily   sertraline  25 mg Oral Daily   tiZANidine  2 mg Oral QHS   Continuous Infusions:  dextrose 30 mL/hr at 10/26/21 0315     LOS: 0 days    Time spent: 61 mins    Marqueta Pulley, MD [facility]   If 7PM-7AM, please contact night-coverage

## 2021-10-26 NOTE — Progress Notes (Signed)
Spoke with the NP D[facility] about patient's condition.  Discussed findings of PVCs in bigeminy pattern on EKG and telemetry monitoring without findings of significant bradycardia or sinus pauses thus far during his hospitalization.  Explained that his labs were significant for hyperkalemia with a potassium of 6.2 he was given lokelma, calcium gluconate, and IV fluids.  Since which time his hyperkalemia has resolved since being treated for dehydration as well.  She was also advised that according to hospitalist note his tentative discharge back to the facility is pending for tomorrow.  She was concerned about his H&H and was given recent blood counts as well as advised that we would be doing a CBC and a BMP in the morning prior to any discharge arrangements.

## 2021-10-26 NOTE — Progress Notes (Signed)
[employer] - Nurse informed me patient was given treatment for hyperkalemia just prior to repeat labs showing potassium was 3.5.  Several attempts with venipuncture were required to obtain additional blood for confirmation of results.  Additionally, patient with hypoglycemic events post treatment  Action Confirmatory potassium results showed K at 3.1. 40 meq of oral potassium supplementation ordered. D50 for hypoglycemic events x3 and D10 infusion initiated  Response - CBG stable in 90's. CBG monitoring every 4h

## 2021-10-27 ENCOUNTER — Ambulatory Visit: Payer: Medicare Other | Admitting: Cardiology

## 2021-10-27 DIAGNOSIS — E875 Hyperkalemia: Secondary | ICD-10-CM | POA: Diagnosis not present

## 2021-10-27 LAB — GLUCOSE, CAPILLARY
Glucose-Capillary: 104 mg/dL — ABNORMAL HIGH (ref 70–99)
Glucose-Capillary: 88 mg/dL (ref 70–99)
Glucose-Capillary: 92 mg/dL (ref 70–99)
Glucose-Capillary: 98 mg/dL (ref 70–99)

## 2021-10-27 LAB — BASIC METABOLIC PANEL
Anion gap: 4 — ABNORMAL LOW (ref 5–15)
BUN: 14 mg/dL (ref 8–23)
CO2: 21 mmol/L — ABNORMAL LOW (ref 22–32)
Calcium: 8.2 mg/dL — ABNORMAL LOW (ref 8.9–10.3)
Chloride: 113 mmol/L — ABNORMAL HIGH (ref 98–111)
Creatinine, Ser: 1.38 mg/dL — ABNORMAL HIGH (ref 0.61–1.24)
GFR, Estimated: 54 mL/min — ABNORMAL LOW (ref >60)
Glucose, Bld: 92 mg/dL (ref 70–99)
Potassium: 3.8 mmol/L (ref 3.5–5.1)
Sodium: 138 mmol/L (ref 135–145)

## 2021-10-27 LAB — CBC
HCT: 27.7 % — ABNORMAL LOW (ref 39.0–52.0)
Hemoglobin: 9.1 g/dL — ABNORMAL LOW (ref 13.0–17.0)
MCH: 33.2 pg (ref 26.0–34.0)
MCHC: 32.9 g/dL (ref 30.0–36.0)
MCV: 101.1 fL — ABNORMAL HIGH (ref 80.0–100.0)
Platelets: 365 10*3/uL (ref 150–400)
RBC: 2.74 MIL/uL — ABNORMAL LOW (ref 4.22–5.81)
RDW: 18.8 % — ABNORMAL HIGH (ref 11.5–15.5)
WBC: 7.2 10*3/uL (ref 4.0–10.5)
nRBC: 0 % (ref 0.0–0.2)

## 2021-10-27 MED ORDER — ORAL CARE MOUTH RINSE
15.0000 mL | OROMUCOSAL | Status: DC | PRN
Start: 2021-10-27 — End: 2021-10-27

## 2021-10-27 NOTE — TOC Transition Note (Signed)
Transition of Care [facility]) - CM/SW Discharge Note   Patient Details  Name: Philip Gordon MRN: [id number] Date of Birth: 09/22/1950  [facility] of [facility] CM/SW Contact:  Alberteen Sam, LCSW Phone Number: 10/27/2021, 10:43 AM   Clinical Narrative:     Patient will DC FY:TWKM Anticipated DC date:10/27/21 Transport QK:MMNOT  Per MD patient ready for [facility] RN, patient, patient's family, and facility notified of DC. Discharge Summary sent to facility. RN given number for report  [phone]. DC packet on chart. Ambulance transport requested for patient.  CSW signing off.  Pricilla Riffle, LCSW    Final next level of care: Skilled Nursing Facility Barriers to Discharge: No Barriers Identified   Patient Goals and CMS Choice Patient states their goals for this hospitalization and ongoing recovery are:: to go home CMS Medicare.gov Compare Post Acute Care list provided to:: Patient Choice offered to / list presented to : Patient  Discharge Placement              Patient chooses bed at: [facility] Patient to be transferred to facility by: [facility]   Patient and family notified of of transfer: 10/27/21  Discharge Plan and Services                                     Social Determinants of Health (SDOH) Interventions     Readmission Risk Interventions     No data to display

## 2021-10-27 NOTE — Discharge Instructions (Signed)
Advised to follow-up with primary care physician in 1 week. Patient is being discharged back to [facility] for rehabilitation. Advised to continue metoprolol 25 mg daily for heart rate control. Advised to continue current medications as prescribed.

## 2021-10-27 NOTE — Discharge Summary (Signed)
Physician Discharge Summary  Philip Gordon LOV:[id number] DOB: 08-Jun-1950 DOA: 10/25/2021  PCP: Rica Koyanagi, MD  Admit date: 10/25/2021  Discharge date: 10/27/2021  Admitted From: [facility] Disposition:  [facility]  Recommendations for Outpatient Follow-up:  Follow up with PCP in 1-2 weeks. Please obtain BMP/CBC in one week. Patient is being discharged back to [facility] for rehabilitation. Advised to continue metoprolol 25 mg daily for heart rate control. Advised to continue current medications as prescribed.  Home Health:None Equipment/Devices:None  Discharge Condition: Good CODE STATUS:Full code Diet recommendation: Heart Healthy  Brief [facility] Course: This 71 years old male with PMH significant for multiple sclerosis, hypertension, history of prostate cancer, depression who was sent in the ED from [facility] for further evaluation of bradycardia.  Patient was noted to have heart rate in the low 20s.  His metoprolol was recently discontinued due to bradycardia. Chart review showed that patient was seen recently by EP for the evaluation of nonsustained V. tach in 2/23. Per EP physician's note due to patient's multiple medical comorbidities he did not recommend aggressive medical suppression of patient's PVCs.  Patient reports generalized weakness but denies any chest pain, dizziness, lightheadedness, palpitations, shortness of breath or other symptoms.  Patient fell 2 nights ago while trying to pick up his urinal but denies feeling dizzy or lightheaded.  Twelve-lead EKG shows sinus rhythm with ventricular bigeminy. Labs significant for hyperkalemia,Potassium of 6.2.  Patient was given Lokelma, calcium gluconate,  IV fluids and was admitted for further evaluation. Cardiology is consulted.  As per cardiologist patient does not have bradycardia.  Heart rate was checked while on pulse oximetry.  Patient was resumed back on metoprolol.  Patient continues to get  better.  Potassium level normalized.  Patient became hypoglycemic overnight could be due to insulin given to correct hyperkalemia.  Patient was placed on dextrose water, blood glucose remains normal afterwards.  Patient feels better and wants to be discharged.  Patient is discharged back to [facility] for rehabilitation.  Patient will follow-up with cardiology and PCP as scheduled.   Discharge Diagnoses:  Principal Problem:   Hyperkalemia Active Problems:   Sinus bradycardia   Hypertension   History of prostate cancer   Depression    Hyperkalemia: > Improved. Patient presented in the ED with generalized weakness and bradycardia and found to have potassium level of 6.2. TSH within normal limits.  Unclear etiology of patient's hyperkalemia. Patient has received IV calcium gluconate,  dextrose, insulin, Lokelma. Repeat potassium level normalized.  Hyperkalemia resolved.  Sinus bradycardia: History of ventricular tachycardia: Likely multifactorial and related to the beta-blocker use as well as hyperkalemia.   EKG shows ventricular bigeminy. Patient was taking metoprolol which has been recently discontinued. Heart rate has improved with correction of potassium. Cardiology is consulted, patient does not have bradycardia. Patient's heart rate was measured at facility with a pulse oximeter, portable pulse oximeter was measuring every other beat. Metoprolol resumed and continue daily. Patient was previously evaluated by EP as an outpatient, aggressive medical suppression of PVC not recommended due to multiple comorbid conditions. Echocardiogram shows normal LVEF.  Patient feels better and wants to be discharged. Patient is being discharged back to [facility] for rehab.   Depression: Remains stable, continue Zoloft and Remeron.   History of prostate cancer: Stable.  Continue outpatient follow-up   Essential hypertension: Continue Toprol-XL 25 mg daily.   Generalized  weakness: PT and OT evaluation. Patient does have multiple sclerosis.   Patient will  go back to [facility] once medically stable.   Hypoglycemia: Patient has hypoglycemic episodes twice last night and started on low-dose dextrose water.   This could be likely due to insulin given to correct hyperkalemia. Continue to monitor blood glucose.  Blood glucose normalized.    Discharge Instructions  Discharge Instructions     Call MD for:  difficulty breathing, headache or visual disturbances   Complete by: As directed    Call MD for:  persistant dizziness or light-headedness   Complete by: As directed    Call MD for:  persistant nausea and vomiting   Complete by: As directed    Diet - low sodium heart healthy   Complete by: As directed    Diet Carb Modified   Complete by: As directed    Discharge instructions   Complete by: As directed    Advised to follow-up with primary care physician in 1 week. Patient is being discharged back to [facility] for rehabilitation. Advised to continue metoprolol 25 mg daily for heart rate control. Advised to continue current medications as prescribed.   Discharge wound care:   Complete by: As directed    Wound care at SNF   Increase activity slowly   Complete by: As directed       Allergies as of 10/27/2021       Reactions   Gabapentin    Other reaction(s): Dizziness   Pregabalin    Other reaction(s): Dizziness        Medication List     STOP taking these medications    acidophilus Caps capsule   Cholecalciferol 250 MCG (10000 UT) Tabs   ezetimibe 10 MG tablet Commonly known as: ZETIA   ferrous sulfate 325 (65 FE) MG tablet       TAKE these medications    acetaminophen 325 MG tablet Commonly known as: TYLENOL Take by mouth every 6 (six) hours as needed. As needed for pain   cetirizine 10 MG tablet Commonly known as: ZYRTEC Take 10 mg by mouth daily.   cyanocobalamin 1000 MCG tablet Commonly known as: VITAMIN  B12 Take 1 tablet (1,000 mcg total) by mouth daily.   fenofibrate 54 MG tablet Take 54 mg by mouth daily.   finasteride 5 MG tablet Commonly known as: PROSCAR Take 1 tablet (5 mg total) by mouth daily.   fluticasone 50 MCG/ACT nasal spray Commonly known as: FLONASE Place 2 sprays into both nostrils daily.   lidocaine 5 % Commonly known as: LIDODERM Place 1 patch onto the skin daily. Remove & Discard patch within 12 hours or as directed by MD   melatonin 5 MG Tabs Take 5 mg by mouth at bedtime.   metoprolol succinate 25 MG 24 hr tablet Commonly known as: Toprol XL Take 1 tablet (25 mg total) by mouth daily. What changed: how much to take   mirtazapine 7.5 MG tablet Commonly known as: REMERON Take 7.5 mg by mouth at bedtime.   multivitamin capsule Take 1 capsule by mouth daily.   mupirocin ointment 2 % Commonly known as: BACTROBAN Apply 1 Application topically 3 (three) times daily.   sertraline 25 MG tablet Commonly known as: ZOLOFT Take 1 tablet (25 mg total) by mouth daily.   Systane 0.4-0.3 % Soln Generic drug: Polyethyl Glycol-Propyl Glycol Apply 1 drop to eye in the morning and at bedtime. In both eyes (OU) as needed for dry eyes.   tiZANidine 2 MG tablet Commonly known as: ZANAFLEX Take 2 mg by mouth  at bedtime.   traMADol 50 MG tablet Commonly known as: ULTRAM Take 50 mg by mouth every 6 (six) hours as needed for moderate pain. What changed: Another medication with the same name was removed. Continue taking this medication, and follow the directions you see here.               Discharge Care Instructions  (From admission, onward)           Start     Ordered   10/27/21 0000  Discharge wound care:       Comments: Wound care at [facility]   10/27/21 1032            Follow-up Information     Rica Koyanagi, MD Follow up in 1 week(s).   Specialty: Geriatric Medicine Contact information: [place]  [address] [phone]         Kate Sable, MD .   Specialties: Cardiology, Radiology Contact information: [place] [address] [phone]                Allergies  Allergen Reactions   Gabapentin     Other reaction(s): Dizziness   Pregabalin     Other reaction(s): Dizziness    Consultations: Cardiology   Procedures/Studies: DG Chest 2 View  Result Date: 10/25/2021 CLINICAL DATA:  Weakness, atrial fibrillation, hypotension EXAM: CHEST - 2 VIEW COMPARISON:  03/13/2019 FINDINGS: Cardiomegaly. Both lungs are clear. The visualized skeletal structures are unremarkable. IMPRESSION: Cardiomegaly without acute abnormality of the lungs. Electronically Signed   By: Delanna Ahmadi M.D.   On: 10/25/2021 15:54      Subjective: Patient was seen and examined at bedside.  Overnight events noted.  Patient reports feeling better. Heart rate remains controlled, blood sugar remains improved.  Patient is being discharged to [facility].  Discharge Exam: Vitals:   10/27/21 0400 10/27/21 0800  BP: (!) 110/49 101/76  Pulse: 65   Resp: 17 16  Temp: 97.6 F (36.4 C) 97.9 F (36.6 C)  SpO2: 100% 98%   Vitals:   10/26/21 1950 10/27/21 0005 10/27/21 0400 10/27/21 0800  BP: (!) 107/45 (!) 104/50 (!) 110/49 101/76  Pulse: 62 67 65   Resp: '18 18 17 16  '$ Temp: (!) 97.4 F (36.3 C) (!) 97.5 F (36.4 C) 97.6 F (36.4 C) 97.9 F (36.6 C)  TempSrc: Oral Oral Oral Oral  SpO2: 99% 99% 100% 98%  Weight:      Height:        General: Pt is alert, awake, not in acute distress Cardiovascular: RRR, S1/S2 +, no rubs, no gallops Respiratory: CTA bilaterally, no wheezing, no rhonchi Abdominal: Soft, NT, ND, bowel sounds + Extremities: no edema, no cyanosis    The results of significant diagnostics from this hospitalization (including imaging, microbiology, ancillary and laboratory) are listed below for reference.     Microbiology: Recent Results (from the  past 240 hour(s))  SARS Coronavirus 2 by RT PCR (hospital order, performed in [facility] hospital lab) *cepheid single result test* Anterior Nasal Swab     Status: None   Collection Time: 10/25/21  4:11 PM   Specimen: Anterior Nasal Swab  Result Value Ref Range Status   SARS Coronavirus 2 by RT PCR NEGATIVE NEGATIVE Final    Comment: (NOTE) SARS-CoV-2 target nucleic acids are NOT DETECTED.  The SARS-CoV-2 RNA is generally detectable in upper and lower respiratory specimens during the acute phase of infection. The lowest concentration of  SARS-CoV-2 viral copies this assay can detect is 250 copies / mL. A negative result does not preclude SARS-CoV-2 infection and should not be used as the sole basis for treatment or other patient management decisions.  A negative result may occur with improper specimen collection / handling, submission of specimen other than nasopharyngeal swab, presence of viral mutation(s) within the areas targeted by this assay, and inadequate number of viral copies (<250 copies / mL). A negative result must be combined with clinical observations, patient history, and epidemiological information.  Fact Sheet for Patients:   https://www.patel.info/  Fact Sheet for Healthcare Providers: https://hall.com/  This test is not yet approved or  cleared by the [place] FDA and has been authorized for detection and/or diagnosis of SARS-CoV-2 by FDA under an Emergency Use Authorization (EUA).  This EUA will remain in effect (meaning this test can be used) for the duration of the COVID-19 declaration under Section 564(b)(1) of the Act, 21 U.S.C. section 360bbb-3(b)(1), unless the authorization is terminated or revoked sooner.  Performed at [facility], [address] [place], [place] [address]      Labs: BNP (last 3 results) Recent Labs    10/25/21 1611  BNP 324.4*   Basic Metabolic Panel: Recent Labs   Lab 10/25/21 1403 10/25/21 1949 10/25/21 2252 10/26/21 0457 10/27/21 0633  NA 137 139 137 137 138  K 6.2* 3.5 3.1* 3.9 3.8  CL 109 109 109 112* 113*  CO2 '22 23 22 '$ 21* 21*  GLUCOSE 83 91 117* 95 92  BUN '20 18 16 15 14  '$ CREATININE 1.31* 1.31* 1.28* 1.27* 1.38*  CALCIUM 9.1 8.7* 8.2* 8.3* 8.2*  MG 2.1  --   --   --   --    Liver Function Tests: Recent Labs  Lab 10/25/21 1403 10/25/21 2252  AST 23 20  ALT 14 11  ALKPHOS 95 76  BILITOT 2.6* 1.4*  PROT 7.1 5.3*  ALBUMIN 3.9 2.9*   No results for input(s): "LIPASE", "AMYLASE" in the last 168 hours. No results for input(s): "AMMONIA" in the last 168 hours. CBC: Recent Labs  Lab 10/25/21 1403 10/26/21 0643 10/27/21 0417  WBC 9.3 7.3 7.2  NEUTROABS 6.2  --   --   HGB 11.8* 9.2* 9.1*  HCT RESULTS UNAVAILABLE DUE TO INTERFERING SUBSTANCE 27.2* 27.7*  MCV RESULTS UNAVAILABLE DUE TO INTERFERING SUBSTANCE 99.3 101.1*  PLT 339 340 365   Cardiac Enzymes: No results for input(s): "CKTOTAL", "CKMB", "CKMBINDEX", "TROPONINI" in the last 168 hours. BNP: Invalid input(s): "POCBNP" CBG: Recent Labs  Lab 10/26/21 1549 10/26/21 2023 10/27/21 0008 10/27/21 0404 10/27/21 0739  GLUCAP 98 131* 104* 98 88   D-Dimer No results for input(s): "DDIMER" in the last 72 hours. Hgb A1c No results for input(s): "HGBA1C" in the last 72 hours. Lipid Profile No results for input(s): "CHOL", "HDL", "LDLCALC", "TRIG", "CHOLHDL", "LDLDIRECT" in the last 72 hours. Thyroid function studies Recent Labs    10/25/21 1403  TSH 1.784   Anemia work up No results for input(s): "VITAMINB12", "FOLATE", "FERRITIN", "TIBC", "IRON", "RETICCTPCT" in the last 72 hours. Urinalysis    Component Value Date/Time   COLORURINE YELLOW (A) 10/25/2021 1554   APPEARANCEUR HAZY (A) 10/25/2021 1554   APPEARANCEUR Cloudy 10/09/2011 1508   LABSPEC 1.019 10/25/2021 1554   LABSPEC 1.016 10/09/2011 1508   PHURINE 6.0 10/25/2021 1554   GLUCOSEU NEGATIVE  10/25/2021 1554   GLUCOSEU Negative 10/09/2011 1508   HGBUR NEGATIVE 10/25/2021 1554   BILIRUBINUR  NEGATIVE 10/25/2021 1554   BILIRUBINUR Negative 10/09/2011 1508   KETONESUR NEGATIVE 10/25/2021 1554   PROTEINUR NEGATIVE 10/25/2021 1554   NITRITE NEGATIVE 10/25/2021 1554   LEUKOCYTESUR NEGATIVE 10/25/2021 1554   LEUKOCYTESUR Negative 10/09/2011 1508   Sepsis Labs Recent Labs  Lab 10/25/21 1403 10/26/21 0643 10/27/21 0417  WBC 9.3 7.3 7.2   Microbiology Recent Results (from the past 240 hour(s))  SARS Coronavirus 2 by RT PCR (hospital order, performed in [facility] hospital lab) *cepheid single result test* Anterior Nasal Swab     Status: None   Collection Time: 10/25/21  4:11 PM   Specimen: Anterior Nasal Swab  Result Value Ref Range Status   SARS Coronavirus 2 by RT PCR NEGATIVE NEGATIVE Final    Comment: (NOTE) SARS-CoV-2 target nucleic acids are NOT DETECTED.  The SARS-CoV-2 RNA is generally detectable in upper and lower respiratory specimens during the acute phase of infection. The lowest concentration of SARS-CoV-2 viral copies this assay can detect is 250 copies / mL. A negative result does not preclude SARS-CoV-2 infection and should not be used as the sole basis for treatment or other patient management decisions.  A negative result may occur with improper specimen collection / handling, submission of specimen other than nasopharyngeal swab, presence of viral mutation(s) within the areas targeted by this assay, and inadequate number of viral copies (<250 copies / mL). A negative result must be combined with clinical observations, patient history, and epidemiological information.  Fact Sheet for Patients:   https://www.patel.info/  Fact Sheet for Healthcare Providers: https://hall.com/  This test is not yet approved or  cleared by the [place] FDA and has been authorized for detection and/or diagnosis of SARS-CoV-2  by FDA under an Emergency Use Authorization (EUA).  This EUA will remain in effect (meaning this test can be used) for the duration of the COVID-19 declaration under Section 564(b)(1) of the Act, 21 U.S.C. section 360bbb-3(b)(1), unless the authorization is terminated or revoked sooner.  Performed at [facility], [address], [place], [place] [address]      Time coordinating discharge: Over 30 minutes  SIGNED:   Shawna Clamp, MD  [facility] 10/27/2021, 10:33 AM Pager   If 7PM-7AM, please contact night-coverage

## 2021-10-27 NOTE — TOC Transition Note (Signed)
Transition of Care [facility]) - CM/SW Discharge Note   Patient Details  Name: Philip Gordon MRN: [id number] Date of Birth: 10/02/1950  [facility] CM/SW Contact:  Laurena Slimmer, RN Phone Number: 10/27/2021, 12:28 PM   Clinical Narrative:    Spoke with [facility] at [facility]. Patient is able to return to facility. EMS arranged. Discharge summary and transfer report sent in hub. Nurse and MD notified.   Final next level of care: Skilled Nursing Facility Barriers to Discharge: No Barriers Identified   Patient Goals and CMS Choice Patient states their goals for this hospitalization and ongoing recovery are:: to go home CMS Medicare.gov Compare Post Acute Care list provided to:: Patient Choice offered to / list presented to : Patient  Discharge Placement              Patient chooses bed at: [facility] Patient to be transferred to facility by: [facility]   Patient and family notified of of transfer: 10/27/21  Discharge Plan and Services                                     Social Determinants of Health (SDOH) Interventions     Readmission Risk Interventions     No data to display

## 2021-10-30 ENCOUNTER — Encounter: Payer: Self-pay | Admitting: Cardiology

## 2021-10-30 ENCOUNTER — Ambulatory Visit: Payer: Medicare Other | Admitting: Cardiology

## 2021-11-17 ENCOUNTER — Non-Acute Institutional Stay: Payer: Medicare Other | Admitting: Primary Care

## 2021-11-24 ENCOUNTER — Non-Acute Institutional Stay: Payer: Medicare Other | Admitting: Primary Care

## 2021-11-24 DIAGNOSIS — G35 Multiple sclerosis: Secondary | ICD-10-CM

## 2021-11-24 DIAGNOSIS — G8929 Other chronic pain: Secondary | ICD-10-CM

## 2021-11-24 DIAGNOSIS — R531 Weakness: Secondary | ICD-10-CM

## 2021-11-24 DIAGNOSIS — Z515 Encounter for palliative care: Secondary | ICD-10-CM

## 2021-11-24 NOTE — Progress Notes (Signed)
[facility] Palliative Care Consult Note Telephone: [phone]  Fax: [phone]    Date of encounter: 11/24/21 12:26 PM PATIENT NAME: Philip Gordon [id number]   [phone] (home)  DOB: 08-05-1950 MRN: [id number] PRIMARY CARE PROVIDER:    Rica Koyanagi, MD,  [place] [address] [phone]  REFERRING PROVIDER:   Rica Koyanagi, MD [address] [place],  [place] [address] [phone]  RESPONSIBLE PARTY:    Contact Information     Name Relation Home Work Philip Gordon Other [phone]     Philip Gordon [phone]     Philip Gordon Daughter [phone]          I met face to face with [facility] in [facility]. Palliative Care was asked to follow this patient by consultation request of  Philip Koyanagi, MD to address advance care planning and complex medical decision making. This is a follow up visit.                                   ASSESSMENT AND PLAN / RECOMMENDATIONS:   Advance Care Planning/Goals of Care: Goals include to maximize quality of life and symptom management. Patient/health care surrogate gave his/her permission to discuss.Our advance care planning conversation included a discussion about:     Exploration of personal, cultural or spiritual beliefs that might influence medical decisions  Identification of a healthcare agent - cousin CODE STATUS: DNR  Symptom Management/Plan:  I met with patient in his nursing home room. He appears to have less upper body strength and his voice is softer. This has been + finding for the last several months. He endorses wanting to have physical therapy to "get stronger." He also feels that he's not having therapy because of insurance reasons. I believe historically he has had periodic physical therapy , that may be experiencing disease progression of his MS. He's alert and oriented and denies pain but does report his decreasing  function is distressing   Follow up Palliative Care Visit: Palliative care will continue to follow for complex medical decision making, advance care planning, and clarification of goals. Return 10-12 weeks or prn.  I spent 25 minutes providing this consultation. More than 50% of the time in this consultation was spent in counseling and care coordination.  PPS: 40%  HOSPICE ELIGIBILITY/DIAGNOSIS: TBD  Chief Complaint: Weakness, chronic pain  HISTORY OF PRESENT ILLNESS:  Philip Gordon is a 71 y.o. year old male  with MS, chronic pain, weakness, fall risk . Patient seen today to review palliative care needs to include medical decision making and advance care planning as appropriate.   History obtained from review of EMR, discussion with primary team, and interview with family, facility staff/caregiver and/or Philip Gordon.  I reviewed available labs, medications, imaging, studies and related documents from the EMR.  Records reviewed and summarized above.   ROS  General: NAD ENMT: denies dysphagia Cardiovascular: denies chest pain, denies DOE Pulmonary: endorses cough, denies increased SOB Abdomen: endorses fair to good  appetite, denies constipation, endorses continence of bowel GU: denies dysuria, endorses continence of urine MSK:  endorses increased weakness,  no falls reported Skin: denies rashes or wounds Neurological: endorses back  pain, denies insomnia Psych: Endorses positive mood  Physical Exam: Current and past weights: 147 lbs, 7 lb loss in 4 months. Constitutional: NAD General: frail appearing, thin EYES: anicteric  sclera, lids intact, no discharge  ENMT: intact hearing, oral mucous membranes moist, dentition discolored due to chewing tobacco CV:  no LE edema Pulmonary:  no increased work of breathing, no cough, room air Abdomen: intake 50-70%, soft and non tender, no ascites MSK: + sarcopenia, moves all extremities, ambulatory with walker Skin: warm and dry, no  rashes or wounds on visible skin Neuro:  + generalized weakness, mild  cognitive impairment, anxious affect   Thank you for the opportunity to participate in the care of Philip Gordon.  The palliative care team will continue to follow. Please call our office at [phone] if we can be of additional assistance.   Jason Coop, NP DNP, AGPCNP-BC  COVID-19 PATIENT SCREENING TOOL Asked and negative response unless otherwise noted:   Have you had symptoms of covid, tested positive or been in contact with someone with symptoms/positive test in the past 5-10 days?

## 2022-02-02 ENCOUNTER — Non-Acute Institutional Stay: Payer: Medicare Other | Admitting: Primary Care

## 2022-02-02 DIAGNOSIS — G35 Multiple sclerosis: Secondary | ICD-10-CM

## 2022-02-02 DIAGNOSIS — R531 Weakness: Secondary | ICD-10-CM

## 2022-02-02 DIAGNOSIS — G8929 Other chronic pain: Secondary | ICD-10-CM

## 2022-02-02 DIAGNOSIS — Z515 Encounter for palliative care: Secondary | ICD-10-CM

## 2022-02-02 DIAGNOSIS — M549 Dorsalgia, unspecified: Secondary | ICD-10-CM

## 2022-02-02 NOTE — Progress Notes (Signed)
[facility] Consult Note Telephone: [phone]  Fax: [phone]    Date of encounter: 02/02/22 11:30 AM PATIENT NAME: Philip Gordon [id number]   [phone] (home)  DOB: [birth year] MRN: [id number] PRIMARY CARE PROVIDER:    Rica Koyanagi, MD,  [place] [address] [phone]  REFERRING PROVIDER:   Rica Koyanagi, MD [address] [place],  [place] [address] [phone]  RESPONSIBLE PARTY:    Contact Information     Name Relation Home Work [place] Other [phone]     Genia Del [phone]     Denard,Lindsay Daughter [phone]         [facility]. Palliative Care was asked to follow this patient by consultation request of  Rica Koyanagi, MD to address advance care planning and complex medical decision making. This is a follow up visit.                                   ASSESSMENT AND PLAN / RECOMMENDATIONS:   Advance Care Planning/Goals of Care: Goals include to maximize quality of life and symptom management. Patient/health care surrogate gave his/her permission to discuss. Our advance care planning conversation included a discussion about:    Exploration of personal, cultural or spiritual beliefs that might influence medical decisions  Identification of a healthcare agent - cousin CODE STATUS: dnr  Symptom Management/Plan:   Wound: Recent mrsa in wound. Finishing antibiotic.  Endorses he's feeling better with this infection.  Pain: Endorses chronic back pain, says he just lives with it now.   Intake: Appetite is usual, has lots of snacks in his room.   Tobacco use : Continues to use smokeless tobacco daily.    Mobility: Uses walker prn, has trouble with some fine motor adls. Has LE braces. Appears to be increasing in frailty. A and O at baseline.  Follow up Palliative Care Visit: Palliative care will  continue to follow for complex medical decision making, advance care planning, and clarification of goals. Return 6-8 weeks or prn.  I spent 25 minutes providing this consultation. More than 50% of the time in this consultation was spent in counseling and care coordination.  PPS: 40%  HOSPICE ELIGIBILITY/DIAGNOSIS: TBD  Chief Complaint: debility  HISTORY OF PRESENT ILLNESS:  [name] is a 71 y.o. year old male  with  debility, TUD, MS .   History obtained from review of EMR, discussion with primary team, and interview with family, facility staff/caregiver and/or Mr. Primeau.  I reviewed available labs, medications, imaging, studies and related documents from the EMR.  Records reviewed and summarized above.   ROS   General: NAD EYES: denies vision changes ENMT: denies dysphagia Cardiovascular: denies chest pain, denies DOE Pulmonary: denies cough, denies increased SOB Abdomen: endorses good appetite, denies constipation, endorses continence of bowel GU: denies dysuria, endorses continence of urine MSK:  denies increased weakness,  no falls reported Skin: denies rashes or wounds Neurological: endorses baseline shoulder  pain, denies insomnia Psych: Endorses positive mood Heme/lymph/immuno: denies bruises, abnormal bleeding  Physical Exam: Current and past weights: 150 lbs. Constitutional: NAD General: frail appearing, thin EYES: anicteric sclera, lids intact, no discharge  ENMT: intact hearing, oral mucous membranes moist, dentition intact, chronic tobacco use (oral) CV: no LE edema Pulmonary: no increased work of breathing,  no cough, room air Abdomen: intake 80%,  soft and non tender, no ascites GU: deferred MSK: + sarcopenia, moves all extremities, ambulatory with walker, LE braces Skin: warm and dry, no rashes or wounds on visible skin Neuro:  +generalized weakness,  no cognitive impairment Psych: non-anxious affect, A and O x 3 Hem/lymph/immuno: no  widespread bruising   Thank you for the opportunity to participate in the care of [name]. Please call our office at [phone] if we can be of additional assistance.   Jason Coop, NP   COVID-19 PATIENT SCREENING TOOL Asked and negative response unless otherwise noted:   Have you had symptoms of covid, tested positive or been in contact with someone with symptoms/positive test in the past 5-10 days?

## 2022-03-08 ENCOUNTER — Encounter: Payer: Self-pay | Admitting: Nurse Practitioner

## 2022-03-08 ENCOUNTER — Non-Acute Institutional Stay: Payer: Medicare Other | Admitting: Nurse Practitioner

## 2022-03-08 DIAGNOSIS — G35 Multiple sclerosis: Secondary | ICD-10-CM

## 2022-03-08 DIAGNOSIS — Z515 Encounter for palliative care: Secondary | ICD-10-CM

## 2022-03-08 DIAGNOSIS — R5381 Other malaise: Secondary | ICD-10-CM

## 2022-03-08 NOTE — Progress Notes (Addendum)
[facility] Consult Note Telephone: [phone]  Fax: [phone]    Date of encounter: 03/08/22 10:15 PM PATIENT NAME: Philip Gordon Philip Gordon   [phone] (home)  DOB: 1950/04/19 MRN: [id number] PRIMARY CARE PROVIDER:    [facility]  RESPONSIBLE PARTY:    Contact Information     Name Relation Home Work Mobile   Smith,[name] Other [phone]     Genia Del [place] Daughter [phone]         I met face to face with Philip Gordon at facilty. Palliative Care was asked to follow this patient by consultation request of  Rica Koyanagi, MD to address advance care planning and complex medical decision making. This is a follow up visit.                                  ASSESSMENT AND PLAN / RECOMMENDATIONS:  Symptom Management/Plan: 1. Advance Care Planning;  DNR 2. Goals of Care: Goals include to maximize quality of life and symptom management. Our advance care planning conversation included a discussion about:    The value and importance of advance care planning  Exploration of personal, cultural or spiritual beliefs that might influence medical decisions  Exploration of goals of care in the event of a sudden injury or illness  Identification and preparation of a healthcare agent  Review and updating or creation of an advance directive document. 3. Palliative care encounter; Palliative care encounter; Palliative medicine team will continue to support patient, patient's family, and medical team. Visit consisted of counseling and education dealing with the complex and emotionally intense issues of symptom management and palliative care in the setting of serious and potentially life-threatening illness   4. Pain: ongoing, discussed at length about current pain, pain regimen an encourage mobility.    5. Debility: secondary to MS, continue to encourage mobility, fall  risk, use braces. Encourage activity, socialization   Follow up Palliative Care Visit: Palliative care will continue to follow for complex medical decision making, advance care planning, and clarification of goals. Return 4-8 weeks or prn.   I spent 45 minutes providing this consultation. More than 50% of the time in this consultation was spent in counseling and care coordination. PPS: 40% HISTORY OF PRESENT ILLNESS:  Philip Gordon is a 71 y.o. year old male with multiple medical problems including multiple sclerosis disseminate sclerosis, brain lesion, h/o prostate cancer, HTN, disorder of blader, b12 deficiencies, anemia, chronic back pain, bilateral foot drop, depression. Philip Gordon resides at [facility], requires assistance with transfers though uses braces and walks with walker. Philip Gordon does require assistance with bathing, dressing, toileting. Philip Gordon does feed himself with declined appetite, he does not care for the food at the facility. Staff endorses no new changes or concerns. At present Philip Gordon is sitting on the side of the bed, appears comfortable. No visitors present. I visited and observed Philip Gordon, talked about purpose of pc visit, in agreement. We talked about how he has been feeling today, ros, his functional abilities, length of time he has been at the facility, quality of life, facility living, challenges he experiences residing at a facility. We talked about appetite, weight, food likes/dislikes. We talked about past medical history, last time he was independent, family and social history. Philip Gordon talked at length about his daughter  and 2 grandsons. We talked about medical goals, reviewed medications, goc, currently stable, continue to watch appetite, weights, encourage mobility. Therapeutic listening, emotional support provided. Questions answered. Updated staff, no new changes to poc. Philip Gordon shared he was thankful for the visit.    History  obtained from review of EMR, discussion with primary team, and interview with family, facility staff/caregiver and/or Philip. Gordon.  I reviewed available labs, medications, imaging, studies and related documents from the EMR.  Records reviewed and summarized above.    ROS 10 point system reviewed all negative except HPI   Physical Exam: Constitutional: NAD General: frail appearing, thin, pleasant male EYES:  lids intact ENMT: oral mucous membranes moist CV: RRR Pulmonary: Breath sounds clear Abdomen: soft and non tender MSK: moves all extremities, ambulatory with walker, LE braces Skin: warm and dry Neuro:  +generalized weakness,  no cognitive impairment Psych: non-anxious affect, A and O Thank you for the opportunity to participate in the care of Philip. Gordon. Please call our office at [phone] if we can be of additional assistance.   Waqas Bruhl Ihor Gully, NP

## 2022-04-30 ENCOUNTER — Non-Acute Institutional Stay: Payer: Medicare Other | Admitting: Nurse Practitioner

## 2022-04-30 ENCOUNTER — Encounter: Payer: Self-pay | Admitting: Nurse Practitioner

## 2022-04-30 DIAGNOSIS — R5381 Other malaise: Secondary | ICD-10-CM

## 2022-04-30 DIAGNOSIS — G35 Multiple sclerosis: Secondary | ICD-10-CM

## 2022-04-30 DIAGNOSIS — Z515 Encounter for palliative care: Secondary | ICD-10-CM

## 2022-04-30 NOTE — Progress Notes (Signed)
[facility] Consult Note Telephone: [phone]  Fax: [phone]    Date of encounter: 04/30/22 3:22 PM PATIENT NAME: JANN RA [place] [address]   [phone] (home)  DOB: 06-Nov-1950 MRN: [id number] PRIMARY CARE PROVIDER:    Rica Koyanagi, MD,  [place] [address] [phone]  REFERRING PROVIDER:   Rica Koyanagi, MD [address] [place],   [address] [phone]  RESPONSIBLE PARTY:    Contact Information     Name Relation Home Work [place] Other [phone]     Genia Del [place] Daughter [phone]       I met face to face with Mr Osterhout 72 at facilty. Palliative Care was asked to follow this patient by consultation request of  Rica Koyanagi, MD to address advance care planning and complex medical decision making. This is a follow up visit.                                  ASSESSMENT AND PLAN / RECOMMENDATIONS:  Symptom Management/Plan: 1. Advance Care Planning;  DNR 2. Goals of Care: Goals include to maximize quality of life and symptom management. Our advance care planning conversation included a discussion about:    The value and importance of advance care planning  Exploration of personal, cultural or spiritual beliefs that might influence medical decisions  Exploration of goals of care in the event of a sudden injury or illness  Identification and preparation of a healthcare agent  Review and updating or creation of an advance directive document. 3. Palliative care encounter; Palliative care encounter; Palliative medicine team will continue to support patient, patient's family, and medical team. Visit consisted of counseling and education dealing with the complex and emotionally intense issues of symptom management and palliative care in the setting of serious and potentially life-threatening illness   4. Pain: ongoing,  discussed at length about current pain, pain regimen an encourage mobility.    5. Debility: secondary to MS, continue to encourage mobility, fall risk, use braces. Encourage activity, socialization   Follow up Palliative Care Visit: Palliative care will continue to follow for complex medical decision making, advance care planning, and clarification of goals. Return 4-8 weeks or prn.   I spent 45 minutes providing this consultation started at 1:15 pm. More than 50% of the time in this consultation was spent in counseling and care coordination. PPS: 40% HISTORY OF PRESENT ILLNESS:  CHEICK SUHR is a 72 y.o. year old male with multiple medical problems including multiple sclerosis disseminate sclerosis, brain lesion, h/o prostate cancer, HTN, disorder of blader, b12 deficiencies, anemia, chronic back pain, bilateral foot drop, depression. Mr Legler resides at [facility], requires assistance with transfers though uses braces and walks with walker. Mr Hochstetler does require assistance with bathing, dressing, toileting. Mr Seyfried does feed himself with fair to declined per staff appetite. Staff endorses no new changes or concerns. Purpose of today PC f/u visit further discussion monitor trends of appetite, weights, monitor for functional, cognitive decline with chronic disease progression, assess any active symptoms, supportive role. At present Mr Ludlum is sitting in the chair in his room with walker in front of him, appears comfortable. No visitors present. I visited and observed Mr Ransome, talked about talked about ros, his functional abilities, appetite, weight, food likes/dislikes. We talked family, support  system, challenges residing at [facility] facility. Mr Galyean voice was very soft, low, difficult to hear or understand at times frequently asking him to repeat what he said which delayed visit. We talked about medical goals, reviewed medications, goc, currently stable. Therapeutic  listening, emotional support provided. Updated staff, no new changes to poc. PC f/u visit further discussion monitor trends of appetite, weights, monitor for functional, cognitive decline with chronic disease progression, assess any active symptoms, supportive role.   History obtained from review of EMR, discussion with primary team, and interview with family, facility staff/caregiver and/or Mr. Dearing.  I reviewed available labs, medications, imaging, studies and related documents from the EMR.  Records reviewed and summarized above.   Physical Exam: Constitutional: NAD General: frail appearing, thin, pleasant male EYES:  lids intact ENMT: oral mucous membranes moist CV: RRR Pulmonary: Breath sounds clear Abdomen: soft and non tender MSK: moves all extremities, ambulatory with walker, LE braces Skin: warm and dry Neuro:  +generalized weakness,  no cognitive impairment Psych: non-anxious affect, A and O  Thank you for the opportunity to participate in the care of Mr. Haub. Please call our office at [phone] if we can be of additional assistance.   Eulia Hatcher Ihor Gully, NP

## 2022-05-31 ENCOUNTER — Non-Acute Institutional Stay: Payer: Medicare Other | Admitting: Nurse Practitioner

## 2022-05-31 ENCOUNTER — Encounter: Payer: Self-pay | Admitting: Nurse Practitioner

## 2022-05-31 DIAGNOSIS — R5381 Other malaise: Secondary | ICD-10-CM

## 2022-05-31 DIAGNOSIS — G35 Multiple sclerosis: Secondary | ICD-10-CM

## 2022-05-31 DIAGNOSIS — Z515 Encounter for palliative care: Secondary | ICD-10-CM

## 2022-05-31 DIAGNOSIS — R531 Weakness: Secondary | ICD-10-CM

## 2022-05-31 NOTE — Progress Notes (Signed)
[facility] Consult Note Telephone: [phone]  Fax: [phone]    Date of encounter: 05/31/22 5:44 PM PATIENT NAME: Philip Gordon Philip Gordon [id number]   [phone] (home)  DOB: 02/18/1951 MRN: [id number] PRIMARY CARE PROVIDER:    [facility]  RESPONSIBLE PARTY:    Contact Information     Name Relation Home Work Mobile   Smith,[name] Other [phone]     Genia Del [place] Daughter [phone]       I met face to face with Philip Gordon at facilty. Palliative Care was asked to follow this patient by consultation request of  Rica Koyanagi, MD to address advance care planning and complex medical decision making. This is a follow up visit.                                  ASSESSMENT AND PLAN / RECOMMENDATIONS:  Symptom Management/Plan: 1. Advance Care Planning;  DNR 2. Palliative care encounter; Palliative care encounter; Palliative medicine team will continue to support patient, patient's family, and medical team. Visit consisted of counseling and education dealing with the complex and emotionally intense issues of symptom management and palliative care in the setting of serious and potentially life-threatening illness   3. Pain: ongoing, discussed at length about current pain, pain regimen an encourage mobility.    4. Debility: secondary to MS/generalized weakness, continue to encourage mobility, fall risk, use braces. Encourage activity, socialization. Discussed at length about progression of disease, revisiting PT/OT for increase weakness in BLE making it more difficult for ambulating, transfers.  05/28/2022 weight 145.2 lbs   Follow up Palliative Care Visit: Palliative care will continue to follow for complex medical decision making, advance care planning, and clarification of goals. Return 4-8 weeks or prn.   I spent 47 minutes providing this consultation. More than 50%  of the time in this consultation was spent in counseling and care coordination. PPS: 40% HISTORY OF PRESENT ILLNESS:  Philip Gordon is a 72 y.o. year old male with multiple medical problems including multiple sclerosis disseminate sclerosis, brain lesion, h/o prostate cancer, HTN, disorder of blader, b12 deficiencies, anemia, chronic back pain, bilateral foot drop, depression. Philip Gordon resides at [facility], requires assistance with transfers though uses braces and walks with walker. Philip Gordon does require assistance with bathing, dressing, toileting. Philip Gordon does feed himself with fair to declined per staff appetite. Staff endorses no new changes or concerns. Purpose of today PC f/u visit further discussion monitor trends of appetite, weights, monitor for functional, cognitive decline with chronic disease progression, assess any active symptoms, supportive role. At present Philip Gordon is sitting in the chair in his room in chair, appears comfortable. No visitors present. I visited and observed Philip Gordon, talked about talked about ros, his functional abilities, appetite, weight, food likes/dislikes. We talked at length about mobility, transfers, progression of chronic disease of MS cauging him more difficulty with ambulation, discussed revisiting PT/OT if insurance will allow. We talked extensively about MS, realistic expectations, importance of nutrition, independence as able, sleep. We talked about support with his family, We talked about medical goals, reviewed medications, goc, currently stable. Therapeutic listening, emotional support provided. Updated staff, no new changes to poc. PC f/u visit further discussion monitor trends of appetite, weights, monitor for functional, cognitive decline with chronic disease progression, assess any active symptoms,  supportive role.   History obtained from review of EMR, discussion with primary team, and interview with family, facility  staff/caregiver and/or Philip. Manning.  I reviewed available labs, medications, imaging, studies and related documents from the EMR.  Records reviewed and summarized above.    Physical Exam: General: frail appearing, thin, pleasant male, engaging ENMT: oral mucous membranes moist CV: RRR Pulmonary: Breath sounds clear MSK: moves all extremities, ambulatory with walker, LE braces Neuro:  +generalized weakness,  no cognitive impairment Psych: non-anxious affect, A and O Thank you for the opportunity to participate in the care of Philip. Gordon. Please call our office at [phone] if we can be of additional assistance.   Aldeen Riga Ihor Gully, NP

## 2022-07-06 ENCOUNTER — Encounter: Payer: Self-pay | Admitting: Cardiology

## 2022-07-18 ENCOUNTER — Non-Acute Institutional Stay: Payer: Medicare Other | Admitting: Nurse Practitioner

## 2022-07-18 DIAGNOSIS — G35 Multiple sclerosis: Secondary | ICD-10-CM

## 2022-07-18 DIAGNOSIS — Z515 Encounter for palliative care: Secondary | ICD-10-CM

## 2022-07-18 DIAGNOSIS — R630 Anorexia: Secondary | ICD-10-CM

## 2022-07-18 DIAGNOSIS — R5381 Other malaise: Secondary | ICD-10-CM

## 2022-07-18 NOTE — Progress Notes (Signed)
[facility] Consult Note Telephone: [phone]  Fax: [phone]    Date of encounter: 07/18/22 2:51 PM PATIENT NAME: Philip Gordon [address] [place] [address]   [phone] (home)  DOB: May 21, 1950 MRN: [id number] PRIMARY CARE PROVIDER:    [facility]  RESPONSIBLE PARTY:    Contact Information     Name Relation Home Work Mobile   Smith,[name] Other [phone]     Ivar Bury [phone]     Denard,Lindsay Daughter [phone]       I met face to face with Philip Gordon at facilty. Palliative Care was asked to follow this patient by consultation request of  Rosetta Posner, MD to address advance care planning and complex medical decision making. This is a follow up visit.                                  ASSESSMENT AND PLAN / RECOMMENDATIONS:  Symptom Management/Plan: 1. Advance Care Planning;  DNR 2. Palliative care encounter; Palliative care encounter; Palliative medicine team will continue to support patient, patient's family, and medical team. Visit consisted of counseling and education dealing with the complex and emotionally intense issues of symptom management and palliative care in the setting of serious and potentially life-threatening illness   3. Pain: ongoing, discussed at length about current pain, pain regimen an encourage mobility. Discussed chronic progression of MS in the setting of chronic disease   4. Debility: secondary to MS/generalized weakness, continue to encourage mobility, fall risk, use braces. Encourage activity, socialization.   5. Anorexia with slight weight loss, discussed appetite, nutrition, food at the facility, supplements, continue to monitor weights, encourage Philip Gordon to eat.  05/28/2022 weight 145.2 lbs 06/27/2022 weight 144.8 lbs Slight weight loss Follow up Palliative Care Visit: Palliative care will continue to follow for complex medical decision making, advance  care planning, and clarification of goals. Return 4-8 weeks or prn.   I spent 48 minutes providing this consultation. More than 50% of the time in this consultation was spent in counseling and care coordination. PPS: 40% HISTORY OF PRESENT ILLNESS:  [name] is a 72 y.o. year old male with multiple medical problems including multiple sclerosis disseminate sclerosis, brain lesion, h/o prostate cancer, HTN, disorder of blader, b12 deficiencies, anemia, chronic back pain, bilateral foot drop, depression. Philip Gordon resides at [facility], requires assistance with transfers though uses braces and walks with walker. Philip Gordon does require assistance with bathing, dressing, toileting. Philip Gordon does feed himself with fair to declined per staff appetite. Staff endorses no new changes or concerns. Purpose of today PC f/u visit further discussion monitor trends of appetite, weights, monitor for functional, cognitive decline with chronic disease progression, assess any active symptoms, supportive role. At present Philip Gordon is sitting in the chair in his room in chair, appears comfortable. No visitors present. We talked about purpose of pc visit, ros, functional abilities. We talked about ambulating with walker, what he is able to do with daily functions, no recent falls. We talked at length about ongoing chronic pain, currently comfortable in the setting of progressive MS. We talked about residing at facility, barriers, quality of life. We talked at length about appetite, weight, food at the facility, nutrition, supplements, choices of snacks. Therapeutic listening, emotional support provided. We talked about medical goals, reviewed medications, goc, currently stable. Updated staff, no new changes to  poc.   PC f/u visit further discussion monitor trends of appetite, weights, monitor for functional, cognitive decline with chronic disease progression, assess any active symptoms, supportive  role.   History obtained from review of EMR, discussion with primary team, and interview with family, facility staff/caregiver and/or Philip. Gordon.  I reviewed available labs, medications, imaging, studies and related documents from the EMR.  Records reviewed and summarized above.    Physical Exam: General: frail appearing, thin, pleasant male, engaging ENMT: oral mucous membranes moist CV: RRR Pulmonary: Breath sounds clear MSK: moves all extremities, ambulatory with walker, LE braces Neuro:  +generalized weakness,  no cognitive impairment Psych: non-anxious affect, A and O  Thank you for the opportunity to participate in the care of Philip Gordon. Please call our office at [phone] if we can be of additional assistance.   Marsena Taff Prince [place], NP

## 2022-09-03 ENCOUNTER — Non-Acute Institutional Stay: Payer: Medicare Other | Admitting: Nurse Practitioner

## 2022-09-03 ENCOUNTER — Encounter: Payer: Self-pay | Admitting: Nurse Practitioner

## 2022-09-03 DIAGNOSIS — R630 Anorexia: Secondary | ICD-10-CM

## 2022-09-03 DIAGNOSIS — Z515 Encounter for palliative care: Secondary | ICD-10-CM

## 2022-09-03 DIAGNOSIS — G35 Multiple sclerosis: Secondary | ICD-10-CM

## 2022-09-03 DIAGNOSIS — R5381 Other malaise: Secondary | ICD-10-CM

## 2022-09-03 NOTE — Progress Notes (Signed)
Therapist, nutritional Palliative Care Consult Note Telephone: [phone]  Fax: [phone]    Date of encounter: 09/03/22 3:44 PM PATIENT NAME: Philip Gordon [address] [place] [address]   [phone] (home)  DOB: 1951-01-15 MRN: [id number] PRIMARY CARE PROVIDER:    [facility]  RESPONSIBLE PARTY:    Contact Information     Name Relation Home Work Mobile   Smith,[name] Other [phone]     Ivar Bury [phone]     Denard,Lindsay Daughter [phone]       I met face to face with Philip Gordon at facilty. Palliative Care was asked to follow this patient by consultation request of  Rosetta Posner, MD to address advance care planning and complex medical decision making. This is a follow up visit.                                  ASSESSMENT AND PLAN / RECOMMENDATIONS:  Symptom Management/Plan: 1. Advance Care Planning;  DNR 2. Palliative care encounter; Palliative care encounter; Palliative medicine team will continue to support patient, patient's family, and medical team. Visit consisted of counseling and education dealing with the complex and emotionally intense issues of symptom management and palliative care in the setting of serious and potentially life-threatening illness   3. Pain: ongoing, discussed at length about current pain, pain regimen an encourage mobility. Discussed chronic progression of MS in the setting of chronic disease   4. Debility: secondary to MS/generalized weakness, continue to encourage mobility, fall risk, use braces. Encourage activity, socialization.    5. Anorexia with slight weight loss, discussed appetite, nutrition, food at the facility, supplements, continue to monitor weights, encourage Philip Gordon to eat.  05/28/2022 weight 145.2 lbs 06/27/2022 weight 144.8 lbs  Follow up Palliative Care Visit: Palliative care will continue to follow for complex medical decision making, advance care planning, and  clarification of goals. Return 2-8 weeks or prn.   I spent 45 minutes providing this consultation. More than 50% of the time in this consultation was spent in counseling and care coordination. PPS: 40% HISTORY OF PRESENT ILLNESS:  Philip Gordon is a 72 y.o. year old male with multiple medical problems including multiple sclerosis disseminate sclerosis, brain lesion, h/o prostate cancer, HTN, disorder of blader, b12 deficiencies, anemia, chronic back pain, bilateral foot drop, depression. Philip Gordon resides at [facility], requires assistance with transfers though uses braces and walks with walker. Philip Gordon does require assistance with bathing, dressing, toileting. Philip Gordon does feed himself with fair to declined per staff appetite. Staff endorses no new changes or concerns. Purpose of today PC f/u visit further discussion monitor trends of appetite, weights, monitor for functional, cognitive decline with chronic disease progression, assess any active symptoms, supportive role. At present Philip Gordon is sitting in the chair in his room in chair, appears comfortable. No visitors present. We talked about purpose of pc visit. We talked about ros, MS chronic disease with progression, expectations, functional overall decline. We talked about appetite, nutrition, foods he will eat. We reviewed weights, medications, goc, talked about his daughter and grandson's. Philip Gordon was talking about his roommate. Discussed coping strategies.  Therapeutic listening, emotional support provided. We talked about medical goals, reviewed medications, goc, currently stable. Updated staff, no new changes to poc.    PC f/u visit further discussion monitor trends of appetite, weights, monitor for functional, cognitive decline with  chronic disease progression, assess any active symptoms, supportive role.   History obtained from review of EMR, discussion with primary team, and interview with family, facility  staff/caregiver and/or Philip. Gordon.  I reviewed available labs, medications, imaging, studies and related documents from the EMR.  Records reviewed and summarized above.    Physical Exam: General: frail appearing, pleasant male ENMT: oral mucous membranes moist CV: RRR Pulmonary: Breath sounds clear, decreased bases MSK: ambulatory with walker, LE braces Neuro:  +generalized weakness,  no cognitive impairment Psych: non-anxious affect, A and O Thank you for the opportunity to participate in the care of Philip. Gordon. Please call our office at [phone] if we can be of additional assistance.   Jahiem Franzoni Prince [place], NP

## 2022-09-18 NOTE — Addendum Note (Signed)
Addended by: Ivery Quale on: 09/18/2022 02:39 PM   Modules accepted: Level of Service

## 2022-10-26 ENCOUNTER — Other Ambulatory Visit: Payer: Self-pay

## 2022-10-26 ENCOUNTER — Inpatient Hospital Stay
Admission: EM | Admit: 2022-10-26 | Discharge: 2022-10-30 | DRG: 808 | Disposition: A | Discharge status: Skilled Nursing Facility | Payer: Medicare Other | Source: Skilled Nursing Facility | Attending: Student | Admitting: Student

## 2022-10-26 ENCOUNTER — Encounter: Payer: Self-pay | Admitting: Internal Medicine

## 2022-10-26 DIAGNOSIS — I10 Essential (primary) hypertension: Secondary | ICD-10-CM | POA: Diagnosis present

## 2022-10-26 DIAGNOSIS — G35 Multiple sclerosis: Secondary | ICD-10-CM | POA: Diagnosis present

## 2022-10-26 DIAGNOSIS — D649 Anemia, unspecified: Secondary | ICD-10-CM | POA: Diagnosis not present

## 2022-10-26 DIAGNOSIS — I498 Other specified cardiac arrhythmias: Secondary | ICD-10-CM | POA: Diagnosis present

## 2022-10-26 DIAGNOSIS — Z79899 Other long term (current) drug therapy: Secondary | ICD-10-CM

## 2022-10-26 DIAGNOSIS — I472 Ventricular tachycardia, unspecified: Secondary | ICD-10-CM | POA: Diagnosis present

## 2022-10-26 DIAGNOSIS — Z888 Allergy status to other drugs, medicaments and biological substances status: Secondary | ICD-10-CM

## 2022-10-26 DIAGNOSIS — E876 Hypokalemia: Secondary | ICD-10-CM | POA: Diagnosis present

## 2022-10-26 DIAGNOSIS — I444 Left anterior fascicular block: Secondary | ICD-10-CM | POA: Diagnosis present

## 2022-10-26 DIAGNOSIS — D5912 Cold autoimmune hemolytic anemia: Principal | ICD-10-CM | POA: Diagnosis present

## 2022-10-26 DIAGNOSIS — Z8249 Family history of ischemic heart disease and other diseases of the circulatory system: Secondary | ICD-10-CM

## 2022-10-26 DIAGNOSIS — E7800 Pure hypercholesterolemia, unspecified: Secondary | ICD-10-CM | POA: Diagnosis present

## 2022-10-26 DIAGNOSIS — Z66 Do not resuscitate: Secondary | ICD-10-CM | POA: Diagnosis present

## 2022-10-26 DIAGNOSIS — R001 Bradycardia, unspecified: Secondary | ICD-10-CM | POA: Diagnosis not present

## 2022-10-26 DIAGNOSIS — Z833 Family history of diabetes mellitus: Secondary | ICD-10-CM

## 2022-10-26 DIAGNOSIS — Z823 Family history of stroke: Secondary | ICD-10-CM

## 2022-10-26 DIAGNOSIS — Z8546 Personal history of malignant neoplasm of prostate: Secondary | ICD-10-CM

## 2022-10-26 DIAGNOSIS — F32A Depression, unspecified: Secondary | ICD-10-CM | POA: Diagnosis present

## 2022-10-26 DIAGNOSIS — N179 Acute kidney failure, unspecified: Secondary | ICD-10-CM | POA: Diagnosis present

## 2022-10-26 DIAGNOSIS — R008 Other abnormalities of heart beat: Secondary | ICD-10-CM | POA: Diagnosis present

## 2022-10-26 DIAGNOSIS — R5381 Other malaise: Secondary | ICD-10-CM | POA: Diagnosis present

## 2022-10-26 DIAGNOSIS — Z806 Family history of leukemia: Secondary | ICD-10-CM

## 2022-10-26 DIAGNOSIS — I493 Ventricular premature depolarization: Secondary | ICD-10-CM

## 2022-10-26 DIAGNOSIS — I4719 Other supraventricular tachycardia: Secondary | ICD-10-CM | POA: Diagnosis not present

## 2022-10-26 DIAGNOSIS — L89223 Pressure ulcer of left hip, stage 3: Secondary | ICD-10-CM | POA: Diagnosis present

## 2022-10-26 DIAGNOSIS — I4729 Other ventricular tachycardia: Secondary | ICD-10-CM

## 2022-10-26 DIAGNOSIS — Z720 Tobacco use: Secondary | ICD-10-CM

## 2022-10-26 DIAGNOSIS — D589 Hereditary hemolytic anemia, unspecified: Secondary | ICD-10-CM | POA: Diagnosis present

## 2022-10-26 DIAGNOSIS — E875 Hyperkalemia: Secondary | ICD-10-CM | POA: Diagnosis not present

## 2022-10-26 LAB — CBC
HCT: 20.5 % — ABNORMAL LOW (ref 39.0–52.0)
Hemoglobin: 7.4 g/dL — ABNORMAL LOW (ref 13.0–17.0)
MCH: 34.3 pg — ABNORMAL HIGH (ref 26.0–34.0)
MCHC: 36.1 g/dL — ABNORMAL HIGH (ref 30.0–36.0)
MCV: 94.9 fL (ref 80.0–100.0)
Platelets: 436 10*3/uL — ABNORMAL HIGH (ref 150–400)
RBC: 2.16 MIL/uL — ABNORMAL LOW (ref 4.22–5.81)
RDW: 14.3 % (ref 11.5–15.5)
WBC: 9.9 10*3/uL (ref 4.0–10.5)
nRBC: 0.2 % (ref 0.0–0.2)

## 2022-10-26 LAB — TYPE AND SCREEN

## 2022-10-26 LAB — RETICULOCYTES
Immature Retic Fract: 18 % — ABNORMAL HIGH (ref 2.3–15.9)
RBC.: 0.36 MIL/uL — ABNORMAL LOW (ref 4.22–5.81)
Retic Count, Absolute: 12.4 10*3/uL — ABNORMAL LOW (ref 19.0–186.0)
Retic Ct Pct: 3.5 % — ABNORMAL HIGH (ref 0.4–3.1)

## 2022-10-26 LAB — COMPREHENSIVE METABOLIC PANEL
ALT: 32 U/L (ref 0–44)
AST: 34 U/L (ref 15–41)
Albumin: 3.8 g/dL (ref 3.5–5.0)
Alkaline Phosphatase: 87 U/L (ref 38–126)
Anion gap: 8 (ref 5–15)
BUN: 19 mg/dL (ref 8–23)
CO2: 25 mmol/L (ref 22–32)
Calcium: 8.7 mg/dL — ABNORMAL LOW (ref 8.9–10.3)
Chloride: 106 mmol/L (ref 98–111)
Creatinine, Ser: 1.26 mg/dL — ABNORMAL HIGH (ref 0.61–1.24)
GFR, Estimated: 60 mL/min — ABNORMAL LOW (ref >60)
Glucose, Bld: 96 mg/dL (ref 70–99)
Potassium: 2.6 mmol/L — CL (ref 3.5–5.1)
Sodium: 139 mmol/L (ref 135–145)
Total Bilirubin: 3.3 mg/dL — ABNORMAL HIGH (ref 0.3–1.2)
Total Protein: 6.5 g/dL (ref 6.5–8.1)

## 2022-10-26 LAB — LACTATE DEHYDROGENASE: LDH: 222 U/L — ABNORMAL HIGH (ref 98–192)

## 2022-10-26 LAB — PREPARE RBC (CROSSMATCH)

## 2022-10-26 MED ORDER — POTASSIUM CHLORIDE CRYS ER 20 MEQ PO TBCR
40.0000 meq | EXTENDED_RELEASE_TABLET | Freq: Once | ORAL | Status: AC
  Administered 2022-10-26: 40 meq via ORAL
  Filled 2022-10-26: qty 2

## 2022-10-26 MED ORDER — ACETAMINOPHEN 650 MG RE SUPP: 650.0000 mg | Freq: Four times a day (QID) | RECTAL | Status: DC | PRN

## 2022-10-26 MED ORDER — CHLORHEXIDINE GLUCONATE CLOTH 2 % EX PADS
6.0000 | MEDICATED_PAD | Freq: Every day | CUTANEOUS | Status: DC
  Administered 2022-10-26: 6 via TOPICAL

## 2022-10-26 MED ORDER — MORPHINE SULFATE (PF) 2 MG/ML IV SOLN: 2.0000 mg | INTRAVENOUS | Status: DC | PRN

## 2022-10-26 MED ORDER — POTASSIUM CHLORIDE 10 MEQ/100ML IV SOLN
10.0000 meq | INTRAVENOUS | Status: AC
  Administered 2022-10-26 (×2): 10 meq via INTRAVENOUS
  Filled 2022-10-26 (×2): qty 100

## 2022-10-26 MED ORDER — LACTATED RINGERS IV SOLN: INTRAVENOUS | Status: AC

## 2022-10-26 MED ORDER — HYDROCODONE-ACETAMINOPHEN 5-325 MG PO TABS: 1.0000 | ORAL_TABLET | ORAL | Status: DC | PRN

## 2022-10-26 MED ORDER — PREDNISONE 20 MG PO TABS
60.0000 mg | ORAL_TABLET | Freq: Every day | ORAL | Status: DC
  Administered 2022-10-26 – 2022-10-29 (×3): 60 mg via ORAL
  Filled 2022-10-26 (×4): qty 3

## 2022-10-26 MED ORDER — ACETAMINOPHEN 325 MG PO TABS: 650.0000 mg | ORAL_TABLET | Freq: Four times a day (QID) | ORAL | Status: DC | PRN

## 2022-10-26 MED ORDER — SODIUM CHLORIDE 0.9 % IV BOLUS
500.0000 mL | Freq: Once | INTRAVENOUS | Status: AC
  Administered 2022-10-26: 500 mL via INTRAVENOUS

## 2022-10-26 MED ORDER — SODIUM CHLORIDE 0.9 % IV SOLN: 10.0000 mL/h | Freq: Once | INTRAVENOUS | Status: DC

## 2022-10-26 MED ORDER — SODIUM CHLORIDE 0.9% FLUSH
3.0000 mL | Freq: Two times a day (BID) | INTRAVENOUS | Status: DC
  Administered 2022-10-26 – 2022-10-30 (×7): 3 mL via INTRAVENOUS

## 2022-10-26 NOTE — ED Triage Notes (Signed)
K: 2.6  awaiting Hgb/Hct results.  Patient remains AAOx3.  Skin warm and dry.  NAD

## 2022-10-26 NOTE — ED Provider Notes (Signed)
[facility] Provider Note    Event Date/Time   First MD Initiated Contact with Patient 10/26/22 1846     (approximate)   History   Anemia   HPI  Philip Gordon is a 72 y.o. male with a history of hypertension hypercholesterolemia sclerosis, cold agglutinin disease foot drop autoimmune hemolytic anemia and multiple sclerosis   Creasing fatigue and weakness for the last couple of days, labs concerning when checked at his care center for low hemoglobin  Patient reports she has had increased fatigue, just feels a bit weak.  No weakness in 1 particular area.  No stomach pains no black or bloody stool denies any bleeding  Physical Exam   Triage Vital Signs: ED Triage Vitals [10/26/22 1627]  Encounter Vitals Group     BP (!) 125/57     Systolic BP Percentile      Diastolic BP Percentile      Pulse Rate 71     Resp 18     Temp 98.5 F (36.9 C)     Temp Source Oral     SpO2 99 %     Weight 155 lb 3.3 oz (70.4 kg)     Height 5\' 9"  (1.753 m)     Head Circumference      [facility] Flow      Pain Score 0     Pain Loc      Pain Education      Exclude from Growth Chart     Most recent vital signs: Vitals:   10/26/22 1627 10/26/22 1900  BP: (!) 125/57 (!) 144/56  Pulse: 71 70  Resp: 18 18  Temp: 98.5 F (36.9 C) 98.4 F (36.9 C)  SpO2: 99% 100%     General: Awake, no distress.  CV:  Good peripheral perfusion.  Normal tones Resp:  Normal effort.  Clear bilateral Abd:  No distention.  Soft nontender nondistended, patient declines rectal examination Other:  Moves all extremities well no obvious weakness   ED Results / Procedures / Treatments   Labs (all labs ordered are listed, but only abnormal results are displayed) Labs Reviewed  CBC - Abnormal; Notable for the following components:      Result Value   RBC 2.16 (*)    Hemoglobin 7.4 (*)    HCT 20.5 (*)    MCH 34.3 (*)    MCHC 36.1 (*)    Platelets 436 (*)    All other components  within normal limits  COMPREHENSIVE METABOLIC PANEL - Abnormal; Notable for the following components:   Potassium 2.6 (*)    Creatinine, Ser 1.26 (*)    Calcium 8.7 (*)    Total Bilirubin 3.3 (*)    GFR, Estimated 60 (*)    All other components within normal limits  LACTATE DEHYDROGENASE - Abnormal; Notable for the following components:   LDH 222 (*)    All other components within normal limits  HAPTOGLOBIN  RETICULOCYTES  DIFFERENTIAL  TYPE AND SCREEN  PREPARE RBC (CROSSMATCH)  TYPE AND SCREEN  ABO/RH     EKG  And her by me at 1630 heart rate 65 QRS 100 QTc 430 Normal sinus rhythm no evidence of acute ischemia.  PVC present   RADIOLOGY     PROCEDURES:  Critical Care performed: Yes, see critical care procedure note(s)  CRITICAL CARE Performed by: Sharyn Creamer   Total critical care time: 30 minutes  Critical care time was exclusive of separately billable procedures and  treating other patients.  Critical care was necessary to treat or prevent imminent or life-threatening deterioration.  Critical care was time spent personally by me on the following activities: development of treatment plan with patient and/or surrogate as well as nursing, discussions with consultants, evaluation of patient's response to treatment, examination of patient, obtaining history from patient or surrogate, ordering and performing treatments and interventions, ordering and review of laboratory studies, ordering and review of radiographic studies, pulse oximetry and re-evaluation of patient's condition.   Procedures   MEDICATIONS ORDERED IN ED: Medications  potassium chloride 10 mEq in 100 mL IVPB (10 mEq Intravenous New Bag/Given 10/26/22 2034)  0.9 %  sodium chloride infusion (0 mL/hr Intravenous Hold 10/26/22 1928)  predniSONE (DELTASONE) tablet 60 mg (has no administration in time range)  sodium chloride 0.9 % bolus 500 mL (500 mLs Intravenous New Bag/Given 10/26/22 1926)  potassium chloride  SA (KLOR-CON M) CR tablet 40 mEq (40 mEq Oral Given 10/26/22 1943)     IMPRESSION / MDM / ASSESSMENT AND PLAN / ED COURSE  I reviewed the triage vital signs and the nursing notes.                              Differential diagnosis includes, but is not limited to, anemia, GI bleeding, anemia chronic disease, iron deficiency, underlying history of autoimmune hemolytic anemia, etc.  His platelet count is slightly elevated, no thrombocytopenia.  Renal function creatinine 1.26.  Significant hypokalemia which could be causing some of his symptoms we will replete this.  His hemoglobin is 7.4, not a severe anemia but he seems to be symptomatic to either this or potentially the hypokalemia component.  He is awake alert fully oriented.  His bilirubin slightly elevated   Patient's presentation is most consistent with acute presentation with potential threat to life or bodily function.   The patient is on the cardiac monitor to evaluate for evidence of arrhythmia and/or significant heart rate changes.  I called discussed case and Dr. Christain Sacramento, of hematology reviewed case advises appropriate to transfuse 1 unit of blood.  He does request additional studies which his team will follow-up on including haptoglobin, LDH and reticulocyte count.    Clinical Course as of 10/26/22 2057  Caleen Essex Oct 26, 2022  1910 Discussed prior history, today's labs with Dr. Yetta Numbers (hematology), advises ok to transfuse regular PRBCs. Can get regular blood. Reviewed notes by Dr. Smith Robert as well with Dr. Leonard Schwartz.  [MQ]    Clinical Course User Index [MQ] Sharyn Creamer, MD   Discussed case with Dr. Renaldo Reel, after consultation hospitalist service will provide admission.  They are understanding of involvement already by her hematology team, and we will follow-up on the pending test that we discussed.  It also appears hospitalist has ordered a differential   Patient verbally consents and agreeable to sign consent after discussing risks  benefits and alternatives to blood transfusion.  Patient agreeable with plan for admission  FINAL CLINICAL IMPRESSION(S) / ED DIAGNOSES   Final diagnoses:  Anemia, unspecified type  Hypokalemia     Rx / DC Orders   ED Discharge Orders     None        Note:  This document was prepared using Dragon voice recognition software and may include unintentional dictation errors.   Sharyn Creamer, MD 10/26/22 [id number]

## 2022-10-26 NOTE — ED Notes (Addendum)
Pt provided a sandwich tray and sprite. No other concerns at this time. Call bell within reach.

## 2022-10-26 NOTE — Plan of Care (Signed)

## 2022-10-26 NOTE — Assessment & Plan Note (Signed)
Continue sertraline and Remeron.

## 2022-10-26 NOTE — ED Triage Notes (Signed)
Pt here with a low hemoglobin, 6.5. Pt denies pain or any other symptoms.

## 2022-10-26 NOTE — ED Triage Notes (Signed)
Arrives from [facility] via [facility] for ED evaluation for low HGB.  Per report HGB: 6 or 7.  VS wnl. AOx4.  Spo2 97%  Patient only complaint is feeling 'a little sluggish'.

## 2022-10-26 NOTE — ED Notes (Addendum)
Verbal order by Dr. Allena Katz to place the patient on the bair hugger. Fluid warmer at the bedside. Per Dr. Allena Katz keep the blood no higher than 40 degrees celsius while transfusing.

## 2022-10-26 NOTE — Assessment & Plan Note (Addendum)
Monitor  on Telemetry.  Will correct potassium and mag pending if persistent will consider echo pt follows Dr. Azucena Cecil.

## 2022-10-26 NOTE — ED Notes (Signed)
Dr. Patel at the bedside  

## 2022-10-26 NOTE — ED Notes (Signed)
Pt transported on the monitor via stretcher by Dickie La, RN.

## 2022-10-26 NOTE — Assessment & Plan Note (Signed)
Vitals:   10/26/22 1627 10/26/22 1900  BP: (!) 125/57 (!) 144/56  Continue metoprolol at 12.5 mg daily.

## 2022-10-26 NOTE — Assessment & Plan Note (Signed)
Admit to med tele.  Transfuse one unit PRBC warmed.  Prednisone 60 mg daily. D/w hematology Dr. Guy Sandifer and pt to f/u with hem/ onc on outpatient setting after d/c on prednisone taper. Warm room and blankets.  No ICE.

## 2022-10-26 NOTE — H&P (Signed)
History and Physical    Patient: Philip Gordon:[id number] DOB: [birth year] DOA: 10/26/2022 DOS: the patient was seen and examined on 10/27/2022 PCP: Rosetta Posner, MD  Patient coming from: Home   Chief Complaint:  Chief Complaint  Patient presents with   Anemia    HPI: Philip Gordon is a 72 y.o. male with medical history significant for multiple sclerosis hypertension, prostate cancer, coming for anemia.  Patient states that he has a blood disorder where the cold is a problem and interferes with blood production. Patient sees hematology.  Patient states he is feeling well otherwise and wants to go home today he receives his transfusion.  Review of systems is negative except for malaise fatigue a little bit of weakness.  Patient is pale appearing.  Patient reports falls but none recently. In the emergency room patient is alert awake oriented afebrile blood work shows hypokalemia of 2.6 AKI of 1.26 EGFR of 60 elevated total bili of 3.3 LDH of 222 reticulocyte count of 3.5, haptoglobin pending.  Anemia with a hemoglobin of 7.4 platelet count 436 normal white count. EKG shows sinus rhythm with PVCs, left anterior fascicular block PR 168 QTc of 425.  Review of Systems: Review of Systems  Constitutional:  Positive for malaise/fatigue.  Neurological:  Positive for weakness.   Past Medical History:  Diagnosis Date   Depression    Hypercholesterolemia    Hypertension    Multiple sclerosis (HCC)    Sees Dr Harlen [facility])   Prostate cancer [facility])    Followed by Dr Achilles Dunk and Dr Doylene Canning   Past Surgical History:  Procedure Laterality Date   KNEE SURGERY     Social History:  reports that he has never smoked. His smokeless tobacco use includes chew. He reports that he does not drink alcohol and does not use drugs.  Allergies  Allergen Reactions   Gabapentin     Other reaction(s): Dizziness   Pregabalin     Other reaction(s): Dizziness    Family History  Problem  Relation Age of Onset   Hypertension Mother    Stroke Father    Diabetes Maternal Grandmother    Diabetes Maternal Grandfather    Leukemia Sister        died - age 87    Prior to Admission medications   Medication Sig Start Date End Date Taking? Authorizing Provider  acetaminophen (TYLENOL) 325 MG tablet Take by mouth every 6 (six) hours as needed. As needed for pain    [provider]  cetirizine (ZYRTEC) 10 MG tablet Take 10 mg by mouth daily.    [provider]  fenofibrate 54 MG tablet Take 54 mg by mouth daily. 10/04/21   [provider]  finasteride (PROSCAR) 5 MG tablet Take 1 tablet (5 mg total) by mouth daily. 09/03/19   Stoioff, Verna Czech, MD  fluticasone (FLONASE) 50 MCG/ACT nasal spray Place 2 sprays into both nostrils daily. 03/14/17   [place], MD  lidocaine (LIDODERM) 5 % Place 1 patch onto the skin daily. Remove & Discard patch within 12 hours or as directed by MD    [provider]  melatonin 5 MG TABS Take 5 mg by mouth at bedtime.    [provider]  metoprolol succinate (TOPROL XL) 25 MG 24 hr tablet Take 1 tablet (25 mg total) by mouth daily. Patient taking differently: Take 12.5 mg by mouth daily. 03/10/21   Debbe Odea, MD  mirtazapine (REMERON) 7.5 MG tablet Take 7.5  mg by mouth at bedtime. 06/08/20   [provider]  Multiple Vitamin (MULTIVITAMIN) capsule Take 1 capsule by mouth daily.    [provider]  mupirocin ointment (BACTROBAN) 2 % Apply 1 Application topically 3 (three) times daily. 10/24/21   [provider]  Polyethyl Glycol-Propyl Glycol (SYSTANE) 0.4-0.3 % SOLN Apply 1 drop to eye in the morning and at bedtime. In both eyes (OU) as needed for dry eyes.    [provider]  sertraline (ZOLOFT) 25 MG tablet Take 1 tablet (25 mg total) by mouth daily. 06/25/19   Dale Cope, MD  tiZANidine (ZANAFLEX) 2 MG tablet Take 2 mg by mouth at bedtime.    [provider]   traMADol (ULTRAM) 50 MG tablet Take 50 mg by mouth every 6 (six) hours as needed for moderate pain.    [provider]  vitamin B-12 (CYANOCOBALAMIN) 1000 MCG tablet Take 1 tablet (1,000 mcg total) by mouth daily. 10/25/15   Dale [place], MD     Vitals:   10/26/22 2300 10/26/22 2330 10/27/22 0000 10/27/22 0100  BP: 121/67 (!) 110/49 (!) 110/59 (!) 117/56  Pulse: 79 72 78 78  Resp: 20 19 17 19   Temp:   98.6 F (37 C)   TempSrc:   Axillary   SpO2: 100% 100% 100% 94%  Weight:      Height:       Physical Exam Vitals and nursing note reviewed.  Constitutional:      General: He is not in acute distress.    Appearance: He is normal weight.     Comments: Thin.   HENT:     Head: Normocephalic and atraumatic.     Right Ear: Hearing normal.     Left Ear: Hearing normal.     Nose: Nose normal. No nasal deformity.     Mouth/Throat:     Lips: Pink.     Dentition: Abnormal dentition. Dental caries present.     Tongue: No lesions.     Pharynx: Oropharynx is clear.  Eyes:     General: Lids are normal.     Extraocular Movements: Extraocular movements intact.  Cardiovascular:     Rate and Rhythm: Normal rate and regular rhythm.     Heart sounds: Normal heart sounds.  Pulmonary:     Effort: Pulmonary effort is normal.     Breath sounds: Normal breath sounds.  Abdominal:     General: Bowel sounds are normal. There is no distension.     Palpations: Abdomen is soft. There is no mass.     Tenderness: There is no abdominal tenderness.  Musculoskeletal:     Right lower leg: No edema.     Left lower leg: No edema.  Skin:    General: Skin is warm.  Neurological:     General: No focal deficit present.     Mental Status: He is alert and oriented to person, place, and time.     Cranial Nerves: Cranial nerves 2-12 are intact.  Psychiatric:        Attention and Perception: Attention normal.        Mood and Affect: Mood normal.        Speech: Speech normal.        Behavior:  Behavior normal. Behavior is cooperative.      Labs on Admission: I have personally reviewed following labs and imaging studies  CBC: Recent Labs  Lab 10/26/22 1630  WBC 9.9  HGB 7.4*  HCT 20.5*  MCV 94.9  PLT 436*   Basic Metabolic Panel: Recent Labs  Lab 10/26/22 1630  NA 139  K 2.6*  CL 106  CO2 25  GLUCOSE 96  BUN 19  CREATININE 1.26*  CALCIUM 8.7*   GFR: Estimated Creatinine Clearance: 48 mL/min (A) (by C-G formula based on SCr of 1.26 mg/dL (H)). Liver Function Tests: Recent Labs  Lab 10/26/22 1630  AST 34  ALT 32  ALKPHOS 87  BILITOT 3.3*  PROT 6.5  ALBUMIN 3.8   No results for input(s): "LIPASE", "AMYLASE" in the last 168 hours. No results for input(s): "AMMONIA" in the last 168 hours. Coagulation Profile: No results for input(s): "INR", "PROTIME" in the last 168 hours. Cardiac Enzymes: No results for input(s): "CKTOTAL", "CKMB", "CKMBINDEX", "TROPONINI" in the last 168 hours. BNP (last 3 results) No results for input(s): "PROBNP" in the last 8760 hours. HbA1C: No results for input(s): "HGBA1C" in the last 72 hours. CBG: No results for input(s): "GLUCAP" in the last 168 hours. Lipid Profile: No results for input(s): "CHOL", "HDL", "LDLCALC", "TRIG", "CHOLHDL", "LDLDIRECT" in the last 72 hours. Thyroid Function Tests: No results for input(s): "TSH", "T4TOTAL", "FREET4", "T3FREE", "THYROIDAB" in the last 72 hours. Anemia Panel: Recent Labs    10/26/22 2241  RETICCTPCT 3.5*   Urine analysis: Urinalysis    Component Value Date/Time   COLORURINE YELLOW (A) 10/25/2021 1554   APPEARANCEUR HAZY (A) 10/25/2021 1554   APPEARANCEUR Cloudy 10/09/2011 1508   LABSPEC 1.019 10/25/2021 1554   LABSPEC 1.016 10/09/2011 1508   PHURINE 6.0 10/25/2021 1554   GLUCOSEU NEGATIVE 10/25/2021 1554   GLUCOSEU Negative 10/09/2011 1508   HGBUR NEGATIVE 10/25/2021 1554   BILIRUBINUR NEGATIVE 10/25/2021 1554   BILIRUBINUR Negative 10/09/2011 1508   KETONESUR  NEGATIVE 10/25/2021 1554   PROTEINUR NEGATIVE 10/25/2021 1554   NITRITE NEGATIVE 10/25/2021 1554   LEUKOCYTESUR NEGATIVE 10/25/2021 1554   LEUKOCYTESUR Negative 10/09/2011 1508    Unresulted Labs (From admission, onward)     Start     Ordered   10/27/22 0500  Comprehensive metabolic panel  Tomorrow morning,   R        10/26/22 2118   10/27/22 0500  CBC  Tomorrow morning,   R        10/26/22 2118   10/26/22 2030  Differential  (Technologist smear review)  Add-on,   AD        10/26/22 2029   10/26/22 1908  Haptoglobin  Add-on,   AD        10/26/22 1909             Radiological Exams on Admission: No results found.   Data Reviewed: Relevant notes from primary care and specialist visits, past discharge summaries as available in EHR, including Care Everywhere. 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  Transfuse one unit PRBC warmed.  Prednisone 60 mg daily. D/w hematology Dr. Guy Sandifer and pt to f/u with hem/ onc on outpatient setting after d/c on prednisone taper. Warm room and blankets.  No ICE.   AKI (acute kidney injury) (HCC) Lab Results  Component Value Date   CREATININE 1.26 (H) 10/26/2022   CREATININE 1.38 (H) 10/27/2021   CREATININE 1.27 (  H) 10/26/2021  We will limit contrast and nephrotoxic meds /    Hypertension Vitals:   10/26/22 1627 10/26/22 1900  BP: (!) 125/57 (!) 144/56  Continue metoprolol at 12.5 mg daily.    Hypokalemia Replace and follow levels.   Bigeminy Monitor  on Telemetry.  Will correct potassium and mag pending if persistent will consider echo pt follows Dr. Azucena Cecil.    Depression Continue sertraline and Remeron.    DVT prophylaxis:  scd's  Consults:  None   Advance Care Planning:    Code Status: Full Code  Family Communication:  None  Disposition Plan:  Back to previous home environment  Severity of Illness: The appropriate patient status for this patient is OBSERVATION. Observation status is judged to be reasonable and necessary in order to provide the required intensity of service to ensure the patient's safety. The patient's presenting symptoms, physical exam findings, and initial radiographic and laboratory data in the context of their medical condition is felt to place them at decreased risk for further clinical deterioration. Furthermore, it is anticipated that the patient will be medically stable for discharge from the hospital within 2 midnights of admission.   Author: Gertha Calkin, MD 10/27/2022 2:44 AM  For on call review [email or web address].

## 2022-10-26 NOTE — Assessment & Plan Note (Signed)
Replete and recheck 

## 2022-10-27 ENCOUNTER — Observation Stay
Admit: 2022-10-27 | Discharge: 2022-10-27 | Disposition: A | Discharge status: Home / Self Care | Payer: Medicare Other | Attending: Student | Admitting: Student

## 2022-10-27 DIAGNOSIS — D649 Anemia, unspecified: Secondary | ICD-10-CM | POA: Diagnosis present

## 2022-10-27 DIAGNOSIS — I444 Left anterior fascicular block: Secondary | ICD-10-CM | POA: Diagnosis present

## 2022-10-27 DIAGNOSIS — Z66 Do not resuscitate: Secondary | ICD-10-CM | POA: Diagnosis present

## 2022-10-27 DIAGNOSIS — I493 Ventricular premature depolarization: Secondary | ICD-10-CM

## 2022-10-27 DIAGNOSIS — D5912 Cold autoimmune hemolytic anemia: Secondary | ICD-10-CM

## 2022-10-27 DIAGNOSIS — Z806 Family history of leukemia: Secondary | ICD-10-CM | POA: Diagnosis not present

## 2022-10-27 DIAGNOSIS — I5021 Acute systolic (congestive) heart failure: Secondary | ICD-10-CM | POA: Diagnosis not present

## 2022-10-27 DIAGNOSIS — I10 Essential (primary) hypertension: Secondary | ICD-10-CM | POA: Diagnosis present

## 2022-10-27 DIAGNOSIS — N179 Acute kidney failure, unspecified: Secondary | ICD-10-CM | POA: Diagnosis present

## 2022-10-27 DIAGNOSIS — Z79899 Other long term (current) drug therapy: Secondary | ICD-10-CM | POA: Diagnosis not present

## 2022-10-27 DIAGNOSIS — R008 Other abnormalities of heart beat: Secondary | ICD-10-CM | POA: Diagnosis present

## 2022-10-27 DIAGNOSIS — G35 Multiple sclerosis: Secondary | ICD-10-CM | POA: Diagnosis present

## 2022-10-27 DIAGNOSIS — Z8546 Personal history of malignant neoplasm of prostate: Secondary | ICD-10-CM | POA: Diagnosis not present

## 2022-10-27 DIAGNOSIS — E7800 Pure hypercholesterolemia, unspecified: Secondary | ICD-10-CM | POA: Diagnosis present

## 2022-10-27 DIAGNOSIS — Z720 Tobacco use: Secondary | ICD-10-CM | POA: Diagnosis not present

## 2022-10-27 DIAGNOSIS — R001 Bradycardia, unspecified: Secondary | ICD-10-CM | POA: Diagnosis not present

## 2022-10-27 DIAGNOSIS — R5381 Other malaise: Secondary | ICD-10-CM | POA: Diagnosis present

## 2022-10-27 DIAGNOSIS — Z8249 Family history of ischemic heart disease and other diseases of the circulatory system: Secondary | ICD-10-CM | POA: Diagnosis not present

## 2022-10-27 DIAGNOSIS — E876 Hypokalemia: Secondary | ICD-10-CM | POA: Diagnosis present

## 2022-10-27 DIAGNOSIS — D589 Hereditary hemolytic anemia, unspecified: Secondary | ICD-10-CM | POA: Diagnosis present

## 2022-10-27 DIAGNOSIS — I4719 Other supraventricular tachycardia: Secondary | ICD-10-CM | POA: Diagnosis not present

## 2022-10-27 DIAGNOSIS — Z823 Family history of stroke: Secondary | ICD-10-CM | POA: Diagnosis not present

## 2022-10-27 DIAGNOSIS — F32A Depression, unspecified: Secondary | ICD-10-CM | POA: Diagnosis present

## 2022-10-27 DIAGNOSIS — Z833 Family history of diabetes mellitus: Secondary | ICD-10-CM | POA: Diagnosis not present

## 2022-10-27 DIAGNOSIS — E875 Hyperkalemia: Secondary | ICD-10-CM | POA: Diagnosis not present

## 2022-10-27 DIAGNOSIS — I472 Ventricular tachycardia, unspecified: Secondary | ICD-10-CM | POA: Diagnosis present

## 2022-10-27 DIAGNOSIS — L89223 Pressure ulcer of left hip, stage 3: Secondary | ICD-10-CM | POA: Diagnosis present

## 2022-10-27 DIAGNOSIS — I4729 Other ventricular tachycardia: Secondary | ICD-10-CM | POA: Diagnosis not present

## 2022-10-27 LAB — MAGNESIUM: Magnesium: 1.8 mg/dL (ref 1.7–2.4)

## 2022-10-27 LAB — HEMOGLOBIN AND HEMATOCRIT, BLOOD
HCT: 21.9 % — ABNORMAL LOW (ref 39.0–52.0)
Hemoglobin: 7.8 g/dL — ABNORMAL LOW (ref 13.0–17.0)

## 2022-10-27 LAB — MRSA NEXT GEN BY PCR, NASAL: MRSA by PCR Next Gen: NOT DETECTED

## 2022-10-27 MED ORDER — MELATONIN 5 MG PO TABS
5.0000 mg | ORAL_TABLET | Freq: Every day | ORAL | Status: DC
  Administered 2022-10-27 – 2022-10-29 (×3): 5 mg via ORAL
  Filled 2022-10-27 (×3): qty 1

## 2022-10-27 MED ORDER — MIRTAZAPINE 15 MG PO TABS
15.0000 mg | ORAL_TABLET | Freq: Every day | ORAL | Status: DC
  Administered 2022-10-27 – 2022-10-29 (×3): 15 mg via ORAL
  Filled 2022-10-27 (×3): qty 1

## 2022-10-27 MED ORDER — MAGNESIUM SULFATE 2 GM/50ML IV SOLN
2.0000 g | Freq: Once | INTRAVENOUS | Status: AC
  Administered 2022-10-27: 2 g via INTRAVENOUS
  Filled 2022-10-27: qty 50

## 2022-10-27 MED ORDER — TIZANIDINE HCL 4 MG PO TABS
4.0000 mg | ORAL_TABLET | Freq: Every day | ORAL | Status: DC
  Administered 2022-10-27 – 2022-10-29 (×3): 4 mg via ORAL
  Filled 2022-10-27 (×3): qty 1

## 2022-10-27 NOTE — Consult Note (Addendum)
Cardiology Consultation   Patient ID: Philip Gordon MRN: [id number]; DOB: [birth year]  Admit date: 10/26/2022 Date of Consult: 10/27/2022  PCP:  Rosetta Posner, MD   [facility] Providers Cardiologist:  Debbe Odea, MD        Patient Profile:   Philip Gordon is a 72 y.o. male with a hx of PVCs, VT, hypertension, multiple sclerosis who is being seen 10/27/2022 for the evaluation of PVCs at the request of Dr. Lucianne Muss.  History of Present Illness:   Philip Gordon is a 72 year old gentleman with history of multiple sclerosis, hypertension, nonsustained VT, PVCs, prostate cancer presenting to the hospital due to anemia.  Patient lives in an assisted living facility, has been feeling weak of late.  Had blood work done showing hemoglobin 7.4.  Admitted with possible hemolytic anemia.  Planning on getting blood transfusion.  Telemetry shows frequent ectopy, nonsustained VT.  He denies palpitations or dizziness.  Endorse feeling tired.  Patient well-known to our service, evaluated by EP in the past due to PVCs and nonsustained VT.  Not a candidate for arrhythmia suppression or advanced invasive therapies due to comorbidities.  Has been managed with metoprolol which he endorses being compliant with.  Last echo with normal EF.   Past Medical History:  Diagnosis Date   Depression    Hypercholesterolemia    Hypertension    Multiple sclerosis (HCC)    Sees Dr Har[facility])   Prostate cancer [facility])    Followed by Dr Achilles Dunk and Dr Doylene Canning    Past Surgical History:  Procedure Laterality Date   KNEE SURGERY       Home Medications:  Prior to Admission medications   Medication Sig Start Date End Date Taking? Authorizing Provider  acetaminophen (TYLENOL) 325 MG tablet Take by mouth every 6 (six) hours as needed. As needed for pain   Yes [provider]  cetirizine (ZYRTEC) 10 MG tablet Take 10 mg by mouth daily.   Yes [provider]   dextromethorphan (DELSYM) 30 MG/5ML liquid Take 10 mLs by mouth every 12 (twelve) hours as needed (cough).   Yes [provider]  fenofibrate 54 MG tablet Take 54 mg by mouth daily. 10/04/21  Yes [provider]  finasteride (PROSCAR) 5 MG tablet Take 1 tablet (5 mg total) by mouth daily. 09/03/19  Yes Stoioff, Verna Czech, MD  fluticasone (FLONASE) 50 MCG/ACT nasal spray Place 2 sprays into both nostrils daily. 03/14/17  Yes Dale [place], MD  latanoprost (XALATAN) 0.005 % ophthalmic solution Place 1 drop into both eyes at bedtime.   Yes [provider]  lidocaine (LIDODERM) 5 % Place 1 patch onto the skin daily. Remove & Discard patch within 12 hours or as directed by MD   Yes [provider]  melatonin 3 MG TABS tablet Take 3 mg by mouth at bedtime.   Yes [provider]  metoprolol succinate (TOPROL XL) 25 MG 24 hr tablet Take 1 tablet (25 mg total) by mouth daily. 03/10/21  Yes Debbe Odea, MD  mirtazapine (REMERON) 15 MG tablet Take 15 mg by mouth at bedtime. 06/08/20  Yes [provider]  Multiple Vitamin (MULTIVITAMIN) capsule Take 1 capsule by mouth daily.   Yes [provider]  naloxone (NARCAN) nasal spray 4 mg/0.1 mL Place 1 spray into the nose 3 (three) times daily as needed.   Yes [provider]  Polyethyl Glycol-Propyl Glycol (SYSTANE) 0.4-0.3 % SOLN Place 1 drop into both eyes 2 (  two) times daily in the am and at bedtime..   Yes [provider]  sertraline (ZOLOFT) 25 MG tablet Take 1 tablet (25 mg total) by mouth daily. 06/25/19  Yes Dale [place], MD  tiZANidine (ZANAFLEX) 2 MG tablet Take 2 mg by mouth at bedtime.   Yes [provider]  vitamin B-12 (CYANOCOBALAMIN) 1000 MCG tablet Take 1 tablet (1,000 mcg total) by mouth daily. Patient taking differently: Take 500 mcg by mouth daily. 10/25/15  Yes Dale [place], MD    Inpatient Medications: Scheduled Meds:  Chlorhexidine Gluconate  Cloth  6 each Topical Q0600   predniSONE  60 mg Oral Q breakfast   sodium chloride flush  3 mL Intravenous Q12H   Continuous Infusions:  sodium chloride Stopped (10/26/22 1928)   PRN Meds: acetaminophen **OR** acetaminophen, HYDROcodone-acetaminophen, morphine injection  Allergies:    Allergies  Allergen Reactions   Gabapentin     Other reaction(s): Dizziness   Pregabalin     Other reaction(s): Dizziness    Social History:   Social History   Socioeconomic History   Marital status: Divorced    Spouse name: Not on file   Number of children: Not on file   Years of education: Not on file   Highest education level: Not on file  Occupational History   Not on file  Tobacco Use   Smoking status: Never   Smokeless tobacco: Current    Types: Chew   Tobacco comments:    Is going to try and cut down on the amount.   Vaping Use   Vaping status: Never Used  Substance and Sexual Activity   Alcohol use: No    Alcohol/week: 2.0 standard drinks of alcohol    Types: 2 Cans of beer per week   Drug use: No   Sexual activity: Not Currently  Other Topics Concern   Not on file  Social History Narrative   Not on file   Social Determinants of Health   Financial Resource Strain: Not on file  Food Insecurity: No Food Insecurity (10/26/2022)   Hunger Vital Sign    Worried About Running Out of Food in the Last Year: Never true    Ran Out of Food in the Last Year: Never true  Transportation Needs: No Transportation Needs (10/26/2022)   PRAPARE - Administrator, Civil Service (Medical): No    Lack of Transportation (Non-Medical): No  Physical Activity: Not on file  Stress: Not on file  Social Connections: Not on file  Intimate Partner Violence: Not At Risk (10/26/2022)   Humiliation, Afraid, Rape, and Kick questionnaire    Fear of Current or Ex-Partner: No    Emotionally Abused: No    Physically Abused: No    Sexually Abused: No    Family History:    Family History   Problem Relation Age of Onset   Hypertension Mother    Stroke Father    Diabetes Maternal Grandmother    Diabetes Maternal Grandfather    Leukemia Sister        died - age 47     ROS:  Please see the history of present illness.   All other ROS reviewed and negative.     Physical Exam/Data:   Vitals:   10/27/22 1245 10/27/22 1300 10/27/22 1314 10/27/22 1400  BP:  (!) 119/49 125/61 120/66  Pulse: 87 (!) 52 (!) 40 (!) 47  Resp: (!) 30 (!) 22 (!) 23 (!) 23  Temp: 98.3 F (36.8 C)  98.6 F (37 C)   TempSrc: Oral  Oral   SpO2: 99% 99% 98% 98%  Weight:      Height:        Intake/Output Summary (Last 24 hours) at 10/27/2022 1534 Last data filed at 10/27/2022 1200 Gross per 24 hour  Intake 939 ml  Output --  Net 939 ml      10/27/2022    5:00 AM 10/26/2022   10:22 PM 10/26/2022    4:27 PM  Last 3 Weights  Weight (lbs) 147 lb 11.3 oz 145 lb 8.1 oz 155 lb 3.3 oz  Weight (kg) 67 kg 66 kg 70.4 kg     Body mass index is 21.81 kg/m.  General:  Well nourished, well developed, appears frail, soft-spoken HEENT: normal Neck: no JVD Vascular: No carotid bruits; Distal pulses 2+ bilaterally Cardiac:  normal S1, S2; RRR Lungs:  clear to auscultation bilaterally, no wheezing, rhonchi or rales  Abd: soft, nontender, no hepatomegaly  Ext: no edema Musculoskeletal:  No deformities, appears weak overall Skin: warm and dry  Neuro:  CNs 2-12 intact, no focal abnormalities noted Psych:  Normal affect   EKG:  The EKG was personally reviewed and demonstrates: Sinus rhythm, PVCs Telemetry:  Telemetry was personally reviewed and demonstrates: Sinus rhythm, PVCs, nonsustained VT  Relevant CV Studies:  TTEcho 03/2021  1. Left ventricular ejection fraction, by estimation, is 55 to 60%. The  left ventricle has normal function. The left ventricle has no regional  wall motion abnormalities. Left ventricular diastolic parameters are  consistent with Grade II diastolic  dysfunction  (pseudonormalization).   2. Right ventricular systolic function is normal. The right ventricular  size is normal.   3. The mitral valve is normal in structure. Mild mitral valve  regurgitation. No evidence of mitral stenosis.   4. The aortic valve was not well visualized. Aortic valve regurgitation  is not visualized. Aortic valve sclerosis is present, with no evidence of  aortic valve stenosis.   5. The inferior vena cava is normal in size with greater than 50%  respiratory variability, suggesting right atrial pressure of 3 mmHg.    Laboratory Data:  High Sensitivity Troponin:  No results for input(s): "TROPONINIHS" in the last 720 hours.   Chemistry Recent Labs  Lab 10/26/22 1630 10/27/22 0334  NA 139 142  K 2.6* 5.0  CL 106 113*  CO2 25 21*  GLUCOSE 96 119*  BUN 19 17  CREATININE 1.26* 1.09  CALCIUM 8.7* 8.8*  MG  --  1.8  GFRNONAA 60* >60  ANIONGAP 8 8    Recent Labs  Lab 10/26/22 1630 10/27/22 0334  PROT 6.5 6.0*  ALBUMIN 3.8 3.4*  AST 34 29  ALT 32 25  ALKPHOS 87 78  BILITOT 3.3* 2.8*   Lipids No results for input(s): "CHOL", "TRIG", "HDL", "LABVLDL", "LDLCALC", "CHOLHDL" in the last 168 hours.  Hematology Recent Labs  Lab 10/26/22 1630 10/26/22 2241 10/27/22 0334  WBC 9.9  --  8.0  RBC 2.16* 0.36* 2.05*  HGB 7.4*  --  7.1*  HCT 20.5*  --  20.0*  MCV 94.9  --  97.6  MCH 34.3*  --  34.6*  MCHC 36.1*  --  35.5  RDW 14.3  --  15.8*  PLT 436*  --  394   Thyroid No results for input(s): "TSH", "FREET4" in the last 168 hours.  BNPNo results for input(s): "BNP", "PROBNP" in the last 168 hours.  DDimer No results for input(s): "  DDIMER" in the last 168 hours.   Radiology/Studies:  No results found.   Assessment and Plan:   PVCs, nonsustained VT -Chronic, stable, no plans for AAD or invasive therapies -Continue Toprol-XL 25 mg daily -Mag nl, K has now been repleted. Keep k>4, mg>2 -Last echo with normal EF  2.  Hypertension -BP  control -Continue Toprol-XL 25 mg daily  3. Anemia, history of multiple sclerosis -Incision as per primary team  No additional cardiac testing planned at this point.  PVCs and nonsustained VT is chronic and not acute.  Continue metoprolol as above.  Please let us know if additional input is needed.  Total encounter time more than 85 minutes  Greater than 50% was spent in counseling and coordination of care with the patient  Signed, Debbe Odea, MD  10/27/2022 3:34 PM

## 2022-10-27 NOTE — Progress Notes (Signed)
Patient A/O x3 with intermittent confusion, room air, stable pressures. Patient received 1unit RBCs. Tolerated well. Healthcare POA gave permission to give wife, Eldred Manges updates. Mrs Katrinka Blazing updated via phone. Report given to floor RN.

## 2022-10-27 NOTE — Progress Notes (Signed)
[facility] Progress Note  Patient: Philip Gordon    ZOX:[id number]  DOA: 10/26/2022     Date of Service: the patient was seen and examined on 10/27/2022  Chief Complaint  Patient presents with   Anemia   Brief hospital course: [name] is a 72 y.o. male with medical history significant for multiple sclerosis hypertension, prostate cancer, coming for anemia.  Patient states that he has a blood disorder where the cold is a problem and interferes with blood production. Patient sees hematology.  Patient states he is feeling well otherwise and wants to go home today he receives his transfusion.  Review of systems is negative except for malaise fatigue a little bit of weakness.  Patient is pale appearing.  Patient reports falls but none recently. In the emergency room patient is alert awake oriented afebrile blood work shows hypokalemia of 2.6 AKI of 1.26 EGFR of 60 elevated total bili of 3.3 LDH of 222 reticulocyte count of 3.5, haptoglobin pending.  Anemia with a hemoglobin of 7.4 platelet count 436 normal white count. EKG shows sinus rhythm with PVCs, left anterior fascicular block PR 168 QTc of 425.  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 daily, follow with oncology to gradually taper off as an outpatient Hb 7.4-7.1, transfuse 1 unit PRBC LDH 222 elevated, reticulocyte count 3.5 elevated Repeat H&H, transfuse if hemoglobin less than 7 Hematology consulted   # PVCs, nonsustained VT, Chronic and stable Cardiology consulted, recommended no plans for AAD or invasive therapies -Continue Toprol-XL 25 mg daily -Mag nl, K has now been repleted. Keep k>4, mg>2 -Last echo with normal EF Follow repeat 2D echocardiogram  # Hypertension, continue metoprolol Monitor BP and titrate medications accordingly  # Depression Continue sertraline and Remeron   #Hypokalemia, potassium repleted.   Resolved. Monitor electrolytes daily  Body mass index is 21.81 kg/m.  Interventions:  Diet: Dysphagia 1 diet DVT Prophylaxis: SCD, pharmacological prophylaxis contraindicated due to hemolytic anemia    Advance goals of care discussion: Full code  Family Communication: family was not present at bedside, at the time of interview.  The pt provided permission to discuss medical plan with the family. Opportunity was given to ask question and all questions were answered satisfactorily.   Disposition:  Pt is from [facility], admitted with symptomatic anemia, hemoglobin 7.4, still has low Hb, which precludes a safe discharge. [facility] to [facility], when stable.  Subjective: No significant events overnight, patient was resting audibly, denies any chest pain or palpitation, no shortness of breath.  Physical Exam: General: NAD, lying comfortably Appear in no distress, affect appropriate Eyes: PERRLA ENT: Oral Mucosa Clear, moist  Neck: no JVD,  Cardiovascular: S1 and S2 Present, no Murmur, intermittent PVCs Respiratory: good respiratory effort, Bilateral Air entry equal and Decreased, no Crackles, no wheezes Abdomen: Bowel Sound present, Soft and no tenderness,  Skin: no rashes Extremities: no Pedal edema, no calf tenderness Neurologic: without any new focal findings Gait not checked due to patient safety concerns  Vitals:   10/27/22 1400 10/27/22 1500 10/27/22 1556 10/27/22 1625  BP: 120/66 120/71 123/65 (!) 119/57  Pulse: (!) 47 85 84 66  Resp: (!) 23 (!) 21 (!) 25 20  Temp:   98.4 F (36.9 C) 98.5 F (36.9 C)  TempSrc:   Oral Oral  SpO2: 98% 100% 100% 100%  Weight:      Height:  Intake/Output Summary (Last 24 hours) at 10/27/2022 1635 Last data filed at 10/27/2022 1556 Gross per 24 hour  Intake 1293 ml  Output --  Net 1293 ml   Filed Weights   10/26/22 1627 10/26/22 2222 10/27/22 0500  Weight: 70.4 kg 66 kg 67 kg    Data Reviewed: I have personally reviewed and  interpreted daily labs, tele strips, imagings as discussed above. I reviewed all nursing notes, pharmacy notes, vitals, pertinent old records I have discussed plan of care as described above with RN and patient/family.  CBC: Recent Labs  Lab 10/26/22 1630 10/27/22 0334  WBC 9.9 8.0  HGB 7.4* 7.1*  HCT 20.5* 20.0*  MCV 94.9 97.6  PLT 436* 394   Basic Metabolic Panel: Recent Labs  Lab 10/26/22 1630 10/27/22 0334  NA 139 142  K 2.6* 5.0  CL 106 113*  CO2 25 21*  GLUCOSE 96 119*  BUN 19 17  CREATININE 1.26* 1.09  CALCIUM 8.7* 8.8*  MG  --  1.8    Studies: No results found.  Scheduled Meds:  Chlorhexidine Gluconate Cloth  6 each Topical Q0600   predniSONE  60 mg Oral Q breakfast   sodium chloride flush  3 mL Intravenous Q12H   Continuous Infusions:  sodium chloride Stopped (10/26/22 1928)   PRN Meds: acetaminophen **OR** acetaminophen, HYDROcodone-acetaminophen, morphine injection  Time spent: 35 minutes  Author: Gillis Santa. MD [facility] 10/27/2022 4:35 PM  To reach On-call, see care teams to locate the attending and reach out to them via [email or web address]. If 7PM-7AM, please contact night-coverage If you still have difficulty reaching the attending provider, please page the [facility] (Director on Call) for [facility] on amion for assistance.

## 2022-10-27 NOTE — Assessment & Plan Note (Signed)
Lab Results  Component Value Date   CREATININE 1.26 (H) 10/26/2022   CREATININE 1.38 (H) 10/27/2021   CREATININE 1.27 (H) 10/26/2021  We will limit contrast and nephrotoxic meds /

## 2022-10-27 NOTE — Progress Notes (Signed)
  Echocardiogram 2D Echocardiogram has not been performed. The patient declined having echo completed.  Philip Gordon 10/27/2022, 3:04 PM

## 2022-10-28 DIAGNOSIS — D5912 Cold autoimmune hemolytic anemia: Secondary | ICD-10-CM | POA: Diagnosis not present

## 2022-10-28 LAB — CBC
HCT: 21.3 % — ABNORMAL LOW (ref 39.0–52.0)
Hemoglobin: 7.4 g/dL — ABNORMAL LOW (ref 13.0–17.0)
MCH: 33.2 pg (ref 26.0–34.0)
MCHC: 34.7 g/dL (ref 30.0–36.0)
MCV: 95.5 fL (ref 80.0–100.0)
Platelets: 376 10*3/uL (ref 150–400)
RBC: 2.23 MIL/uL — ABNORMAL LOW (ref 4.22–5.81)
RDW: 14.9 % (ref 11.5–15.5)
WBC: 11.9 10*3/uL — ABNORMAL HIGH (ref 4.0–10.5)
nRBC: 0.3 % — ABNORMAL HIGH (ref 0.0–0.2)

## 2022-10-28 LAB — BASIC METABOLIC PANEL WITH GFR
Anion gap: 5 (ref 5–15)
BUN: 18 mg/dL (ref 8–23)
CO2: 24 mmol/L (ref 22–32)
Calcium: 8.1 mg/dL — ABNORMAL LOW (ref 8.9–10.3)
Chloride: 110 mmol/L (ref 98–111)
Creatinine, Ser: 1.15 mg/dL (ref 0.61–1.24)
GFR, Estimated: >60 mL/min (ref >60)
Glucose, Bld: 92 mg/dL (ref 70–99)
Potassium: 3.5 mmol/L (ref 3.5–5.1)
Sodium: 139 mmol/L (ref 135–145)

## 2022-10-28 LAB — HEPATIC FUNCTION PANEL
ALT: 21 U/L (ref 0–44)
AST: 20 U/L (ref 15–41)
Albumin: 2.9 g/dL — ABNORMAL LOW (ref 3.5–5.0)
Alkaline Phosphatase: 61 U/L (ref 38–126)
Bilirubin, Direct: 0.4 mg/dL — ABNORMAL HIGH (ref 0.0–0.2)
Indirect Bilirubin: 1.9 mg/dL — ABNORMAL HIGH (ref 0.3–0.9)
Total Bilirubin: 2.3 mg/dL — ABNORMAL HIGH (ref 0.3–1.2)
Total Protein: 5.1 g/dL — ABNORMAL LOW (ref 6.5–8.1)

## 2022-10-28 LAB — HEMOGLOBIN AND HEMATOCRIT, BLOOD
HCT: 21.1 % — ABNORMAL LOW (ref 39.0–52.0)
Hemoglobin: 10.3 g/dL — ABNORMAL LOW (ref 13.0–17.0)

## 2022-10-28 LAB — PREPARE RBC (CROSSMATCH)

## 2022-10-28 LAB — PHOSPHORUS: Phosphorus: 3.5 mg/dL (ref 2.5–4.6)

## 2022-10-28 LAB — MAGNESIUM: Magnesium: 2.3 mg/dL (ref 1.7–2.4)

## 2022-10-28 MED ORDER — POTASSIUM CHLORIDE CRYS ER 20 MEQ PO TBCR
40.0000 meq | EXTENDED_RELEASE_TABLET | Freq: Once | ORAL | Status: AC
  Administered 2022-10-28: 40 meq via ORAL
  Filled 2022-10-28: qty 2

## 2022-10-28 MED ORDER — MIDODRINE HCL 5 MG PO TABS
2.5000 mg | ORAL_TABLET | Freq: Three times a day (TID) | ORAL | Status: DC
  Administered 2022-10-28 – 2022-10-29 (×5): 2.5 mg via ORAL
  Filled 2022-10-28 (×5): qty 1

## 2022-10-28 MED ORDER — SODIUM CHLORIDE 0.9% IV SOLUTION: Freq: Once | INTRAVENOUS | Status: AC

## 2022-10-28 NOTE — Evaluation (Signed)
Physical Therapy Evaluation Patient Details Name: Philip Gordon MRN: [id number] DOB: 10-Sep-1950 Today's Date: 10/28/2022  History of Present Illness  Pt is a 72 y.o. male presenting to hospital 10/26/22 with concerns for low Hgb; increased fatigue/weakness.  Pt admitted with hemolytic anemia d/t cold antibody.  PMH includes MS, htn, prostate CA, knee sx, PVC's.  Clinical Impression  Prior to hospital admission, pt reports being a resident at [facility]; pt also reports typically needing assist with functional mobility (can walk with RW and B AFO's with SBA but sometimes uses manual w/c).  No c/o pain during session.  Currently pt is mod assist semi-supine to sitting edge of bed; mod assist to stand from bed; and min to mod assist to walk a few feet bed to recliner with RW use.  Pt noted with L LE weakness > R LE (pt reports chronic).  Pt would currently benefit from skilled PT to address noted impairments and functional limitations (see below for any additional details).  Upon hospital discharge, pt would benefit from ongoing therapy.     If plan is discharge home, recommend the following: A lot of help with walking and/or transfers;A lot of help with bathing/dressing/bathroom;Assist for transportation;Help with stairs or ramp for entrance   Can travel by private vehicle   No    Equipment Recommendations None recommended by PT (pt reports already having RW and manual w/c)  Recommendations for Other Services       Functional Status Assessment Patient has had a recent decline in their functional status and demonstrates the ability to make significant improvements in function in a reasonable and predictable amount of time.     Precautions / Restrictions Precautions Precautions: Fall Restrictions Weight Bearing Restrictions: No Other Position/Activity Restrictions: B AFO's      Mobility  Bed Mobility Overal bed mobility: Needs Assistance Bed Mobility: Supine to Sit     Supine  to sit: Mod assist, HOB elevated     General bed mobility comments: assist for trunk; vc's for technique    Transfers Overall transfer level: Needs assistance Equipment used: Rolling walker (2 wheels) Transfers: Sit to/from Stand Sit to Stand: Mod assist           General transfer comment: assist to initiate stand and control descent sitting; vc's for UE placement    Ambulation/Gait Ambulation/Gait assistance: Min assist, Mod assist Gait Distance (Feet): 3 Feet (bed to recliner) Assistive device: Rolling walker (2 wheels)   Gait velocity: decreased     General Gait Details: assist for weight shifting; increased effort/time to take step with L LE; decreased stance time L LE; decreased B LE step length/foot clearance  Stairs            Wheelchair Mobility     Tilt Bed    Modified Rankin (Stroke Patients Only)       Balance Overall balance assessment: Needs assistance Sitting-balance support: No upper extremity supported, Feet supported Sitting balance-Leahy Scale: Good Sitting balance - Comments: steady reaching within BOS   Standing balance support: Bilateral upper extremity supported, Reliant on assistive device for balance Standing balance-Leahy Scale: Fair Standing balance comment: steady static standing with B UE support on RW                             Pertinent Vitals/Pain Pain Assessment Pain Assessment: No/denies pain Vitals (HR and SpO2 on room air) stable and WFL throughout treatment session.  Home Living Family/patient expects to be discharged to:: Skilled nursing facility                   Additional Comments: Pt reports being a resident of [facility] for past 3 years.    Prior Function Prior Level of Function : Needs assist             Mobility Comments: Pt reports typically always having assist with mobility (can walk with RW and B AFO's with SBA; sometimes uses w/c and can propel self on own).        Hand Dominance        Extremity/Trunk Assessment   Upper Extremity Assessment Upper Extremity Assessment: Defer to OT evaluation;Overall [facility] for tasks assessed    Lower Extremity Assessment Lower Extremity Assessment: RLE deficits/detail;LLE deficits/detail RLE Deficits / Details: hip flexion 4/5, knee flexion/extension 4/5, DF at least 3/5 LLE Deficits / Details: hip flexion 3/5, knee flexion/extension 4-/5, DF at least 2+/5 (pt reports L LE weaker than R LE baseline)       Communication   Communication:  (difficulty understanding pt intermittently (words not clear))  Cognition Arousal/Alertness: Awake/alert Behavior During Therapy: WFL for tasks assessed/performed Overall Cognitive Status: Within Functional Limits for tasks assessed                                 General Comments: Oriented to person, place, time, and reports he is in hospital for blood transfusion.        General Comments General comments (skin integrity, edema, etc.): pt requiring assist to donn B AFO's.  Nursing cleared pt for participation in physical therapy.  Pt agreeable to PT session.    Exercises  Transfer training   Assessment/Plan    PT Assessment Patient needs continued PT services  PT Problem List Decreased strength;Decreased activity tolerance;Decreased balance;Decreased mobility       PT Treatment Interventions DME instruction;Gait training;Functional mobility training;Therapeutic activities;Therapeutic exercise;Balance training;Patient/family education    PT Goals (Current goals can be found in the Care Plan section)  Acute Rehab PT Goals Patient Stated Goal: to improve strength and mobility PT Goal Formulation: With patient Time For Goal Achievement: 11/11/22 Potential to Achieve Goals: Good    Frequency Min 1X/week     Co-evaluation               AM-PAC PT "6 Clicks" Mobility  Outcome Measure Help needed turning from your back to your side while in  a flat bed without using bedrails?: A Little Help needed moving from lying on your back to sitting on the side of a flat bed without using bedrails?: A Lot Help needed moving to and from a bed to a chair (including a wheelchair)?: A Lot Help needed standing up from a chair using your arms (e.g., wheelchair or bedside chair)?: A Lot Help needed to walk in hospital room?: Total Help needed climbing 3-5 steps with a railing? : Total 6 Click Score: 11    End of Session Equipment Utilized During Treatment: Gait belt Activity Tolerance: Patient limited by fatigue Patient left: in chair;with call bell/phone within reach;with chair alarm set Nurse Communication: Mobility status;Precautions PT Visit Diagnosis: Other abnormalities of gait and mobility (R26.89);Muscle weakness (generalized) (M62.81)    Time: 9147-8295 PT Time Calculation (min) (ACUTE ONLY): 33 min   Charges:   PT Evaluation $PT Eval Low Complexity: 1 Low PT Treatments $Therapeutic Activity: 8-22 mins  PT General Charges $$ ACUTE PT VISIT: 1 Visit        Hendricks Limes, PT 10/28/22, 9:58 AM

## 2022-10-28 NOTE — Evaluation (Signed)
Occupational Therapy Evaluation Patient Details Name: Philip Gordon MRN: [id number] DOB: 05-30-1950 Today's Date: 10/28/2022   History of Present Illness Pt is a 72 y.o. male presenting to hospital 10/26/22 with concerns for low Hgb; increased fatigue/weakness.  Pt admitted with hemolytic anemia d/t cold antibody.  PMH includes MS, htn, prostate CA, knee sx, PVC's.   Clinical Impression   Patient received for OT evaluation. See flowsheet below for details of function. Generally, patient requiring MOD A for bed mobility, MIN A with RW and small shuffling steps from recliner to bed, and set up-MAX A for ADLs. Patient will benefit from continued OT while in acute care.       Recommendations for follow up therapy are one component of a multi-disciplinary discharge planning process, led by the attending physician.  Recommendations may be updated based on patient status, additional functional criteria and insurance authorization.   Assistance Recommended at Discharge Frequent or constant Supervision/Assistance  Patient can return home with the following A lot of help with walking and/or transfers;A lot of help with bathing/dressing/bathroom;Assistance with cooking/housework;Direct supervision/assist for medications management;Direct supervision/assist for financial management;Assist for transportation;Help with stairs or ramp for entrance    Functional Status Assessment  Patient has had a recent decline in their functional status and demonstrates the ability to make significant improvements in function in a reasonable and predictable amount of time.  Equipment Recommendations  None recommended by OT    Recommendations for Other Services       Precautions / Restrictions Precautions Precautions: Fall Restrictions Weight Bearing Restrictions: No Other Position/Activity Restrictions: B AFO's      Mobility Bed Mobility Overal bed mobility: Needs Assistance Bed Mobility: Sit to Supine        Sit to supine: Mod assist   General bed mobility comments: assist for BIL LE    Transfers Overall transfer level: Needs assistance Equipment used: Rolling walker (2 wheels) Transfers: Sit to/from Stand Sit to Stand: Min assist (from chair to standing)           General transfer comment: cues for hand placement.      Balance Overall balance assessment: Needs assistance Sitting-balance support: No upper extremity supported, Feet supported Sitting balance-Leahy Scale: Good     Standing balance support: Bilateral upper extremity supported, Reliant on assistive device for balance Standing balance-Leahy Scale: Poor                             ADL either performed or assessed with clinical judgement   ADL Overall ADL's : Needs assistance/impaired Eating/Feeding: Independent Eating/Feeding Details (indicate cue type and reason): drinking beverage with straw Grooming: Applying deodorant;Oral care;Set up;Minimal assistance;Sitting Grooming Details (indicate cue type and reason): seated in chair; OT assisted with putting toothpaste onto toothbrush; OT helped pt to unsnap part of gown for him to access underarms for deodorant application               Lower Body Dressing Details (indicate cue type and reason): Pt already wearing AFOs; anticipate he will need assistance with donning AFOs.   Toilet Transfer Details (indicate cue type and reason): anticipate pt would require MIN-MOD A with RW for t/f to [facility] next to bed.         Functional mobility during ADLs: Minimal assistance;Rolling walker (2 wheels)       Vision         Perception     Praxis  Pertinent Vitals/Pain Pain Assessment Pain Assessment: 0-10 Pain Score:  (unrated; moderate) Pain Location: pt states that he has back and buttocks pain from sitting in the chair. Pain Descriptors / Indicators: Aching Pain Intervention(s): Limited activity within patient's tolerance, Monitored  during session     Hand Dominance     Extremity/Trunk Assessment Upper Extremity Assessment Upper Extremity Assessment: Overall WFL for tasks assessed (Pt with slight ataxia/tremor in BIL UE during functional activity)   Lower Extremity Assessment Lower Extremity Assessment: Defer to PT evaluation (wearing BIL AFOs while transferring) RLE Deficits / Details: hip flexion 4/5, knee flexion/extension 4/5, DF at least 3/5 LLE Deficits / Details: hip flexion 3/5, knee flexion/extension 4-/5, DF at least 2+/5 (pt reports L LE weaker than R LE baseline)       Communication Communication Communication:  (pt is difficult to understand; doesn't enunciate words well.)   Cognition Arousal/Alertness: Awake/alert Behavior During Therapy: WFL for tasks assessed/performed Overall Cognitive Status: Within Functional Limits for tasks assessed                                 General Comments: Oriented; pt slightly tangential, but pleasant mostly; asking OT about school, etc, and also with multiple complaints about food (unhappy with pureed food) and temperature of room (OT turned up heat to try to help).     General Comments  On room air; pt reporting feeling cold, although room warming up quickly when OT changed thermostat at pt request    Exercises     Shoulder Instructions      Home Living Family/patient expects to be discharged to:: Skilled nursing facility                                 Additional Comments: Pt reports being a resident of [facility] for past 3 years.      Prior Functioning/Environment Prior Level of Function : Needs assist             Mobility Comments: Pt reports typically always having assist with mobility (can walk with RW and B AFO's with SBA; sometimes uses w/c and can propel self on own). ADLs Comments: Pt states that he can usually dress bed level, do grooming at the sink; assistance for bathing and transfers/mobility.         OT Problem List: Decreased activity tolerance;Impaired balance (sitting and/or standing)      OT Treatment/Interventions: Self-care/ADL training;Therapeutic exercise;Therapeutic activities;Patient/family education    OT Goals(Current goals can be found in the care plan section) Acute Rehab OT Goals Patient Stated Goal: Get better OT Goal Formulation: With patient Time For Goal Achievement: 11/11/22 Potential to Achieve Goals: Good ADL Goals Pt Will Perform Grooming: with supervision;standing Pt Will Transfer to Toilet: with supervision;bedside commode;ambulating Pt Will Perform Toileting - Clothing Manipulation and hygiene: with supervision;sit to/from stand  OT Frequency: Min 1X/week    Co-evaluation              AM-PAC OT "6 Clicks" Daily Activity     Outcome Measure Help from another person eating meals?: None Help from another person taking care of personal grooming?: A Little Help from another person toileting, which includes using toliet, bedpan, or urinal?: A Little Help from another person bathing (including washing, rinsing, drying)?: A Lot Help from another person to put on and taking off regular upper body  clothing?: None Help from another person to put on and taking off regular lower body clothing?: A Lot 6 Click Score: 18   End of Session Equipment Utilized During Treatment: Gait belt;Rolling walker (2 wheels) Nurse Communication: Mobility status  Activity Tolerance: Patient tolerated treatment well Patient left: in bed;with call bell/phone within reach;with bed alarm set  OT Visit Diagnosis: Unsteadiness on feet (R26.81)                Time: 4098-1191 OT Time Calculation (min): 34 min Charges:  OT General Charges $OT Visit: 1 Visit OT Evaluation $OT Eval Moderate Complexity: 1 Mod OT Treatments $Self Care/Home Management : 8-22 mins  Linward Foster, MS, OTR/L  Alvester Morin 10/28/2022, 1:03 PM

## 2022-10-28 NOTE — Progress Notes (Signed)
[facility] Progress Note  Patient: Philip Gordon    ZOX:[id number]  DOA: 10/26/2022     Date of Service: the patient was seen and examined on 10/28/2022  Chief Complaint  Patient presents with   Anemia   Brief hospital course: ASAAD GULLEY is a 72 y.o. male with medical history significant for multiple sclerosis hypertension, prostate cancer, coming for anemia.  Patient states that he has a blood disorder where the cold is a problem and interferes with blood production. Patient sees hematology.  Patient states he is feeling well otherwise and wants to go home today he receives his transfusion.  Review of systems is negative except for malaise fatigue a little bit of weakness.  Patient is pale appearing.  Patient reports falls but none recently. In the emergency room patient is alert awake oriented afebrile blood work shows hypokalemia of 2.6 AKI of 1.26 EGFR of 60 elevated total bili of 3.3 LDH of 222 reticulocyte count of 3.5, haptoglobin pending.  Anemia with a hemoglobin of 7.4 platelet count 436 normal white count. EKG shows sinus rhythm with PVCs, left anterior fascicular block PR 168 QTc of 425.  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 daily, follow with oncology to gradually taper off as an outpatient Hb 7.4-7.1, transfuse 1 unit PRBC LDH 222 elevated, reticulocyte count 3.5 elevated Repeat H&H, transfuse if hemoglobin less than <7 Hematology consulted 8/4 Hb 7.4, transfuse 1 unit PRBC to keep Hb >8   # PVCs, nonsustained VT, Chronic and stable Cardiology consulted, recommended no plans for AAD or invasive therapies -Continue Toprol-XL 25 mg daily -Mag nl, K has now been repleted. Keep k>4, mg>2 -Last echo with normal EF Follow repeat 2D echocardiogram  # Hypertension, continue metoprolol Monitor BP and titrate medications accordingly  # Depression Continue sertraline and  Remeron   #Hypokalemia, potassium repleted.  Resolved. Monitor electrolytes daily  Body mass index is 21.81 kg/m.  Interventions:  Diet: Dysphagia 1 diet DVT Prophylaxis: SCD, pharmacological prophylaxis contraindicated due to hemolytic anemia    Advance goals of care discussion: Full code  Family Communication: family was not present at bedside, at the time of interview.  The pt provided permission to discuss medical plan with the family. Opportunity was given to ask question and all questions were answered satisfactorily.   Disposition:  Pt is from [facility], admitted with symptomatic anemia, hemoglobin 7.4, still has low Hb, which precludes a safe discharge. [facility]charge to [facility], when stable.  Subjective: No significant events overnight, patient was resting comfortably, denied any chest pain or palpitation, no any other active issues.  Patient agreed for blood transfusion.  Patient did not like the food, requested to change the diet to regular diet.   Physical Exam: General: NAD, lying comfortably Appear in no distress, affect appropriate Eyes: PERRLA ENT: Oral Mucosa Clear, moist  Neck: no JVD,  Cardiovascular: S1 and S2 Present, no Murmur, intermittent PVCs Respiratory: good respiratory effort, Bilateral Air entry equal and Decreased, no Crackles, no wheezes Abdomen: Bowel Sound present, Soft and no tenderness,  Skin: no rashes Extremities: no Pedal edema, no calf tenderness Neurologic: without any new focal findings Gait not checked due to patient safety concerns  Vitals:   10/28/22 0411 10/28/22 0909 10/28/22 1330 10/28/22 1415  BP: (!) 101/54 118/66 129/60 125/62  Pulse: (!) 50 62 72 71  Resp: 16 18 18    Temp: (!) 97.4 F (  36.3 C) 98.2 F (36.8 C) 97.8 F (36.6 C) 97.7 F (36.5 C)  TempSrc: Oral  Oral Oral  SpO2: 100% 100% 98% 98%  Weight:      Height:        Intake/Output Summary (Last 24 hours) at 10/28/2022 1426 Last data filed at 10/27/2022 1600 Gross  per 24 hour  Intake 354 ml  Output 325 ml  Net 29 ml   Filed Weights   10/26/22 1627 10/26/22 2222 10/27/22 0500  Weight: 70.4 kg 66 kg 67 kg    Data Reviewed: I have personally reviewed and interpreted daily labs, tele strips, imagings as discussed above. I reviewed all nursing notes, pharmacy notes, vitals, pertinent old records I have discussed plan of care as described above with RN and patient/family.  CBC: Recent Labs  Lab 10/26/22 1630 10/27/22 0334 10/27/22 1709 10/28/22 0541  WBC 9.9 8.0  --  11.9*  HGB 7.4* 7.1* 7.8* 7.4*  HCT 20.5* 20.0* 21.9* 21.3*  MCV 94.9 97.6  --  95.5  PLT 436* 394  --  376   Basic Metabolic Panel: Recent Labs  Lab 10/26/22 1630 10/27/22 0334 10/28/22 0541  NA 139 142 139  K 2.6* 5.0 3.5  CL 106 113* 110  CO2 25 21* 24  GLUCOSE 96 119* 92  BUN 19 17 18   CREATININE 1.26* 1.09 1.15  CALCIUM 8.7* 8.8* 8.1*  MG  --  1.8 2.3  PHOS  --   --  3.5    Studies: No results found.  Scheduled Meds:  Chlorhexidine Gluconate Cloth  6 each Topical Q0600   melatonin  5 mg Oral QHS   midodrine  2.5 mg Oral TID WC   mirtazapine  15 mg Oral QHS   predniSONE  60 mg Oral Q breakfast   sodium chloride flush  3 mL Intravenous Q12H   tiZANidine  4 mg Oral QHS   Continuous Infusions:  sodium chloride Stopped (10/26/22 1928)   PRN Meds: acetaminophen **OR** acetaminophen, HYDROcodone-acetaminophen, morphine injection  Time spent: 35 minutes  Author: Gillis Santa. MD [facility] 10/28/2022 2:26 PM  To reach On-call, see care teams to locate the attending and reach out to them via [email or web address]. If 7PM-7AM, please contact night-coverage If you still have difficulty reaching the attending provider, please page the [facility] (Director on Call) for [facility] on amion for assistance.

## 2022-10-28 NOTE — Plan of Care (Signed)

## 2022-10-29 ENCOUNTER — Inpatient Hospital Stay (HOSPITAL_COMMUNITY)
Admit: 2022-10-29 | Discharge: 2022-10-29 | Disposition: A | Discharge status: Home / Self Care | Payer: Medicare Other | Attending: Student | Admitting: Student

## 2022-10-29 DIAGNOSIS — I5021 Acute systolic (congestive) heart failure: Secondary | ICD-10-CM | POA: Diagnosis not present

## 2022-10-29 DIAGNOSIS — D5912 Cold autoimmune hemolytic anemia: Secondary | ICD-10-CM | POA: Diagnosis not present

## 2022-10-29 LAB — HEPATIC FUNCTION PANEL
ALT: 17 U/L (ref 0–44)
AST: 19 U/L (ref 15–41)
Albumin: 3 g/dL — ABNORMAL LOW (ref 3.5–5.0)
Alkaline Phosphatase: 62 U/L (ref 38–126)
Bilirubin, Direct: 0.4 mg/dL — ABNORMAL HIGH (ref 0.0–0.2)
Indirect Bilirubin: 1.6 mg/dL — ABNORMAL HIGH (ref 0.3–0.9)
Total Bilirubin: 2 mg/dL — ABNORMAL HIGH (ref 0.3–1.2)
Total Protein: 5.3 g/dL — ABNORMAL LOW (ref 6.5–8.1)

## 2022-10-29 LAB — BASIC METABOLIC PANEL
Anion gap: 6 (ref 5–15)
Anion gap: 9 (ref 5–15)
BUN: 21 mg/dL (ref 8–23)
BUN: 20 mg/dL (ref 8–23)
CO2: 21 mmol/L — ABNORMAL LOW (ref 22–32)
CO2: 20 mmol/L — ABNORMAL LOW (ref 22–32)
Calcium: 8.1 mg/dL — ABNORMAL LOW (ref 8.9–10.3)
Calcium: 8.3 mg/dL — ABNORMAL LOW (ref 8.9–10.3)
Chloride: 110 mmol/L (ref 98–111)
Chloride: 110 mmol/L (ref 98–111)
Creatinine, Ser: 1.13 mg/dL (ref 0.61–1.24)
Creatinine, Ser: 1.22 mg/dL (ref 0.61–1.24)
GFR, Estimated: >60 mL/min (ref >60)
GFR, Estimated: >60 mL/min (ref >60)
Glucose, Bld: 87 mg/dL (ref 70–99)
Glucose, Bld: 128 mg/dL — ABNORMAL HIGH (ref 70–99)
Potassium: 5.9 mmol/L — ABNORMAL HIGH (ref 3.5–5.1)
Potassium: 5.5 mmol/L — ABNORMAL HIGH (ref 3.5–5.1)
Sodium: 137 mmol/L (ref 135–145)
Sodium: 139 mmol/L (ref 135–145)

## 2022-10-29 LAB — ECHOCARDIOGRAM COMPLETE
Height: 69 in
S' Lateral: 3.3 cm
Weight: 2257.51 [oz_av]

## 2022-10-29 LAB — MAGNESIUM: Magnesium: 2.1 mg/dL (ref 1.7–2.4)

## 2022-10-29 LAB — PHOSPHORUS: Phosphorus: 3.4 mg/dL (ref 2.5–4.6)

## 2022-10-29 MED ORDER — SODIUM ZIRCONIUM CYCLOSILICATE 10 G PO PACK
10.0000 g | PACK | Freq: Once | ORAL | Status: AC
  Administered 2022-10-29: 10 g via ORAL
  Filled 2022-10-29: qty 1

## 2022-10-29 NOTE — Progress Notes (Signed)
[facility] Progress Note  Patient: Philip Gordon    UJW:[id number]  DOA: 10/26/2022     Date of Service: the patient was seen and examined on 10/29/2022  Chief Complaint  Patient presents with   Anemia   Brief hospital course: [name] is a 72 y.o. male with medical history significant for multiple sclerosis hypertension, prostate cancer, coming for anemia.  Patient states that he has a blood disorder where the cold is a problem and interferes with blood production. Patient sees hematology.  Patient states he is feeling well otherwise and wants to go home today he receives his transfusion.  Review of systems is negative except for malaise fatigue a little bit of weakness.  Patient is pale appearing.  Patient reports falls but none recently. In the emergency room patient is alert awake oriented afebrile blood work shows hypokalemia of 2.6 AKI of 1.26 EGFR of 60 elevated total bili of 3.3 LDH of 222 reticulocyte count of 3.5, haptoglobin pending.  Anemia with a hemoglobin of 7.4 platelet count 436 normal white count. EKG shows sinus rhythm with PVCs, left anterior fascicular block PR 168 QTc of 425.  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 daily, follow with oncology to gradually taper off as an outpatient Hb 7.4-7.1, transfuse 1 unit PRBC LDH 222 elevated, reticulocyte count 3.5 elevated Repeat H&H, transfuse if hemoglobin less than <7 Hematology consulted 8/4 Hb 7.4, transfuse 1 unit PRBC to keep Hb >8  8/5 Hb 9.9 today  # PVCs, nonsustained VT, Chronic and stable Cardiology consulted, recommended no plans for AAD or invasive therapies -Continue Toprol-XL 25 mg daily -Mag nl, K has now been repleted. Keep k>4, mg>2 -Last echo with normal EF 8/5 TTE EF 55 to 60%, no WMA, no PFO   # Hypertension, continue metoprolol Monitor BP and titrate medications accordingly  #  Depression Continue sertraline and Remeron   #Hypokalemia, potassium repleted.  Resolved. Monitor electrolytes daily  # Hyperkalemia, potassium level is fluctuating Started low potassium diet 8/5K5.5, Lokelma 10 mg x 1 dose order placed  Body mass index is 20.84 kg/m.  Interventions:  Diet: Regular diet DVT Prophylaxis: SCD, pharmacological prophylaxis contraindicated due to hemolytic anemia    Advance goals of care discussion: Full code  Family Communication: family was not present at bedside, at the time of interview.  The pt provided permission to discuss medical plan with the family. Opportunity was given to ask question and all questions were answered satisfactorily.   Disposition:  Pt is from [facility], admitted with symptomatic anemia, s/p 2 unit of PRBC transfusion hemoglobin stable now, patient has fluctuating potassium level, potassium is elevated today, which precludes a safe discharge. Discharge to [facility], when stable.  Most likely tomorrow a.m.  Subjective: No significant events overnight, patient was resting comfortably, denied any chest pain or palpitation, no any other active issues.    Physical Exam: General: NAD, lying comfortably Appear in no distress, affect appropriate Eyes: PERRLA ENT: Oral Mucosa Clear, moist  Neck: no JVD,  Cardiovascular: S1 and S2 Present, no Murmur Respiratory: good respiratory effort, Bilateral Air entry equal and Decreased, no Crackles, no wheezes Abdomen: Bowel Sound present, Soft and no tenderness,  Skin: no rashes Extremities: no Pedal edema, no calf tenderness Neurologic: without any new focal findings Gait not checked due to patient safety concerns  Vitals:   10/29/22 0434 10/29/22 0826 10/29/22 1201 10/29/22 1527  BP: 121/74 (!) 116/52 108/77 117/61  Pulse: 69 (!) 55 66 62  Resp: 20 18  18   Temp: 97.6 F (36.4 C) 97.6 F (36.4 C)  98.2 F (36.8 C)  TempSrc: Oral Oral  Oral  SpO2: 99% 97%  99%  Weight: 64 kg      Height:        Intake/Output Summary (Last 24 hours) at 10/29/2022 1558 Last data filed at 10/29/2022 1039 Gross per 24 hour  Intake 2312.5 ml  Output 300 ml  Net 2012.5 ml   Filed Weights   10/26/22 2222 10/27/22 0500 10/29/22 0434  Weight: 66 kg 67 kg 64 kg    Data Reviewed: I have personally reviewed and interpreted daily labs, tele strips, imagings as discussed above. I reviewed all nursing notes, pharmacy notes, vitals, pertinent old records I have discussed plan of care as described above with RN and patient/family.  CBC: Recent Labs  Lab 10/26/22 1630 10/27/22 0334 10/27/22 1709 10/28/22 0541 10/28/22 1711 10/29/22 0453  WBC 9.9 8.0  --  11.9*  --  12.2*  HGB 7.4* 7.1* 7.8* 7.4* 10.3* 9.9*  HCT 20.5* 20.0* 21.9* 21.3* 21.1* 24.4*  MCV 94.9 97.6  --  95.5  --  103.8*  PLT 436* 394  --  376  --  401*   Basic Metabolic Panel: Recent Labs  Lab 10/26/22 1630 10/27/22 0334 10/28/22 0541 10/29/22 0709 10/29/22 1049  NA 139 142 139 137 139  K 2.6* 5.0 3.5 5.9* 5.5*  CL 106 113* 110 110 110  CO2 25 21* 24 21* 20*  GLUCOSE 96 119* 92 87 128*  BUN 19 17 18 21 20   CREATININE 1.26* 1.09 1.15 1.13 1.22  CALCIUM 8.7* 8.8* 8.1* 8.1* 8.3*  MG  --  1.8 2.3 2.1  --   PHOS  --   --  3.5 3.4  --     Studies: ECHOCARDIOGRAM COMPLETE  Result Date: 10/29/2022    ECHOCARDIOGRAM REPORT   Patient Name:   Philip Gordon Date of Exam: 10/29/2022 Medical Rec #:  [id number]          Height:       69.0 in Accession #:    [id number]         Weight:       141.1 lb Date of Birth:  February 08, 1951           BSA:          1.781 m Patient Age:    72 years           BP:           121/74 mmHg Patient Gender: M                  HR:           69 bpm. Exam Location:  [facility] Procedure: 2D Echo, Cardiac Doppler and Color Doppler Indications:     CHF-acute systolic I50.21  History:         Patient has prior history of Echocardiogram examinations, most                  recent 04/16/2021. Risk  Factors:Hypertension.  Sonographer:     Cristela Blue Referring Phys:  Gillis Santa Diagnosing Phys: Yvonne Kendall MD IMPRESSIONS  1. Left ventricular ejection fraction, by estimation, is 55 to 60%. The left ventricle has normal function. The left ventricle has no regional wall motion abnormalities. Left ventricular diastolic parameters were  normal.  2. Right ventricular systolic function is normal. The right ventricular size is normal.  3. The mitral valve is normal in structure. Mild mitral valve regurgitation. No evidence of mitral stenosis.  4. The aortic valve is tricuspid. There is mild thickening of the aortic valve. Aortic valve regurgitation is trivial. Aortic valve sclerosis is present, with no evidence of aortic valve stenosis.  5. The inferior vena cava is normal in size with <50% respiratory variability, suggesting right atrial pressure of 8 mmHg. FINDINGS  Left Ventricle: Left ventricular ejection fraction, by estimation, is 55 to 60%. The left ventricle has normal function. The left ventricle has no regional wall motion abnormalities. The left ventricular internal cavity size was normal in size. There is  no left ventricular hypertrophy. Left ventricular diastolic parameters were normal. Right Ventricle: The right ventricular size is normal. No increase in right ventricular wall thickness. Right ventricular systolic function is normal. Left Atrium: Left atrial size was normal in size. Right Atrium: Right atrial size was normal in size. Pericardium: There is no evidence of pericardial effusion. Mitral Valve: The mitral valve is normal in structure. Mild mitral valve regurgitation. No evidence of mitral valve stenosis. Tricuspid Valve: The tricuspid valve is grossly normal. Tricuspid valve regurgitation is trivial. Aortic Valve: The aortic valve is tricuspid. There is mild thickening of the aortic valve. Aortic valve regurgitation is trivial. Aortic valve sclerosis is present, with no evidence of aortic  valve stenosis. Pulmonic Valve: The pulmonic valve was normal in structure. Pulmonic valve regurgitation is not visualized. No evidence of pulmonic stenosis. Aorta: The aortic root is normal in size and structure. Pulmonary Artery: The pulmonary artery is of normal size. Venous: The inferior vena cava is normal in size with less than 50% respiratory variability, suggesting right atrial pressure of 8 mmHg. IAS/Shunts: No atrial level shunt detected by color flow Doppler.  LEFT VENTRICLE PLAX 2D LVIDd:         4.80 cm LVIDs:         3.30 cm LV PW:         0.90 cm LV IVS:        0.90 cm LVOT diam:     2.00 cm LV SV:         63 LV SV Index:   35 LVOT Area:     3.14 cm  LEFT ATRIUM           Index        RIGHT ATRIUM LA diam:      1.60 cm 0.90 cm/m   RA Pressure: 25.00 mmHg LA Vol (A2C): 27.8 ml 15.61 ml/m LA Vol (A4C): 13.9 ml 7.80 ml/m  AORTIC VALVE LVOT Vmax:   77.00 cm/s LVOT VTI:    0.200 m  AORTA Ao Root diam: 2.79 cm TRICUSPID VALVE Estimated RAP:  25.00 mmHg  SHUNTS Systemic VTI:  0.20 m Systemic Diam: 2.00 cm Yvonne Kendall MD Electronically signed by Yvonne Kendall MD Signature Date/Time: 10/29/2022/12:22:45 PM    Final     Scheduled Meds:  Chlorhexidine Gluconate Cloth  6 each Topical Q0600   melatonin  5 mg Oral QHS   midodrine  2.5 mg Oral TID WC   mirtazapine  15 mg Oral QHS   predniSONE  60 mg Oral Q breakfast   sodium chloride flush  3 mL Intravenous Q12H   sodium zirconium cyclosilicate  10 g Oral Once   tiZANidine  4 mg Oral QHS   Continuous Infusions:  sodium chloride  Stopped (10/26/22 1928)   PRN Meds: acetaminophen **OR** acetaminophen, HYDROcodone-acetaminophen, morphine injection  Time spent: 35 minutes  Author: Gillis Santa. MD [facility] 10/29/2022 3:58 PM  To reach On-call, see care teams to locate the attending and reach out to them via [email or web address]. If 7PM-7AM, please contact night-coverage If you still have difficulty reaching the attending provider, please  page the [facility] (Director on Call) for [facility] on amion for assistance.

## 2022-10-29 NOTE — Progress Notes (Signed)
*  PRELIMINARY RESULTS* Echocardiogram 2D Echocardiogram has been performed.  Philip Gordon 10/29/2022, 9:56 AM

## 2022-10-29 NOTE — Plan of Care (Signed)

## 2022-10-29 NOTE — Consult Note (Signed)
Hematology/Oncology Consult note [facility] [phone] [phone]  Patient Care Team: Rosetta Posner, MD as PCP - General (Geriatric Medicine) Debbe Odea, MD as PCP - Cardiology (Cardiology) Rosetta Posner, MD as Referring Physician (Geriatric Medicine) Ivery Quale, NP as Nurse Practitioner (Nurse Practitioner)   Name of the patient: Philip Gordon  [id number]  [birth year]    Reason for consult: History of cold agglutinin hemolytic anemia   Requesting physician: Dr. Gillis Santa  Date of visit: 10/29/2022    History of presenting illness- Patient is a 72 year old male with history of cold agglutinin hemolytic anemia who was last seen by me as an outpatient in September 2019.  His hemoglobin at that time was stable around 10 and he has never required any treatment for cold agglutinin disease.  He has had a workup for cold agglutinin including negative ANA, normal C3-C4 as well as negative hepatitis B and C testing.  Myeloma panel did not reveal any M protein.  Rheumatoid factor testing at that time was negative.  Subsequently he was being followed by his primary care doctor and recently he was admitted to the hospital for low hemoglobin.  His hemoglobin which was 9.1 a year ago had dropped down to 7.1 this year.  He was started on 60 mg of prednisone for hemolytic anemia.  Reticulocyte count was elevated at 12.4.  Haptoglobin less than 10 LDH 222 and predominant indirect hyperbilirubinemia with a total bilirubin of 3.3 on admission and is presently at 2.0.  Patient reports ongoing fatigue.  States that it has been difficult for him to walk due to fatigue  ECOG PS- 3  Pain scale- 0   Review of systems- Review of Systems  Constitutional:  Positive for malaise/fatigue. Negative for chills, fever and weight loss.  HENT:  Negative for congestion, ear discharge and nosebleeds.   Eyes:  Negative for blurred vision.  Respiratory:   Negative for cough, hemoptysis, sputum production, shortness of breath and wheezing.   Cardiovascular:  Negative for chest pain, palpitations, orthopnea and claudication.  Gastrointestinal:  Negative for abdominal pain, blood in stool, constipation, diarrhea, heartburn, melena, nausea and vomiting.  Genitourinary:  Negative for dysuria, flank pain, frequency, hematuria and urgency.  Musculoskeletal:  Negative for back pain, joint pain and myalgias.  Skin:  Negative for rash.  Neurological:  Negative for dizziness, tingling, focal weakness, seizures, weakness and headaches.  Endo/Heme/Allergies:  Does not bruise/bleed easily.  Psychiatric/Behavioral:  Negative for depression and suicidal ideas. The patient does not have insomnia.     Allergies  Allergen Reactions   Gabapentin     Other reaction(s): Dizziness   Pregabalin     Other reaction(s): Dizziness    Patient Active Problem List   Diagnosis Date Noted   Symptomatic anemia 10/27/2022   NSVT (nonsustained ventricular tachycardia) (HCC) 10/27/2022   PVC (premature ventricular contraction) 10/27/2022   Hypokalemia 10/26/2022   Hyperkalemia 10/25/2021   Sinus bradycardia 10/25/2021   Depression    Bigeminy    Chronic back pain 09/28/2020   Autoimmune hemolytic anemia (HCC) 04/18/2019   Fall 01/23/2019   Weakness 01/21/2019   AKI (acute kidney injury) (HCC) 01/21/2019   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HCC) 09/21/2017   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) 11/22/2016   Bilateral foot-drop 02/04/2015   Sinus infection 11/15/2014   Right mandibular swelling 11/15/2014   Brain lesion 01/17/2014   Obstruction of urinary tract 10/15/2012   Bladder outlet obstruction 10/15/2012   Disorder of bladder function  01/16/2012   Incomplete bladder emptying 01/16/2012   History of prostate cancer 01/16/2012   DS (disseminated sclerosis) (HCC) 01/16/2012   Hypertension 12/28/2011   Hypercholesteremia 12/28/2011   Multiple sclerosis (HCC)  12/28/2011   B12 deficiency 12/28/2011   Secondary progressive multiple sclerosis (HCC) 12/28/2011   Acquired ankle/foot deformity 12/07/2010     Past Medical History:  Diagnosis Date   Depression    Hypercholesterolemia    Hypertension    Multiple sclerosis (HCC)    Sees [facility])   Prostate cancer [facility])    Followed by Dr Achilles Dunk and Dr Doylene Canning     Past Surgical History:  Procedure Laterality Date   KNEE SURGERY      Social History   Socioeconomic History   Marital status: Divorced    Spouse name: Not on file   Number of children: Not on file   Years of education: Not on file   Highest education level: Not on file  Occupational History   Not on file  Tobacco Use   Smoking status: Never   Smokeless tobacco: Current    Types: Chew   Tobacco comments:    Is going to try and cut down on the amount.   Vaping Use   Vaping status: Never Used  Substance and Sexual Activity   Alcohol use: No    Alcohol/week: 2.0 standard drinks of alcohol    Types: 2 Cans of beer per week   Drug use: No   Sexual activity: Not Currently  Other Topics Concern   Not on file  Social History Narrative   Not on file   Social Determinants of Health   Financial Resource Strain: Not on file  Food Insecurity: No Food Insecurity (10/26/2022)   Hunger Vital Sign    Worried About Running Out of Food in the Last Year: Never true    Ran Out of Food in the Last Year: Never true  Transportation Needs: No Transportation Needs (10/26/2022)   PRAPARE - Administrator, Civil Service (Medical): No    Lack of Transportation (Non-Medical): No  Physical Activity: Not on file  Stress: Not on file  Social Connections: Not on file  Intimate Partner Violence: Not At Risk (10/26/2022)   Humiliation, Afraid, Rape, and Kick questionnaire    Fear of Current or Ex-Partner: No    Emotionally Abused: No    Physically Abused: No    Sexually Abused: No     Family History  Problem  Relation Age of Onset   Hypertension Mother    Stroke Father    Diabetes Maternal Grandmother    Diabetes Maternal Grandfather    Leukemia Sister        died - age 33     Current Facility-Administered Medications:    0.9 %  sodium chloride infusion, 10 mL/hr, Intravenous, Once, Gertha Calkin, MD, Held at 10/26/22 1928   acetaminophen (TYLENOL) tablet 650 mg, 650 mg, Oral, Q6H PRN **OR** acetaminophen (TYLENOL) suppository 650 mg, 650 mg, Rectal, Q6H PRN, Gertha Calkin, MD   Chlorhexidine Gluconate Cloth 2 % PADS 6 each, 6 each, Topical, Q0600, Gertha Calkin, MD, 6 each at 10/26/22 2215   HYDROcodone-acetaminophen (NORCO/VICODIN) 5-325 MG per tablet 1 tablet, 1 tablet, Oral, Q4H PRN, Gertha Calkin, MD   melatonin tablet 5 mg, 5 mg, Oral, QHS, Lindajo Royal V, MD, 5 mg at 10/28/22 2208   midodrine (PROAMATINE) tablet 2.5 mg, 2.5 mg, Oral, TID WC, Lucianne Muss,  Dileep, MD, 2.5 mg at 10/29/22 1159   mirtazapine (REMERON) tablet 15 mg, 15 mg, Oral, QHS, Andris Baumann, MD, 15 mg at 10/28/22 2208   morphine (PF) 2 MG/ML injection 2 mg, 2 mg, Intravenous, Q4H PRN, Gertha Calkin, MD   predniSONE (DELTASONE) tablet 60 mg, 60 mg, Oral, Q breakfast, Irena Cords V, MD, 60 mg at 10/29/22 8413   sodium chloride flush (NS) 0.9 % injection 3 mL, 3 mL, Intravenous, Q12H, Irena Cords V, MD, 3 mL at 10/29/22 2440   sodium zirconium cyclosilicate (LOKELMA) packet 10 g, 10 g, Oral, Once, Gillis Santa, MD   tiZANidine (ZANAFLEX) tablet 4 mg, 4 mg, Oral, QHS, Andris Baumann, MD, 4 mg at 10/28/22 2208   Physical exam:  Vitals:   10/29/22 0434 10/29/22 0826 10/29/22 1201 10/29/22 1527  BP: 121/74 (!) 116/52 108/77 117/61  Pulse: 69 (!) 55 66 62  Resp: 20 18  18   Temp: 97.6 F (36.4 C) 97.6 F (36.4 C)  98.2 F (36.8 C)  TempSrc: Oral Oral  Oral  SpO2: 99% 97%  99%  Weight: 141 lb 1.5 oz (64 kg)     Height:       Physical Exam Cardiovascular:     Rate and Rhythm: Normal rate and regular rhythm.     Heart  sounds: Normal heart sounds.  Pulmonary:     Effort: Pulmonary effort is normal.     Breath sounds: Normal breath sounds.  Abdominal:     General: Bowel sounds are normal.     Palpations: Abdomen is soft.     Comments: No palpable hepatosplenomegaly  Musculoskeletal:     Right lower leg: No edema.     Left lower leg: No edema.  Skin:    General: Skin is warm and dry.  Neurological:     Mental Status: He is alert and oriented to person, place, and time.           Latest Ref Rng & Units 10/29/2022   10:49 AM  CMP  Glucose 70 - 99 mg/dL 102   BUN 8 - 23 mg/dL 20   Creatinine 7.25 - 1.24 mg/dL 3.66   Sodium 440 - 347 mmol/L 139   Potassium 3.5 - 5.1 mmol/L 5.5   Chloride 98 - 111 mmol/L 110   CO2 22 - 32 mmol/L 20   Calcium 8.9 - 10.3 mg/dL 8.3       Latest Ref Rng & Units 10/29/2022    4:53 AM  CBC  WBC 4.0 - 10.5 K/uL 12.2   Hemoglobin 13.0 - 17.0 g/dL 9.9   Hematocrit 42.5 - 52.0 % 24.4   Platelets 150 - 400 K/uL 401     @IMAGES @  ECHOCARDIOGRAM COMPLETE  Result Date: 10/29/2022    ECHOCARDIOGRAM REPORT   Patient Name:   NESANEL NYKAZA Date of Exam: 10/29/2022 Medical Rec #:  [id number]          Height:       69.0 in Accession #:    [id number]         Weight:       141.1 lb Date of Birth:  [birth year]           BSA:          1.781 m Patient Age:    72 years           BP:           121/74 mmHg Patient Gender: M  HR:           69 bpm. Exam Location:  [facility] Procedure: 2D Echo, Cardiac Doppler and Color Doppler Indications:     CHF-acute systolic I50.21  History:         Patient has prior history of Echocardiogram examinations, most                  recent 04/16/2021. Risk Factors:Hypertension.  Sonographer:     Cristela Blue Referring Phys:  Gillis Santa Diagnosing Phys: Yvonne Kendall MD IMPRESSIONS  1. Left ventricular ejection fraction, by estimation, is 55 to 60%. The left ventricle has normal function. The left ventricle has no regional wall motion  abnormalities. Left ventricular diastolic parameters were normal.  2. Right ventricular systolic function is normal. The right ventricular size is normal.  3. The mitral valve is normal in structure. Mild mitral valve regurgitation. No evidence of mitral stenosis.  4. The aortic valve is tricuspid. There is mild thickening of the aortic valve. Aortic valve regurgitation is trivial. Aortic valve sclerosis is present, with no evidence of aortic valve stenosis.  5. The inferior vena cava is normal in size with <50% respiratory variability, suggesting right atrial pressure of 8 mmHg. FINDINGS  Left Ventricle: Left ventricular ejection fraction, by estimation, is 55 to 60%. The left ventricle has normal function. The left ventricle has no regional wall motion abnormalities. The left ventricular internal cavity size was normal in size. There is  no left ventricular hypertrophy. Left ventricular diastolic parameters were normal. Right Ventricle: The right ventricular size is normal. No increase in right ventricular wall thickness. Right ventricular systolic function is normal. Left Atrium: Left atrial size was normal in size. Right Atrium: Right atrial size was normal in size. Pericardium: There is no evidence of pericardial effusion. Mitral Valve: The mitral valve is normal in structure. Mild mitral valve regurgitation. No evidence of mitral valve stenosis. Tricuspid Valve: The tricuspid valve is grossly normal. Tricuspid valve regurgitation is trivial. Aortic Valve: The aortic valve is tricuspid. There is mild thickening of the aortic valve. Aortic valve regurgitation is trivial. Aortic valve sclerosis is present, with no evidence of aortic valve stenosis. Pulmonic Valve: The pulmonic valve was normal in structure. Pulmonic valve regurgitation is not visualized. No evidence of pulmonic stenosis. Aorta: The aortic root is normal in size and structure. Pulmonary Artery: The pulmonary artery is of normal size. Venous: The  inferior vena cava is normal in size with less than 50% respiratory variability, suggesting right atrial pressure of 8 mmHg. IAS/Shunts: No atrial level shunt detected by color flow Doppler.  LEFT VENTRICLE PLAX 2D LVIDd:         4.80 cm LVIDs:         3.30 cm LV PW:         0.90 cm LV IVS:        0.90 cm LVOT diam:     2.00 cm LV SV:         63 LV SV Index:   35 LVOT Area:     3.14 cm  LEFT ATRIUM           Index        RIGHT ATRIUM LA diam:      1.60 cm 0.90 cm/m   RA Pressure: 25.00 mmHg LA Vol (A2C): 27.8 ml 15.61 ml/m LA Vol (A4C): 13.9 ml 7.80 ml/m  AORTIC VALVE LVOT Vmax:   77.00 cm/s LVOT VTI:    0.200 m  AORTA  Ao Root diam: 2.79 cm TRICUSPID VALVE Estimated RAP:  25.00 mmHg  SHUNTS Systemic VTI:  0.20 m Systemic Diam: 2.00 cm Yvonne Kendall MD Electronically signed by Yvonne Kendall MD Signature Date/Time: 10/29/2022/12:22:45 PM    Final     Assessment and plan- Patient is a 72 y.o. male with history of cold agglutinin hemolytic anemia admitted for worsening anemia  Patient has not seen me since 2019 when his hemoglobin was between 9-10.  His cold agglutinin hemolytic anemia has not required any treatment in the past.  Presently his labs are suggestive of hemolysis including an undetectable haptoglobin, predominantly indirect hyperbilirubinemia as well as mildly elevated LDH and reticulocyte count.  However it is unclear if this is more of an acute process or chronic hemolysis since he has always had an element of indirect hyperbilirubinemia and undetectable haptoglobin even when he was under observation in 2019.  At this time I am repeating his Coombs test as well as cold agglutinin titer and other workup for his anemia including ferritin and iron studies B12 folate.  Patient received a unit of PRBC transfusion yesterday and therefore it is unclear if the hemoglobin is improved because of transfusion versus prednisone.  Typically high-dose prednisone does not help patients with cold agglutinin  hemolytic anemia.  Okay for patient to stay on 60 mg of prednisone until results of Coombs test are back.  If Coombs test indicates cold agglutinin disease I would recommend stopping prednisone and focusing on keeping him warm to prevent further hemolysis.  It is unclear if patient developed any viral infection which may have triggered his acute on chronic anemia and worsening hemolysis.  I will continue to follow-up with the patient in my clinic upon discharge     Visit Diagnosis 1. Anemia, unspecified type   2. Hypokalemia     Dr. Owens Shark, MD, MPH [facility] at [facility] [phone] 10/29/2022

## 2022-10-29 NOTE — Progress Notes (Signed)
Physical Therapy Treatment Patient Details Name: Philip Gordon MRN: [id number] DOB: 1950-08-15 Today's Date: 10/29/2022   History of Present Illness Pt is a 72 y.o. male presenting to hospital 10/26/22 with concerns for low Hgb; increased fatigue/weakness.  Pt admitted with hemolytic anemia d/t cold antibody.  PMH includes MS, htn, prostate CA, knee sx, PVC's.    PT Comments  Pt resting in bed upon PT arrival; agreeable to therapy.  During session pt mod assist with bed mobility and 2 assist to stand up to RW (x2 trials).  Pt appearing weaker today in general requiring increased assist to stand and pt was not able to take any steps with RW use and 2 assist.  Nurse updated on pt's status.  Will continue to focus on strengthening, balance, and progressive functional mobility per pt tolerance.    If plan is discharge home, recommend the following: A lot of help with bathing/dressing/bathroom;Assist for transportation;Help with stairs or ramp for entrance;Two people to help with walking and/or transfers   Can travel by private vehicle     No  Equipment Recommendations  None recommended by PT (pt reports already having RW and manual w/c)    Recommendations for Other Services       Precautions / Restrictions Precautions Precautions: Fall Restrictions Weight Bearing Restrictions: No Other Position/Activity Restrictions: B AFO's     Mobility  Bed Mobility Overal bed mobility: Needs Assistance Bed Mobility: Supine to Sit, Sit to Supine     Supine to sit: Mod assist, HOB elevated (assist for trunk) Sit to supine: Mod assist, HOB elevated (assist for B LE's)   General bed mobility comments: vc's for technique    Transfers Overall transfer level: Needs assistance Equipment used: Rolling walker (2 wheels) Transfers: Sit to/from Stand Sit to Stand: Max assist, Min assist, +2 physical assistance           General transfer comment: max assist x1 plus min assist of 2nd to stand (x2  trials) from bed; vc's for UE/LE placement and overall technique; assist to initiate stand and control descent sitting    Ambulation/Gait               General Gait Details: pt unable to take any steps with 2 assist, max cues, and assist with weightshifting   Stairs             Wheelchair Mobility     Tilt Bed    Modified Rankin (Stroke Patients Only)       Balance Overall balance assessment: Needs assistance Sitting-balance support: No upper extremity supported, Feet supported Sitting balance-Leahy Scale: Good Sitting balance - Comments: steady reaching within BOS   Standing balance support: Bilateral upper extremity supported, Reliant on assistive device for balance Standing balance-Leahy Scale: Poor Standing balance comment: assist with static standing with B UE support on RW                            Cognition Arousal/Alertness: Awake/alert Behavior During Therapy: WFL for tasks assessed/performed Overall Cognitive Status: Within Functional Limits for tasks assessed                                          Exercises      General Comments  Nursing cleared pt for participation in physical therapy.  Pt agreeable to PT session.  Discussed  pt's elevated K+ with MD Kumar--MD cleared pt to participate in therapy.      Pertinent Vitals/Pain Pain Assessment Pain Assessment: No/denies pain Pain Intervention(s): Limited activity within patient's tolerance, Monitored during session, Repositioned Vitals (HR and SpO2 on room air) stable and WFL throughout treatment session.    Home Living                          Prior Function            PT Goals (current goals can now be found in the care plan section) Acute Rehab PT Goals Patient Stated Goal: to improve strength and mobility PT Goal Formulation: With patient Time For Goal Achievement: 11/11/22 Potential to Achieve Goals: Good Progress towards PT goals:  Progressing toward goals    Frequency    Min 1X/week      PT Plan Current plan remains appropriate    Co-evaluation              AM-PAC PT "6 Clicks" Mobility   Outcome Measure  Help needed turning from your back to your side while in a flat bed without using bedrails?: A Little Help needed moving from lying on your back to sitting on the side of a flat bed without using bedrails?: A Lot Help needed moving to and from a bed to a chair (including a wheelchair)?: Total Help needed standing up from a chair using your arms (e.g., wheelchair or bedside chair)?: Total Help needed to walk in hospital room?: Total Help needed climbing 3-5 steps with a railing? : Total 6 Click Score: 9    End of Session Equipment Utilized During Treatment: Gait belt Activity Tolerance: Patient limited by fatigue Patient left: in bed;with call bell/phone within reach;with bed alarm set Nurse Communication: Mobility status;Precautions;Other (comment) (increased weakness/assist levels) PT Visit Diagnosis: Other abnormalities of gait and mobility (R26.89);Muscle weakness (generalized) (M62.81)     Time: 1610-9604 PT Time Calculation (min) (ACUTE ONLY): 24 min  Charges:    $Therapeutic Activity: 23-37 mins PT General Charges $$ ACUTE PT VISIT: 1 Visit                     Hendricks Limes, PT 10/29/22, 5:50 PM

## 2022-10-29 NOTE — TOC Initial Note (Addendum)
Transition of Care [facility]) - Initial/Assessment Note    Patient Details  Name: Philip Gordon MRN: [id number] Date of Birth: 12-25-1950  [facility] CM/SW Contact:    Truddie Hidden, RN Phone Number: 10/29/2022, 3:35 PM  Clinical Narrative:                 Patient admitted from [facility].  Attempt to reach Tammy in admissions regarding patient's return. No answer. Message sent regarding status of placement.   3:40pm Retrieved message from Tammy stating patient was there for LTC.         Patient Goals and CMS Choice            Expected Discharge Plan and Services                                              Prior Living Arrangements/Services                       Activities of Daily Living Home Assistive Devices/Equipment: Wheelchair ADL Screening (condition at time of admission) Patient's cognitive ability adequate to safely complete daily activities?: No Is the patient deaf or have difficulty hearing?: No Does the patient have difficulty seeing, even when wearing glasses/contacts?: No Does the patient have difficulty concentrating, remembering, or making decisions?: Yes Patient able to express need for assistance with ADLs?: Yes Does the patient have difficulty dressing or bathing?: Yes Independently performs ADLs?: No Does the patient have difficulty walking or climbing stairs?: Yes Weakness of Legs: Both Weakness of Arms/Hands: Both  Permission Sought/Granted                  Emotional Assessment              Admission diagnosis:  Hypokalemia [E87.6] 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) [D59.12] Anemia, unspecified type [D64.9] Symptomatic anemia [D64.9] Patient Active Problem List   Diagnosis Date Noted   Symptomatic anemia 10/27/2022   NSVT (nonsustained ventricular tachycardia) (HCC) 10/27/2022   PVC (premature ventricular contraction) 10/27/2022   Hypokalemia 10/26/2022   Hyperkalemia  10/25/2021   Sinus bradycardia 10/25/2021   Depression    Bigeminy    Chronic back pain 09/28/2020   Autoimmune hemolytic anemia (HCC) 04/18/2019   Fall 01/23/2019   Weakness 01/21/2019   AKI (acute kidney injury) (HCC) 01/21/2019   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HCC) 09/21/2017   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) 11/22/2016   Bilateral foot-drop 02/04/2015   Sinus infection 11/15/2014   Right mandibular swelling 11/15/2014   Brain lesion 01/17/2014   Obstruction of urinary tract 10/15/2012   Bladder outlet obstruction 10/15/2012   Disorder of bladder function 01/16/2012   Incomplete bladder emptying 01/16/2012   History of prostate cancer 01/16/2012   DS (disseminated sclerosis) (HCC) 01/16/2012   Hypertension 12/28/2011   Hypercholesteremia 12/28/2011   Multiple sclerosis (HCC) 12/28/2011   B12 deficiency 12/28/2011   Secondary progressive multiple sclerosis (HCC) 12/28/2011   Acquired ankle/foot deformity 12/07/2010   PCP:  Rosetta Posner, MD Pharmacy:   [facility] [phone] [place], [place] - [address] [place] [address] Phone: [phone] Fax: [phone]  [facility] - [place], [place] - [address] [place] [address] Phone: [phone] Fax: [phone]  Social Determinants of Health (SDOH) Social History: SDOH Screenings   Food Insecurity: No Food Insecurity (10/26/2022)  Housing: Low Risk  (10/26/2022)  Transportation Needs: No Transportation Needs (10/26/2022)  Utilities: Not At Risk (10/26/2022)  Depression (PHQ2-9): Low Risk  (04/24/2019)  Tobacco Use: High Risk (10/26/2022)   SDOH Interventions:     Readmission Risk Interventions     No data to display

## 2022-10-30 DIAGNOSIS — D5912 Cold autoimmune hemolytic anemia: Secondary | ICD-10-CM | POA: Diagnosis not present

## 2022-10-30 LAB — CBC
HCT: UNDETERMINED % (ref 39.0–52.0)
HCT: 26 % — ABNORMAL LOW (ref 39.0–52.0)
Hemoglobin: 10.2 g/dL — ABNORMAL LOW (ref 13.0–17.0)
Hemoglobin: 9.6 g/dL — ABNORMAL LOW (ref 13.0–17.0)
MCH: UNDETERMINED pg (ref 26.0–34.0)
MCH: 35.2 pg — ABNORMAL HIGH (ref 26.0–34.0)
MCHC: UNDETERMINED g/dL (ref 30.0–36.0)
MCHC: 36.9 g/dL — ABNORMAL HIGH (ref 30.0–36.0)
MCV: UNDETERMINED fL (ref 80.0–100.0)
MCV: 95.2 fL (ref 80.0–100.0)
Platelets: 420 10*3/uL — ABNORMAL HIGH (ref 150–400)
Platelets: 305 10*3/uL (ref 150–400)
RBC: UNDETERMINED MIL/uL (ref 4.22–5.81)
RBC: 2.73 MIL/uL — ABNORMAL LOW (ref 4.22–5.81)
RDW: UNDETERMINED % (ref 11.5–15.5)
RDW: 16.6 % — ABNORMAL HIGH (ref 11.5–15.5)
WBC: 10.3 10*3/uL (ref 4.0–10.5)
WBC: 10 10*3/uL (ref 4.0–10.5)
nRBC: UNDETERMINED % (ref 0.0–0.2)
nRBC: 0 % (ref 0.0–0.2)

## 2022-10-30 LAB — BASIC METABOLIC PANEL
Anion gap: 5 (ref 5–15)
BUN: 22 mg/dL (ref 8–23)
CO2: 23 mmol/L (ref 22–32)
Calcium: 8.2 mg/dL — ABNORMAL LOW (ref 8.9–10.3)
Chloride: 105 mmol/L (ref 98–111)
Creatinine, Ser: 1.18 mg/dL (ref 0.61–1.24)
GFR, Estimated: >60 mL/min (ref >60)
Glucose, Bld: 117 mg/dL — ABNORMAL HIGH (ref 70–99)
Potassium: 4.1 mmol/L (ref 3.5–5.1)
Sodium: 133 mmol/L — ABNORMAL LOW (ref 135–145)

## 2022-10-30 LAB — MAGNESIUM: Magnesium: 2.3 mg/dL (ref 1.7–2.4)

## 2022-10-30 MED ORDER — PREDNISONE 20 MG PO TABS
50.0000 mg | ORAL_TABLET | Freq: Once | ORAL | Status: AC
  Administered 2022-10-30: 50 mg via ORAL
  Filled 2022-10-30: qty 1

## 2022-10-30 MED ORDER — MIDODRINE HCL 5 MG PO TABS: 5.0000 mg | ORAL_TABLET | Freq: Three times a day (TID) | ORAL | Status: AC | PRN

## 2022-10-30 MED ORDER — PREDNISONE 20 MG PO TABS: ORAL_TABLET | ORAL | Status: AC

## 2022-10-30 NOTE — TOC Transition Note (Signed)
Transition of Care [facility]) - CM/SW Discharge Note   Patient Details  Name: Philip Gordon MRN: [id number] Date of Birth: [birth year]  Transition of Care [facility]) CM/SW Contact:  Truddie Hidden, RN Phone Number: 10/30/2022, 12:46 PM   Clinical Narrative:    Spoke with Tammy in admissions at [facility]. Facility will coordinate to get auth from [employer]. Patient will be accepted today.  He has been assigned room 507A Nurse will call report to [phone] Face sheet and medical necessity forms printed to the floor to be added to the EMS pack EMS arranged  Discharge summary and SNF transfer report sent in HUB.  TOC signing off.          Patient Goals and CMS Choice      Discharge Placement                         Discharge Plan and Services Additional resources added to the After Visit Summary for                                       Social Determinants of Health (SDOH) Interventions SDOH Screenings   Food Insecurity: No Food Insecurity (10/26/2022)  Housing: Low Risk  (10/26/2022)  Transportation Needs: No Transportation Needs (10/26/2022)  Utilities: Not At Risk (10/26/2022)  Depression (PHQ2-9): Low Risk  (04/24/2019)  Tobacco Use: High Risk (10/26/2022)     Readmission Risk Interventions     No data to display

## 2022-10-30 NOTE — Care Management Important Message (Signed)
Important Message  Patient Details  Name: Philip Gordon MRN: [id number] Date of Birth: 11-15-1950   Medicare Important Message Given:  N/A - LOS <3 / Initial given by admissions     Johnell Comings 10/30/2022, 1:26 PM

## 2022-10-30 NOTE — Progress Notes (Signed)
       CROSS COVER NOTE  NAME: Philip Gordon MRN: [id number] DOB : 08-03-1950    Concern as stated by nurse / staff   Pt in Rm 225 had a 2.37 sec. pause on the monitor. 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 Pt Hem stable at 9.9, K+ was 5.5 today 8/5, Lokelma 10 mg given      Pertinent findings on chart review: Today's progress note reviewed: Patient has had nonsustained V. tach and is currently on Toprol and has been seen by cardiology.  Per progress note from today: # PVCs, nonsustained VT, Chronic and stable Cardiology consulted, recommended no plans for AAD or invasive therapies -Continue Toprol-XL 25 mg daily -Mag nl, K has now been repleted. Keep k>4, mg>2 -Last echo with normal EF 8/5 TTE EF 55 to 60%, no WMA, no PFO     10/29/2022    7:47 PM 10/29/2022    3:27 PM 10/29/2022   12:01 PM  Vitals with BMI  Systolic 132 117 644  Diastolic 54 61 77  Pulse  62 66      Latest Ref Rng & Units 10/29/2022   10:49 AM 10/29/2022    7:09 AM 10/28/2022    5:41 AM  BMP  Glucose 70 - 99 mg/dL 034  87  92   BUN 8 - 23 mg/dL 20  21  18    Creatinine 0.61 - 1.24 mg/dL 7.42  5.95  6.38   Sodium 135 - 145 mmol/L 139  137  139   Potassium 3.5 - 5.1 mmol/L 5.5  5.9  3.5   Chloride 98 - 111 mmol/L 110  110  110   CO2 22 - 32 mmol/L 20  21  24    Calcium 8.9 - 10.3 mg/dL 8.3  8.1  8.1      Assessment and  Interventions   Assessment:  Sinus pause, asymptomatic Recent nonsustained V. tach on Toprol Hyperkalemia  Plan: Will hold Toprol pending cardiology reconsult in the a.m. Check mag to ensure> 2 and potassium> 4 but within range Repeat K and mag ordered Continue telemetry X

## 2022-10-30 NOTE — TOC Progression Note (Signed)
Transition of Care [facility]) - Progression Note    Patient Details  Name: Philip Gordon MRN: [id number] Date of Birth: [birth year]  Transition of Care [facility]) CM/SW Contact  Truddie Hidden, RN Phone Number: 10/30/2022, 11:23 AM  Clinical Narrative:    Message sent to Lavonne Chick to confirm patient discharge back to facility. Per Tammy patient will be accepted today.         Expected Discharge Plan and Services         Expected Discharge Date: 10/30/22                                     Social Determinants of Health (SDOH) Interventions SDOH Screenings   Food Insecurity: No Food Insecurity (10/26/2022)  Housing: Low Risk  (10/26/2022)  Transportation Needs: No Transportation Needs (10/26/2022)  Utilities: Not At Risk (10/26/2022)  Depression (PHQ2-9): Low Risk  (04/24/2019)  Tobacco Use: High Risk (10/26/2022)    Readmission Risk Interventions     No data to display

## 2022-10-30 NOTE — NC FL2 (Signed)
MEDICAID FL2 LEVEL OF CARE FORM     IDENTIFICATION  Patient Name: Philip Gordon Birthdate: 1950-08-09 Sex: male Admission Date (Current Location): 10/26/2022  [facility] and [place] Number:  Chiropodist and Address:  [facility], [address] [place], [place] [address]      Provider Number: [id number]  Attending Physician Name and Address:  Gillis Santa, MD  Relative Name and Phone Number:  Rayann Heman, [phone]    Current Level of Care: Hospital Recommended Level of Care: Skilled Nursing Facility Prior Approval Number:    Date Approved/Denied:   PASRR Number: [id number]  Discharge Plan: SNF    Current Diagnoses: Patient Active Problem List   Diagnosis Date Noted   Symptomatic anemia 10/27/2022   NSVT (nonsustained ventricular tachycardia) (HCC) 10/27/2022   PVC (premature ventricular contraction) 10/27/2022   Hypokalemia 10/26/2022   Hyperkalemia 10/25/2021   Sinus bradycardia 10/25/2021   Depression    Bigeminy    Chronic back pain 09/28/2020   Autoimmune hemolytic anemia (HCC) 04/18/2019   Fall 01/23/2019   Weakness 01/21/2019   AKI (acute kidney injury) (HCC) 01/21/2019   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HCC) 09/21/2017   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) 11/22/2016   Bilateral foot-drop 02/04/2015   Sinus infection 11/15/2014   Right mandibular swelling 11/15/2014   Brain lesion 01/17/2014   Obstruction of urinary tract 10/15/2012   Bladder outlet obstruction 10/15/2012   Disorder of bladder function 01/16/2012   Incomplete bladder emptying 01/16/2012   History of prostate cancer 01/16/2012   DS (disseminated sclerosis) (HCC) 01/16/2012   Hypertension 12/28/2011   Hypercholesteremia 12/28/2011   Multiple sclerosis (HCC) 12/28/2011   B12 deficiency 12/28/2011   Secondary progressive multiple sclerosis (HCC) 12/28/2011   Acquired ankle/foot deformity 12/07/2010    Orientation RESPIRATION  BLADDER Height & Weight     Situation, Time, Self  Normal External catheter Weight: 67.3 kg Height:  5\' 9"  (175.3 cm)  BEHAVIORAL SYMPTOMS/MOOD NEUROLOGICAL BOWEL NUTRITION STATUS  Other (Comment) (n/a)  (n/a) Continent Diet  AMBULATORY STATUS COMMUNICATION OF NEEDS Skin   Limited Assist Verbally Bruising (Erythema to buttocks)                       Personal Care Assistance Level of Assistance  Bathing, Feeding, Dressing Bathing Assistance: Limited assistance Feeding assistance: Limited assistance Dressing Assistance: Limited assistance     Functional Limitations Info             SPECIAL CARE FACTORS FREQUENCY  PT (By licensed PT), OT (By licensed OT)     PT Frequency: Min 2x weekly OT Frequency: Min 2x weekly            Contractures Contractures Info: Not present    Additional Factors Info  Code Status, Allergies Code Status Info: FULL Allergies Info: Gabapentin, Pregabalin           Current Medications (10/30/2022):  This is the current hospital active medication list Current Facility-Administered Medications  Medication Dose Route Frequency Provider Last Rate Last Admin   0.9 %  sodium chloride infusion  10 mL/hr Intravenous Once Gertha Calkin, MD   Held at 10/26/22 1928   acetaminophen (TYLENOL) tablet 650 mg  650 mg Oral Q6H PRN Gertha Calkin, MD       Or   acetaminophen (TYLENOL) suppository 650 mg  650 mg Rectal Q6H PRN Gertha Calkin, MD       Chlorhexidine Gluconate  Cloth 2 % PADS 6 each  6 each Topical Q0600 Gertha Calkin, MD   6 each at 10/26/22 2215   HYDROcodone-acetaminophen (NORCO/VICODIN) 5-325 MG per tablet 1 tablet  1 tablet Oral Q4H PRN Gertha Calkin, MD       melatonin tablet 5 mg  5 mg Oral QHS Lindajo Royal V, MD   5 mg at 10/29/22 2041   midodrine (PROAMATINE) tablet 2.5 mg  2.5 mg Oral TID WC Gillis Santa, MD   2.5 mg at 10/29/22 1719   mirtazapine (REMERON) tablet 15 mg  15 mg Oral QHS Andris Baumann, MD   15 mg at 10/29/22 2041    morphine (PF) 2 MG/ML injection 2 mg  2 mg Intravenous Q4H PRN Gertha Calkin, MD       predniSONE (DELTASONE) tablet 60 mg  60 mg Oral Q breakfast Irena Cords V, MD   60 mg at 10/29/22 1601   sodium chloride flush (NS) 0.9 % injection 3 mL  3 mL Intravenous Q12H Irena Cords V, MD   3 mL at 10/30/22 0921   tiZANidine (ZANAFLEX) tablet 4 mg  4 mg Oral QHS Andris Baumann, MD   4 mg at 10/29/22 2041     Discharge Medications: Please see discharge summary for a list of discharge medications.  Relevant Imaging Results:  Relevant Lab Results:   Additional Information SSN:[id number]  Truddie Hidden, RN

## 2022-10-30 NOTE — Discharge Summary (Addendum)
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 [facility]) Active Problems:   AKI (acute kidney injury) (HCC)   Hypertension   Hypokalemia   Bigeminy   Depression   Symptomatic anemia   NSVT (nonsustained ventricular tachycardia) (HCC)   PVC (premature ventricular contraction)   Admitted From: SNF Disposition:  SNF   Recommendations for Outpatient Follow-up:  F/u PCP in 1 wk, CBC in 1 wk F/u hematologist in 1 wk Follow-up with cardiologist in 1 week Follow up LABS/TEST: CBC in 1 week   Diet recommendation: Regular diet, low potassium  Activity: The patient is advised to gradually reintroduce usual activities, as tolerated  Discharge Condition: stable  Code Status: DNR   History of present illness: As per the H and P dictated on admission  Hospital Course:  Philip Gordon is a 72 y.o. male with medical history significant for multiple sclerosis hypertension, prostate cancer, coming for anemia.  Patient states that he has a blood disorder where the cold is a problem and interferes with blood production. Patient sees hematology.  Patient states he is feeling well otherwise and wants to go home today he receives his transfusion.  Review of systems is negative except for malaise fatigue a little bit of weakness.  Patient is pale appearing.  Patient reports falls but none recently. In the emergency room patient is alert awake oriented afebrile blood work shows hypokalemia of 2.6 AKI of 1.26 EGFR of 60 elevated total bili of 3.3 LDH of 222 reticulocyte count of 3.5, haptoglobin pending.  Anemia with a hemoglobin of 7.4 platelet count 436 normal white count. EKG shows sinus rhythm with PVCs, left anterior fascicular block PR 168 QTc of 425.   Assessment and Plan:   # Symptomatic  anemia most likely miotic anemia due to cold agglutinin antibodies S/p prednisone 60 mg p.o. daily x 2 days and 50 mg pod x 1 dose.  Continue to taper, prednisone 40 mg for 1 day, 30 mg for 1 day, 20 mg for 1 day and 10 mg for 1 day. Hb 7.4-7.1, transfuse 1 unit PRBC. LDH 222 elevated, reticulocyte count 3.5 elevated. Hematology consulted. On 8/4 Hb 7.4, transfuse 1 unit PRBC to keep Hb >8. On 8/5 Hb 9.9 and Hb 10.2 today, stable.  Patient was cleared by oncologist to discharge and follow-up in 1 week.  Repeat CBC after 1 week. # PVCs, nonsustained VT, atrial tachycardias and sinus pauses noticed on telemetry.  As per cardiology it is chronic and stable. recommended no plans for AAD or invasive therapies. S/p Toprol-XL 25 mg daily, which was discontinued due to 2.37 seconds pause.  Cardiology is aware.  Keep electrolytes k>4, mg>2. Last echo with normal EF and repeat on 8/5 TTE EF 55 to 60%, no WMA, no PFO.  Patient was cleared by cardiology to discharge and follow-up as an outpatient in 1 week.  Discontinued beta-blocker.  Started midodrine 5 mg p.o. 3 times daily as needed if BP remains low. # Hypertension, s/p metoprolol, which was discontinued due to bradycardia and pauses.  BP remained stable, continue to monitor.  If hypotensive then use midodrine as needed. # Depression, Continue sertraline and Remeron  #Hypokalemia, potassium repleted.  Resolved. # Hyperkalemia, potassium level is fluctuating.continue low potassium diet. On 8/5 K5.5, Lokelma 10  mg x 1 dose order placed. K 4.1 today within normal range.  Body mass index is 21.91 kg/m.  Nutrition Interventions:  - Patient was instructed, not to drive, operate heavy machinery, perform activities at heights, swimming or participation in water activities or provide baby sitting services while on Pain, Sleep and Anxiety Medications; until his outpatient Physician has advised to do so again.  - Also recommended to not to take more than prescribed Pain,  Sleep and Anxiety Medications.  Patient was seen by physical therapy, who recommended Therapy, SNF placement, which was arranged. On the day of the discharge the patient's vitals were stable, and no other acute medical condition were reported by patient. the patient was felt safe to be discharge at [facility].  Consultants: Oncologist and cardiologist Procedures: None  Discharge Exam: General: Appear in no distress, no Rash; Oral Mucosa Clear, moist. Cardiovascular: S1 and S2 Present, no Murmur, Respiratory: normal respiratory effort, Bilateral Air entry present and no Crackles, no wheezes Abdomen: Bowel Sound present, Soft and no tenderness, no hernia Extremities: no Pedal edema, no calf tenderness Neurology: alert and oriented to time, place, and person affect appropriate.  Filed Weights   10/27/22 0500 10/29/22 0434 10/30/22 0347  Weight: 67 kg 64 kg 67.3 kg   Vitals:   10/30/22 0719 10/30/22 1205  BP: 138/71 127/63  Pulse: (!) 56 82  Resp: 18   Temp: 98.2 F (36.8 C)   SpO2: 96%     DISCHARGE MEDICATION: Allergies as of 10/30/2022       Reactions   Gabapentin    Other reaction(s): Dizziness   Pregabalin    Other reaction(s): Dizziness        Medication List     STOP taking these medications    metoprolol succinate 25 MG 24 hr tablet Commonly known as: Toprol XL       TAKE these medications    acetaminophen 325 MG tablet Commonly known as: TYLENOL Take by mouth every 6 (six) hours as needed. As needed for pain   cetirizine 10 MG tablet Commonly known as: ZYRTEC Take 10 mg by mouth daily.   cyanocobalamin 1000 MCG tablet Commonly known as: VITAMIN B12 Take 1 tablet (1,000 mcg total) by mouth daily. What changed: how much to take   dextromethorphan 30 MG/5ML liquid Commonly known as: DELSYM Take 10 mLs by mouth every 12 (twelve) hours as needed (cough).   fenofibrate 54 MG tablet Take 54 mg by mouth daily.   finasteride 5 MG tablet Commonly known  as: PROSCAR Take 1 tablet (5 mg total) by mouth daily.   fluticasone 50 MCG/ACT nasal spray Commonly known as: FLONASE Place 2 sprays into both nostrils daily.   latanoprost 0.005 % ophthalmic solution Commonly known as: XALATAN Place 1 drop into both eyes at bedtime.   lidocaine 5 % Commonly known as: LIDODERM Place 1 patch onto the skin daily. Remove & Discard patch within 12 hours or as directed by MD   melatonin 3 MG Tabs tablet Take 3 mg by mouth at bedtime.   midodrine 5 MG tablet Commonly known as: PROAMATINE Take 1 tablet (5 mg total) by mouth 3 (three) times daily as needed (SBP <100).   mirtazapine 15 MG tablet Commonly known as: REMERON Take 15 mg by mouth at bedtime.   multivitamin capsule Take 1 capsule by mouth daily.   naloxone 4 MG/0.1ML Liqd nasal spray kit Commonly known as: NARCAN Place 1 spray into the nose 3 (three) times daily as  needed.   predniSONE 20 MG tablet Commonly known as: DELTASONE Take 2 tablets (40 mg total) by mouth daily with breakfast for 1 day, THEN 1.5 tablets (30 mg total) daily with breakfast for 1 day, THEN 1 tablet (20 mg total) daily with breakfast for 1 day, THEN 0.5 tablets (10 mg total) daily with breakfast for 1 day. Start taking on: October 31, 2022   sertraline 25 MG tablet Commonly known as: ZOLOFT Take 1 tablet (25 mg total) by mouth daily.   Systane 0.4-0.3 % Soln Generic drug: Polyethyl Glycol-Propyl Glycol Place 1 drop into both eyes 2 (two) times daily in the am and at bedtime.Marland Kitchen   tiZANidine 2 MG tablet Commonly known as: ZANAFLEX Take 2 mg by mouth at bedtime.               Discharge Care Instructions  (From admission, onward)           Start     Ordered   10/30/22 0000  Discharge wound care:       Comments: As per wound care RN   10/30/22 1059           Allergies  Allergen Reactions   Gabapentin     Other reaction(s): Dizziness   Pregabalin     Other reaction(s): Dizziness    Discharge Instructions     Call MD for:  difficulty breathing, headache or visual disturbances   Complete by: As directed    Call MD for:  extreme fatigue   Complete by: As directed    Call MD for:  persistant dizziness or light-headedness   Complete by: As directed    Call MD for:  persistant nausea and vomiting   Complete by: As directed    Call MD for:  severe uncontrolled pain   Complete by: As directed    Call MD for:  temperature >100.4   Complete by: As directed    Diet - low sodium heart healthy   Complete by: As directed    Discharge instructions   Complete by: As directed    F/u PCP in 1 wk, CBC in 1 wk F/u hematologist in 1 wk   Discharge wound care:   Complete by: As directed    As per wound care RN   Increase activity slowly   Complete by: As directed        The results of significant diagnostics from this hospitalization (including imaging, microbiology, ancillary and laboratory) are listed below for reference.    Significant Diagnostic Studies: ECHOCARDIOGRAM COMPLETE  Result Date: 10/29/2022    ECHOCARDIOGRAM REPORT   Patient Name:   REGIONAL HOVAN Date of Exam: 10/29/2022 Medical Rec #:  [id number]          Height:       69.0 in Accession #:    [id number]         Weight:       141.1 lb Date of Birth:  03/28/1950           BSA:          1.781 m Patient Age:    72 years           BP:           121/74 mmHg Patient Gender: M                  HR:           69 bpm. Exam Location:  [facility] Procedure:  2D Echo, Cardiac Doppler and Color Doppler Indications:     CHF-acute systolic I50.21  History:         Patient has prior history of Echocardiogram examinations, most                  recent 04/16/2021. Risk Factors:Hypertension.  Sonographer:     Cristela Blue Referring Phys:  Gillis Santa Diagnosing Phys: Yvonne Kendall MD IMPRESSIONS  1. Left ventricular ejection fraction, by estimation, is 55 to 60%. The left ventricle has normal function. The left ventricle has no regional  wall motion abnormalities. Left ventricular diastolic parameters were normal.  2. Right ventricular systolic function is normal. The right ventricular size is normal.  3. The mitral valve is normal in structure. Mild mitral valve regurgitation. No evidence of mitral stenosis.  4. The aortic valve is tricuspid. There is mild thickening of the aortic valve. Aortic valve regurgitation is trivial. Aortic valve sclerosis is present, with no evidence of aortic valve stenosis.  5. The inferior vena cava is normal in size with <50% respiratory variability, suggesting right atrial pressure of 8 mmHg. FINDINGS  Left Ventricle: Left ventricular ejection fraction, by estimation, is 55 to 60%. The left ventricle has normal function. The left ventricle has no regional wall motion abnormalities. The left ventricular internal cavity size was normal in size. There is  no left ventricular hypertrophy. Left ventricular diastolic parameters were normal. Right Ventricle: The right ventricular size is normal. No increase in right ventricular wall thickness. Right ventricular systolic function is normal. Left Atrium: Left atrial size was normal in size. Right Atrium: Right atrial size was normal in size. Pericardium: There is no evidence of pericardial effusion. Mitral Valve: The mitral valve is normal in structure. Mild mitral valve regurgitation. No evidence of mitral valve stenosis. Tricuspid Valve: The tricuspid valve is grossly normal. Tricuspid valve regurgitation is trivial. Aortic Valve: The aortic valve is tricuspid. There is mild thickening of the aortic valve. Aortic valve regurgitation is trivial. Aortic valve sclerosis is present, with no evidence of aortic valve stenosis. Pulmonic Valve: The pulmonic valve was normal in structure. Pulmonic valve regurgitation is not visualized. No evidence of pulmonic stenosis. Aorta: The aortic root is normal in size and structure. Pulmonary Artery: The pulmonary artery is of normal size.  Venous: The inferior vena cava is normal in size with less than 50% respiratory variability, suggesting right atrial pressure of 8 mmHg. IAS/Shunts: No atrial level shunt detected by color flow Doppler.  LEFT VENTRICLE PLAX 2D LVIDd:         4.80 cm LVIDs:         3.30 cm LV PW:         0.90 cm LV IVS:        0.90 cm LVOT diam:     2.00 cm LV SV:         63 LV SV Index:   35 LVOT Area:     3.14 cm  LEFT ATRIUM           Index        RIGHT ATRIUM LA diam:      1.60 cm 0.90 cm/m   RA Pressure: 25.00 mmHg LA Vol (A2C): 27.8 ml 15.61 ml/m LA Vol (A4C): 13.9 ml 7.80 ml/m  AORTIC VALVE LVOT Vmax:   77.00 cm/s LVOT VTI:    0.200 m  AORTA Ao Root diam: 2.79 cm TRICUSPID VALVE Estimated RAP:  25.00 mmHg  SHUNTS Systemic VTI:  0.20  m Systemic Diam: 2.00 cm Yvonne Kendall MD Electronically signed by Yvonne Kendall MD Signature Date/Time: 10/29/2022/12:22:45 PM    Final     Microbiology: Recent Results (from the past 240 hour(s))  MRSA Next Gen by PCR, Nasal     Status: None   Collection Time: 10/27/22 10:50 AM   Specimen: Nasal Mucosa; Nasal Swab  Result Value Ref Range Status   MRSA by PCR Next Gen NOT DETECTED NOT DETECTED Final    Comment: (NOTE) The GeneXpert MRSA Assay (FDA approved for NASAL specimens only), is one component of a comprehensive MRSA colonization surveillance program. It is not intended to diagnose MRSA infection nor to guide or monitor treatment for MRSA infections. Test performance is not FDA approved in patients less than 27 years old. Performed at [facility], [address] [place], [place] [address]      Labs: CBC: Recent Labs  Lab 10/26/22 1630 10/27/22 0334 10/27/22 1709 10/28/22 0541 10/28/22 1711 10/29/22 0453 10/30/22 0056  WBC 9.9 8.0  --  11.9*  --  12.2* 10.0  10.3  HGB 7.4* 7.1* 7.8* 7.4* 10.3* 9.9* 9.6*  10.2*  HCT 20.5* 20.0* 21.9* 21.3* 21.1* QUESTIONABLE RESULTS, RECOMMEND RECOLLECT TO VERIFY 26.0*  Unable to determine due to a cold  agglutinin  MCV 94.9 97.6  --  95.5  --  QUESTIONABLE RESULTS, RECOMMEND RECOLLECT TO VERIFY 95.2  Unable to determine due to a cold agglutinin  PLT 436* 394  --  376  --  401* 305  420*   Basic Metabolic Panel: Recent Labs  Lab 10/27/22 0334 10/28/22 0541 10/29/22 0709 10/29/22 1049 10/30/22 0056  NA 142 139 137 139 133*  K 5.0 3.5 5.9* 5.5* 4.1  CL 113* 110 110 110 105  CO2 21* 24 21* 20* 23  GLUCOSE 119* 92 87 128* 117*  BUN 17 18 21 20 22   CREATININE 1.09 1.15 1.13 1.22 1.18  CALCIUM 8.8* 8.1* 8.1* 8.3* 8.2*  MG 1.8 2.3 2.1  --  2.3  PHOS  --  3.5 3.4  --   --    Liver Function Tests: Recent Labs  Lab 10/26/22 1630 10/27/22 0334 10/28/22 0541 10/29/22 0709 10/30/22 0056  AST 34 29 20 19 16   ALT 32 25 21 17 17   ALKPHOS 87 78 61 62 74  BILITOT 3.3* 2.8* 2.3* 2.0* 1.4*  PROT 6.5 6.0* 5.1* 5.3* 5.4*  ALBUMIN 3.8 3.4* 2.9* 3.0* 3.2*   No results for input(s): "LIPASE", "AMYLASE" in the last 168 hours. No results for input(s): "AMMONIA" in the last 168 hours. Cardiac Enzymes: No results for input(s): "CKTOTAL", "CKMB", "CKMBINDEX", "TROPONINI" in the last 168 hours. BNP (last 3 results) No results for input(s): "BNP" in the last 8760 hours. CBG: Recent Labs  Lab 10/26/22 2214  GLUCAP 102*    Time spent: 35 minutes  Signed:  Gillis Santa  [facility]  10/30/2022 12:45 PM

## 2022-10-30 NOTE — Progress Notes (Signed)
[facility] Telemetry Evaluation  Date: 10/30/22  Time: 12:45 PM  I was asked to evaluate Philip Gordon's telemetry.  She has continued to have frequent PVC's.  Overnight, PVC's are less frequent with underlying sinus bradycardia.  Around midnight, she had a brief atrial run (likely atrial tachycardia) with a 2.37 second post-termination pause.  Patient was asymptomatic and has been off metoprolol.  I recommend continuing to hold metoprolol given baseline sinus bradycardia and outpatient follow-up with Dr. Azucena Cecil.  EP has previously recommended against antiarrhythmic therapy and other aggressive interventions.  Philip Kendall, MD [facility]

## 2022-10-30 NOTE — Consult Note (Signed)
WOC Nurse Consult Note: Reason for Consult:left hip with 1cm round, shallow ulcer (Stage 3) with undermining to 1.5cm circumferentially, Managed by the Provider at [facility] resources, according to the patient. Wound type:pressure Pressure Injury POA: Yes Measurement:AS described above Wound WUJ:WJXB pink, moist Drainage (amount, consistency, odor) small serous Periwound: intact Dressing procedure/placement/frequency:I have provided conservative guidance for the wound using a NS cleanse followed by placement of a silver hydrofiber in an attempt to reseal the undermining areas by absorbing the excess moisture. This is to be topped with a silicone foam to decrease shear forces during transferring. Patient is discharging back to [facility] today.  Dr. Dannial Monarch was present during my assessment and the Bedside RN was very helpful in POC development.  WOC nursing team will not follow, but will remain available to this patient, the nursing and medical teams.  Please re-consult if needed.  Thank you for inviting [place] to participate in this patient's Plan of Care.  Ladona Mow, MSN, RN, CNS, GNP, Leda Min, [employer], [employer] phone:  [phone]

## 2022-10-30 NOTE — Progress Notes (Signed)
Discharged. Report called to [facility]. AVS printed, reviewed with RN. All questions answered.

## 2022-10-31 ENCOUNTER — Other Ambulatory Visit: Payer: Self-pay | Admitting: *Deleted

## 2022-10-31 DIAGNOSIS — D591 Autoimmune hemolytic anemia, unspecified: Secondary | ICD-10-CM

## 2022-10-31 LAB — BPAM RBC
Blood Product Expiration Date: 202409022359
Blood Product Expiration Date: 202409062359
Blood Product Expiration Date: 202409062359
Blood Product Expiration Date: 202409062359
ISSUE DATE / TIME: 202408031243
ISSUE DATE / TIME: 202408041348
Unit Type and Rh: 5100
Unit Type and Rh: 5100
Unit Type and Rh: 5100
Unit Type and Rh: 5100

## 2022-11-07 ENCOUNTER — Inpatient Hospital Stay (HOSPITAL_BASED_OUTPATIENT_CLINIC_OR_DEPARTMENT_OTHER): Payer: Medicare Other | Admitting: Oncology

## 2022-11-07 ENCOUNTER — Encounter: Payer: Self-pay | Admitting: Oncology

## 2022-11-07 ENCOUNTER — Inpatient Hospital Stay: Payer: Medicare Other | Attending: Oncology

## 2022-11-07 ENCOUNTER — Other Ambulatory Visit: Payer: Self-pay | Admitting: *Deleted

## 2022-11-07 VITALS — BP 112/66 | HR 67 | Temp 95.2°F | Resp 18 | Ht 69.0 in | Wt 145.0 lb

## 2022-11-07 DIAGNOSIS — D5912 Cold autoimmune hemolytic anemia: Secondary | ICD-10-CM | POA: Diagnosis not present

## 2022-11-07 DIAGNOSIS — D591 Autoimmune hemolytic anemia, unspecified: Secondary | ICD-10-CM

## 2022-11-07 LAB — RETIC PANEL
Immature Retic Fract: 7.9 % (ref 2.3–15.9)
RBC.: 2.75 MIL/uL — ABNORMAL LOW (ref 4.22–5.81)
Retic Count, Absolute: 117.5 10*3/uL (ref 19.0–186.0)
Retic Ct Pct: 4.3 % — ABNORMAL HIGH (ref 0.4–3.1)
Reticulocyte Hemoglobin: 36 pg (ref >27.9)

## 2022-11-07 LAB — CBC WITH DIFFERENTIAL/PLATELET
Abs Immature Granulocytes: 0.15 10*3/uL — ABNORMAL HIGH (ref 0.00–0.07)
Basophils Absolute: 0.1 10*3/uL (ref 0.0–0.1)
Basophils Relative: 1 %
Eosinophils Absolute: 0.1 10*3/uL (ref 0.0–0.5)
Eosinophils Relative: 1 %
HCT: 25.4 % — ABNORMAL LOW (ref 39.0–52.0)
Hemoglobin: 9.5 g/dL — ABNORMAL LOW (ref 13.0–17.0)
Immature Granulocytes: 2 %
Lymphocytes Relative: 13 %
Lymphs Abs: 1.2 10*3/uL (ref 0.7–4.0)
MCH: 40.3 pg — ABNORMAL HIGH (ref 26.0–34.0)
MCHC: 37.4 g/dL — ABNORMAL HIGH (ref 30.0–36.0)
MCV: 107.6 fL — ABNORMAL HIGH (ref 80.0–100.0)
Monocytes Absolute: 1.1 10*3/uL — ABNORMAL HIGH (ref 0.1–1.0)
Monocytes Relative: 11 %
Neutro Abs: 6.9 10*3/uL (ref 1.7–7.7)
Neutrophils Relative %: 72 %
Platelets: 358 10*3/uL (ref 150–400)
RBC: 2.36 MIL/uL — ABNORMAL LOW (ref 4.22–5.81)
RDW: 20.4 % — ABNORMAL HIGH (ref 11.5–15.5)
WBC: 9.4 10*3/uL (ref 4.0–10.5)
nRBC: 0 % (ref 0.0–0.2)

## 2022-11-07 LAB — COMPREHENSIVE METABOLIC PANEL
ALT: 14 U/L (ref 0–44)
AST: 19 U/L (ref 15–41)
Albumin: 3.5 g/dL (ref 3.5–5.0)
Alkaline Phosphatase: 81 U/L (ref 38–126)
Anion gap: 6 (ref 5–15)
BUN: 17 mg/dL (ref 8–23)
CO2: 25 mmol/L (ref 22–32)
Calcium: 8.7 mg/dL — ABNORMAL LOW (ref 8.9–10.3)
Chloride: 105 mmol/L (ref 98–111)
Creatinine, Ser: 1.03 mg/dL (ref 0.61–1.24)
GFR, Estimated: >60 mL/min (ref >60)
Glucose, Bld: 87 mg/dL (ref 70–99)
Potassium: 4.5 mmol/L (ref 3.5–5.1)
Sodium: 136 mmol/L (ref 135–145)
Total Bilirubin: 3.4 mg/dL — ABNORMAL HIGH (ref 0.3–1.2)
Total Protein: 6 g/dL — ABNORMAL LOW (ref 6.5–8.1)

## 2022-11-07 NOTE — Progress Notes (Signed)
Hematology/Oncology Consult note [facility]  Telephone:([phone] Fax:([phone]  Patient Care Team: Rosetta Posner, MD as PCP - General (Geriatric Medicine) Debbe Odea, MD as PCP - Cardiology (Cardiology) Rosetta Posner, MD as Referring Physician (Geriatric Medicine) Reginold Agent, Rocky Crafts, NP as Nurse Practitioner (Nurse Practitioner) Creig Hines, MD as Consulting Physician (Oncology)   Name of the patient: Philip Gordon  [id number]  [birth year]   Date of visit: 11/07/22  Diagnosis-history of cold agglutinin hemolytic anemia  Chief complaint/ Reason for visit-routine follow-up of cold agglutinin hemolytic anemia  Heme/Onc history: Patient is a 72 year old male with history of cold agglutinin hemolytic anemia who was last seen by me as an outpatient in September 2019. His hemoglobin at that time was stable around 10 and he has never required any treatment for cold agglutinin disease. He has had a workup for cold agglutinin including negative ANA, normal C3-C4 as well as negative hepatitis B and C testing. Myeloma panel did not reveal any M protein. Rheumatoid factor testing at that time was negative.  CT chest abdomen pelvis done back in 2018 did not reveal any evidence of lymphoproliferative disorder.  Subsequently he was being followed by his primary care doctor and recently he was admitted to the hospital for low hemoglobin. His hemoglobin which was 9.1 a year ago had dropped down to 7.1 this year.  Patient was given blood transfusion and workup for cold agglutinin hemolytic anemia was positive.  Coombs test was positive for complement and negative for IgG.  Cold agglutinin titers wereHigh and positive at 1: 4096.  Iron studies were indicative of anemia of chronic disease B12 and folate levels were normal.   Interval history-[facility].  Overall he has a poor baseline functional status and does not ambulate much.  He has  chronic fatigue  ECOG PS- 3 Pain scale- 0   Review of systems- Review of Systems  Constitutional:  Positive for malaise/fatigue. Negative for chills, fever and weight loss.  HENT:  Negative for congestion, ear discharge and nosebleeds.   Eyes:  Negative for blurred vision.  Respiratory:  Negative for cough, hemoptysis, sputum production, shortness of breath and wheezing.   Cardiovascular:  Negative for chest pain, palpitations, orthopnea and claudication.  Gastrointestinal:  Negative for abdominal pain, blood in stool, constipation, diarrhea, heartburn, melena, nausea and vomiting.  Genitourinary:  Negative for dysuria, flank pain, frequency, hematuria and urgency.  Musculoskeletal:  Negative for back pain, joint pain and myalgias.  Skin:  Negative for rash.  Neurological:  Negative for dizziness, tingling, focal weakness, seizures, weakness and headaches.  Endo/Heme/Allergies:  Does not bruise/bleed easily.  Psychiatric/Behavioral:  Negative for depression and suicidal ideas. The patient does not have insomnia.       Allergies  Allergen Reactions   Gabapentin     Other reaction(s): Dizziness   Pregabalin     Other reaction(s): Dizziness     Past Medical History:  Diagnosis Date   Depression    Hypercholesterolemia    Hypertension    Multiple sclerosis (HCC)    Sees Dr [facility])   Prostate cancer [facility])    Followed by Dr Achilles Dunk and Dr Doylene Canning     Past Surgical History:  Procedure Laterality Date   KNEE SURGERY      Social History   Socioeconomic History   Marital status: Divorced    Spouse name: Not on file   Number of children: Not on file   Years  of education: Not on file   Highest education level: Not on file  Occupational History   Not on file  Tobacco Use   Smoking status: Never   Smokeless tobacco: Current    Types: Chew   Tobacco comments:    Is going to try and cut down on the amount.   Vaping Use   Vaping status: Never Used  Substance  and Sexual Activity   Alcohol use: No    Alcohol/week: 2.0 standard drinks of alcohol    Types: 2 Cans of beer per week   Drug use: No   Sexual activity: Not Currently  Other Topics Concern   Not on file  Social History Narrative   Not on file   Social Determinants of Health   Financial Resource Strain: Not on file  Food Insecurity: No Food Insecurity (10/26/2022)   Hunger Vital Sign    Worried About Running Out of Food in the Last Year: Never true    Ran Out of Food in the Last Year: Never true  Transportation Needs: No Transportation Needs (10/26/2022)   PRAPARE - Administrator, Civil Service (Medical): No    Lack of Transportation (Non-Medical): No  Physical Activity: Not on file  Stress: Not on file  Social Connections: Not on file  Intimate Partner Violence: Not At Risk (10/26/2022)   Humiliation, Afraid, Rape, and Kick questionnaire    Fear of Current or Ex-Partner: No    Emotionally Abused: No    Physically Abused: No    Sexually Abused: No    Family History  Problem Relation Age of Onset   Hypertension Mother    Stroke Father    Diabetes Maternal Grandmother    Diabetes Maternal Grandfather    Leukemia Sister        died - age 67     Current Outpatient Medications:    acetaminophen (TYLENOL) 325 MG tablet, Take by mouth every 6 (six) hours as needed. As needed for pain, Disp: , Rfl:    cetirizine (ZYRTEC) 10 MG tablet, Take 10 mg by mouth daily., Disp: , Rfl:    fenofibrate 54 MG tablet, Take 54 mg by mouth daily., Disp: , Rfl:    fluticasone (FLONASE) 50 MCG/ACT nasal spray, Place 2 sprays into both nostrils daily., Disp: 16 g, Rfl: 6   latanoprost (XALATAN) 0.005 % ophthalmic solution, Place 1 drop into both eyes at bedtime., Disp: , Rfl:    lidocaine (LIDODERM) 5 %, Place 1 patch onto the skin daily. Remove & Discard patch within 12 hours or as directed by MD, Disp: , Rfl:    melatonin 3 MG TABS tablet, Take 3 mg by mouth at bedtime., Disp: , Rfl:     midodrine (PROAMATINE) 5 MG tablet, Take 1 tablet (5 mg total) by mouth 3 (three) times daily as needed (SBP <100)., Disp: , Rfl:    mirtazapine (REMERON) 15 MG tablet, Take 15 mg by mouth at bedtime., Disp: , Rfl:    Multiple Vitamin (MULTIVITAMIN) capsule, Take 1 capsule by mouth daily., Disp: , Rfl:    sertraline (ZOLOFT) 25 MG tablet, Take 1 tablet (25 mg total) by mouth daily., Disp: 90 tablet, Rfl: 1   tiZANidine (ZANAFLEX) 2 MG tablet, Take 2 mg by mouth at bedtime., Disp: , Rfl:    vitamin B-12 (CYANOCOBALAMIN) 1000 MCG tablet, Take 1 tablet (1,000 mcg total) by mouth daily. (Patient taking differently: Take 500 mcg by mouth daily.), Disp: 90 tablet, Rfl: 3  dextromethorphan (DELSYM) 30 MG/5ML liquid, Take 10 mLs by mouth every 12 (twelve) hours as needed (cough). (Patient not taking: Reported on 11/07/2022), Disp: , Rfl:    finasteride (PROSCAR) 5 MG tablet, Take 1 tablet (5 mg total) by mouth daily. (Patient not taking: Reported on 11/07/2022), Disp: 90 tablet, Rfl: 3   naloxone (NARCAN) nasal spray 4 mg/0.1 mL, Place 1 spray into the nose 3 (three) times daily as needed. (Patient not taking: Reported on 11/07/2022), Disp: , Rfl:    Polyethyl Glycol-Propyl Glycol (SYSTANE) 0.4-0.3 % SOLN, Place 1 drop into both eyes 2 (two) times daily in the am and at bedtime.. (Patient not taking: Reported on 11/07/2022), Disp: , Rfl:   Physical exam:  Vitals:   11/07/22 1051  BP: 112/66  Pulse: 67  Resp: 18  Temp: (!) 95.2 F (35.1 C)  TempSrc: Tympanic  SpO2: 100%  Weight: 145 lb (65.8 kg)  Height: 5\' 9"  (1.753 m)   Physical Exam Constitutional:      Comments: Thin elderly frail patient sitting in a wheelchair.  Appears in no acute distress  Cardiovascular:     Rate and Rhythm: Normal rate and regular rhythm.     Heart sounds: Normal heart sounds.  Pulmonary:     Effort: Pulmonary effort is normal.     Breath sounds: Normal breath sounds.  Abdominal:     General: Bowel sounds are normal.      Palpations: Abdomen is soft.  Skin:    General: Skin is warm and dry.  Neurological:     Mental Status: He is alert and oriented to person, place, and time.         Latest Ref Rng & Units 11/07/2022   10:37 AM  CMP  Glucose 70 - 99 mg/dL 87   BUN 8 - 23 mg/dL 17   Creatinine 7.82 - 1.24 mg/dL 9.56   Sodium 213 - 086 mmol/L 136   Potassium 3.5 - 5.1 mmol/L 4.5   Chloride 98 - 111 mmol/L 105   CO2 22 - 32 mmol/L 25   Calcium 8.9 - 10.3 mg/dL 8.7   Total Protein 6.5 - 8.1 g/dL 6.0   Total Bilirubin 0.3 - 1.2 mg/dL 3.4   Alkaline Phos 38 - 126 U/L 81   AST 15 - 41 U/L 19   ALT 0 - 44 U/L 14       Latest Ref Rng & Units 11/07/2022   10:37 AM  CBC  WBC 4.0 - 10.5 K/uL 9.4   Hemoglobin 13.0 - 17.0 g/dL 9.5   Hematocrit 57.8 - 52.0 % 25.4   Platelets 150 - 400 K/uL 358     No images are attached to the encounter.  ECHOCARDIOGRAM COMPLETE  Result Date: 10/29/2022    ECHOCARDIOGRAM REPORT   Patient Name:   Philip Gordon Date of Exam: 10/29/2022 Medical Rec #:  [id number]          Height:       69.0 in Accession #:    [id number]         Weight:       141.1 lb Date of Birth:  07/23/1950           BSA:          1.781 m Patient Age:    72 years           BP:           121/74 mmHg Patient Gender: M  HR:           69 bpm. Exam Location:  [facility] Procedure: 2D Echo, Cardiac Doppler and Color Doppler Indications:     CHF-acute systolic I50.21  History:         Patient has prior history of Echocardiogram examinations, most                  recent 04/16/2021. Risk Factors:Hypertension.  Sonographer:     Cristela Blue Referring Phys:  Gillis Santa Diagnosing Phys: Yvonne Kendall MD IMPRESSIONS  1. Left ventricular ejection fraction, by estimation, is 55 to 60%. The left ventricle has normal function. The left ventricle has no regional wall motion abnormalities. Left ventricular diastolic parameters were normal.  2. Right ventricular systolic function is normal. The right ventricular  size is normal.  3. The mitral valve is normal in structure. Mild mitral valve regurgitation. No evidence of mitral stenosis.  4. The aortic valve is tricuspid. There is mild thickening of the aortic valve. Aortic valve regurgitation is trivial. Aortic valve sclerosis is present, with no evidence of aortic valve stenosis.  5. The inferior vena cava is normal in size with <50% respiratory variability, suggesting right atrial pressure of 8 mmHg. FINDINGS  Left Ventricle: Left ventricular ejection fraction, by estimation, is 55 to 60%. The left ventricle has normal function. The left ventricle has no regional wall motion abnormalities. The left ventricular internal cavity size was normal in size. There is  no left ventricular hypertrophy. Left ventricular diastolic parameters were normal. Right Ventricle: The right ventricular size is normal. No increase in right ventricular wall thickness. Right ventricular systolic function is normal. Left Atrium: Left atrial size was normal in size. Right Atrium: Right atrial size was normal in size. Pericardium: There is no evidence of pericardial effusion. Mitral Valve: The mitral valve is normal in structure. Mild mitral valve regurgitation. No evidence of mitral valve stenosis. Tricuspid Valve: The tricuspid valve is grossly normal. Tricuspid valve regurgitation is trivial. Aortic Valve: The aortic valve is tricuspid. There is mild thickening of the aortic valve. Aortic valve regurgitation is trivial. Aortic valve sclerosis is present, with no evidence of aortic valve stenosis. Pulmonic Valve: The pulmonic valve was normal in structure. Pulmonic valve regurgitation is not visualized. No evidence of pulmonic stenosis. Aorta: The aortic root is normal in size and structure. Pulmonary Artery: The pulmonary artery is of normal size. Venous: The inferior vena cava is normal in size with less than 50% respiratory variability, suggesting right atrial pressure of 8 mmHg. IAS/Shunts: No  atrial level shunt detected by color flow Doppler.  LEFT VENTRICLE PLAX 2D LVIDd:         4.80 cm LVIDs:         3.30 cm LV PW:         0.90 cm LV IVS:        0.90 cm LVOT diam:     2.00 cm LV SV:         63 LV SV Index:   35 LVOT Area:     3.14 cm  LEFT ATRIUM           Index        RIGHT ATRIUM LA diam:      1.60 cm 0.90 cm/m   RA Pressure: 25.00 mmHg LA Vol (A2C): 27.8 ml 15.61 ml/m LA Vol (A4C): 13.9 ml 7.80 ml/m  AORTIC VALVE LVOT Vmax:   77.00 cm/s LVOT VTI:    0.200 m  AORTA  Ao Root diam: 2.79 cm TRICUSPID VALVE Estimated RAP:  25.00 mmHg  SHUNTS Systemic VTI:  0.20 m Systemic Diam: 2.00 cm Yvonne Kendall MD Electronically signed by Yvonne Kendall MD Signature Date/Time: 10/29/2022/12:22:45 PM    Final      Assessment and plan- Patient is a 72 y.o. male here for routine follow-up of cold agglutinin hemolytic anemia  Labs including Coombs test and cold agglutinin titers consistent with cold agglutinin hemolytic anemia.Hemoglobin was 10.2 upon discharge from the hospital and is presently at 9.5.  He does not require any blood transfusion at this time.  We discussed natural history of cold agglutinin hemolytic anemia and possible triggers for flareup including infection, new lymphoproliferative disorder.  I will get a CT chest abdomen and pelvis with contrast at this time to rule this out although his CT scans from 2018 when he was initially diagnosed with cold agglutinin disease did not reveal any evidence of lymphoproliferative disorder.  He has baseline level of hemolysis that has been ongoing at a low level but his hemoglobin has been stable around 10.  I will get repeat hemolysis workup in 2 weeks in 4 weeks and I will see him back in 4 weeks with scans prior.  If there is no evidence of lymphoproliferative disorder I will hold off on getting a bone marrow biopsy given his overall poor performance status.  If hemoglobin remains less than 9 or anemia worsens I will give him a trial of Rituxan at  that time patient's brother and sister are with him today and they comprehend my plan well.   Visit Diagnosis 1. 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HCC)      Dr. Owens Shark, MD, MPH [facility] [phone] 11/07/2022 1:57 PM

## 2022-11-08 ENCOUNTER — Telehealth: Payer: Self-pay | Admitting: Cardiology

## 2022-11-08 NOTE — Telephone Encounter (Signed)
Attempted to return call but received message "Your call cannot be completed at this time. Please try again later"

## 2022-11-08 NOTE — Telephone Encounter (Signed)
[facility] are calling to get information needed for labs needed for CT and to also get information on the contrast. Please advise.

## 2022-11-09 LAB — HAPTOGLOBIN: Haptoglobin: <10 mg/dL — ABNORMAL LOW (ref 34–355)

## 2022-11-09 NOTE — Telephone Encounter (Signed)
Spoke to staff at [facility] and they needed his most recent labs faxed to them. Labs faxed to [phone]

## 2022-11-14 ENCOUNTER — Other Ambulatory Visit: Payer: Self-pay | Admitting: *Deleted

## 2022-11-14 ENCOUNTER — Ambulatory Visit
Admission: RE | Admit: 2022-11-14 | Discharge: 2022-11-14 | Disposition: A | Discharge status: Home / Self Care | Payer: Medicare Other | Source: Ambulatory Visit | Attending: Oncology | Admitting: Oncology

## 2022-11-14 ENCOUNTER — Telehealth: Payer: Self-pay | Admitting: *Deleted

## 2022-11-14 DIAGNOSIS — D5912 Cold autoimmune hemolytic anemia: Secondary | ICD-10-CM | POA: Insufficient documentation

## 2022-11-14 DIAGNOSIS — Z8546 Personal history of malignant neoplasm of prostate: Secondary | ICD-10-CM | POA: Insufficient documentation

## 2022-11-14 DIAGNOSIS — N200 Calculus of kidney: Secondary | ICD-10-CM | POA: Diagnosis not present

## 2022-11-14 DIAGNOSIS — I2510 Atherosclerotic heart disease of native coronary artery without angina pectoris: Secondary | ICD-10-CM | POA: Diagnosis not present

## 2022-11-14 DIAGNOSIS — M47814 Spondylosis without myelopathy or radiculopathy, thoracic region: Secondary | ICD-10-CM | POA: Insufficient documentation

## 2022-11-14 DIAGNOSIS — I700 Atherosclerosis of aorta: Secondary | ICD-10-CM | POA: Diagnosis not present

## 2022-11-14 MED ORDER — IOHEXOL 300 MG/ML  SOLN
100.0000 mL | Freq: Once | INTRAMUSCULAR | Status: AC | PRN
  Administered 2022-11-14: 100 mL via INTRAVENOUS

## 2022-11-14 MED ORDER — ELIQUIS DVT/PE STARTER PACK 5 MG PO TBPK: ORAL_TABLET | ORAL | 0 refills | Status: DC

## 2022-11-14 NOTE — Telephone Encounter (Signed)
Darl Pikes the cousin called back and she spoke to [facility] about the new clot. I called the nursing facility and spoke to a staff member and I told them about the  PE and that I can sent it to the pharmacy that [facility] which is [facility]. The rx was sent in and when I get back to work I can fax the paper to [facility] so it is on their  paper work that he can get the med there

## 2022-11-14 NOTE — Telephone Encounter (Signed)
Called Darl Pikes who is the cousin of pt. I told her that the ct scan did not show any cancer. It show a  small incidentally found blood clot in his right lobe of the lung. Dr. Smith Robert recommended  Eliquis 10 mg twice daily for 7 days followed by 5 mg twice daily for 3 months. She thought that he was in a facility and wanted to see if we can get that number to be able to order the eliquis. I left my cell phone for here to call me.

## 2022-11-15 ENCOUNTER — Telehealth: Payer: Self-pay | Admitting: *Deleted

## 2022-11-15 NOTE — Telephone Encounter (Signed)
I had spoke to staff at [facility] about the PE and the eliquis and they wanted to have a copy to put in the files for him. I sent the eliquis to the rx that [facility] and I faxed the [facility] the info. The fax went through.

## 2022-11-20 NOTE — Progress Notes (Signed)
Cardiology Office Note    Date:  11/23/2022   ID:  Philip Gordon, DOB 09-Jan-1951, MRN [id number]  PCP:  Rosetta Posner, MD  Cardiologist:  Debbe Odea, MD  Electrophysiologist:  None   Chief Complaint: Follow-up  History of Present Illness:   Philip Gordon is a 72 y.o. male with history of coronary artery calcification and aortic atherosclerosis noted on CT imaging, frequent PVCs, VT, multiple sclerosis, recently diagnosed PE, diastolic dysfunction, HTN, HLD, cold agglutinin hemolytic anemia, and prostate cancer who presents for follow-up of frequent PVCs.  Zio patch from 01/2021 showed a predominant rhythm of sinus with an average rate of 74 bpm, 293 episodes of VT occurred with the longest episode lasting 33.2 seconds, 4 episodes of SVT occurred with the longest episode lasting 10 beats, rare PACs, atrial couplets, atrial triplets, frequent PVCs representing a 45.6% burden, occasional ventricular couplets, and rare ventricular triplets.  Echo in 03/2021 showed an EF of 55 to 60%, no regional wall motion abnormalities, grade 2 diastolic dysfunction, normal RV systolic function and ventricular cavity size, mild mitral regurgitation, and aortic valve sclerosis without evidence of stenosis.  In this setting, he was evaluated by EP with recommendation against aggressive medical suppression given patient's comorbidities and in the setting of him being asymptomatic.  He was admitted to the hospital in 10/2022 with symptomatic anemia in the setting of cold agglutinin hemolytic anemia treated with steroids and PRBC.  Echo showed an EF of 55 to 60%, no regional wall motion abnormalities, normal LV diastolic function parameters, normal RV systolic function and ventricular cavity size, mild mitral regurgitation, and aortic valve sclerosis without evidence of stenosis.  During the admission, he continued to have frequent PVCs along with a brief atrial run felt to be atrial tachycardia with a  2.37 seconds posttermination pause.  He was asymptomatic been on metoprolol.  It was recommended the patient continue to hold metoprolol given baseline sinus bradycardia and follow-up with primary cardiologist as outpatient.  Following hospital discharge, he was evaluated by hematology/oncology with subsequent CT of the chest/abdomen/pelvis with contrast (to evaluate for metastatic disease) showed a small filling defect within the basilar posterior segmental branch pulmonary artery in the right lower lobe compatible with segmental PE.  In this setting, he was initiated on apixaban by hematology.  CT also showed coronary artery calcification, aortic atherosclerosis, small airway disease, nonobstructive bilateral renal stones, and no evidence of metastatic disease.  He comes in accompanied by family today who will include his healthcare POA.  Patient is without symptoms of angina or cardiac decompensation.  No palpitations, dizziness, presyncope, or syncope.  No lower extremity swelling or progressive orthopnea.  He denies any symptoms concerning for bleeding.  His weight is down 11 pounds today when compared to his last clinic visit in our office in 04/2021.  He remains off metoprolol.  Upon reviewing living facility paperwork, it appears he has not received any as needed midodrine.  He is scheduled for blood transfusion next week.   Labs independently reviewed: 10/2022 - Hgb 9.5, PLT 358, potassium 4.5, BUN 17, serum creatinine 1.03, albumin 3.5, AST/ALT normal, magnesium 2.3 10/2021 - TSH normal 03/2018 - TC 145, TG 130, HDL 44, LDL 74  Past Medical History:  Diagnosis Date   Depression    Hypercholesterolemia    Hypertension    Multiple sclerosis (HCC)    Sees Dr Har[facility])   Prostate cancer [facility])    Followed by Dr Achilles Dunk and Dr Doylene Canning  Past Surgical History:  Procedure Laterality Date   KNEE SURGERY      Current Medications: Current Meds  Medication Sig   acetaminophen  (TYLENOL) 325 MG tablet Take by mouth every 6 (six) hours as needed. As needed for pain   Apixaban Starter Pack, 10mg  and 5mg , (ELIQUIS DVT/PE STARTER PACK) Take as directed on package: start with two-5mg  tablets twice daily for 7 days. On day 8, switch to one-5mg  tablet twice daily.   cetirizine (ZYRTEC) 10 MG tablet Take 10 mg by mouth daily.   dextromethorphan (DELSYM) 30 MG/5ML liquid Take 10 mLs by mouth every 12 (twelve) hours as needed (cough).   fenofibrate 54 MG tablet Take 54 mg by mouth daily.   finasteride (PROSCAR) 5 MG tablet Take 1 tablet (5 mg total) by mouth daily.   fluticasone (FLONASE) 50 MCG/ACT nasal spray Place 2 sprays into both nostrils daily.   latanoprost (XALATAN) 0.005 % ophthalmic solution Place 1 drop into both eyes at bedtime.   lidocaine (LIDODERM) 5 % Place 1 patch onto the skin daily. Remove & Discard patch within 12 hours or as directed by MD   melatonin 3 MG TABS tablet Take 3 mg by mouth at bedtime.   midodrine (PROAMATINE) 5 MG tablet Take 1 tablet (5 mg total) by mouth 3 (three) times daily as needed (SBP <100).   mirtazapine (REMERON) 15 MG tablet Take 15 mg by mouth at bedtime.   Multiple Vitamin (MULTIVITAMIN) capsule Take 1 capsule by mouth daily.   Polyethyl Glycol-Propyl Glycol (SYSTANE) 0.4-0.3 % SOLN Place 1 drop into both eyes 2 (two) times daily in the am and at bedtime..   sertraline (ZOLOFT) 25 MG tablet Take 1 tablet (25 mg total) by mouth daily.   tiZANidine (ZANAFLEX) 2 MG tablet Take 2 mg by mouth at bedtime.   vitamin B-12 (CYANOCOBALAMIN) 1000 MCG tablet Take 1 tablet (1,000 mcg total) by mouth daily. (Patient taking differently: Take 500 mcg by mouth daily.)    Allergies:   Gabapentin and Pregabalin   Social History   Socioeconomic History   Marital status: Divorced    Spouse name: Not on file   Number of children: Not on file   Years of education: Not on file   Highest education level: Not on file  Occupational History   Not on  file  Tobacco Use   Smoking status: Never   Smokeless tobacco: Current    Types: Chew   Tobacco comments:    Is going to try and cut down on the amount.   Vaping Use   Vaping status: Never Used  Substance and Sexual Activity   Alcohol use: No    Alcohol/week: 2.0 standard drinks of alcohol    Types: 2 Cans of beer per week   Drug use: No   Sexual activity: Not Currently  Other Topics Concern   Not on file  Social History Narrative   Not on file   Social Determinants of Health   Financial Resource Strain: Not on file  Food Insecurity: No Food Insecurity (10/26/2022)   Hunger Vital Sign    Worried About Running Out of Food in the Last Year: Never true    Ran Out of Food in the Last Year: Never true  Transportation Needs: No Transportation Needs (10/26/2022)   PRAPARE - Administrator, Civil Service (Medical): No    Lack of Transportation (Non-Medical): No  Physical Activity: Not on file  Stress: Not on file  Social  Connections: Not on file     Family History:  The patient's family history includes Diabetes in his maternal grandfather and maternal grandmother; Hypertension in his mother; Leukemia in his sister; Stroke in his father.  ROS:   12-point review of systems is negative unless otherwise noted in the HPI.   EKGs/Labs/Other Studies Reviewed:    Studies reviewed were summarized above. The additional studies were reviewed today:  2D echo 10/29/2022: 1. Left ventricular ejection fraction, by estimation, is 55 to 60%. The  left ventricle has normal function. The left ventricle has no regional  wall motion abnormalities. Left ventricular diastolic parameters were  normal.   2. Right ventricular systolic function is normal. The right ventricular  size is normal.   3. The mitral valve is normal in structure. Mild mitral valve  regurgitation. No evidence of mitral stenosis.   4. The aortic valve is tricuspid. There is mild thickening of the aortic  valve.  Aortic valve regurgitation is trivial. Aortic valve sclerosis is  present, with no evidence of aortic valve stenosis.   5. The inferior vena cava is normal in size with <50% respiratory  variability, suggesting right atrial pressure of 8 mmHg.  __________  2D echo 04/14/2021: 1. Left ventricular ejection fraction, by estimation, is 55 to 60%. The  left ventricle has normal function. The left ventricle has no regional  wall motion abnormalities. Left ventricular diastolic parameters are  consistent with Grade II diastolic  dysfunction (pseudonormalization).   2. Right ventricular systolic function is normal. The right ventricular  size is normal.   3. The mitral valve is normal in structure. Mild mitral valve  regurgitation. No evidence of mitral stenosis.   4. The aortic valve was not well visualized. Aortic valve regurgitation  is not visualized. Aortic valve sclerosis is present, with no evidence of  aortic valve stenosis.   5. The inferior vena cava is normal in size with greater than 50%  respiratory variability, suggesting right atrial pressure of 3 mmHg.  __________  [facility] patch 02/10/2021: Patient had a min HR of 45 bpm, max HR of 184 bpm, and avg HR of 74 bpm. Predominant underlying rhythm was Sinus Rhythm. 293 Ventricular Tachycardia runs occurred, the run with the fastest interval lasting 4 beats with a max rate of 184 bpm, the longest lasting 33.2 secs with an avg rate of 117 bpm. 4 Supraventricular Tachycardia runs occurred, the run with the fastest interval lasting 8 beats with a max rate of 169 bpm, the longest lasting 10 beats with an avg rate of 118 bpm. Idioventricular Rhythm was present. Isolated SVEs were rare (<1.0%), SVE Couplets were rare (<1.0%), and SVE Triplets were rare (<1.0%). Isolated VEs were frequent (45.6%, [id number]), VE Couplets were occasional (2.1%, 14684), and VE Triplets were rare (<1.0%, 1564). Ventricular  Bigeminy and Trigeminy were present.    EKG:  EKG  is ordered today.  The EKG ordered today demonstrates NSR, 71 bpm, left axis deviation, incomplete RBBB, consistent with prior tracing  Recent Labs: 10/30/2022: Magnesium 2.3 11/21/2022: ALT 10; BUN 14; Creatinine, Ser 1.31; Hemoglobin 7.5; Platelets 478; Potassium 3.6; Sodium 136  Recent Lipid Panel    Component Value Date/Time   CHOL 145 03/31/2018 1429   TRIG 130.0 03/31/2018 1429   HDL 44.70 03/31/2018 1429   CHOLHDL 3 03/31/2018 1429   VLDL 26.0 03/31/2018 1429   LDLCALC 74 03/31/2018 1429   LDLDIRECT 129.6 01/30/2013 0946    PHYSICAL EXAM:    VS:  BP  126/74 (BP Location: Left Arm, Patient Position: Sitting, Cuff Size: Normal)   Pulse 71   Ht 5\' 9"  (1.753 m)   Wt 145 lb (65.8 kg)   SpO2 99%   BMI 21.41 kg/m   BMI: Body mass index is 21.41 kg/m.  Physical Exam Vitals reviewed.  Constitutional:      Appearance: He is well-developed.  HENT:     Head: Normocephalic and atraumatic.  Eyes:     General:        Right eye: No discharge.        Left eye: No discharge.  Neck:     Vascular: No JVD.  Cardiovascular:     Rate and Rhythm: Normal rate and regular rhythm.     Heart sounds: S1 normal and S2 normal. Heart sounds not distant. No midsystolic click and no opening snap. Murmur heard.     Systolic murmur is present with a grade of 1/6 at the upper right sternal border.     No friction rub.  Pulmonary:     Effort: Pulmonary effort is normal. No respiratory distress.     Breath sounds: Normal breath sounds. No decreased breath sounds, wheezing or rales.  Chest:     Chest wall: No tenderness.  Abdominal:     General: There is no distension.  Musculoskeletal:     Cervical back: Normal range of motion.     Comments: Braces noted along the lower extremities.  Skin:    General: Skin is warm and dry.     Nails: There is no clubbing.  Neurological:     Mental Status: He is alert and oriented to person, place, and time.  Psychiatric:        Speech: Speech normal.         Behavior: Behavior normal.        Thought Content: Thought content normal.        Judgment: Judgment normal.     Wt Readings from Last 3 Encounters:  11/23/22 145 lb (65.8 kg)  11/07/22 145 lb (65.8 kg)  10/30/22 148 lb 5.9 oz (67.3 kg)     ASSESSMENT & PLAN:   Frequent PVCs/VT/atrial tachycardia with posttermination pause: Asymptomatic with no significant ventricular ectopy noted on EKG in the office today.  Not currently on beta-blocker.  Potassium and magnesium at goal earlier this month.  Place 3-day Zio patch to quantify for continued ventricular ectopy burden, atrial arrhythmia, and post termination pauses.  If no significant pauses are noted and if his ventricular ectopy burden persists would benefit from resumption of metoprolol in an effort to suppress ventricular ectopy burden and minimize risk of cardiomyopathy development.  Previously evaluated by EP with no recommendation for antiarrhythmic therapy or further intervention.  HTN: Blood pressure is well controlled in the office today.   HLD: LDL 74 on last check.  Currently on fenofibrate.   Recently diagnosed PE/cold agglutinin hemolytic anemia: On apixaban and followed by hematology/oncology.      Disposition: F/u with Dr. Azucena Cecil in 2 to 3 months.   Medication Adjustments/Labs and Tests Ordered: Current medicines are reviewed at length with the patient today.  Concerns regarding medicines are outlined above. Medication changes, Labs and Tests ordered today are summarized above and listed in the Patient Instructions accessible in Encounters.   Signed, Eula Listen, PA-C 11/23/2022 4:29 PM     [facility] [place] [address] [place], [place] [address] [phone]

## 2022-11-21 ENCOUNTER — Inpatient Hospital Stay: Payer: Medicare Other

## 2022-11-21 DIAGNOSIS — D5912 Cold autoimmune hemolytic anemia: Secondary | ICD-10-CM | POA: Diagnosis not present

## 2022-11-21 DIAGNOSIS — D591 Autoimmune hemolytic anemia, unspecified: Secondary | ICD-10-CM

## 2022-11-21 LAB — CBC WITH DIFFERENTIAL/PLATELET
Abs Immature Granulocytes: 0.19 10*3/uL — ABNORMAL HIGH (ref 0.00–0.07)
Basophils Absolute: 0.1 10*3/uL (ref 0.0–0.1)
Basophils Relative: 1 %
Eosinophils Absolute: 0.1 10*3/uL (ref 0.0–0.5)
Eosinophils Relative: 2 %
HCT: 20.8 % — ABNORMAL LOW (ref 39.0–52.0)
Hemoglobin: 7.5 g/dL — ABNORMAL LOW (ref 13.0–17.0)
Immature Granulocytes: 3 %
Lymphocytes Relative: 18 %
Lymphs Abs: 1.2 10*3/uL (ref 0.7–4.0)
MCH: 38.5 pg — ABNORMAL HIGH (ref 26.0–34.0)
MCHC: 36.1 g/dL — ABNORMAL HIGH (ref 30.0–36.0)
MCV: 106.7 fL — ABNORMAL HIGH (ref 80.0–100.0)
Monocytes Absolute: 0.6 10*3/uL (ref 0.1–1.0)
Monocytes Relative: 9 %
Neutro Abs: 4.7 10*3/uL (ref 1.7–7.7)
Neutrophils Relative %: 67 %
Platelets: 478 10*3/uL — ABNORMAL HIGH (ref 150–400)
RBC: 1.95 MIL/uL — ABNORMAL LOW (ref 4.22–5.81)
RDW: 21.9 % — ABNORMAL HIGH (ref 11.5–15.5)
WBC: 7 10*3/uL (ref 4.0–10.5)
nRBC: 0 % (ref 0.0–0.2)

## 2022-11-21 LAB — COMPREHENSIVE METABOLIC PANEL
ALT: 10 U/L (ref 0–44)
AST: 21 U/L (ref 15–41)
Albumin: 3.7 g/dL (ref 3.5–5.0)
Alkaline Phosphatase: 92 U/L (ref 38–126)
Anion gap: 6 (ref 5–15)
BUN: 14 mg/dL (ref 8–23)
CO2: 23 mmol/L (ref 22–32)
Calcium: 8.6 mg/dL — ABNORMAL LOW (ref 8.9–10.3)
Chloride: 107 mmol/L (ref 98–111)
Creatinine, Ser: 1.31 mg/dL — ABNORMAL HIGH (ref 0.61–1.24)
GFR, Estimated: 57 mL/min — ABNORMAL LOW (ref >60)
Glucose, Bld: 87 mg/dL (ref 70–99)
Potassium: 3.6 mmol/L (ref 3.5–5.1)
Sodium: 136 mmol/L (ref 135–145)
Total Bilirubin: 2.8 mg/dL — ABNORMAL HIGH (ref 0.3–1.2)
Total Protein: 6.6 g/dL (ref 6.5–8.1)

## 2022-11-21 LAB — HEPATITIS B SURFACE ANTIGEN: Hepatitis B Surface Ag: NONREACTIVE

## 2022-11-21 LAB — HEPATITIS B CORE ANTIBODY, TOTAL: Hep B Core Total Ab: NONREACTIVE

## 2022-11-21 LAB — LACTATE DEHYDROGENASE: LDH: 171 U/L (ref 98–192)

## 2022-11-22 ENCOUNTER — Other Ambulatory Visit: Payer: Self-pay | Admitting: *Deleted

## 2022-11-22 ENCOUNTER — Encounter: Payer: Self-pay | Admitting: *Deleted

## 2022-11-22 ENCOUNTER — Telehealth: Payer: Self-pay | Admitting: *Deleted

## 2022-11-22 DIAGNOSIS — D591 Autoimmune hemolytic anemia, unspecified: Secondary | ICD-10-CM

## 2022-11-22 DIAGNOSIS — D5912 Cold autoimmune hemolytic anemia: Secondary | ICD-10-CM

## 2022-11-22 LAB — KAPPA/LAMBDA LIGHT CHAINS
Kappa free light chain: 36.4 mg/L — ABNORMAL HIGH (ref 3.3–19.4)
Kappa, lambda light chain ratio: 1.1 (ref 0.26–1.65)
Lambda free light chains: 33.1 mg/L — ABNORMAL HIGH (ref 5.7–26.3)

## 2022-11-22 LAB — COLD AGGLUTININ TITER: Cold Agglutinin Titer: 1:1024 {titer} — ABNORMAL HIGH

## 2022-11-22 LAB — HEPATITIS B SURFACE ANTIBODY, QUANTITATIVE: Hep B S AB Quant (Post): <3.5 m[IU]/mL — ABNORMAL LOW

## 2022-11-22 NOTE — Progress Notes (Signed)
Pt to get type and screen next week and on 9/3 at 10:45 AM, and Thursday  gets the transfusion and he needs to be here by 10 AM. .  Surely will let the people know at the nursing home and they will be transferring him from both appointments.

## 2022-11-22 NOTE — Telephone Encounter (Signed)
I called the cousin today.  I told her that we did not get a type and screen because his hemoglobin was good last time.  And then they his hemoglobin was 7.5.  Dr. Smith Robert has said that he needs a blood transfusion so we could set him up to come on 9 /3

## 2022-11-23 ENCOUNTER — Ambulatory Visit: Payer: Medicare Other

## 2022-11-23 ENCOUNTER — Telehealth: Payer: Self-pay | Admitting: *Deleted

## 2022-11-23 ENCOUNTER — Ambulatory Visit: Payer: Medicare Other | Attending: Physician Assistant | Admitting: Physician Assistant

## 2022-11-23 ENCOUNTER — Inpatient Hospital Stay: Payer: Medicare Other

## 2022-11-23 ENCOUNTER — Encounter: Payer: Self-pay | Admitting: Physician Assistant

## 2022-11-23 VITALS — BP 126/74 | HR 71 | Ht 69.0 in | Wt 145.0 lb

## 2022-11-23 DIAGNOSIS — I472 Ventricular tachycardia, unspecified: Secondary | ICD-10-CM | POA: Diagnosis not present

## 2022-11-23 DIAGNOSIS — I4719 Other supraventricular tachycardia: Secondary | ICD-10-CM | POA: Diagnosis not present

## 2022-11-23 DIAGNOSIS — D5912 Cold autoimmune hemolytic anemia: Secondary | ICD-10-CM

## 2022-11-23 DIAGNOSIS — I493 Ventricular premature depolarization: Secondary | ICD-10-CM | POA: Diagnosis not present

## 2022-11-23 DIAGNOSIS — I455 Other specified heart block: Secondary | ICD-10-CM

## 2022-11-23 DIAGNOSIS — I2693 Single subsegmental pulmonary embolism without acute cor pulmonale: Secondary | ICD-10-CM

## 2022-11-23 DIAGNOSIS — I10 Essential (primary) hypertension: Secondary | ICD-10-CM

## 2022-11-23 NOTE — Patient Instructions (Signed)
Medication Instructions:  No changes at this time.   *If you need a refill on your cardiac medications before your next appointment, please call your pharmacy*   Lab Work: None  If you have labs (blood work) drawn today and your tests are completely normal, you will receive your results only by: MyChart Message (if you have MyChart) OR A paper copy in the mail If you have any lab test that is abnormal or we need to change your treatment, we will call you to review the results.   Testing/Procedures: Your physician has recommended that you wear a Zio monitor for 3 days.   This monitor is a medical device that records the heart's electrical activity. Doctors most often use these monitors to diagnose arrhythmias. Arrhythmias are problems with the speed or rhythm of the heartbeat. The monitor is a small device applied to your chest. You can wear one while you do your normal daily activities. While wearing this monitor if you have any symptoms to push the button and record what you felt. Once you have worn this monitor for the period of time provider prescribed (Usually 14 days), you will return the monitor device in the postage paid box. Once it is returned they will download the data collected and provide [place] with a report which the provider will then review and we will call you with those results. Important tips:  Avoid showering during the first 24 hours of wearing the monitor. Avoid excessive sweating to help maximize wear time. Do not submerge the device, no hot tubs, and no swimming pools. Keep any lotions or oils away from the patch. After 24 hours you may shower with the patch on. Take brief showers with your back facing the shower head.  Do not remove patch once it has been placed because that will interrupt data and decrease adhesive wear time. Push the button when you have any symptoms and write down what you were feeling. Once you have completed wearing your monitor, remove and place  into box which has postage paid and place in your outgoing mailbox.  If for some reason you have misplaced your box then call our office and we can provide another box and/or mail it off for you.      Follow-Up: At [facility], you and your health needs are our priority.  As part of our continuing mission to provide you with exceptional heart care, we have created designated Provider Care Teams.  These Care Teams include your primary Cardiologist (physician) and Advanced Practice Providers (APPs -  Physician Assistants and Nurse Practitioners) who all work together to provide you with the care you need, when you need it.  We recommend signing up for the patient portal called "MyChart".  Sign up information is provided on this After Visit Summary.  MyChart is used to connect with patients for Virtual Visits (Telemedicine).  Patients are able to view lab/test results, encounter notes, upcoming appointments, etc.  Non-urgent messages can be sent to your provider as well.   To learn more about what you can do with MyChart, go to [email or web address].    Your next appointment:   Next available with Dr. Azucena Cecil ONLY

## 2022-11-23 NOTE — Telephone Encounter (Signed)
Hey Shelley-blood bank- this pt needs blood transfusion on Thursday next week for 1 unit of blood and he has cold agglutinin hemolytic anemia. I was told that pt needs blood warmer. we do not have one and I will need to see if OR would let us borrow. Kim , my boss says that I need to see if it is needed. let me know. He does stay heavy clothing and blankets.  She sent me this message: Hey there, thanks for asking. This patient does have Anti-I antibody which is a cold agglutinin. However, he does not require a blood warmer. His body was able to warm the transfused blood enough to keep it compatible. we transfused him two units the beginning of August and he tolerated them well without the warmer. So he shouldn't be a problem for [place].

## 2022-11-26 LAB — MULTIPLE MYELOMA PANEL, SERUM
Albumin SerPl Elph-Mcnc: 3.4 g/dL (ref 2.9–4.4)
Albumin/Glob SerPl: 1.5 (ref 0.7–1.7)
Alpha 1: 0.3 g/dL (ref 0.0–0.4)
Alpha2 Glob SerPl Elph-Mcnc: 0.5 g/dL (ref 0.4–1.0)
B-Globulin SerPl Elph-Mcnc: 0.8 g/dL (ref 0.7–1.3)
Gamma Glob SerPl Elph-Mcnc: 0.8 g/dL (ref 0.4–1.8)
Globulin, Total: 2.3 g/dL (ref 2.2–3.9)
IgA: 132 mg/dL (ref 61–437)
IgG (Immunoglobin G), Serum: 810 mg/dL (ref 603–1613)
IgM (Immunoglobulin M), Srm: 178 mg/dL — ABNORMAL HIGH (ref 15–143)
M Protein SerPl Elph-Mcnc: 0.1 g/dL — ABNORMAL HIGH
Total Protein ELP: 5.7 g/dL — ABNORMAL LOW (ref 6.0–8.5)

## 2022-11-27 ENCOUNTER — Other Ambulatory Visit: Payer: Self-pay | Admitting: *Deleted

## 2022-11-27 ENCOUNTER — Other Ambulatory Visit: Payer: Self-pay | Admitting: Oncology

## 2022-11-27 ENCOUNTER — Inpatient Hospital Stay: Payer: Medicare Other | Attending: Oncology

## 2022-11-27 DIAGNOSIS — D649 Anemia, unspecified: Secondary | ICD-10-CM

## 2022-11-27 DIAGNOSIS — Z7901 Long term (current) use of anticoagulants: Secondary | ICD-10-CM | POA: Insufficient documentation

## 2022-11-27 DIAGNOSIS — D591 Autoimmune hemolytic anemia, unspecified: Secondary | ICD-10-CM

## 2022-11-27 DIAGNOSIS — I2693 Single subsegmental pulmonary embolism without acute cor pulmonale: Secondary | ICD-10-CM | POA: Insufficient documentation

## 2022-11-27 DIAGNOSIS — D5912 Cold autoimmune hemolytic anemia: Secondary | ICD-10-CM | POA: Insufficient documentation

## 2022-11-28 LAB — PREPARE RBC (CROSSMATCH)

## 2022-11-29 ENCOUNTER — Ambulatory Visit: Payer: Medicare Other

## 2022-11-29 ENCOUNTER — Inpatient Hospital Stay: Payer: Medicare Other

## 2022-11-29 DIAGNOSIS — D5912 Cold autoimmune hemolytic anemia: Secondary | ICD-10-CM | POA: Diagnosis not present

## 2022-11-29 DIAGNOSIS — D649 Anemia, unspecified: Secondary | ICD-10-CM

## 2022-11-29 MED ORDER — SODIUM CHLORIDE 0.9% IV SOLUTION
250.0000 mL | Freq: Once | INTRAVENOUS | Status: AC
  Administered 2022-11-29: 250 mL via INTRAVENOUS
  Filled 2022-11-29: qty 250

## 2022-11-29 MED ORDER — ACETAMINOPHEN 325 MG PO TABS
650.0000 mg | ORAL_TABLET | Freq: Once | ORAL | Status: AC
  Administered 2022-11-29: 650 mg via ORAL
  Filled 2022-11-29: qty 2

## 2022-11-29 NOTE — Patient Instructions (Signed)

## 2022-11-30 LAB — TYPE AND SCREEN
ABO/RH(D): O POS
Antibody Screen: POSITIVE
DAT, IgG: POSITIVE
DAT, complement: POSITIVE
Unit division: 0

## 2022-11-30 LAB — BPAM RBC
Blood Product Expiration Date: 202410052359
ISSUE DATE / TIME: 202409051030
Unit Type and Rh: 5100

## 2022-12-05 ENCOUNTER — Other Ambulatory Visit: Payer: Self-pay | Admitting: *Deleted

## 2022-12-05 ENCOUNTER — Inpatient Hospital Stay: Payer: Medicare Other

## 2022-12-05 ENCOUNTER — Inpatient Hospital Stay (HOSPITAL_BASED_OUTPATIENT_CLINIC_OR_DEPARTMENT_OTHER): Payer: Medicare Other | Admitting: Oncology

## 2022-12-05 ENCOUNTER — Telehealth: Payer: Self-pay | Admitting: *Deleted

## 2022-12-05 ENCOUNTER — Encounter: Payer: Self-pay | Admitting: Oncology

## 2022-12-05 VITALS — BP 126/50 | HR 63 | Temp 95.4°F | Resp 18 | Wt 145.0 lb

## 2022-12-05 DIAGNOSIS — D591 Autoimmune hemolytic anemia, unspecified: Secondary | ICD-10-CM | POA: Diagnosis not present

## 2022-12-05 DIAGNOSIS — D5912 Cold autoimmune hemolytic anemia: Secondary | ICD-10-CM

## 2022-12-05 LAB — CBC
Hemoglobin: 10.9 g/dL — ABNORMAL LOW (ref 13.0–17.0)
Platelets: 357 10*3/uL (ref 150–400)
WBC: 7.4 10*3/uL (ref 4.0–10.5)
nRBC: 0 % (ref 0.0–0.2)

## 2022-12-05 LAB — COMPREHENSIVE METABOLIC PANEL
ALT: 11 U/L (ref 0–44)
AST: 16 U/L (ref 15–41)
Albumin: 4 g/dL (ref 3.5–5.0)
Alkaline Phosphatase: 88 U/L (ref 38–126)
Anion gap: 5 (ref 5–15)
BUN: 17 mg/dL (ref 8–23)
CO2: 25 mmol/L (ref 22–32)
Calcium: 8.7 mg/dL — ABNORMAL LOW (ref 8.9–10.3)
Chloride: 105 mmol/L (ref 98–111)
Creatinine, Ser: 1.19 mg/dL (ref 0.61–1.24)
GFR, Estimated: >60 mL/min (ref >60)
Glucose, Bld: 87 mg/dL (ref 70–99)
Potassium: 4 mmol/L (ref 3.5–5.1)
Sodium: 135 mmol/L (ref 135–145)
Total Bilirubin: 2.3 mg/dL — ABNORMAL HIGH (ref 0.3–1.2)
Total Protein: 7 g/dL (ref 6.5–8.1)

## 2022-12-05 NOTE — Telephone Encounter (Signed)
Pt has cold agglutinin hemolytic anemia, it takes a lot to work it out for lab today the hgb10.7 and 10.10.Chase says the hgb is not going to change from the above values

## 2022-12-05 NOTE — Progress Notes (Signed)
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 His hemoglobin at that time was stable around 10 and he has never required any treatment for cold agglutinin disease. He has had a workup for cold agglutinin including negative ANA, normal C3-C4 as well as negative hepatitis B and C testing. Myeloma panel did not reveal any M protein. Rheumatoid factor testing at that time was negative.  CT chest abdomen pelvis done back in 2018 did not reveal any evidence of lymphoproliferative disorder.  Subsequently he was being followed by his primary care doctor and recently he was admitted to the hospital for low hemoglobin. His hemoglobin which was 9.1 a year ago had dropped down to 7.1 this year.  Patient was given blood transfusion and workup for cold agglutinin hemolytic anemia was positive.  Coombs test was positive for complement and negative for IgG.  Cold agglutinin titers wereHigh and positive at 1: 4096.  Iron studies were indicative of anemia of chronic disease B12 and folate levels were normal.  CT chest abdomen pelvis with contrast in August 2024 did not reveal any evidence of malignancy.  Incidental subsegmental PE for which she is  on Eliquis    Interval history-patient is doing well at his facility.  He has chronic fatigue.  No new complaints at this time  ECOG PS- 3 Pain scale- 0   Review of systems- Review of Systems  Constitutional:  Negative for chills, fever, malaise/fatigue and weight loss.  HENT:  Negative for congestion, ear discharge and nosebleeds.   Eyes:  Negative for blurred vision.  Respiratory:  Negative for cough, hemoptysis, sputum production, shortness of breath and wheezing.   Cardiovascular:  Negative for chest pain, palpitations, orthopnea and claudication.  Gastrointestinal:  Negative for abdominal pain, blood in stool, constipation, diarrhea, heartburn, melena, nausea and vomiting.  Genitourinary:  Negative for dysuria, flank pain, frequency, hematuria and urgency.  Musculoskeletal:  Negative for back pain, joint pain and myalgias.  Skin:  Negative for rash.  Neurological:  Negative for dizziness, tingling, focal weakness, seizures, weakness and headaches.  Endo/Heme/Allergies:  Does not bruise/bleed easily.  Psychiatric/Behavioral:  Negative for depression and suicidal ideas. The patient does not have insomnia.       Allergies  Allergen Reactions   Gabapentin     Other reaction(s): Dizziness   Pregabalin     Other reaction(s): Dizziness     Past Medical History:  Diagnosis Date   Depression    Hypercholesterolemia    Hypertension    Multiple sclerosis (HCC)    Sees Dr [facility])   Prostate cancer [facility])    Followed by Dr Achilles Dunk and Dr Doylene Canning     Past Surgical History:  Procedure Laterality Date   KNEE SURGERY      Social History   Socioeconomic History  Marital status: Divorced    Spouse name: Not on file   Number of children: Not on file   Years of education: Not on file   Highest education level: Not on file  Occupational History   Not on file  Tobacco Use   Smoking status: Never   Smokeless tobacco: Current    Types: Chew   Tobacco comments:     Is going to try and cut down on the amount.   Vaping Use   Vaping status: Never Used  Substance and Sexual Activity   Alcohol use: No    Alcohol/week: 2.0 standard drinks of alcohol    Types: 2 Cans of beer per week   Drug use: No   Sexual activity: Not Currently  Other Topics Concern   Not on file  Social History Narrative   Not on file   Social Determinants of Health   Financial Resource Strain: Not on file  Food Insecurity: No Food Insecurity (10/26/2022)   Hunger Vital Sign    Worried About Running Out of Food in the Last Year: Never true    Ran Out of Food in the Last Year: Never true  Transportation Needs: No Transportation Needs (10/26/2022)   PRAPARE - Administrator, Civil Service (Medical): No    Lack of Transportation (Non-Medical): No  Physical Activity: Not on file  Stress: Not on file  Social Connections: Not on file  Intimate Partner Violence: Not At Risk (10/26/2022)   Humiliation, Afraid, Rape, and Kick questionnaire    Fear of Current or Ex-Partner: No    Emotionally Abused: No    Physically Abused: No    Sexually Abused: No    Family History  Problem Relation Age of Onset   Hypertension Mother    Stroke Father    Diabetes Maternal Grandmother    Diabetes Maternal Grandfather    Leukemia Sister        died - age 33     Current Outpatient Medications:    acetaminophen (TYLENOL) 325 MG tablet, Take by mouth every 6 (six) hours as needed. As needed for pain, Disp: , Rfl:    Apixaban Starter Pack, 10mg  and 5mg , (ELIQUIS DVT/PE STARTER PACK), Take as directed on package: start with two-5mg  tablets twice daily for 7 days. On day 8, switch to one-5mg  tablet twice daily., Disp: 74 each, Rfl: 0   cetirizine (ZYRTEC) 10 MG tablet, Take 10 mg by mouth daily., Disp: , Rfl:    dextromethorphan (DELSYM) 30 MG/5ML liquid, Take 10 mLs by mouth every 12 (twelve) hours as needed (cough)., Disp: , Rfl:    fenofibrate 54 MG tablet, Take 54 mg by mouth daily.,  Disp: , Rfl:    finasteride (PROSCAR) 5 MG tablet, Take 1 tablet (5 mg total) by mouth daily., Disp: 90 tablet, Rfl: 3   fluticasone (FLONASE) 50 MCG/ACT nasal spray, Place 2 sprays into both nostrils daily., Disp: 16 g, Rfl: 6   latanoprost (XALATAN) 0.005 % ophthalmic solution, Place 1 drop into both eyes at bedtime., Disp: , Rfl:    lidocaine (LIDODERM) 5 %, Place 1 patch onto the skin daily. Remove & Discard patch within 12 hours or as directed by MD, Disp: , Rfl:    melatonin 3 MG TABS tablet, Take 3 mg by mouth at bedtime., Disp: , Rfl:    midodrine (PROAMATINE) 5 MG tablet, Take 1 tablet (5 mg total) by mouth 3 (three) times daily as needed (SBP <100)., Disp: , Rfl:  mirtazapine (REMERON) 15 MG tablet, Take 15 mg by mouth at bedtime., Disp: , Rfl:    Multiple Vitamin (MULTIVITAMIN) capsule, Take 1 capsule by mouth daily., Disp: , Rfl:    naloxone (NARCAN) nasal spray 4 mg/0.1 mL, Place 1 spray into the nose 3 (three) times daily as needed., Disp: , Rfl:    Polyethyl Glycol-Propyl Glycol (SYSTANE) 0.4-0.3 % SOLN, Place 1 drop into both eyes 2 (two) times daily in the am and at bedtime.., Disp: , Rfl:    sertraline (ZOLOFT) 25 MG tablet, Take 1 tablet (25 mg total) by mouth daily., Disp: 90 tablet, Rfl: 1   tiZANidine (ZANAFLEX) 2 MG tablet, Take 2 mg by mouth at bedtime., Disp: , Rfl:    vitamin B-12 (CYANOCOBALAMIN) 1000 MCG tablet, Take 1 tablet (1,000 mcg total) by mouth daily. (Patient taking differently: Take 500 mcg by mouth daily.), Disp: 90 tablet, Rfl: 3  Physical exam:  Vitals:   12/05/22 1200  BP: (!) 126/50  Pulse: 63  Resp: 18  Temp: (!) 95.4 F (35.2 C)  TempSrc: Tympanic  SpO2: 100%  Weight: 145 lb (65.8 kg)   Physical Exam Constitutional:      Comments: Sitting in a wheelchair.  Appears in no acute distress.  Frail-appearing  Cardiovascular:     Rate and Rhythm: Normal rate and regular rhythm.     Heart sounds: Normal heart sounds.  Pulmonary:     Effort:  Pulmonary effort is normal.     Breath sounds: Normal breath sounds.  Abdominal:     General: Bowel sounds are normal.     Palpations: Abdomen is soft.  Skin:    General: Skin is warm and dry.  Neurological:     Mental Status: He is alert and oriented to person, place, and time.         Latest Ref Rng & Units 12/05/2022   11:28 AM  CMP  Glucose 70 - 99 mg/dL 87   BUN 8 - 23 mg/dL 17   Creatinine 9.62 - 1.24 mg/dL 9.52   Sodium 841 - 324 mmol/L 135   Potassium 3.5 - 5.1 mmol/L 4.0   Chloride 98 - 111 mmol/L 105   CO2 22 - 32 mmol/L 25   Calcium 8.9 - 10.3 mg/dL 8.7   Total Protein 6.5 - 8.1 g/dL 7.0   Total Bilirubin 0.3 - 1.2 mg/dL 2.3   Alkaline Phos 38 - 126 U/L 88   AST 15 - 41 U/L 16   ALT 0 - 44 U/L 11       Latest Ref Rng & Units 12/05/2022   12:56 PM  CBC  WBC 4.0 - 10.5 K/uL 7.4   Hemoglobin 13.0 - 17.0 g/dL 40.1   Hematocrit 02.7 - 52.0 % RESULTS UNAVAILABLE DUE TO INTERFERING SUBSTANCE   Platelets 150 - 400 K/uL 357     No images are attached to the encounter.  CT CHEST ABDOMEN PELVIS W CONTRAST  Result Date: 11/14/2022 CLINICAL DATA:  Autoimmune hemolytic anemia. History of prostate cancer. * Tracking Code: BO * EXAM: CT CHEST, ABDOMEN, AND PELVIS WITH CONTRAST TECHNIQUE: Multidetector CT imaging of the chest, abdomen and pelvis was performed following the standard protocol during bolus administration of intravenous contrast. RADIATION DOSE REDUCTION: This exam was performed according to the departmental dose-optimization program which includes automated exposure control, adjustment of the mA and/or kV according to patient size and/or use of iterative reconstruction technique. CONTRAST:  OMNIPAQUE IOHEXOL 300 MG/ML  SOLN COMPARISON:  Multiple priors including CT April 10 2016. FINDINGS: CT CHEST FINDINGS Cardiovascular: Aortic atherosclerosis. Small filling defect within a posterior basilar segmental branch pulmonary artery in the right lower lobe. Normal  size heart. Coronary artery calcifications. No significant pericardial effusion/thickening Mediastinum/Nodes: No suspicious thyroid nodule. No pathologically enlarged mediastinal, hilar or axillary lymph nodes. The esophagus is grossly unremarkable. Lungs/Pleura: No suspicious pulmonary nodules or masses. Hypoventilatory change in the dependent lungs. Mosaic attenuation of the lungs with mild diffuse bronchial wall thickening. Musculoskeletal: Similar appearance of the scattered subcutaneous/sebaceous cysts no aggressive lytic or blastic lesion of bone. Remote bilateral rib fractures. Thoracic spondylosis CT ABDOMEN PELVIS FINDINGS Hepatobiliary: No suspicious hepatic lesion. Scattered tiny hepatic hypodensities are stable from prior. Gallbladder is unremarkable. No biliary ductal dilation. Pancreas: No pancreatic ductal dilation or evidence of acute inflammation Spleen: No splenomegaly. Adrenals/Urinary Tract: Bilateral adrenal glands appear normal. No hydronephrosis. Kidneys demonstrate symmetric enhancement. Bilateral hypodense renal lesions are technically too small to accurately characterize but statistically likely to reflect cysts. Nonobstructive 6 mm left interpolar renal stone. Nonobstructive 2 mm right renal stone. Urinary bladder is unremarkable for degree of distension Stomach/Bowel: No radiopaque enteric contrast material was administered. Stomach is unremarkable for degree of distension. No pathologic dilation of small or large bowel. No evidence of acute bowel inflammation Vascular/Lymphatic: Aortic atherosclerosis. Smooth IVC contours. The portal, splenic and superior mesenteric veins are patent. No pathologically enlarged abdominal or pelvic lymph nodes. Reproductive: Brachytherapy seeds in the prostate gland. Other: No significant abdominopelvic free fluid. Calcifications in the posterior gluteal subcutaneous soft tissues. Musculoskeletal: No aggressive lytic or blastic lesion of bone. Heterotopic  ossification about the left femoral neck and greater trochanter. Similar appearance of the multifocal sclerotic osseous lesions likely reflecting bone islands. IMPRESSION: 1. Small filling defect within a basilar posterior segmental branch pulmonary artery in the right lower lobe compatible with segmental pulmonary embolus. 2. No evidence of metastatic disease in the chest, abdomen or pelvis. 3. Mosaic attenuation of the lungs with mild diffuse bronchial wall thickening, nonspecific but can be seen in the setting of small airways disease. 4. Nonobstructive bilateral renal stones. 5.  Aortic Atherosclerosis (ICD10-I70.0). These results will be called to the ordering clinician or representative by the Radiologist Assistant, and communication documented in the PACS or [employer]. Electronically Signed   By: Maudry Mayhew M.D.   On: 11/14/2022 14:41     Assessment and plan- Patient is a 72 y.o. male with cold agglutinin hemolytic anemia here for routine follow-up   Patient's hemoglobin was down to 7.5 2 weeks ago when he received a unit of blood transfusion.  Today his hemoglobin is 10.9.  Overall cold agglutinin titers are coming down.  CT scans did not reveal any evidence of malignancy.  My plan is to continue monitoring his CBC every 2 weeks and I will see him back in 6 weeks.  I would like to hold off on offering Rituxan at this time especially since his titers are coming down and if his hemoglobin remained stable around 10.  I will repeat his cold agglutinin titers again in 4 weeks time  Incidental subsegmental PE noted on CT scan he will continue Eliquis until November 2024   Visit Diagnosis 1. Autoimmune hemolytic anemia (HCC)   2. 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HCC)      Dr. Owens Shark, MD, MPH [facility] [phone] 12/05/2022 4:24 PM

## 2022-12-06 LAB — HAPTOGLOBIN: Haptoglobin: <10 mg/dL — ABNORMAL LOW (ref 34–355)

## 2022-12-19 ENCOUNTER — Inpatient Hospital Stay: Payer: Medicare Other

## 2022-12-19 DIAGNOSIS — D5912 Cold autoimmune hemolytic anemia: Secondary | ICD-10-CM | POA: Diagnosis not present

## 2022-12-19 DIAGNOSIS — D591 Autoimmune hemolytic anemia, unspecified: Secondary | ICD-10-CM

## 2022-12-19 LAB — CBC WITH DIFFERENTIAL/PLATELET
Abs Immature Granulocytes: 0.03 10*3/uL (ref 0.00–0.07)
Basophils Absolute: 0.1 10*3/uL (ref 0.0–0.1)
Basophils Relative: 1 %
Eosinophils Absolute: 0.1 10*3/uL (ref 0.0–0.5)
Eosinophils Relative: 1 %
HCT: 27.7 % — ABNORMAL LOW (ref 39.0–52.0)
Hemoglobin: 10.3 g/dL — ABNORMAL LOW (ref 13.0–17.0)
Immature Granulocytes: 1 %
Lymphocytes Relative: 22 %
Lymphs Abs: 1.3 10*3/uL (ref 0.7–4.0)
MCH: 40.4 pg — ABNORMAL HIGH (ref 26.0–34.0)
MCHC: 37.2 g/dL — ABNORMAL HIGH (ref 30.0–36.0)
MCV: 108.6 fL — ABNORMAL HIGH (ref 80.0–100.0)
Monocytes Absolute: 0.7 10*3/uL (ref 0.1–1.0)
Monocytes Relative: 12 %
Neutro Abs: 3.8 10*3/uL (ref 1.7–7.7)
Neutrophils Relative %: 63 %
Platelets: 354 10*3/uL (ref 150–400)
RBC: 2.55 MIL/uL — ABNORMAL LOW (ref 4.22–5.81)
RDW: 20.5 % — ABNORMAL HIGH (ref 11.5–15.5)
WBC: 5.9 10*3/uL (ref 4.0–10.5)
nRBC: 0 % (ref 0.0–0.2)

## 2023-01-02 ENCOUNTER — Inpatient Hospital Stay: Payer: Medicare Other | Attending: Oncology

## 2023-01-02 ENCOUNTER — Telehealth: Payer: Self-pay | Admitting: *Deleted

## 2023-01-02 ENCOUNTER — Other Ambulatory Visit: Payer: Self-pay | Admitting: *Deleted

## 2023-01-02 DIAGNOSIS — D5912 Cold autoimmune hemolytic anemia: Secondary | ICD-10-CM | POA: Diagnosis present

## 2023-01-02 DIAGNOSIS — D591 Autoimmune hemolytic anemia, unspecified: Secondary | ICD-10-CM

## 2023-01-02 DIAGNOSIS — I2693 Single subsegmental pulmonary embolism without acute cor pulmonale: Secondary | ICD-10-CM | POA: Diagnosis not present

## 2023-01-02 DIAGNOSIS — Z7901 Long term (current) use of anticoagulants: Secondary | ICD-10-CM | POA: Insufficient documentation

## 2023-01-02 LAB — COMPREHENSIVE METABOLIC PANEL
ALT: 10 U/L (ref 0–44)
AST: 18 U/L (ref 15–41)
Albumin: 4 g/dL (ref 3.5–5.0)
Alkaline Phosphatase: 79 U/L (ref 38–126)
Anion gap: 4 — ABNORMAL LOW (ref 5–15)
BUN: 17 mg/dL (ref 8–23)
CO2: 25 mmol/L (ref 22–32)
Calcium: 8.9 mg/dL (ref 8.9–10.3)
Chloride: 107 mmol/L (ref 98–111)
Creatinine, Ser: 1.38 mg/dL — ABNORMAL HIGH (ref 0.61–1.24)
GFR, Estimated: 54 mL/min — ABNORMAL LOW (ref >60)
Glucose, Bld: 82 mg/dL (ref 70–99)
Potassium: 3.9 mmol/L (ref 3.5–5.1)
Sodium: 136 mmol/L (ref 135–145)
Total Bilirubin: 2.1 mg/dL — ABNORMAL HIGH (ref 0.3–1.2)
Total Protein: 6.6 g/dL (ref 6.5–8.1)

## 2023-01-02 NOTE — Telephone Encounter (Signed)
I called Talbert Forest to tell her and she said to call [facility] and see if they can bring him over again due to the clotting. The staff said they will bring him at 9 am. I then called the phlebotomy and spoke to [facility] and told her they will bring then 10/10

## 2023-01-03 ENCOUNTER — Inpatient Hospital Stay: Payer: Medicare Other

## 2023-01-03 DIAGNOSIS — D5912 Cold autoimmune hemolytic anemia: Secondary | ICD-10-CM

## 2023-01-03 DIAGNOSIS — D591 Autoimmune hemolytic anemia, unspecified: Secondary | ICD-10-CM

## 2023-01-03 LAB — CBC WITH DIFFERENTIAL/PLATELET
Abs Immature Granulocytes: 0.04 10*3/uL (ref 0.00–0.07)
Basophils Absolute: 0.1 10*3/uL (ref 0.0–0.1)
Basophils Relative: 1 %
Eosinophils Absolute: 0.1 10*3/uL (ref 0.0–0.5)
Eosinophils Relative: 1 %
HCT: 31.7 % — ABNORMAL LOW (ref 39.0–52.0)
Hemoglobin: 10.7 g/dL — ABNORMAL LOW (ref 13.0–17.0)
Immature Granulocytes: 1 %
Lymphocytes Relative: 22 %
Lymphs Abs: 1.3 10*3/uL (ref 0.7–4.0)
MCH: 32.5 pg (ref 26.0–34.0)
MCHC: 33.8 g/dL (ref 30.0–36.0)
MCV: 96.4 fL (ref 80.0–100.0)
Monocytes Absolute: 0.7 10*3/uL (ref 0.1–1.0)
Monocytes Relative: 12 %
Neutro Abs: 3.7 10*3/uL (ref 1.7–7.7)
Neutrophils Relative %: 63 %
Platelets: 312 10*3/uL (ref 150–400)
RBC: 3.29 MIL/uL — ABNORMAL LOW (ref 4.22–5.81)
RDW: 13.3 % (ref 11.5–15.5)
WBC: 5.8 10*3/uL (ref 4.0–10.5)
nRBC: 0 % (ref 0.0–0.2)

## 2023-01-04 LAB — COLD AGGLUTININ TITER: Cold Agglutinin Titer: 1:256 {titer} — ABNORMAL HIGH

## 2023-01-04 LAB — HAPTOGLOBIN: Haptoglobin: <10 mg/dL — ABNORMAL LOW (ref 34–355)

## 2023-01-10 ENCOUNTER — Telehealth: Payer: Self-pay | Admitting: Cardiology

## 2023-01-10 ENCOUNTER — Encounter: Payer: Self-pay | Admitting: Cardiology

## 2023-01-10 ENCOUNTER — Ambulatory Visit: Payer: Medicare Other | Attending: Cardiology | Admitting: Cardiology

## 2023-01-10 VITALS — BP 148/62 | HR 79 | Ht 69.0 in | Wt 143.0 lb

## 2023-01-10 DIAGNOSIS — I493 Ventricular premature depolarization: Secondary | ICD-10-CM

## 2023-01-10 DIAGNOSIS — I471 Supraventricular tachycardia, unspecified: Secondary | ICD-10-CM | POA: Diagnosis not present

## 2023-01-10 NOTE — Telephone Encounter (Signed)
Pt c/o medication issue:  1. Name of Medication: Unsure  2. How are you currently taking this medication (dosage and times per day)?   3. Are you having a reaction (difficulty breathing--STAT)?   4. What is your medication issue?  Crystal at [employer] [employer] is calling to get clarification on medication Dr. Azucena Cecil is requesting the patient stop taking. She states she is unable to read the instructions. Please advise.

## 2023-01-10 NOTE — Telephone Encounter (Signed)
Called patient after receiving fax, advised it was to stop Metoprolol (no longer on medication list) he just wanted to clarify to the facility of this as well.   Facility verbalized understanding, thankful for call back

## 2023-01-10 NOTE — Patient Instructions (Signed)
Medication Instructions:  Your Physician recommend you continue on your current medication as directed.    *If you need a refill on your cardiac medications before your next appointment, please call your pharmacy*   Lab Work: None ordered If you have labs (blood work) drawn today and your tests are completely normal, you will receive your results only by: MyChart Message (if you have MyChart) OR A paper copy in the mail If you have any lab test that is abnormal or we need to change your treatment, we will call you to review the results.   Testing/Procedures: None ordered   Follow-Up: At [facility], you and your health needs are our priority.  As part of our continuing mission to provide you with exceptional heart care, we have created designated Provider Care Teams.  These Care Teams include your primary Cardiologist (physician) and Advanced Practice Providers (APPs -  Physician Assistants and Nurse Practitioners) who all work together to provide you with the care you need, when you need it.  We recommend signing up for the patient portal called "MyChart".  Sign up information is provided on this After Visit Summary.  MyChart is used to connect with patients for Virtual Visits (Telemedicine).  Patients are able to view lab/test results, encounter notes, upcoming appointments, etc.  Non-urgent messages can be sent to your provider as well.   To learn more about what you can do with MyChart, go to [email or web address].    Your next appointment:   1 year(s)  Provider:   You may see Debbe Odea, MD or one of the following Advanced Practice Providers on your designated Care Team:   Nicolasa Ducking, NP Eula Listen, PA-C Cadence Fransico Michael, PA-C Charlsie Quest, NP

## 2023-01-10 NOTE — Progress Notes (Signed)
Cardiology Office Note:    Date:  01/10/2023   ID:  Philip Gordon, DOB 1950-11-09, MRN [id number]  PCP:  Rosetta Posner, MD   [facility] Providers Cardiologist:  Debbe Odea, MD     Referring MD: Rosetta Posner, MD   Chief Complaint  Patient presents with   Follow-up    Zio monitor was placed, work, and returned at [facility] but was not activated prior to placement.      History of Present Illness:    Philip Gordon is a 72 y.o. male with a hx of hypertension, NSVT, paroxysmal SVT, frequent PVCs MS, PE on Eliquis who presents for follow-up.    History of paroxysmal SVT, nonsustained VT. recently admitted to the hospital 8/24 with symptoms of anemia, noted to have frequent PVCs.  Was on metoprolol at the time.  Noted to have occasional pauses on telemetry monitor.  Metoprolol was stopped.  Cardiac monitor was placed after last visit, this was not activated.  No new concerns at this time.  Presents today with POA.   Prior notes/testing Echo 8/24 EF 55 to 60% Cardiac monitor 02/2021 showed frequent PVCs, VT lasting 33 seconds, paroxysmal SVTs. Echocardiogram obtained 03/2021 showed EF 55 to 60%, mild MR.  Previous EKG in the office showed PVCs.   Past Medical History:  Diagnosis Date   Depression    Hypercholesterolemia    Hypertension    Multiple sclerosis (HCC)    Sees Dr [facility]len [facility])   Prostate cancer [facility])    Followed by Dr Achilles Dunk and Dr Doylene Canning    Past Surgical History:  Procedure Laterality Date   KNEE SURGERY      Current Medications: Current Meds  Medication Sig   acetaminophen (TYLENOL) 325 MG tablet Take by mouth every 6 (six) hours as needed. As needed for pain   apixaban (ELIQUIS) 5 MG TABS tablet Take 5 mg by mouth 2 (two) times daily.   cetirizine (ZYRTEC) 10 MG tablet Take 10 mg by mouth daily.   dextromethorphan (DELSYM) 30 MG/5ML liquid Take 10 mLs by mouth every 12 (twelve) hours as needed (cough).   fenofibrate 54 MG  tablet Take 54 mg by mouth daily.   finasteride (PROSCAR) 5 MG tablet Take 1 tablet (5 mg total) by mouth daily.   fluticasone (FLONASE) 50 MCG/ACT nasal spray Place 2 sprays into both nostrils daily.   latanoprost (XALATAN) 0.005 % ophthalmic solution Place 1 drop into both eyes at bedtime.   lidocaine (LIDODERM) 5 % Place 1 patch onto the skin daily. Remove & Discard patch within 12 hours or as directed by MD   melatonin 3 MG TABS tablet Take 3 mg by mouth at bedtime.   midodrine (PROAMATINE) 5 MG tablet Take 1 tablet (5 mg total) by mouth 3 (three) times daily as needed (SBP <100).   mirtazapine (REMERON) 15 MG tablet Take 15 mg by mouth at bedtime.   Multiple Vitamin (MULTIVITAMIN) capsule Take 1 capsule by mouth daily.   naloxone (NARCAN) nasal spray 4 mg/0.1 mL Place 1 spray into the nose 3 (three) times daily as needed.   Polyethyl Glycol-Propyl Glycol (SYSTANE) 0.4-0.3 % SOLN Place 1 drop into both eyes 2 (two) times daily in the am and at bedtime..   sertraline (ZOLOFT) 25 MG tablet Take 1 tablet (25 mg total) by mouth daily.   tiZANidine (ZANAFLEX) 2 MG tablet Take 2 mg by mouth at bedtime.     Allergies:   Gabapentin and Pregabalin  Social History   Socioeconomic History   Marital status: Divorced    Spouse name: Not on file   Number of children: Not on file   Years of education: Not on file   Highest education level: Not on file  Occupational History   Not on file  Tobacco Use   Smoking status: Never   Smokeless tobacco: Current    Types: Chew   Tobacco comments:    Is going to try and cut down on the amount.   Vaping Use   Vaping status: Never Used  Substance and Sexual Activity   Alcohol use: No    Alcohol/week: 2.0 standard drinks of alcohol    Types: 2 Cans of beer per week   Drug use: No   Sexual activity: Not Currently  Other Topics Concern   Not on file  Social History Narrative   Not on file   Social Determinants of Health   Financial Resource  Strain: Not on file  Food Insecurity: No Food Insecurity (10/26/2022)   Hunger Vital Sign    Worried About Running Out of Food in the Last Year: Never true    Ran Out of Food in the Last Year: Never true  Transportation Needs: No Transportation Needs (10/26/2022)   PRAPARE - Administrator, Civil Service (Medical): No    Lack of Transportation (Non-Medical): No  Physical Activity: Not on file  Stress: Not on file  Social Connections: Not on file     Family History: The patient's family history includes Diabetes in his maternal grandfather and maternal grandmother; Hypertension in his mother; Leukemia in his sister; Stroke in his father.  ROS:   Please see the history of present illness.     All other systems reviewed and are negative.  EKGs/Labs/Other Studies Reviewed:    The following studies were reviewed today:   EKG Interpretation Date/Time:  Thursday January 10 2023 11:40:55 EDT Ventricular Rate:  79 PR Interval:  138 QRS Duration:  86 QT Interval:  398 QTC Calculation: 456 R Axis:   -44  Text Interpretation: Sinus rhythm with frequent Premature ventricular complexes in a pattern of bigeminy Left axis deviation Nonspecific ST abnormality Confirmed by Debbe Odea ([id number]) on 01/10/2023 11:54:31 AM    Recent Labs: 10/30/2022: Magnesium 2.3 01/02/2023: ALT 10; BUN 17; Creatinine, Ser 1.38; Potassium 3.9; Sodium 136 01/03/2023: Hemoglobin 10.7; Platelets 312  Recent Lipid Panel    Component Value Date/Time   CHOL 145 03/31/2018 1429   TRIG 130.0 03/31/2018 1429   HDL 44.70 03/31/2018 1429   CHOLHDL 3 03/31/2018 1429   VLDL 26.0 03/31/2018 1429   LDLCALC 74 03/31/2018 1429   LDLDIRECT 129.6 01/30/2013 0946     Risk Assessment/Calculations:          Physical Exam:    VS:  BP (!) 148/62 (BP Location: Left Arm, Patient Position: Sitting, Cuff Size: Normal)   Pulse 79   Ht 5\' 9"  (1.753 m)   Wt 143 lb (64.9 kg)   SpO2 98%   BMI 21.12 kg/m      Wt Readings from Last 3 Encounters:  01/10/23 143 lb (64.9 kg)  12/05/22 145 lb (65.8 kg)  11/23/22 145 lb (65.8 kg)     GEN:  Well nourished, soft-spoken HEENT: Normal NECK: No JVD; No carotid bruits CARDIAC: No murmurs, occasional skipped heartbeats, RESPIRATORY:  Clear to auscultation without rales, wheezing or rhonchi  ABDOMEN: Soft, non-tender, non-distended MUSCULOSKELETAL:  No edema; No deformity  SKIN:  Warm and dry NEUROLOGIC:  Alert and oriented x 3 PSYCHIATRIC:  Normal affect   ASSESSMENT:    1. Frequent PVCs   2. PSVT (paroxysmal supraventricular tachycardia) (HCC)     PLAN:    In order of problems listed above:  History of paroxysmal SVT, frequent PVCs, nonsustained.  Not on AV nodal agents due to history of bradycardia and sinus pauses.  Evaluated by EP, recommending against antiarrhythmic therapy or aggressive interventions.  Continue to monitor patient off AV nodal agents, EKG today showing frequent PVCs.  Echo 8/24 EF 55 to 60%.   Follow-up in 12 months.      Medication Adjustments/Labs and Tests Ordered: Current medicines are reviewed at length with the patient today.  Concerns regarding medicines are outlined above.  Orders Placed This Encounter  Procedures   EKG 12-Lead   No orders of the defined types were placed in this encounter.    Patient Instructions  Medication Instructions:  Your Physician recommend you continue on your current medication as directed.    *If you need a refill on your cardiac medications before your next appointment, please call your pharmacy*   Lab Work: None ordered  If you have labs (blood work) drawn today and your tests are completely normal, you will receive your results only by: MyChart Message (if you have MyChart) OR A paper copy in the mail If you have any lab test that is abnormal or we need to change your treatment, we will call you to review the results.   Testing/Procedures: None  ordered   Follow-Up: At [facility], you and your health needs are our priority.  As part of our continuing mission to provide you with exceptional heart care, we have created designated Provider Care Teams.  These Care Teams include your primary Cardiologist (physician) and Advanced Practice Providers (APPs -  Physician Assistants and Nurse Practitioners) who all work together to provide you with the care you need, when you need it.  We recommend signing up for the patient portal called "MyChart".  Sign up information is provided on this After Visit Summary.  MyChart is used to connect with patients for Virtual Visits (Telemedicine).  Patients are able to view lab/test results, encounter notes, upcoming appointments, etc.  Non-urgent messages can be sent to your provider as well.   To learn more about what you can do with MyChart, go to [email or web address].    Your next appointment:   1 year(s)  Provider:   You may see Debbe Odea, MD or one of the following Advanced Practice Providers on your designated Care Team:   Nicolasa Ducking, NP Eula Listen, PA-C Cadence Fransico Michael, PA-C Charlsie Quest, NP        Signed, Debbe Odea, MD  01/10/2023 1:03 PM     [facility]

## 2023-01-10 NOTE — Telephone Encounter (Signed)
Called Crystal with [facility], advised that per the AVS no medication changes were made.   They are faxing over the form that they have and asked [place] to clarify with Dr.Agbor-Etang. Gave fax number, will await fax.  Thanks!

## 2023-01-16 ENCOUNTER — Other Ambulatory Visit: Payer: Medicare Other

## 2023-01-16 ENCOUNTER — Inpatient Hospital Stay (HOSPITAL_BASED_OUTPATIENT_CLINIC_OR_DEPARTMENT_OTHER): Payer: Medicare Other | Admitting: Oncology

## 2023-01-16 ENCOUNTER — Encounter: Payer: Self-pay | Admitting: Oncology

## 2023-01-16 ENCOUNTER — Inpatient Hospital Stay: Payer: Medicare Other

## 2023-01-16 VITALS — BP 148/57 | HR 73 | Temp 98.0°F | Wt 145.0 lb

## 2023-01-16 DIAGNOSIS — D5912 Cold autoimmune hemolytic anemia: Secondary | ICD-10-CM | POA: Diagnosis not present

## 2023-01-16 DIAGNOSIS — D591 Autoimmune hemolytic anemia, unspecified: Secondary | ICD-10-CM | POA: Diagnosis not present

## 2023-01-16 DIAGNOSIS — N179 Acute kidney failure, unspecified: Secondary | ICD-10-CM | POA: Diagnosis not present

## 2023-01-16 LAB — COMPREHENSIVE METABOLIC PANEL
ALT: 11 U/L (ref 0–44)
AST: 17 U/L (ref 15–41)
Albumin: 3.8 g/dL (ref 3.5–5.0)
Alkaline Phosphatase: 80 U/L (ref 38–126)
Anion gap: 5 (ref 5–15)
BUN: 13 mg/dL (ref 8–23)
CO2: 24 mmol/L (ref 22–32)
Calcium: 8.8 mg/dL — ABNORMAL LOW (ref 8.9–10.3)
Chloride: 106 mmol/L (ref 98–111)
Creatinine, Ser: 1.42 mg/dL — ABNORMAL HIGH (ref 0.61–1.24)
GFR, Estimated: 52 mL/min — ABNORMAL LOW (ref >60)
Glucose, Bld: 92 mg/dL (ref 70–99)
Potassium: 3.8 mmol/L (ref 3.5–5.1)
Sodium: 135 mmol/L (ref 135–145)
Total Bilirubin: 1.8 mg/dL — ABNORMAL HIGH (ref 0.3–1.2)
Total Protein: 6.7 g/dL (ref 6.5–8.1)

## 2023-01-16 LAB — CBC WITH DIFFERENTIAL/PLATELET
Abs Immature Granulocytes: 0.08 10*3/uL — ABNORMAL HIGH (ref 0.00–0.07)
Basophils Absolute: 0.1 10*3/uL (ref 0.0–0.1)
Basophils Relative: 1 %
Eosinophils Absolute: 0.1 10*3/uL (ref 0.0–0.5)
Eosinophils Relative: 1 %
HCT: 32.5 % — ABNORMAL LOW (ref 39.0–52.0)
Hemoglobin: 11 g/dL — ABNORMAL LOW (ref 13.0–17.0)
Immature Granulocytes: 1 %
Lymphocytes Relative: 15 %
Lymphs Abs: 1.4 10*3/uL (ref 0.7–4.0)
MCH: 32.1 pg (ref 26.0–34.0)
MCHC: 33.8 g/dL (ref 30.0–36.0)
MCV: 94.8 fL (ref 80.0–100.0)
Monocytes Absolute: 0.9 10*3/uL (ref 0.1–1.0)
Monocytes Relative: 10 %
Neutro Abs: 6.6 10*3/uL (ref 1.7–7.7)
Neutrophils Relative %: 72 %
Platelets: 373 10*3/uL (ref 150–400)
RBC: 3.43 MIL/uL — ABNORMAL LOW (ref 4.22–5.81)
RDW: 13.2 % (ref 11.5–15.5)
Smear Review: NORMAL
WBC: 7.8 10*3/uL (ref 4.0–10.5)
nRBC: 0 % (ref 0.0–0.2)

## 2023-01-16 NOTE — Progress Notes (Signed)
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 His hemoglobin at that time was stable around 10 and he has never required any treatment for cold agglutinin disease. He has had a workup for cold agglutinin including negative ANA, normal C3-C4 as well as negative hepatitis B and C testing. Myeloma panel did not reveal any M protein. Rheumatoid factor testing at that time was negative.  CT chest abdomen pelvis done back in 2018 did not reveal any evidence of lymphoproliferative disorder.  Subsequently he was being followed by his primary care doctor and recently he was admitted to the hospital for low hemoglobin. His hemoglobin which was 9.1 a year ago had dropped down to 7.1 this year.  Patient was given blood transfusion and workup for cold agglutinin hemolytic anemia was positive.  Coombs test was positive for complement and negative for IgG.  Cold agglutinin titers wereHigh and positive at 1: 4096.  Iron studies were indicative of anemia of chronic disease B12 and folate levels were normal.  CT chest abdomen pelvis with contrast in August 2024 did not reveal any evidence of malignancy.  Incidental subsegmental PE for which she is  on Eliquis   Interval history-no acute issues since his last visit.  No recent hospitalizations.  He has chronic fatigue  ECOG PS- 3 Pain scale- 0   Review of systems- Review of Systems  Constitutional:  Positive for malaise/fatigue. Negative for chills, fever and weight loss.  HENT:  Negative for congestion, ear discharge and nosebleeds.   Eyes:  Negative for blurred vision.  Respiratory:  Negative for cough, hemoptysis, sputum production, shortness of breath and wheezing.   Cardiovascular:  Negative for chest pain, palpitations, orthopnea and claudication.  Gastrointestinal:  Negative for abdominal pain, blood in stool, constipation, diarrhea, heartburn, melena, nausea and vomiting.  Genitourinary:  Negative for dysuria, flank pain, frequency, hematuria and urgency.  Musculoskeletal:  Negative for back pain, joint pain and myalgias.  Skin:  Negative for rash.  Neurological:  Negative for dizziness, tingling, focal weakness, seizures, weakness and headaches.  Endo/Heme/Allergies:  Does not bruise/bleed easily.  Psychiatric/Behavioral:  Negative for depression and suicidal ideas. The patient does not have insomnia.       Allergies  Allergen Reactions   Gabapentin     Other reaction(s): Dizziness   Pregabalin     Other reaction(s): Dizziness     Past Medical History:  Diagnosis Date   Depression    Hypercholesterolemia    Hypertension    Multiple sclerosis (HCC)    Sees Dr [facility])   Prostate cancer [facility])    Followed by Dr Achilles Dunk and Dr Doylene Canning     Past Surgical History:  Procedure Laterality Date   KNEE SURGERY      Social History   Socioeconomic History   Marital  status: Divorced    Spouse name: Not on file   Number of children: Not on file   Years of education: Not on file   Highest education level: Not on file  Occupational History   Not on file  Tobacco Use   Smoking status: Never   Smokeless tobacco: Current    Types: Chew   Tobacco  comments:    Is going to try and cut down on the amount.   Vaping Use   Vaping status: Never Used  Substance and Sexual Activity   Alcohol use: No    Alcohol/week: 2.0 standard drinks of alcohol    Types: 2 Cans of beer per week   Drug use: No   Sexual activity: Not Currently  Other Topics Concern   Not on file  Social History Narrative   Not on file   Social Determinants of Health   Financial Resource Strain: Not on file  Food Insecurity: No Food Insecurity (10/26/2022)   Hunger Vital Sign    Worried About Running Out of Food in the Last Year: Never true    Ran Out of Food in the Last Year: Never true  Transportation Needs: No Transportation Needs (10/26/2022)   PRAPARE - Administrator, Civil Service (Medical): No    Lack of Transportation (Non-Medical): No  Physical Activity: Not on file  Stress: Not on file  Social Connections: Not on file  Intimate Partner Violence: Not At Risk (10/26/2022)   Humiliation, Afraid, Rape, and Kick questionnaire    Fear of Current or Ex-Partner: No    Emotionally Abused: No    Physically Abused: No    Sexually Abused: No    Family History  Problem Relation Age of Onset   Hypertension Mother    Stroke Father    Diabetes Maternal Grandmother    Diabetes Maternal Grandfather    Leukemia Sister        died - age 48     Current Outpatient Medications:    acetaminophen (TYLENOL) 325 MG tablet, Take by mouth every 6 (six) hours as needed. As needed for pain, Disp: , Rfl:    apixaban (ELIQUIS) 5 MG TABS tablet, Take 5 mg by mouth 2 (two) times daily., Disp: , Rfl:    cetirizine (ZYRTEC) 10 MG tablet, Take 10 mg by mouth daily., Disp: , Rfl:    dextromethorphan (DELSYM) 30 MG/5ML liquid, Take 10 mLs by mouth every 12 (twelve) hours as needed (cough)., Disp: , Rfl:    fenofibrate 54 MG tablet, Take 54 mg by mouth daily., Disp: , Rfl:    finasteride (PROSCAR) 5 MG tablet, Take 1 tablet (5 mg total) by mouth daily., Disp: 90 tablet,  Rfl: 3   fluticasone (FLONASE) 50 MCG/ACT nasal spray, Place 2 sprays into both nostrils daily., Disp: 16 g, Rfl: 6   latanoprost (XALATAN) 0.005 % ophthalmic solution, Place 1 drop into both eyes at bedtime., Disp: , Rfl:    lidocaine (LIDODERM) 5 %, Place 1 patch onto the skin daily. Remove & Discard patch within 12 hours or as directed by MD, Disp: , Rfl:    melatonin 3 MG TABS tablet, Take 3 mg by mouth at bedtime., Disp: , Rfl:    midodrine (PROAMATINE) 5 MG tablet, Take 1 tablet (5 mg total) by mouth 3 (three) times daily as needed (SBP <100)., Disp: , Rfl:    mirtazapine (REMERON) 15 MG tablet, Take 15 mg by mouth at bedtime., Disp: , Rfl:  Multiple Vitamin (MULTIVITAMIN) capsule, Take 1 capsule by mouth daily., Disp: , Rfl:    naloxone (NARCAN) nasal spray 4 mg/0.1 mL, Place 1 spray into the nose 3 (three) times daily as needed., Disp: , Rfl:    Polyethyl Glycol-Propyl Glycol (SYSTANE) 0.4-0.3 % SOLN, Place 1 drop into both eyes 2 (two) times daily in the am and at bedtime.., Disp: , Rfl:    sertraline (ZOLOFT) 25 MG tablet, Take 1 tablet (25 mg total) by mouth daily., Disp: 90 tablet, Rfl: 1   tiZANidine (ZANAFLEX) 2 MG tablet, Take 2 mg by mouth at bedtime., Disp: , Rfl:    vitamin B-12 (CYANOCOBALAMIN) 1000 MCG tablet, Take 1 tablet (1,000 mcg total) by mouth daily. (Patient not taking: Reported on 01/10/2023), Disp: 90 tablet, Rfl: 3  Physical exam:  Vitals:   01/16/23 1032  BP: (!) 148/57  Pulse: 73  Temp: 98 F (36.7 C)  TempSrc: Tympanic  SpO2: 99%  Weight: 145 lb (65.8 kg)   Physical Exam Constitutional:      Comments: Sitting in a wheelchair appears in no acute distress  Cardiovascular:     Rate and Rhythm: Normal rate and regular rhythm.     Heart sounds: Normal heart sounds.  Pulmonary:     Effort: Pulmonary effort is normal.     Breath sounds: Normal breath sounds.  Skin:    General: Skin is warm and dry.  Neurological:     Mental Status: He is alert and  oriented to person, place, and time.         Latest Ref Rng & Units 01/16/2023    9:59 AM  CMP  Glucose 70 - 99 mg/dL 92   BUN 8 - 23 mg/dL 13   Creatinine 5.78 - 1.24 mg/dL 4.69   Sodium 629 - 528 mmol/L 135   Potassium 3.5 - 5.1 mmol/L 3.8   Chloride 98 - 111 mmol/L 106   CO2 22 - 32 mmol/L 24   Calcium 8.9 - 10.3 mg/dL 8.8   Total Protein 6.5 - 8.1 g/dL 6.7   Total Bilirubin 0.3 - 1.2 mg/dL 1.8   Alkaline Phos 38 - 126 U/L 80   AST 15 - 41 U/L 17   ALT 0 - 44 U/L 11       Latest Ref Rng & Units 01/16/2023    9:59 AM  CBC  WBC 4.0 - 10.5 K/uL 7.8   Hemoglobin 13.0 - 17.0 g/dL 41.3   Hematocrit 24.4 - 52.0 % 32.5   Platelets 150 - 400 K/uL 373     No images are attached to the encounter.  No results found.   Assessment and plan- Patient is a 72 y.o. male with history of agglutinin lytic anemiaFor a routine follow-up  We have managed his cold agglutinin hemolytic anemia conservatively and he has not required any Rituxan so far.  His hemoglobin today is 11 and overall stable in the last 4 months.  He has not required any blood transfusions.  Cold agglutinin titers are coming down and total bilirubin is trending down as well.  I will continue to monitor his CBC once a month and see him back in 3 months.  I will repeat cold agglutinin titers again in 2 months  Patient does have evidence of AKI with elevated serum creatinine of 1.4.  At baseline his creatinine is between 1-1.1.  I have encouraged him to and improve his oral fluid intake and he needs to follow-up with his primary  care doctor for his kidney functions.   Visit Diagnosis 1. Autoimmune hemolytic anemia (HCC)   2. 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HCC)   3. AKI (acute kidney injury) (HCC)      Dr. Owens Shark, MD, MPH [facility] [phone] 01/16/2023 1:36 PM

## 2023-01-17 LAB — HAPTOGLOBIN: Haptoglobin: <10 mg/dL — ABNORMAL LOW (ref 34–355)

## 2023-02-13 ENCOUNTER — Inpatient Hospital Stay: Payer: Medicare Other | Attending: Oncology

## 2023-02-13 DIAGNOSIS — D5912 Cold autoimmune hemolytic anemia: Secondary | ICD-10-CM | POA: Insufficient documentation

## 2023-02-13 DIAGNOSIS — I2693 Single subsegmental pulmonary embolism without acute cor pulmonale: Secondary | ICD-10-CM | POA: Insufficient documentation

## 2023-02-13 DIAGNOSIS — D591 Autoimmune hemolytic anemia, unspecified: Secondary | ICD-10-CM

## 2023-02-13 DIAGNOSIS — Z7901 Long term (current) use of anticoagulants: Secondary | ICD-10-CM | POA: Diagnosis not present

## 2023-02-13 LAB — COMPREHENSIVE METABOLIC PANEL
ALT: 10 U/L (ref 0–44)
AST: 14 U/L — ABNORMAL LOW (ref 15–41)
Albumin: 3.8 g/dL (ref 3.5–5.0)
Alkaline Phosphatase: 79 U/L (ref 38–126)
Anion gap: 7 (ref 5–15)
BUN: 13 mg/dL (ref 8–23)
CO2: 26 mmol/L (ref 22–32)
Calcium: 9 mg/dL (ref 8.9–10.3)
Chloride: 107 mmol/L (ref 98–111)
Creatinine, Ser: 1.25 mg/dL — ABNORMAL HIGH (ref 0.61–1.24)
GFR, Estimated: >60 mL/min (ref >60)
Glucose, Bld: 81 mg/dL (ref 70–99)
Potassium: 4.4 mmol/L (ref 3.5–5.1)
Sodium: 140 mmol/L (ref 135–145)
Total Bilirubin: 1.3 mg/dL — ABNORMAL HIGH (ref <1.2)
Total Protein: 6.2 g/dL — ABNORMAL LOW (ref 6.5–8.1)

## 2023-02-13 LAB — CBC WITH DIFFERENTIAL/PLATELET
Abs Immature Granulocytes: 0.02 10*3/uL (ref 0.00–0.07)
Basophils Absolute: 0.1 10*3/uL (ref 0.0–0.1)
Basophils Relative: 2 %
Eosinophils Absolute: 0.1 10*3/uL (ref 0.0–0.5)
Eosinophils Relative: 2 %
HCT: 27.8 % — ABNORMAL LOW (ref 39.0–52.0)
Hemoglobin: 9.9 g/dL — ABNORMAL LOW (ref 13.0–17.0)
Immature Granulocytes: 0 %
Lymphocytes Relative: 24 %
Lymphs Abs: 1.1 10*3/uL (ref 0.7–4.0)
MCH: 36.7 pg — ABNORMAL HIGH (ref 26.0–34.0)
MCHC: 35.6 g/dL (ref 30.0–36.0)
MCV: 103 fL — ABNORMAL HIGH (ref 80.0–100.0)
Monocytes Absolute: 0.6 10*3/uL (ref 0.1–1.0)
Monocytes Relative: 13 %
Neutro Abs: 2.8 10*3/uL (ref 1.7–7.7)
Neutrophils Relative %: 59 %
Platelets: 304 10*3/uL (ref 150–400)
RBC: 2.7 MIL/uL — ABNORMAL LOW (ref 4.22–5.81)
RDW: 15.1 % (ref 11.5–15.5)
WBC: 4.8 10*3/uL (ref 4.0–10.5)
nRBC: 0 % (ref 0.0–0.2)

## 2023-02-14 LAB — HAPTOGLOBIN: Haptoglobin: <10 mg/dL — ABNORMAL LOW (ref 34–355)

## 2023-02-15 LAB — COLD AGGLUTININ TITER: Cold Agglutinin Titer: 1:2048 {titer} — ABNORMAL HIGH

## 2023-03-12 ENCOUNTER — Other Ambulatory Visit: Payer: Self-pay

## 2023-03-12 DIAGNOSIS — D591 Autoimmune hemolytic anemia, unspecified: Secondary | ICD-10-CM

## 2023-03-13 ENCOUNTER — Inpatient Hospital Stay: Payer: Medicare Other | Attending: Oncology

## 2023-03-13 DIAGNOSIS — D5912 Cold autoimmune hemolytic anemia: Secondary | ICD-10-CM | POA: Insufficient documentation

## 2023-03-13 DIAGNOSIS — D591 Autoimmune hemolytic anemia, unspecified: Secondary | ICD-10-CM

## 2023-03-13 DIAGNOSIS — Z7901 Long term (current) use of anticoagulants: Secondary | ICD-10-CM | POA: Insufficient documentation

## 2023-03-13 DIAGNOSIS — I2693 Single subsegmental pulmonary embolism without acute cor pulmonale: Secondary | ICD-10-CM | POA: Insufficient documentation

## 2023-03-13 LAB — CBC WITH DIFFERENTIAL (CANCER CENTER ONLY)
Abs Immature Granulocytes: 0.04 10*3/uL (ref 0.00–0.07)
Basophils Absolute: 0.1 10*3/uL (ref 0.0–0.1)
Basophils Relative: 1 %
Eosinophils Absolute: 0.1 10*3/uL (ref 0.0–0.5)
Eosinophils Relative: 1 %
HCT: 29.7 % — ABNORMAL LOW (ref 39.0–52.0)
Hemoglobin: 10.9 g/dL — ABNORMAL LOW (ref 13.0–17.0)
Immature Granulocytes: 1 %
Lymphocytes Relative: 24 %
Lymphs Abs: 1.4 10*3/uL (ref 0.7–4.0)
MCH: 38.4 pg — ABNORMAL HIGH (ref 26.0–34.0)
MCHC: 36.7 g/dL — ABNORMAL HIGH (ref 30.0–36.0)
MCV: 104.6 fL — ABNORMAL HIGH (ref 80.0–100.0)
Monocytes Absolute: 0.7 10*3/uL (ref 0.1–1.0)
Monocytes Relative: 12 %
Neutro Abs: 3.6 10*3/uL (ref 1.7–7.7)
Neutrophils Relative %: 61 %
Platelet Count: 296 10*3/uL (ref 150–400)
RBC: 2.84 MIL/uL — ABNORMAL LOW (ref 4.22–5.81)
RDW: 18.6 % — ABNORMAL HIGH (ref 11.5–15.5)
WBC Count: 5.9 10*3/uL (ref 4.0–10.5)
nRBC: 0 % (ref 0.0–0.2)

## 2023-03-14 LAB — HAPTOGLOBIN: Haptoglobin: <10 mg/dL — ABNORMAL LOW (ref 34–355)

## 2023-03-15 LAB — COLD AGGLUTININ TITER: Cold Agglutinin Titer: >1:4096 {titer}

## 2023-04-22 ENCOUNTER — Encounter: Payer: Self-pay | Admitting: Oncology

## 2023-04-22 ENCOUNTER — Inpatient Hospital Stay: Payer: Medicare Other | Attending: Oncology

## 2023-04-22 ENCOUNTER — Inpatient Hospital Stay (HOSPITAL_BASED_OUTPATIENT_CLINIC_OR_DEPARTMENT_OTHER): Payer: Medicare Other | Admitting: Oncology

## 2023-04-22 VITALS — HR 85 | Temp 97.4°F | Resp 17 | Wt 138.0 lb

## 2023-04-22 DIAGNOSIS — D5912 Cold autoimmune hemolytic anemia: Secondary | ICD-10-CM

## 2023-04-22 DIAGNOSIS — D591 Autoimmune hemolytic anemia, unspecified: Secondary | ICD-10-CM

## 2023-04-22 DIAGNOSIS — Z86711 Personal history of pulmonary embolism: Secondary | ICD-10-CM | POA: Insufficient documentation

## 2023-04-22 DIAGNOSIS — Z7901 Long term (current) use of anticoagulants: Secondary | ICD-10-CM | POA: Insufficient documentation

## 2023-04-22 LAB — CBC WITH DIFFERENTIAL/PLATELET
Abs Immature Granulocytes: 0.05 10*3/uL (ref 0.00–0.07)
Basophils Absolute: 0 10*3/uL (ref 0.0–0.1)
Basophils Relative: 1 %
Eosinophils Absolute: 0.1 10*3/uL (ref 0.0–0.5)
Eosinophils Relative: 1 %
HCT: 24.1 % — ABNORMAL LOW (ref 39.0–52.0)
Hemoglobin: 8.5 g/dL — ABNORMAL LOW (ref 13.0–17.0)
Immature Granulocytes: 1 %
Lymphocytes Relative: 13 %
Lymphs Abs: 1 10*3/uL (ref 0.7–4.0)
MCH: 35.6 pg — ABNORMAL HIGH (ref 26.0–34.0)
MCHC: 35.3 g/dL (ref 30.0–36.0)
MCV: 100.8 fL — ABNORMAL HIGH (ref 80.0–100.0)
Monocytes Absolute: 0.8 10*3/uL (ref 0.1–1.0)
Monocytes Relative: 11 %
Neutro Abs: 5.6 10*3/uL (ref 1.7–7.7)
Neutrophils Relative %: 73 %
Platelets: 327 10*3/uL (ref 150–400)
RBC: 2.39 MIL/uL — ABNORMAL LOW (ref 4.22–5.81)
RDW: 15.9 % — ABNORMAL HIGH (ref 11.5–15.5)
WBC: 7.6 10*3/uL (ref 4.0–10.5)
nRBC: 0 % (ref 0.0–0.2)

## 2023-04-22 LAB — COMPREHENSIVE METABOLIC PANEL
ALT: 13 U/L (ref 0–44)
AST: 27 U/L (ref 15–41)
Albumin: 3.7 g/dL (ref 3.5–5.0)
Alkaline Phosphatase: 88 U/L (ref 38–126)
Anion gap: 9 (ref 5–15)
BUN: 24 mg/dL — ABNORMAL HIGH (ref 8–23)
CO2: 23 mmol/L (ref 22–32)
Calcium: 8.9 mg/dL (ref 8.9–10.3)
Chloride: 106 mmol/L (ref 98–111)
Creatinine, Ser: 1.32 mg/dL — ABNORMAL HIGH (ref 0.61–1.24)
GFR, Estimated: 57 mL/min — ABNORMAL LOW (ref >60)
Glucose, Bld: 113 mg/dL — ABNORMAL HIGH (ref 70–99)
Potassium: 3.7 mmol/L (ref 3.5–5.1)
Sodium: 138 mmol/L (ref 135–145)
Total Bilirubin: 1.9 mg/dL — ABNORMAL HIGH (ref 0.0–1.2)
Total Protein: 6.3 g/dL — ABNORMAL LOW (ref 6.5–8.1)

## 2023-04-22 NOTE — Progress Notes (Unsigned)
Patient here for oncology follow-up appointment, concerns of headache, running nose, cough and congestion 1 week

## 2023-04-22 NOTE — Progress Notes (Unsigned)
Hematology/Oncology Consult note [facility]  Telephone:([phone] [phone]  Patient Care Team: Rosetta Posner, MD as PCP - General (Geriatric Medicine) Debbe Odea, MD as PCP - Cardiology (Cardiology) Rosetta Posner, MD as Referring Physician (Geriatric Medicine) Reginold Agent, Rocky Crafts, NP as Nurse Practitioner (Nurse Practitioner) Creig Hines, MD as Consulting Physician (Oncology)   Name of the patient: Philip Gordon  [id number]  10/06/1950   Date of visit: 04/22/23  Diagnosis-  history of cold agglutinin hemolytic anemia   Chief complaint/ Reason for visit-routine follow-up of cold agglutinin hemolytic anemia  Heme/Onc history: Patient is a 73 year old male with history of cold agglutinin hemolytic anemia who was last seen by me as an outpatient in September 2019. His hemoglobin at that time was stable around 10 and he has never required any treatment for cold agglutinin disease. He has had a workup for cold agglutinin including negative ANA, normal C3-C4 as well as negative hepatitis B and C testing. Myeloma panel did not reveal any M protein. Rheumatoid factor testing at that time was negative. CT chest abdomen pelvis done back in 2018 did not reveal any evidence of lymphoproliferative disorder. Subsequently he was being followed by his primary care doctor and recently he was admitted to the hospital for low hemoglobin. His hemoglobin which was 9.1 a year ago had dropped down to 7.1 this year. Patient was given blood transfusion and workup for cold agglutinin hemolytic anemia was positive. Coombs test was positive for complement and negative for IgG. Cold agglutinin titers wereHigh and positive at 1: 4096. Iron studies were indicative of anemia of chronic disease B12 and folate levels were normal. CT chest abdomen pelvis with contrast in August 2024 did not reveal any evidence of malignancy. Incidental subsegmental PE for which she is on  Eliquis   Interval history-he is doing relatively well at the nursing home.  No recent falls or infections.  No recent hospitalizations.  He states he tries to keep himself warm and keep the temperature in his room up but the corridors are often cold.  ECOG PS- 3 Pain scale- 0   Review of systems- Review of Systems  Constitutional:  Positive for malaise/fatigue. Negative for chills, fever and weight loss.  HENT:  Negative for congestion, ear discharge and nosebleeds.   Eyes:  Negative for blurred vision.  Respiratory:  Negative for cough, hemoptysis, sputum production, shortness of breath and wheezing.   Cardiovascular:  Negative for chest pain, palpitations, orthopnea and claudication.  Gastrointestinal:  Negative for abdominal pain, blood in stool, constipation, diarrhea, heartburn, melena, nausea and vomiting.  Genitourinary:  Negative for dysuria, flank pain, frequency, hematuria and urgency.  Musculoskeletal:  Negative for back pain, joint pain and myalgias.  Skin:  Negative for rash.  Neurological:  Negative for dizziness, tingling, focal weakness, seizures, weakness and headaches.  Endo/Heme/Allergies:  Does not bruise/bleed easily.  Psychiatric/Behavioral:  Negative for depression and suicidal ideas. The patient does not have insomnia.       Allergies  Allergen Reactions   Gabapentin     Other reaction(s): Dizziness   Pregabalin     Other reaction(s): Dizziness     Past Medical History:  Diagnosis Date   Depression    Hypercholesterolemia    Hypertension    Multiple sclerosis (HCC)    Sees Dr [facility])   Prostate cancer [facility])    Followed by Dr Achilles Dunk and Dr Doylene Canning     Past Surgical History:  Procedure Laterality  Date   KNEE SURGERY      Social History   Socioeconomic History   Marital status: Divorced    Spouse name: Not on file   Number of children: Not on file   Years of education: Not on file   Highest education level: Not on file   Occupational History   Not on file  Tobacco Use   Smoking status: Never   Smokeless tobacco: Current    Types: Chew   Tobacco comments:    Is going to try and cut down on the amount.   Vaping Use   Vaping status: Never Used  Substance and Sexual Activity   Alcohol use: No    Alcohol/week: 2.0 standard drinks of alcohol    Types: 2 Cans of beer per week   Drug use: No   Sexual activity: Not Currently  Other Topics Concern   Not on file  Social History Narrative   Not on file   Social Drivers of Health   Financial Resource Strain: Not on file  Food Insecurity: No Food Insecurity (10/26/2022)   Hunger Vital Sign    Worried About Running Out of Food in the Last Year: Never true    Ran Out of Food in the Last Year: Never true  Transportation Needs: No Transportation Needs (10/26/2022)   PRAPARE - Administrator, Civil Service (Medical): No    Lack of Transportation (Non-Medical): No  Physical Activity: Not on file  Stress: Not on file  Social Connections: Not on file  Intimate Partner Violence: Not At Risk (10/26/2022)   Humiliation, Afraid, Rape, and Kick questionnaire    Fear of Current or Ex-Partner: No    Emotionally Abused: No    Physically Abused: No    Sexually Abused: No    Family History  Problem Relation Age of Onset   Hypertension Mother    Stroke Father    Diabetes Maternal Grandmother    Diabetes Maternal Grandfather    Leukemia Sister        died - age 75     Current Outpatient Medications:    acetaminophen (TYLENOL) 325 MG tablet, Take by mouth every 6 (six) hours as needed. As needed for pain, Disp: , Rfl:    cetirizine (ZYRTEC) 10 MG tablet, Take 10 mg by mouth daily., Disp: , Rfl:    fenofibrate 54 MG tablet, Take 54 mg by mouth daily., Disp: , Rfl:    finasteride (PROSCAR) 5 MG tablet, Take 1 tablet (5 mg total) by mouth daily., Disp: 90 tablet, Rfl: 3   fluticasone (FLONASE) 50 MCG/ACT nasal spray, Place 2 sprays into both nostrils  daily., Disp: 16 g, Rfl: 6   latanoprost (XALATAN) 0.005 % ophthalmic solution, Place 1 drop into both eyes at bedtime., Disp: , Rfl:    lidocaine (LIDODERM) 5 %, Place 1 patch onto the skin daily. Remove & Discard patch within 12 hours or as directed by MD, Disp: , Rfl:    melatonin 3 MG TABS tablet, Take 3 mg by mouth at bedtime., Disp: , Rfl:    midodrine (PROAMATINE) 5 MG tablet, Take 1 tablet (5 mg total) by mouth 3 (three) times daily as needed (SBP <100)., Disp: , Rfl:    mirtazapine (REMERON) 15 MG tablet, Take 15 mg by mouth at bedtime., Disp: , Rfl:    Multiple Vitamin (MULTIVITAMIN) capsule, Take 1 capsule by mouth daily., Disp: , Rfl:    Polyethyl Glycol-Propyl Glycol (SYSTANE) 0.4-0.3 % SOLN, Place  1 drop into both eyes 2 (two) times daily in the am and at bedtime.., Disp: , Rfl:    sertraline (ZOLOFT) 25 MG tablet, Take 1 tablet (25 mg total) by mouth daily., Disp: 90 tablet, Rfl: 1   tiZANidine (ZANAFLEX) 2 MG tablet, Take 2 mg by mouth at bedtime., Disp: , Rfl:    apixaban (ELIQUIS) 5 MG TABS tablet, Take 5 mg by mouth 2 (two) times daily. (Patient not taking: Reported on 04/22/2023), Disp: , Rfl:    dextromethorphan (DELSYM) 30 MG/5ML liquid, Take 10 mLs by mouth every 12 (twelve) hours as needed (cough). (Patient not taking: Reported on 04/22/2023), Disp: , Rfl:    naloxone (NARCAN) nasal spray 4 mg/0.1 mL, Place 1 spray into the nose 3 (three) times daily as needed. (Patient not taking: Reported on 04/22/2023), Disp: , Rfl:    vitamin B-12 (CYANOCOBALAMIN) 1000 MCG tablet, Take 1 tablet (1,000 mcg total) by mouth daily. (Patient not taking: Reported on 01/10/2023), Disp: 90 tablet, Rfl: 3  Physical exam:  Vitals:   04/22/23 1056  Pulse: 85  Resp: 17  Temp: (!) 97.4 F (36.3 C)  TempSrc: Oral  SpO2: 99%  Weight: 138 lb (62.6 kg)   Physical Exam Constitutional:      Comments: Sitting in a wheelchair and appears in no acute distress  Cardiovascular:     Rate and Rhythm:  Normal rate and regular rhythm.     Heart sounds: Normal heart sounds.  Pulmonary:     Effort: Pulmonary effort is normal.     Breath sounds: Normal breath sounds.  Abdominal:     General: Bowel sounds are normal.     Palpations: Abdomen is soft.  Skin:    General: Skin is warm and dry.  Neurological:     Mental Status: He is alert and oriented to person, place, and time.         Latest Ref Rng & Units 04/22/2023   10:01 AM  CMP  Glucose 70 - 99 mg/dL 034   BUN 8 - 23 mg/dL 24   Creatinine 7.42 - 1.24 mg/dL 5.95   Sodium 638 - 756 mmol/L 138   Potassium 3.5 - 5.1 mmol/L 3.7   Chloride 98 - 111 mmol/L 106   CO2 22 - 32 mmol/L 23   Calcium 8.9 - 10.3 mg/dL 8.9   Total Protein 6.5 - 8.1 g/dL 6.3   Total Bilirubin 0.0 - 1.2 mg/dL 1.9   Alkaline Phos 38 - 126 U/L 88   AST 15 - 41 U/L 27   ALT 0 - 44 U/L 13       Latest Ref Rng & Units 04/22/2023   10:01 AM  CBC  WBC 4.0 - 10.5 K/uL 7.6   Hemoglobin 13.0 - 17.0 g/dL 8.5   Hematocrit 43.3 - 52.0 % 24.1   Platelets 150 - 400 K/uL 327      Assessment and plan- Patient is a 73 y.o. male with history of cold agglutinin hemolytic anemia under conservative management here for routine follow-up  At baseline patient's hemoglobin Runs around 10 and he has had chronic cold agglutinin hemolytic anemia with an undetectable haptoglobin.  Today his hemoglobin is lower at 8.5.  His cold agglutinin titers were again on the rise since November 2024.  We have not treated his cold agglutinin hemolytic anemia with Rituxan so far given his underlying comorbidities.  He does not require a blood transfusion today.  I will continue to monitor his hemoglobin  every 2 weeks and see him back in 2 months with repeat cold agglutinin titers.   Visit Diagnosis 1. Autoimmune hemolytic anemia (HCC)   2. 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HCC)      Dr. Owens Shark, MD, MPH [facility] [phone] 04/22/2023 3:20 PM

## 2023-04-23 LAB — HAPTOGLOBIN: Haptoglobin: <10 mg/dL — ABNORMAL LOW (ref 34–355)

## 2023-05-06 ENCOUNTER — Inpatient Hospital Stay: Payer: Medicare Other | Attending: Oncology

## 2023-05-06 DIAGNOSIS — Z86711 Personal history of pulmonary embolism: Secondary | ICD-10-CM | POA: Diagnosis not present

## 2023-05-06 DIAGNOSIS — D5912 Cold autoimmune hemolytic anemia: Secondary | ICD-10-CM | POA: Insufficient documentation

## 2023-05-06 DIAGNOSIS — D591 Autoimmune hemolytic anemia, unspecified: Secondary | ICD-10-CM

## 2023-05-06 DIAGNOSIS — Z7901 Long term (current) use of anticoagulants: Secondary | ICD-10-CM | POA: Diagnosis not present

## 2023-05-06 LAB — CBC WITH DIFFERENTIAL (CANCER CENTER ONLY)
Abs Immature Granulocytes: 0.08 10*3/uL — ABNORMAL HIGH (ref 0.00–0.07)
Basophils Absolute: 0.1 10*3/uL (ref 0.0–0.1)
Basophils Relative: 1 %
Eosinophils Absolute: 0.1 10*3/uL (ref 0.0–0.5)
Eosinophils Relative: 1 %
HCT: 23.9 % — ABNORMAL LOW (ref 39.0–52.0)
Hemoglobin: 8.2 g/dL — ABNORMAL LOW (ref 13.0–17.0)
Immature Granulocytes: 1 %
Lymphocytes Relative: 20 %
Lymphs Abs: 1.4 10*3/uL (ref 0.7–4.0)
MCH: 35 pg — ABNORMAL HIGH (ref 26.0–34.0)
MCHC: 34.3 g/dL (ref 30.0–36.0)
MCV: 102.1 fL — ABNORMAL HIGH (ref 80.0–100.0)
Monocytes Absolute: 0.6 10*3/uL (ref 0.1–1.0)
Monocytes Relative: 9 %
Neutro Abs: 4.6 10*3/uL (ref 1.7–7.7)
Neutrophils Relative %: 68 %
Platelet Count: 416 10*3/uL — ABNORMAL HIGH (ref 150–400)
RBC: 2.34 MIL/uL — ABNORMAL LOW (ref 4.22–5.81)
RDW: 16.3 % — ABNORMAL HIGH (ref 11.5–15.5)
WBC Count: 6.8 10*3/uL (ref 4.0–10.5)
nRBC: 0 % (ref 0.0–0.2)

## 2023-05-07 ENCOUNTER — Telehealth: Payer: Self-pay | Admitting: *Deleted

## 2023-05-07 NOTE — Telephone Encounter (Signed)
Got a message that the pt . Came over to cancer center on 2/10 and they need the labs labs and when he needs another appts. I sent the fax over to them  and it went through on transmission.

## 2023-05-08 LAB — HAPTOGLOBIN: Haptoglobin: <10 mg/dL — ABNORMAL LOW (ref 34–355)

## 2023-05-16 ENCOUNTER — Telehealth: Payer: Self-pay | Admitting: Oncology

## 2023-05-16 NOTE — Telephone Encounter (Signed)
Lisa from the facility called to confirm patient appointments. She is verifying he has an appointment 3/25 at 12:45 and MD at 2pm. Is this correct? She would like a call back at [phone]

## 2023-05-20 ENCOUNTER — Inpatient Hospital Stay: Payer: Medicare Other

## 2023-05-20 DIAGNOSIS — D591 Autoimmune hemolytic anemia, unspecified: Secondary | ICD-10-CM

## 2023-05-20 DIAGNOSIS — D5912 Cold autoimmune hemolytic anemia: Secondary | ICD-10-CM | POA: Diagnosis not present

## 2023-05-20 LAB — CBC WITH DIFFERENTIAL (CANCER CENTER ONLY)
Abs Immature Granulocytes: 0.04 10*3/uL (ref 0.00–0.07)
Basophils Absolute: 0.1 10*3/uL (ref 0.0–0.1)
Basophils Relative: 1 %
Eosinophils Absolute: 0.1 10*3/uL (ref 0.0–0.5)
Eosinophils Relative: 1 %
HCT: 26.9 % — ABNORMAL LOW (ref 39.0–52.0)
Hemoglobin: 9.6 g/dL — ABNORMAL LOW (ref 13.0–17.0)
Immature Granulocytes: 1 %
Lymphocytes Relative: 27 %
Lymphs Abs: 1.3 10*3/uL (ref 0.7–4.0)
MCH: 37.8 pg — ABNORMAL HIGH (ref 26.0–34.0)
MCHC: 35.7 g/dL (ref 30.0–36.0)
MCV: 105.9 fL — ABNORMAL HIGH (ref 80.0–100.0)
Monocytes Absolute: 0.5 10*3/uL (ref 0.1–1.0)
Monocytes Relative: 10 %
Neutro Abs: 2.8 10*3/uL (ref 1.7–7.7)
Neutrophils Relative %: 60 %
Platelet Count: 308 10*3/uL (ref 150–400)
RBC: 2.54 MIL/uL — ABNORMAL LOW (ref 4.22–5.81)
RDW: 17.4 % — ABNORMAL HIGH (ref 11.5–15.5)
WBC Count: 4.8 10*3/uL (ref 4.0–10.5)
nRBC: 0 % (ref 0.0–0.2)

## 2023-05-20 LAB — COMPREHENSIVE METABOLIC PANEL
ALT: 12 U/L (ref 0–44)
AST: 19 U/L (ref 15–41)
Albumin: 3.7 g/dL (ref 3.5–5.0)
Alkaline Phosphatase: 80 U/L (ref 38–126)
Anion gap: 9 (ref 5–15)
BUN: 12 mg/dL (ref 8–23)
CO2: 23 mmol/L (ref 22–32)
Calcium: 8.7 mg/dL — ABNORMAL LOW (ref 8.9–10.3)
Chloride: 105 mmol/L (ref 98–111)
Creatinine, Ser: 1.52 mg/dL — ABNORMAL HIGH (ref 0.61–1.24)
GFR, Estimated: 48 mL/min — ABNORMAL LOW (ref >60)
Glucose, Bld: 87 mg/dL (ref 70–99)
Potassium: 4 mmol/L (ref 3.5–5.1)
Sodium: 137 mmol/L (ref 135–145)
Total Bilirubin: 1.6 mg/dL — ABNORMAL HIGH (ref 0.0–1.2)
Total Protein: 6.5 g/dL (ref 6.5–8.1)

## 2023-05-22 LAB — COLD AGGLUTININ TITER: Cold Agglutinin Titer: 1:512 {titer} — ABNORMAL HIGH

## 2023-05-22 LAB — HAPTOGLOBIN: Haptoglobin: <10 mg/dL — ABNORMAL LOW (ref 34–355)

## 2023-06-03 ENCOUNTER — Inpatient Hospital Stay: Payer: Medicare Other | Attending: Oncology

## 2023-06-03 DIAGNOSIS — Z8546 Personal history of malignant neoplasm of prostate: Secondary | ICD-10-CM | POA: Diagnosis not present

## 2023-06-03 DIAGNOSIS — D591 Autoimmune hemolytic anemia, unspecified: Secondary | ICD-10-CM

## 2023-06-03 DIAGNOSIS — Z86711 Personal history of pulmonary embolism: Secondary | ICD-10-CM | POA: Insufficient documentation

## 2023-06-03 DIAGNOSIS — G35 Multiple sclerosis: Secondary | ICD-10-CM | POA: Diagnosis not present

## 2023-06-03 DIAGNOSIS — Z806 Family history of leukemia: Secondary | ICD-10-CM | POA: Diagnosis not present

## 2023-06-03 DIAGNOSIS — D5912 Cold autoimmune hemolytic anemia: Secondary | ICD-10-CM | POA: Diagnosis present

## 2023-06-03 LAB — CBC WITH DIFFERENTIAL (CANCER CENTER ONLY)
Abs Immature Granulocytes: 0.05 10*3/uL (ref 0.00–0.07)
Basophils Absolute: 0.1 10*3/uL (ref 0.0–0.1)
Basophils Relative: 1 %
Eosinophils Absolute: 0.1 10*3/uL (ref 0.0–0.5)
Eosinophils Relative: 1 %
HCT: 27.4 % — ABNORMAL LOW (ref 39.0–52.0)
Hemoglobin: 9.3 g/dL — ABNORMAL LOW (ref 13.0–17.0)
Immature Granulocytes: 1 %
Lymphocytes Relative: 18 %
Lymphs Abs: 1.4 10*3/uL (ref 0.7–4.0)
MCH: 31.8 pg (ref 26.0–34.0)
MCHC: 33.9 g/dL (ref 30.0–36.0)
MCV: 93.8 fL (ref 80.0–100.0)
Monocytes Absolute: 0.7 10*3/uL (ref 0.1–1.0)
Monocytes Relative: 9 %
Neutro Abs: 5.5 10*3/uL (ref 1.7–7.7)
Neutrophils Relative %: 70 %
Platelet Count: 333 10*3/uL (ref 150–400)
RBC: 2.92 MIL/uL — ABNORMAL LOW (ref 4.22–5.81)
RDW: 13.3 % (ref 11.5–15.5)
WBC Count: 7.4 10*3/uL (ref 4.0–10.5)
nRBC: 0 % (ref 0.0–0.2)

## 2023-06-03 LAB — CMP (CANCER CENTER ONLY)
ALT: 13 U/L (ref 0–44)
AST: 17 U/L (ref 15–41)
Albumin: 3.8 g/dL (ref 3.5–5.0)
Alkaline Phosphatase: 67 U/L (ref 38–126)
Anion gap: 8 (ref 5–15)
BUN: 15 mg/dL (ref 8–23)
CO2: 25 mmol/L (ref 22–32)
Calcium: 9.1 mg/dL (ref 8.9–10.3)
Chloride: 107 mmol/L (ref 98–111)
Creatinine: 1.33 mg/dL — ABNORMAL HIGH (ref 0.61–1.24)
GFR, Estimated: 56 mL/min — ABNORMAL LOW (ref >60)
Glucose, Bld: 95 mg/dL (ref 70–99)
Potassium: 3.6 mmol/L (ref 3.5–5.1)
Sodium: 140 mmol/L (ref 135–145)
Total Bilirubin: 2.1 mg/dL — ABNORMAL HIGH (ref 0.0–1.2)
Total Protein: 7 g/dL (ref 6.5–8.1)

## 2023-06-05 LAB — HAPTOGLOBIN: Haptoglobin: 15 mg/dL — ABNORMAL LOW (ref 34–355)

## 2023-06-18 ENCOUNTER — Inpatient Hospital Stay: Payer: Medicare Other

## 2023-06-18 ENCOUNTER — Encounter: Payer: Self-pay | Admitting: Oncology

## 2023-06-18 ENCOUNTER — Inpatient Hospital Stay (HOSPITAL_BASED_OUTPATIENT_CLINIC_OR_DEPARTMENT_OTHER): Payer: Medicare Other | Admitting: Oncology

## 2023-06-18 VITALS — BP 138/56 | HR 96 | Temp 97.8°F | Resp 17 | Ht 69.0 in | Wt 135.0 lb

## 2023-06-18 DIAGNOSIS — D5912 Cold autoimmune hemolytic anemia: Secondary | ICD-10-CM

## 2023-06-18 DIAGNOSIS — D591 Autoimmune hemolytic anemia, unspecified: Secondary | ICD-10-CM

## 2023-06-18 LAB — CMP (CANCER CENTER ONLY)
ALT: 12 U/L (ref 0–44)
AST: 20 U/L (ref 15–41)
Albumin: 4 g/dL (ref 3.5–5.0)
Alkaline Phosphatase: 89 U/L (ref 38–126)
Anion gap: 9 (ref 5–15)
BUN: 18 mg/dL (ref 8–23)
CO2: 23 mmol/L (ref 22–32)
Calcium: 9.2 mg/dL (ref 8.9–10.3)
Chloride: 106 mmol/L (ref 98–111)
Creatinine: 1.35 mg/dL — ABNORMAL HIGH (ref 0.61–1.24)
GFR, Estimated: 55 mL/min — ABNORMAL LOW (ref >60)
Glucose, Bld: 93 mg/dL (ref 70–99)
Potassium: 4.6 mmol/L (ref 3.5–5.1)
Sodium: 138 mmol/L (ref 135–145)
Total Bilirubin: 2.6 mg/dL — ABNORMAL HIGH (ref 0.0–1.2)
Total Protein: 7.1 g/dL (ref 6.5–8.1)

## 2023-06-18 LAB — CBC WITH DIFFERENTIAL (CANCER CENTER ONLY)
Abs Immature Granulocytes: 0.06 10*3/uL (ref 0.00–0.07)
Basophils Absolute: 0.1 10*3/uL (ref 0.0–0.1)
Basophils Relative: 1 %
Eosinophils Absolute: 0 10*3/uL (ref 0.0–0.5)
Eosinophils Relative: 1 %
HCT: 26.8 % — ABNORMAL LOW (ref 39.0–52.0)
Hemoglobin: 9.8 g/dL — ABNORMAL LOW (ref 13.0–17.0)
Immature Granulocytes: 1 %
Lymphocytes Relative: 22 %
Lymphs Abs: 1.6 10*3/uL (ref 0.7–4.0)
MCH: 38 pg — ABNORMAL HIGH (ref 26.0–34.0)
MCHC: 36.6 g/dL — ABNORMAL HIGH (ref 30.0–36.0)
MCV: 103.9 fL — ABNORMAL HIGH (ref 80.0–100.0)
Monocytes Absolute: 0.7 10*3/uL (ref 0.1–1.0)
Monocytes Relative: 9 %
Neutro Abs: 5 10*3/uL (ref 1.7–7.7)
Neutrophils Relative %: 66 %
Platelet Count: 384 10*3/uL (ref 150–400)
RBC: 2.58 MIL/uL — ABNORMAL LOW (ref 4.22–5.81)
RDW: 18.2 % — ABNORMAL HIGH (ref 11.5–15.5)
WBC Count: 7.4 10*3/uL (ref 4.0–10.5)
nRBC: 0 % (ref 0.0–0.2)

## 2023-06-18 NOTE — Progress Notes (Signed)
 Hematology/Oncology Consult note [facility]  [phone] [phone]  Patient Care Team: Rosetta Posner, MD as PCP - General (Geriatric Medicine) Debbe Odea, MD as PCP - Cardiology (Cardiology) Rosetta Posner, MD as Referring Physician (Geriatric Medicine) Reginold Agent, Rocky Crafts, NP as Nurse Practitioner (Nurse Practitioner) Creig Hines, MD as Consulting Physician (Oncology)   Name of the patient: Philip Gordon  [id number]  [birth year]   Date of visit: 06/18/23  Diagnosis-  history of cold agglutinin hemolytic anemia   Chief complaint/ Reason for visit-routine follow-up of cold agglutinin hemolytic anemia  Heme/Onc history:  Patient is a 73 year old male with history of cold agglutinin hemolytic anemia who was last seen by me as an outpatient in September 2019. His hemoglobin at that time was stable around 10 and he has never required any treatment for cold agglutinin disease. He has had a workup for cold agglutinin including negative ANA, normal C3-C4 as well as negative hepatitis B and C testing. Myeloma panel did not reveal any M protein. Rheumatoid factor testing at that time was negative. CT chest abdomen pelvis done back in 2018 did not reveal any evidence of lymphoproliferative disorder. Subsequently he was being followed by his primary care doctor and recently he was admitted to the hospital for low hemoglobin. His hemoglobin which was 9.1 a year ago had dropped down to 7.1 this year. Patient was given blood transfusion and workup for cold agglutinin hemolytic anemia was positive. Coombs test was positive for complement and negative for IgG. Cold agglutinin titers wereHigh and positive at 1: 4096. Iron studies were indicative of anemia of chronic disease B12 and folate levels were normal. CT chest abdomen pelvis with contrast in August 2024 did not reveal any evidence of malignancy. Incidental subsegmental PE noted at that time for  which he was on Eliquis briefly and then stopped  Interval history-he resides at nursing home and is generally doing well for his age.  No recent hospitalizations although he has on and off falls.  He keeps himself warm in his room and always wears a shirt  ECOG PS- 3 Pain scale- 0   Review of systems- Review of Systems  Constitutional:  Positive for malaise/fatigue. Negative for chills, fever and weight loss.  HENT:  Negative for congestion, ear discharge and nosebleeds.   Eyes:  Negative for blurred vision.  Respiratory:  Negative for cough, hemoptysis, sputum production, shortness of breath and wheezing.   Cardiovascular:  Negative for chest pain, palpitations, orthopnea and claudication.  Gastrointestinal:  Negative for abdominal pain, blood in stool, constipation, diarrhea, heartburn, melena, nausea and vomiting.  Genitourinary:  Negative for dysuria, flank pain, frequency, hematuria and urgency.  Musculoskeletal:  Negative for back pain, joint pain and myalgias.  Skin:  Negative for rash.  Neurological:  Negative for dizziness, tingling, focal weakness, seizures, weakness and headaches.  Endo/Heme/Allergies:  Does not bruise/bleed easily.  Psychiatric/Behavioral:  Negative for depression and suicidal ideas. The patient does not have insomnia.       Allergies  Allergen Reactions   Gabapentin     Other reaction(s): Dizziness   Pregabalin     Other reaction(s): Dizziness     Past Medical History:  Diagnosis Date   Depression    Hypercholesterolemia    Hypertension    Multiple sclerosis (HCC)    Sees Dr [facility])   Prostate cancer [facility])    Followed by Dr Achilles Dunk and Dr Doylene Canning     Past Surgical History:  Procedure Laterality Date   KNEE SURGERY      Social History   Socioeconomic History   Marital status: Divorced    Spouse name: Not on file   Number of children: Not on file   Years of education: Not on file   Highest education level: Not on file   Occupational History   Not on file  Tobacco Use   Smoking status: Never   Smokeless tobacco: Current    Types: Chew   Tobacco comments:    Is going to try and cut down on the amount.   Vaping Use   Vaping status: Never Used  Substance and Sexual Activity   Alcohol use: No    Alcohol/week: 2.0 standard drinks of alcohol    Types: 2 Cans of beer per week   Drug use: No   Sexual activity: Not Currently  Other Topics Concern   Not on file  Social History Narrative   Not on file   Social Drivers of Health   Financial Resource Strain: Not on file  Food Insecurity: No Food Insecurity (10/26/2022)   Hunger Vital Sign    Worried About Running Out of Food in the Last Year: Never true    Ran Out of Food in the Last Year: Never true  Transportation Needs: No Transportation Needs (10/26/2022)   PRAPARE - Administrator, Civil Service (Medical): No    Lack of Transportation (Non-Medical): No  Physical Activity: Not on file  Stress: Not on file  Social Connections: Not on file  Intimate Partner Violence: Not At Risk (10/26/2022)   Humiliation, Afraid, Rape, and Kick questionnaire    Fear of Current or Ex-Partner: No    Emotionally Abused: No    Physically Abused: No    Sexually Abused: No    Family History  Problem Relation Age of Onset   Hypertension Mother    Stroke Father    Diabetes Maternal Grandmother    Diabetes Maternal Grandfather    Leukemia Sister        died - age 55     Current Outpatient Medications:    ALPRAZolam (XANAX) 0.5 MG tablet, 3 (three) times a day as needed., Disp: , Rfl:    amLODipine (NORVASC) 5 MG tablet, Take by mouth., Disp: , Rfl:    acetaminophen (TYLENOL) 325 MG tablet, Take by mouth every 6 (six) hours as needed. As needed for pain, Disp: , Rfl:    apixaban (ELIQUIS) 5 MG TABS tablet, Take 5 mg by mouth 2 (two) times daily. (Patient not taking: Reported on 04/22/2023), Disp: , Rfl:    cetirizine (ZYRTEC) 10 MG tablet, Take 10 mg by  mouth daily., Disp: , Rfl:    dextromethorphan (DELSYM) 30 MG/5ML liquid, Take 10 mLs by mouth every 12 (twelve) hours as needed (cough). (Patient not taking: Reported on 04/22/2023), Disp: , Rfl:    fenofibrate 54 MG tablet, Take 54 mg by mouth daily., Disp: , Rfl:    finasteride (PROSCAR) 5 MG tablet, Take 1 tablet (5 mg total) by mouth daily., Disp: 90 tablet, Rfl: 3   fluticasone (FLONASE) 50 MCG/ACT nasal spray, Place 2 sprays into both nostrils daily., Disp: 16 g, Rfl: 6   latanoprost (XALATAN) 0.005 % ophthalmic solution, Place 1 drop into both eyes at bedtime., Disp: , Rfl:    lidocaine (LIDODERM) 5 %, Place 1 patch onto the skin daily. Remove & Discard patch within 12 hours or as directed by MD, Disp: , Rfl:  melatonin 3 MG TABS tablet, Take 3 mg by mouth at bedtime., Disp: , Rfl:    midodrine (PROAMATINE) 5 MG tablet, Take 1 tablet (5 mg total) by mouth 3 (three) times daily as needed (SBP <100)., Disp: , Rfl:    mirtazapine (REMERON) 15 MG tablet, Take 15 mg by mouth at bedtime., Disp: , Rfl:    Multiple Vitamin (MULTIVITAMIN) capsule, Take 1 capsule by mouth daily., Disp: , Rfl:    naloxone (NARCAN) nasal spray 4 mg/0.1 mL, Place 1 spray into the nose 3 (three) times daily as needed. (Patient not taking: Reported on 04/22/2023), Disp: , Rfl:    Polyethyl Glycol-Propyl Glycol (SYSTANE) 0.4-0.3 % SOLN, Place 1 drop into both eyes 2 (two) times daily in the am and at bedtime.., Disp: , Rfl:    sertraline (ZOLOFT) 25 MG tablet, Take 1 tablet (25 mg total) by mouth daily., Disp: 90 tablet, Rfl: 1   tiZANidine (ZANAFLEX) 2 MG tablet, Take 2 mg by mouth at bedtime., Disp: , Rfl:    vitamin B-12 (CYANOCOBALAMIN) 1000 MCG tablet, Take 1 tablet (1,000 mcg total) by mouth daily. (Patient not taking: Reported on 01/10/2023), Disp: 90 tablet, Rfl: 3  Physical exam: There were no vitals filed for this visit. Physical Exam Cardiovascular:     Rate and Rhythm: Normal rate and regular rhythm.      Heart sounds: Normal heart sounds.  Pulmonary:     Effort: Pulmonary effort is normal.     Breath sounds: Normal breath sounds.  Abdominal:     General: Bowel sounds are normal.     Palpations: Abdomen is soft.  Skin:    General: Skin is warm and dry.  Neurological:     Mental Status: He is alert and oriented to person, place, and time.         Latest Ref Rng & Units 06/03/2023    9:53 AM  CMP  Glucose 70 - 99 mg/dL 95   BUN 8 - 23 mg/dL 15   Creatinine 4.09 - 1.24 mg/dL 8.11   Sodium 914 - 782 mmol/L 140   Potassium 3.5 - 5.1 mmol/L 3.6   Chloride 98 - 111 mmol/L 107   CO2 22 - 32 mmol/L 25   Calcium 8.9 - 10.3 mg/dL 9.1   Total Protein 6.5 - 8.1 g/dL 7.0   Total Bilirubin 0.0 - 1.2 mg/dL 2.1   Alkaline Phos 38 - 126 U/L 67   AST 15 - 41 U/L 17   ALT 0 - 44 U/L 13       Latest Ref Rng & Units 06/18/2023    1:20 PM  CBC  WBC 4.0 - 10.5 K/uL 7.4   Hemoglobin 13.0 - 17.0 g/dL 9.8   Hematocrit 95.6 - 52.0 % 26.8   Platelets 150 - 400 K/uL 384      Assessment and plan- Patient is a 73 y.o. male here for routine follow-up of cold agglutinin hemolytic anemia  Hemoglobin which was down to 8.2 last month has improved to 9.8 presently.  Cold agglutinin titer and haptoglobin is currently pending.  So far his cold agglutinin hemolytic anemia has been managed conservatively without any active treatment.  If he drifts down to less than 8 I will consider supporting him with blood transfusion instead of initiating treatments like rituximab.  CBC CMP in 2 4 and 6 months and I will see him back in 6 months with cold agglutinin titer   Visit Diagnosis 1. Hemolytic anemia due  to cold antibody (HCC)   2. 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HCC)      Dr. Owens Shark, MD, MPH [facility] [phone] 06/18/2023 1:40 PM

## 2023-06-19 LAB — COLD AGGLUTININ TITER: Cold Agglutinin Titer: 1:512 {titer} — ABNORMAL HIGH

## 2023-06-20 LAB — HAPTOGLOBIN: Haptoglobin: <10 mg/dL — ABNORMAL LOW (ref 34–355)

## 2023-08-20 ENCOUNTER — Inpatient Hospital Stay: Attending: Oncology

## 2023-08-20 DIAGNOSIS — D5912 Cold autoimmune hemolytic anemia: Secondary | ICD-10-CM | POA: Insufficient documentation

## 2023-08-20 LAB — CBC WITH DIFFERENTIAL (CANCER CENTER ONLY)
Abs Immature Granulocytes: 0.03 10*3/uL (ref 0.00–0.07)
Basophils Absolute: 0.1 10*3/uL (ref 0.0–0.1)
Basophils Relative: 1 %
Eosinophils Absolute: 0.1 10*3/uL (ref 0.0–0.5)
Eosinophils Relative: 2 %
HCT: 28.4 % — ABNORMAL LOW (ref 39.0–52.0)
Hemoglobin: 9.9 g/dL — ABNORMAL LOW (ref 13.0–17.0)
Immature Granulocytes: 1 %
Lymphocytes Relative: 27 %
Lymphs Abs: 1.4 10*3/uL (ref 0.7–4.0)
MCH: 35.1 pg — ABNORMAL HIGH (ref 26.0–34.0)
MCHC: 34.9 g/dL (ref 30.0–36.0)
MCV: 100.7 fL — ABNORMAL HIGH (ref 80.0–100.0)
Monocytes Absolute: 0.6 10*3/uL (ref 0.1–1.0)
Monocytes Relative: 12 %
Neutro Abs: 3 10*3/uL (ref 1.7–7.7)
Neutrophils Relative %: 57 %
Platelet Count: 326 10*3/uL (ref 150–400)
RBC: 2.82 MIL/uL — ABNORMAL LOW (ref 4.22–5.81)
RDW: 14.8 % (ref 11.5–15.5)
WBC Count: 5.3 10*3/uL (ref 4.0–10.5)
nRBC: 0 % (ref 0.0–0.2)

## 2023-08-20 LAB — CMP (CANCER CENTER ONLY)
ALT: 14 U/L (ref 0–44)
AST: 24 U/L (ref 15–41)
Albumin: 3.7 g/dL (ref 3.5–5.0)
Alkaline Phosphatase: 92 U/L (ref 38–126)
Anion gap: 6 (ref 5–15)
BUN: 13 mg/dL (ref 8–23)
CO2: 25 mmol/L (ref 22–32)
Calcium: 8.7 mg/dL — ABNORMAL LOW (ref 8.9–10.3)
Chloride: 107 mmol/L (ref 98–111)
Creatinine: 1.22 mg/dL (ref 0.61–1.24)
GFR, Estimated: >60 mL/min (ref >60)
Glucose, Bld: 90 mg/dL (ref 70–99)
Potassium: 4 mmol/L (ref 3.5–5.1)
Sodium: 138 mmol/L (ref 135–145)
Total Bilirubin: 1.5 mg/dL — ABNORMAL HIGH (ref 0.0–1.2)
Total Protein: 6.3 g/dL — ABNORMAL LOW (ref 6.5–8.1)

## 2023-08-21 LAB — HAPTOGLOBIN: Haptoglobin: <10 mg/dL — ABNORMAL LOW (ref 34–355)

## 2023-10-22 ENCOUNTER — Other Ambulatory Visit: Payer: Self-pay

## 2023-10-22 ENCOUNTER — Inpatient Hospital Stay: Attending: Oncology

## 2023-10-22 DIAGNOSIS — D5912 Cold autoimmune hemolytic anemia: Secondary | ICD-10-CM

## 2023-10-22 LAB — CMP (CANCER CENTER ONLY)
ALT: 14 U/L (ref 0–44)
AST: 21 U/L (ref 15–41)
Albumin: 3.9 g/dL (ref 3.5–5.0)
Alkaline Phosphatase: 93 U/L (ref 38–126)
Anion gap: 5 (ref 5–15)
BUN: 16 mg/dL (ref 8–23)
CO2: 25 mmol/L (ref 22–32)
Calcium: 8.9 mg/dL (ref 8.9–10.3)
Chloride: 105 mmol/L (ref 98–111)
Creatinine: 1.45 mg/dL — ABNORMAL HIGH (ref 0.61–1.24)
GFR, Estimated: 50 mL/min — ABNORMAL LOW (ref >60)
Glucose, Bld: 99 mg/dL (ref 70–99)
Potassium: 4.3 mmol/L (ref 3.5–5.1)
Sodium: 135 mmol/L (ref 135–145)
Total Bilirubin: 1.6 mg/dL — ABNORMAL HIGH (ref 0.0–1.2)
Total Protein: 6.6 g/dL (ref 6.5–8.1)

## 2023-10-23 LAB — HAPTOGLOBIN: Haptoglobin: <10 mg/dL — ABNORMAL LOW (ref 34–355)

## 2023-10-27 ENCOUNTER — Ambulatory Visit: Payer: Self-pay | Admitting: Oncology

## 2023-10-29 ENCOUNTER — Inpatient Hospital Stay: Attending: Oncology

## 2023-10-29 DIAGNOSIS — D5912 Cold autoimmune hemolytic anemia: Secondary | ICD-10-CM | POA: Diagnosis present

## 2023-10-29 LAB — CBC WITH DIFFERENTIAL (CANCER CENTER ONLY)
Abs Immature Granulocytes: 0.03 K/uL (ref 0.00–0.07)
Basophils Absolute: 0.1 K/uL (ref 0.0–0.1)
Basophils Relative: 1 %
Eosinophils Absolute: 0.1 K/uL (ref 0.0–0.5)
Eosinophils Relative: 1 %
HCT: 28.1 % — ABNORMAL LOW (ref 39.0–52.0)
Hemoglobin: 10.3 g/dL — ABNORMAL LOW (ref 13.0–17.0)
Immature Granulocytes: 1 %
Lymphocytes Relative: 21 %
Lymphs Abs: 1.3 K/uL (ref 0.7–4.0)
MCH: 38.6 pg — ABNORMAL HIGH (ref 26.0–34.0)
MCHC: 36.7 g/dL — ABNORMAL HIGH (ref 30.0–36.0)
MCV: 105.2 fL — ABNORMAL HIGH (ref 80.0–100.0)
Monocytes Absolute: 0.7 K/uL (ref 0.1–1.0)
Monocytes Relative: 12 %
Neutro Abs: 3.9 K/uL (ref 1.7–7.7)
Neutrophils Relative %: 64 %
Platelet Count: 311 K/uL (ref 150–400)
RBC: 2.67 MIL/uL — ABNORMAL LOW (ref 4.22–5.81)
RDW: 19.1 % — ABNORMAL HIGH (ref 11.5–15.5)
WBC Count: 6 K/uL (ref 4.0–10.5)
nRBC: 0 % (ref 0.0–0.2)

## 2023-12-24 ENCOUNTER — Inpatient Hospital Stay (HOSPITAL_BASED_OUTPATIENT_CLINIC_OR_DEPARTMENT_OTHER): Admitting: Oncology

## 2023-12-24 ENCOUNTER — Inpatient Hospital Stay: Attending: Oncology

## 2023-12-24 ENCOUNTER — Encounter: Payer: Self-pay | Admitting: Oncology

## 2023-12-24 VITALS — BP 136/63 | HR 59 | Temp 97.8°F | Resp 20

## 2023-12-24 DIAGNOSIS — D5912 Cold autoimmune hemolytic anemia: Secondary | ICD-10-CM | POA: Diagnosis not present

## 2023-12-24 DIAGNOSIS — R5383 Other fatigue: Secondary | ICD-10-CM | POA: Diagnosis not present

## 2023-12-24 DIAGNOSIS — Z806 Family history of leukemia: Secondary | ICD-10-CM | POA: Insufficient documentation

## 2023-12-24 DIAGNOSIS — F17220 Nicotine dependence, chewing tobacco, uncomplicated: Secondary | ICD-10-CM | POA: Insufficient documentation

## 2023-12-24 LAB — CMP (CANCER CENTER ONLY)
ALT: 13 U/L (ref 0–44)
AST: 23 U/L (ref 15–41)
Albumin: 4.3 g/dL (ref 3.5–5.0)
Alkaline Phosphatase: 98 U/L (ref 38–126)
Anion gap: 7 (ref 5–15)
BUN: 18 mg/dL (ref 8–23)
CO2: 23 mmol/L (ref 22–32)
Calcium: 9.1 mg/dL (ref 8.9–10.3)
Chloride: 105 mmol/L (ref 98–111)
Creatinine: 1.34 mg/dL — ABNORMAL HIGH (ref 0.61–1.24)
GFR, Estimated: 55 mL/min — ABNORMAL LOW (ref >60)
Glucose, Bld: 96 mg/dL (ref 70–99)
Potassium: 4.1 mmol/L (ref 3.5–5.1)
Sodium: 135 mmol/L (ref 135–145)
Total Bilirubin: 2.6 mg/dL — ABNORMAL HIGH (ref 0.0–1.2)
Total Protein: 6.9 g/dL (ref 6.5–8.1)

## 2023-12-24 LAB — CBC WITH DIFFERENTIAL (CANCER CENTER ONLY)
Abs Immature Granulocytes: 0.02 K/uL (ref 0.00–0.07)
Basophils Absolute: 0.1 K/uL (ref 0.0–0.1)
Basophils Relative: 1 %
Eosinophils Absolute: 0 K/uL (ref 0.0–0.5)
Eosinophils Relative: 0 %
HCT: 29.2 % — ABNORMAL LOW (ref 39.0–52.0)
Hemoglobin: 10.8 g/dL — ABNORMAL LOW (ref 13.0–17.0)
Immature Granulocytes: 0 %
Lymphocytes Relative: 18 %
Lymphs Abs: 1.3 K/uL (ref 0.7–4.0)
MCH: 37.9 pg — ABNORMAL HIGH (ref 26.0–34.0)
MCHC: 37 g/dL — ABNORMAL HIGH (ref 30.0–36.0)
MCV: 102.5 fL — ABNORMAL HIGH (ref 80.0–100.0)
Monocytes Absolute: 0.7 K/uL (ref 0.1–1.0)
Monocytes Relative: 10 %
Neutro Abs: 4.9 K/uL (ref 1.7–7.7)
Neutrophils Relative %: 71 %
Platelet Count: 318 K/uL (ref 150–400)
RBC: 2.85 MIL/uL — ABNORMAL LOW (ref 4.22–5.81)
RDW: 16.1 % — ABNORMAL HIGH (ref 11.5–15.5)
WBC Count: 6.9 K/uL (ref 4.0–10.5)
nRBC: 0 % (ref 0.0–0.2)

## 2023-12-24 NOTE — Progress Notes (Unsigned)
 Dr. Melanee asked if the 3 month labs (cbc w/ diff, cmp, and haptoglogin) can be done at facility. Outbound call to [facility]  [phone]; spoke to Harlene who indicated if they receive a letter indicating what labs to be drawn they can accommodate the 3 month labs.  Orders can be faxed to [phone].  Patient has already left clinic; fax sent and confirmation received. Called back [facility] [place], Harlene not available relayed message to coordinator indicating fax sent requesting labs be drawn in 3 months.  She indicated she will let Harlene know.

## 2023-12-25 LAB — HAPTOGLOBIN: Haptoglobin: <10 mg/dL — ABNORMAL LOW (ref 34–355)

## 2023-12-25 NOTE — Progress Notes (Signed)
 Hematology/Oncology Consult note [facility]  [phone] [phone]  Patient Care Team: Eilleen Query, MD as PCP - General (Geriatric Medicine) Darliss Rogue, MD as PCP - Cardiology (Cardiology) Eilleen Query, MD as Referring Physician (Geriatric Medicine) Rand, Eloise PEDLAR, NP as Nurse Practitioner (Nurse Practitioner) Melanee Annah BROCKS, MD as Consulting Physician (Oncology)   Name of the patient: Philip Gordon  [id number]  [birth year]   Date of visit: 12/25/23  Diagnosis-  history of cold agglutinin hemolytic anemia   Chief complaint/ Reason for visit-routine follow-up of cold agglutinin hemolytic anemia presently under observation  Heme/Onc history:  Patient is a 73 year old male with history of cold agglutinin hemolytic anemia who was last seen by me as an outpatient in September 2019. His hemoglobin at that time was stable around 10 and he has never required any treatment for cold agglutinin disease. He has had a workup for cold agglutinin including negative ANA, normal C3-C4 as well as negative hepatitis B and C testing. Myeloma panel did not reveal any M protein. Rheumatoid factor testing at that time was negative. CT chest abdomen pelvis done back in 2018 did not reveal any evidence of lymphoproliferative disorder. Subsequently he was being followed by his primary care doctor and recently he was admitted to the hospital for low hemoglobin. His hemoglobin which was 9.1 a year ago had dropped down to 7.1 this year. Patient was given blood transfusion and workup for cold agglutinin hemolytic anemia was positive. Coombs test was positive for complement and negative for IgG. Cold agglutinin titers wereHigh and positive at 1: 4096. Iron studies were indicative of anemia of chronic disease B12 and folate levels were normal. CT chest abdomen pelvis with contrast in August 2024 did not reveal any evidence of malignancy. Incidental subsegmental  PE noted at that time for which he was on Eliquis  briefly and then stopped   Interval history-no acute issues at his nursing home.  No recent hospitalizations.  He has baseline fatigue  ECOG PS- 3 Pain scale- 0   Review of systems- Review of Systems  Constitutional:  Positive for malaise/fatigue. Negative for chills, fever and weight loss.  HENT:  Negative for congestion, ear discharge and nosebleeds.   Eyes:  Negative for blurred vision.  Respiratory:  Negative for cough, hemoptysis, sputum production, shortness of breath and wheezing.   Cardiovascular:  Negative for chest pain, palpitations, orthopnea and claudication.  Gastrointestinal:  Negative for abdominal pain, blood in stool, constipation, diarrhea, heartburn, melena, nausea and vomiting.  Genitourinary:  Negative for dysuria, flank pain, frequency, hematuria and urgency.  Musculoskeletal:  Negative for back pain, joint pain and myalgias.  Skin:  Negative for rash.  Neurological:  Negative for dizziness, tingling, focal weakness, seizures, weakness and headaches.  Endo/Heme/Allergies:  Does not bruise/bleed easily.  Psychiatric/Behavioral:  Negative for depression and suicidal ideas. The patient does not have insomnia.       Allergies  Allergen Reactions   Gabapentin     Other reaction(s): Dizziness   Pregabalin      Other reaction(s): Dizziness     Past Medical History:  Diagnosis Date   Depression    Hypercholesterolemia    Hypertension    Multiple sclerosis    Sees Dr Damien Hives [facility])   Prostate cancer [facility])    Followed by Dr Ike and Dr Wilder     Past Surgical History:  Procedure Laterality Date   KNEE SURGERY      Social History   Socioeconomic  History   Marital status: Divorced    Spouse name: Not on file   Number of children: Not on file   Years of education: Not on file   Highest education level: Not on file  Occupational History   Not on file  Tobacco Use   Smoking status: Never    Smokeless tobacco: Current    Types: Chew   Tobacco comments:    Is going to try and cut down on the amount.   Vaping Use   Vaping status: Never Used  Substance and Sexual Activity   Alcohol  use: No    Alcohol /week: 2.0 standard drinks of alcohol     Types: 2 Cans of beer per week   Drug use: No   Sexual activity: Not Currently  Other Topics Concern   Not on file  Social History Narrative   Not on file   Social Drivers of Health   Financial Resource Strain: Not on file  Food Insecurity: No Food Insecurity (10/26/2022)   Hunger Vital Sign    Worried About Running Out of Food in the Last Year: Never true    Ran Out of Food in the Last Year: Never true  Transportation Needs: No Transportation Needs (10/26/2022)   PRAPARE - Administrator, Civil Service (Medical): No    Lack of Transportation (Non-Medical): No  Physical Activity: Not on file  Stress: Not on file  Social Connections: Not on file  Intimate Partner Violence: Not At Risk (10/26/2022)   Humiliation, Afraid, Rape, and Kick questionnaire    Fear of Current or Ex-Partner: No    Emotionally Abused: No    Physically Abused: No    Sexually Abused: No    Family History  Problem Relation Age of Onset   Hypertension Mother    Stroke Father    Diabetes Maternal Grandmother    Diabetes Maternal Grandfather    Leukemia Sister        died - age 32     Current Outpatient Medications:    acetaminophen  (TYLENOL ) 325 MG tablet, Take by mouth every 6 (six) hours as needed. As needed for pain, Disp: , Rfl:    ALPRAZolam (XANAX) 0.5 MG tablet, 3 (three) times a day as needed., Disp: , Rfl:    amLODipine  (NORVASC ) 5 MG tablet, Take by mouth., Disp: , Rfl:    cetirizine (ZYRTEC) 10 MG tablet, Take 10 mg by mouth daily., Disp: , Rfl:    fenofibrate 54 MG tablet, Take 54 mg by mouth daily., Disp: , Rfl:    finasteride  (PROSCAR ) 5 MG tablet, Take 1 tablet (5 mg total) by mouth daily., Disp: 90 tablet, Rfl: 3   fluticasone   (FLONASE ) 50 MCG/ACT nasal spray, Place 2 sprays into both nostrils daily., Disp: 16 g, Rfl: 6   latanoprost (XALATAN) 0.005 % ophthalmic solution, Place 1 drop into both eyes at bedtime., Disp: , Rfl:    lidocaine  (LIDODERM ) 5 %, Place 1 patch onto the skin daily. Remove & Discard patch within 12 hours or as directed by MD, Disp: , Rfl:    melatonin 3 MG TABS tablet, Take 3 mg by mouth at bedtime., Disp: , Rfl:    midodrine  (PROAMATINE ) 5 MG tablet, Take 1 tablet (5 mg total) by mouth 3 (three) times daily as needed (SBP <100)., Disp: , Rfl:    mirtazapine  (REMERON ) 15 MG tablet, Take 15 mg by mouth at bedtime., Disp: , Rfl:    Multiple Vitamin (MULTIVITAMIN) capsule, Take 1 capsule  by mouth daily., Disp: , Rfl:    Polyethyl Glycol-Propyl Glycol (SYSTANE) 0.4-0.3 % SOLN, Place 1 drop into both eyes 2 (two) times daily in the am and at bedtime.., Disp: , Rfl:    sertraline  (ZOLOFT ) 25 MG tablet, Take 1 tablet (25 mg total) by mouth daily., Disp: 90 tablet, Rfl: 1   tiZANidine  (ZANAFLEX ) 2 MG tablet, Take 2 mg by mouth at bedtime., Disp: , Rfl:    apixaban  (ELIQUIS ) 5 MG TABS tablet, Take 5 mg by mouth 2 (two) times daily. (Patient not taking: Reported on 12/24/2023), Disp: , Rfl:    dextromethorphan (DELSYM) 30 MG/5ML liquid, Take 10 mLs by mouth every 12 (twelve) hours as needed (cough). (Patient not taking: Reported on 12/24/2023), Disp: , Rfl:    naloxone (NARCAN) nasal spray 4 mg/0.1 mL, Place 1 spray into the nose 3 (three) times daily as needed. (Patient not taking: Reported on 12/24/2023), Disp: , Rfl:    vitamin B-12 (CYANOCOBALAMIN ) 1000 MCG tablet, Take 1 tablet (1,000 mcg total) by mouth daily. (Patient not taking: Reported on 12/24/2023), Disp: 90 tablet, Rfl: 3  Physical exam:  Vitals:   12/24/23 1330  BP: 136/63  Pulse: (!) 59  Resp: 20  Temp: 97.8 F (36.6 C)  SpO2: 100%   Physical Exam Constitutional:      Comments: Elderly frail gentleman sitting in a wheelchair.  Appears in  no acute distress  Cardiovascular:     Rate and Rhythm: Normal rate and regular rhythm.     Heart sounds: Normal heart sounds.  Pulmonary:     Effort: Pulmonary effort is normal.     Breath sounds: Normal breath sounds.  Skin:    General: Skin is warm and dry.  Neurological:     Mental Status: He is alert and oriented to person, place, and time.      I have personally reviewed labs listed below:    Latest Ref Rng & Units 12/24/2023   12:35 PM  CMP  Glucose 70 - 99 mg/dL 96   BUN 8 - 23 mg/dL 18   Creatinine 9.38 - 1.24 mg/dL 8.65   Sodium 864 - 854 mmol/L 135   Potassium 3.5 - 5.1 mmol/L 4.1   Chloride 98 - 111 mmol/L 105   CO2 22 - 32 mmol/L 23   Calcium  8.9 - 10.3 mg/dL 9.1   Total Protein 6.5 - 8.1 g/dL 6.9   Total Bilirubin 0.0 - 1.2 mg/dL 2.6   Alkaline Phos 38 - 126 U/L 98   AST 15 - 41 U/L 23   ALT 0 - 44 U/L 13       Latest Ref Rng & Units 12/24/2023   12:35 PM  CBC  WBC 4.0 - 10.5 K/uL 6.9   Hemoglobin 13.0 - 17.0 g/dL 89.1   Hematocrit 60.9 - 52.0 % 29.2   Platelets 150 - 400 K/uL 318       Assessment and plan- Patient is a 73 y.o. male here for routine follow-up of cold agglutinin hemolytic anemia  Patient's hemoglobin is presently stable around 10.8 which is his baseline.  CMP shows a total bilirubin of 2.6 and he has chronic hemolysis due to which his bilirubin tends to run between 1.5-3.  Hemolytic anemia does not require any treatment at this time which can be monitored conservatively.  I have reemphasized the importance of staying warm especially in cold weather to prevent worsening hemolysis.  CBC with differential CMP and haptoglobin in 3 and 6  months and I will see her in 6 months   Visit Diagnosis 1. Hemolytic anemia due to cold antibody [facility])      Dr. Annah Skene, MD, MPH [facility] [phone] 12/25/2023 9:20 AM

## 2023-12-26 LAB — COLD AGGLUTININ TITER: Cold Agglutinin Titer: 1:1024 {titer} — ABNORMAL HIGH

## 2024-02-11 NOTE — Telephone Encounter (Signed)
 open in error

## 2024-03-09 ENCOUNTER — Telehealth: Payer: Self-pay

## 2024-03-09 NOTE — Telephone Encounter (Signed)
 Outbound call to make sure 3 month labs can be accommodated at [facility]. Answering party attempted to page nurse, hold time > 5 min; representative returned to line and advised I call back later to see if a nurse is available

## 2024-03-10 NOTE — Telephone Encounter (Signed)
 Outbound call to [facility]; hold time > 5 minutes. Patient has LOV scheduled 03/24/24 at 1:00pm, will plan to keep appointment as scheduled at the center.

## 2024-03-24 ENCOUNTER — Inpatient Hospital Stay: Attending: Oncology

## 2024-03-24 DIAGNOSIS — D5912 Cold autoimmune hemolytic anemia: Secondary | ICD-10-CM | POA: Diagnosis present

## 2024-03-24 LAB — CBC WITH DIFFERENTIAL (CANCER CENTER ONLY)
Abs Immature Granulocytes: 0.04 K/uL (ref 0.00–0.07)
Basophils Absolute: 0.1 K/uL (ref 0.0–0.1)
Basophils Relative: 1 %
Eosinophils Absolute: 0.1 K/uL (ref 0.0–0.5)
Eosinophils Relative: 1 %
HCT: 30.3 % — ABNORMAL LOW (ref 39.0–52.0)
Hemoglobin: 11 g/dL — ABNORMAL LOW (ref 13.0–17.0)
Immature Granulocytes: 1 %
Lymphocytes Relative: 23 %
Lymphs Abs: 1.7 K/uL (ref 0.7–4.0)
MCH: 36.9 pg — ABNORMAL HIGH (ref 26.0–34.0)
MCHC: 36.3 g/dL — ABNORMAL HIGH (ref 30.0–36.0)
MCV: 101.7 fL — ABNORMAL HIGH (ref 80.0–100.0)
Monocytes Absolute: 0.7 K/uL (ref 0.1–1.0)
Monocytes Relative: 10 %
Neutro Abs: 4.6 K/uL (ref 1.7–7.7)
Neutrophils Relative %: 64 %
Platelet Count: 350 K/uL (ref 150–400)
RBC: 2.98 MIL/uL — ABNORMAL LOW (ref 4.22–5.81)
RDW: 16.6 % — ABNORMAL HIGH (ref 11.5–15.5)
WBC Count: 7.1 K/uL (ref 4.0–10.5)
nRBC: 0 % (ref 0.0–0.2)

## 2024-03-24 LAB — CMP (CANCER CENTER ONLY)
ALT: 28 U/L (ref 0–44)
AST: 30 U/L (ref 15–41)
Albumin: 4.6 g/dL (ref 3.5–5.0)
Alkaline Phosphatase: 114 U/L (ref 38–126)
Anion gap: 11 (ref 5–15)
BUN: 17 mg/dL (ref 8–23)
CO2: 25 mmol/L (ref 22–32)
Calcium: 9.7 mg/dL (ref 8.9–10.3)
Chloride: 103 mmol/L (ref 98–111)
Creatinine: 1.31 mg/dL — ABNORMAL HIGH (ref 0.61–1.24)
GFR, Estimated: 57 mL/min — ABNORMAL LOW
Glucose, Bld: 83 mg/dL (ref 70–99)
Potassium: 4.1 mmol/L (ref 3.5–5.1)
Sodium: 139 mmol/L (ref 135–145)
Total Bilirubin: 2 mg/dL — ABNORMAL HIGH (ref 0.0–1.2)
Total Protein: 7.4 g/dL (ref 6.5–8.1)

## 2024-03-25 LAB — HAPTOGLOBIN: Haptoglobin: <10 mg/dL — ABNORMAL LOW (ref 34–355)

## 2024-04-08 ENCOUNTER — Ambulatory Visit: Admitting: Cardiology

## 2024-05-11 ENCOUNTER — Ambulatory Visit: Admitting: Cardiology

## 2024-06-23 ENCOUNTER — Ambulatory Visit: Admitting: Oncology

## 2024-06-23 ENCOUNTER — Other Ambulatory Visit
